# Patient Record
Sex: Female | Born: 1940 | Race: White | Hispanic: No | Marital: Married | Smoking: Never smoker
Health system: Southern US, Community
[De-identification: ages and names within clinical notes are randomized; demographics above are authoritative.]

## PROBLEM LIST (undated history)

## (undated) DIAGNOSIS — M353 Polymyalgia rheumatica: Secondary | ICD-10-CM

## (undated) DIAGNOSIS — Z8541 Personal history of malignant neoplasm of cervix uteri: Secondary | ICD-10-CM

## (undated) DIAGNOSIS — D539 Nutritional anemia, unspecified: Secondary | ICD-10-CM

## (undated) DIAGNOSIS — I70212 Atherosclerosis of native arteries of extremities with intermittent claudication, left leg: Secondary | ICD-10-CM

## (undated) DIAGNOSIS — N893 Dysplasia of vagina, unspecified: Secondary | ICD-10-CM

## (undated) DIAGNOSIS — I700 Atherosclerosis of aorta: Secondary | ICD-10-CM

## (undated) DIAGNOSIS — B977 Papillomavirus as the cause of diseases classified elsewhere: Secondary | ICD-10-CM

## (undated) DIAGNOSIS — M47812 Spondylosis without myelopathy or radiculopathy, cervical region: Secondary | ICD-10-CM

## (undated) DIAGNOSIS — I998 Other disorder of circulatory system: Secondary | ICD-10-CM

## (undated) DIAGNOSIS — H04129 Dry eye syndrome of unspecified lacrimal gland: Secondary | ICD-10-CM

## (undated) DIAGNOSIS — E079 Disorder of thyroid, unspecified: Secondary | ICD-10-CM

## (undated) DIAGNOSIS — K529 Noninfective gastroenteritis and colitis, unspecified: Secondary | ICD-10-CM

## (undated) DIAGNOSIS — Z7901 Long term (current) use of anticoagulants: Secondary | ICD-10-CM

## (undated) DIAGNOSIS — R7302 Impaired glucose tolerance (oral): Secondary | ICD-10-CM

## (undated) DIAGNOSIS — R7401 Elevation of levels of liver transaminase levels: Secondary | ICD-10-CM

## (undated) DIAGNOSIS — R911 Solitary pulmonary nodule: Secondary | ICD-10-CM

## (undated) DIAGNOSIS — C4490 Unspecified malignant neoplasm of skin, unspecified: Secondary | ICD-10-CM

## (undated) DIAGNOSIS — Z79899 Other long term (current) drug therapy: Secondary | ICD-10-CM

## (undated) DIAGNOSIS — E785 Hyperlipidemia, unspecified: Secondary | ICD-10-CM

## (undated) DIAGNOSIS — C44301 Unspecified malignant neoplasm of skin of nose: Secondary | ICD-10-CM

## (undated) DIAGNOSIS — M1990 Unspecified osteoarthritis, unspecified site: Secondary | ICD-10-CM

## (undated) DIAGNOSIS — E039 Hypothyroidism, unspecified: Secondary | ICD-10-CM

## (undated) DIAGNOSIS — M810 Age-related osteoporosis without current pathological fracture: Secondary | ICD-10-CM

## (undated) DIAGNOSIS — Z955 Presence of coronary angioplasty implant and graft: Secondary | ICD-10-CM

## (undated) DIAGNOSIS — Z7982 Long term (current) use of aspirin: Secondary | ICD-10-CM

## (undated) DIAGNOSIS — I70222 Atherosclerosis of native arteries of extremities with rest pain, left leg: Secondary | ICD-10-CM

## (undated) DIAGNOSIS — I70229 Atherosclerosis of native arteries of extremities with rest pain, unspecified extremity: Secondary | ICD-10-CM

## (undated) DIAGNOSIS — I739 Peripheral vascular disease, unspecified: Secondary | ICD-10-CM

## (undated) DIAGNOSIS — I2510 Atherosclerotic heart disease of native coronary artery without angina pectoris: Secondary | ICD-10-CM

## (undated) DIAGNOSIS — I872 Venous insufficiency (chronic) (peripheral): Secondary | ICD-10-CM

## (undated) HISTORY — PX: OTHER SURGICAL HISTORY: SHX169

## (undated) HISTORY — DX: Dry eye syndrome of unspecified lacrimal gland: H04.129

## (undated) HISTORY — DX: Personal history of malignant neoplasm of cervix uteri: Z85.41

## (undated) HISTORY — DX: Unspecified osteoarthritis, unspecified site: M19.90

## (undated) HISTORY — DX: Age-related osteoporosis without current pathological fracture: M81.0

## (undated) HISTORY — DX: Papillomavirus as the cause of diseases classified elsewhere: B97.7

## (undated) HISTORY — DX: Venous insufficiency (chronic) (peripheral): I87.2

## (undated) HISTORY — DX: Dysplasia of vagina, unspecified: N89.3

## (undated) HISTORY — PX: ABDOMINAL HYSTERECTOMY: SHX81

## (undated) HISTORY — DX: Presence of coronary angioplasty implant and graft: Z95.5

## (undated) HISTORY — DX: Unspecified malignant neoplasm of skin, unspecified: C44.90

## (undated) HISTORY — DX: Long term (current) use of anticoagulants: Z79.01

## (undated) HISTORY — DX: Other disorder of circulatory system: I99.8

## (undated) HISTORY — PX: LAPAROSCOPIC HYSTERECTOMY: SHX1926

## (undated) HISTORY — PX: TOTAL ABDOMINAL HYSTERECTOMY: SHX209

## (undated) HISTORY — DX: Polymyalgia rheumatica: M35.3

## (undated) HISTORY — PX: COLPOSCOPY: SHX161

## (undated) HISTORY — DX: Atherosclerosis of native arteries of extremities with rest pain, unspecified extremity: I70.229

## (undated) HISTORY — DX: Hyperlipidemia, unspecified: E78.5

## (undated) HISTORY — DX: Disorder of thyroid, unspecified: E07.9

## (undated) HISTORY — DX: Atherosclerotic heart disease of native coronary artery without angina pectoris: I25.10

## (undated) HISTORY — PX: KNEE ARTHROSCOPY: SUR90

## (undated) SURGERY — Surgical Case
Anesthesia: *Unknown

---

## 2015-04-17 DIAGNOSIS — K52832 Lymphocytic colitis: Secondary | ICD-10-CM | POA: Insufficient documentation

## 2015-04-17 DIAGNOSIS — K52831 Collagenous colitis: Secondary | ICD-10-CM | POA: Insufficient documentation

## 2015-04-17 DIAGNOSIS — M353 Polymyalgia rheumatica: Secondary | ICD-10-CM | POA: Insufficient documentation

## 2015-04-17 DIAGNOSIS — E785 Hyperlipidemia, unspecified: Secondary | ICD-10-CM | POA: Insufficient documentation

## 2015-04-17 DIAGNOSIS — M19011 Primary osteoarthritis, right shoulder: Secondary | ICD-10-CM | POA: Insufficient documentation

## 2015-04-26 DIAGNOSIS — Z8619 Personal history of other infectious and parasitic diseases: Secondary | ICD-10-CM | POA: Insufficient documentation

## 2015-04-26 DIAGNOSIS — Z820 Family history of epilepsy and other diseases of the nervous system: Secondary | ICD-10-CM | POA: Insufficient documentation

## 2015-06-26 DIAGNOSIS — R7302 Impaired glucose tolerance (oral): Secondary | ICD-10-CM | POA: Insufficient documentation

## 2015-06-26 DIAGNOSIS — R87610 Atypical squamous cells of undetermined significance on cytologic smear of cervix (ASC-US): Secondary | ICD-10-CM | POA: Insufficient documentation

## 2016-07-18 DIAGNOSIS — G479 Sleep disorder, unspecified: Secondary | ICD-10-CM | POA: Insufficient documentation

## 2016-09-20 DIAGNOSIS — M810 Age-related osteoporosis without current pathological fracture: Secondary | ICD-10-CM | POA: Insufficient documentation

## 2019-04-07 DIAGNOSIS — R55 Syncope and collapse: Secondary | ICD-10-CM | POA: Insufficient documentation

## 2019-04-07 DIAGNOSIS — E034 Atrophy of thyroid (acquired): Secondary | ICD-10-CM | POA: Insufficient documentation

## 2019-10-13 DIAGNOSIS — I70229 Atherosclerosis of native arteries of extremities with rest pain, unspecified extremity: Secondary | ICD-10-CM | POA: Insufficient documentation

## 2019-10-29 DIAGNOSIS — I771 Stricture of artery: Secondary | ICD-10-CM | POA: Insufficient documentation

## 2019-10-29 DIAGNOSIS — I70212 Atherosclerosis of native arteries of extremities with intermittent claudication, left leg: Secondary | ICD-10-CM | POA: Insufficient documentation

## 2019-11-09 DIAGNOSIS — T8131XA Disruption of external operation (surgical) wound, not elsewhere classified, initial encounter: Secondary | ICD-10-CM | POA: Insufficient documentation

## 2020-03-24 DIAGNOSIS — S85002S Unspecified injury of popliteal artery, left leg, sequela: Secondary | ICD-10-CM | POA: Insufficient documentation

## 2020-03-24 DIAGNOSIS — N893 Dysplasia of vagina, unspecified: Secondary | ICD-10-CM | POA: Insufficient documentation

## 2020-04-06 ENCOUNTER — Telehealth: Payer: Self-pay | Admitting: *Deleted

## 2020-04-06 ENCOUNTER — Encounter: Payer: Self-pay | Admitting: *Deleted

## 2020-04-06 NOTE — Telephone Encounter (Signed)
Left vm for NEW patient to request for a return phone call. Patient has been scheduled for 7/28 at 9:30 am with [facility].

## 2020-04-07 ENCOUNTER — Encounter: Payer: Self-pay | Admitting: *Deleted

## 2020-04-07 NOTE — Telephone Encounter (Signed)
Spoke with patient. She is a transfer of care from [place]. Pt resides at [facility] with her husband. Apt given to see Dr. Johnnette Litter on 7/28 at 9:30am.   She has a personal history of cervical cancer and VAIN. She stated that she will have her records transferred to Dr. Sampson Goon. I have asked the patient to have a consent signed as well for her release of records.

## 2020-04-19 DIAGNOSIS — E039 Hypothyroidism, unspecified: Secondary | ICD-10-CM | POA: Insufficient documentation

## 2020-04-19 DIAGNOSIS — I739 Peripheral vascular disease, unspecified: Secondary | ICD-10-CM | POA: Insufficient documentation

## 2020-04-19 DIAGNOSIS — E785 Hyperlipidemia, unspecified: Secondary | ICD-10-CM | POA: Insufficient documentation

## 2020-04-19 NOTE — Progress Notes (Signed)
MRN : [id number]  Melanie Browning is a 79 y.o. (14-Nov-1940) female who presents with chief complaint of No chief complaint on file. Marland Kitchen  History of Present Illness:   She and her husband moved to [facility] recently and she is establishing care here.  The patient is seen for evaluation of painful lower extremities and diminished pulses. Patient notes the pain is always associated with activity and is very consistent day today. Typically, the pain occurs at less than one block, progress is as activity continues to the point that the patient must stop walking. Resting including standing still for several minutes allowed resumption of the activity and the ability to walk a similar distance before stopping again. Uneven terrain and inclined shorten the distance. The pain has been progressive over the past several years. The patient states the inability to walk is now having a profound negative impact on quality of life and daily activities.  She is s/p repair of her left popliteal artery after a failed angioplasty while she was living in [facility].  The patient denies rest pain or dangling of an extremity off the side of the bed during the night for relief. No open wounds or sores at this time. No prior interventions or surgeries.  No history of back problems or DJD of the lumbar sacral spine.   The patient denies changes in claudication symptoms or new rest pain symptoms.  No new ulcers or wounds of the foot.  The patient's blood pressure has been stable and relatively well controlled. The patient denies amaurosis fugax or recent TIA symptoms. There are no recent neurological changes noted. The patient denies history of DVT, PE or superficial thrombophlebitis. The patient denies recent episodes of angina or shortness of breath.   No outpatient medications have been marked as taking for the 04/20/20 encounter (Appointment) with Gilda Crease, Latina Craver, MD.    Past Medical History:  Diagnosis  Date  . Cervical cancer (HCC)   . HPV (human papilloma virus) infection   . Hyperlipidemia   . Osteoporosis   . Polymyalgia rheumatica (HCC)   . Thyroid disease   . Vaginal dysplasia       Social History Social History   Tobacco Use  . Smoking status: Not on file  Substance Use Topics  . Alcohol use: Not on file  . Drug use: Not on file    Family History No family history of bleeding/clotting disorders, porphyria or autoimmune disease   Not on File   REVIEW OF SYSTEMS (Negative unless checked)  Constitutional: [] Weight loss  [] Fever  [] Chills Cardiac: [] Chest pain   [] Chest pressure   [] Palpitations   [] Shortness of breath when laying flat   [] Shortness of breath with exertion. Vascular:  [x] Pain in legs with walking   [] Pain in legs at rest  [] History of DVT   [] Phlebitis   [] Swelling in legs   [] Varicose veins   [] Non-healing ulcers Pulmonary:   [] Uses home oxygen   [] Productive cough   [] Hemoptysis   [] Wheeze  [] COPD   [] Asthma Neurologic:  [] Dizziness   [] Seizures   [] History of stroke   [] History of TIA  [] Aphasia   [] Vissual changes   [] Weakness or numbness in arm   [] Weakness or numbness in leg Musculoskeletal:   [] Joint swelling   [] Joint pain   [] Low back pain Hematologic:  [] Easy bruising  [] Easy bleeding   [] Hypercoagulable state   [] Anemic Gastrointestinal:  [] Diarrhea   [] Vomiting  [] Gastroesophageal reflux/heartburn   [] Difficulty  swallowing. Genitourinary:  [] Chronic kidney disease   [] Difficult urination  [] Frequent urination   [] Blood in urine Skin:  [] Rashes   [] Ulcers  Psychological:  [] History of anxiety   []  History of major depression.  Physical Examination  There were no vitals filed for this visit. There is no height or weight on file to calculate BMI. Gen: WD/WN, NAD Head: [place]/AT, No temporalis wasting.  Ear/Nose/Throat: Hearing grossly intact, nares w/o erythema or drainage, poor dentition Eyes: PER, EOMI, sclera nonicteric.  Neck: Supple,  no masses.  No bruit or JVD.  Pulmonary:  Good air movement, clear to auscultation bilaterally, no use of accessory muscles.  Cardiac: RRR, normal S1, S2, no Murmurs. Vascular: scattered moderate varicosities present bilaterally.  Moderate to severe Lt > Rt venous stasis changes to the legs bilaterally.  1+ soft pitting edema. Vessel Right Left  Radial Palpable Palpable  PT Palpable Not Palpable  DP Palpable Trace Palpable  Gastrointestinal: soft, non-distended. No guarding/no peritoneal signs.  Musculoskeletal: M/S 5/5 throughout.  No deformity or atrophy.  Neurologic: CN 2-12 intact. Pain and light touch intact in extremities.  Symmetrical.  Speech is fluent. Motor exam as listed above. Psychiatric: Judgment intact, Mood & affect appropriate for pt's clinical situation. Dermatologic: Moderate venous rashes no ulcers noted.  No changes consistent with cellulitis.   CBC No results found for: WBC, HGB, HCT, MCV, PLT  BMET No results found for: NA, K, CL, CO2, GLUCOSE, BUN, CREATININE, CALCIUM, GFRNONAA, GFRAA CrCl cannot be calculated (No successful lab value found.).  COAG No results found for: INR, PROTIME  Radiology No results found.    Assessment/Plan 1. 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  The risks and benefits as well as the alternatives were discussed in detail with the patient.  All questions were answered.  Patient agrees to proceed and understands this could be a prelude to angiography and intervention.  The patient will follow up with me in the office to review the studies.   - VAS [place] LOWER EXTREMITY ARTERIAL DUPLEX; Future - VAS [place] ABI WITH/WO TBI; Future  2. Chronic venous insufficiency No surgery or intervention at this point in time.    I have had a long discussion with the patient regarding  venous insufficiency and why it  causes symptoms. I have discussed with the patient the chronic skin changes that accompany venous insufficiency and the long term sequela such as infection and ulceration.  Patient will begin wearing graduated compression stockings class 1 (20-30 mmHg) or compression wraps on a daily basis a prescription was given. The patient will put the stockings on first thing in the morning and removing them in the evening. The patient is instructed specifically not to sleep in the stockings.    In addition, behavioral modification including several periods of elevation of the lower extremities during the day will be continued. I have demonstrated that proper elevation is a position with the ankles at heart level.  The patient is instructed to begin routine exercise, especially walking on a daily basis  3. Mixed hyperlipidemia Continue statin as ordered and reviewed, no changes at this time   4. Hypothyroidism, unspecified type Continue hormone replacement medications as already ordered, these medications have been reviewed and there are no changes at this time.     Levora Dredge, MD  04/19/2020 4:03 PM

## 2020-04-20 ENCOUNTER — Ambulatory Visit (INDEPENDENT_AMBULATORY_CARE_PROVIDER_SITE_OTHER): Payer: Medicare Other | Admitting: Vascular Surgery

## 2020-04-20 ENCOUNTER — Other Ambulatory Visit: Payer: Self-pay

## 2020-04-20 ENCOUNTER — Encounter (INDEPENDENT_AMBULATORY_CARE_PROVIDER_SITE_OTHER): Payer: Self-pay | Admitting: Vascular Surgery

## 2020-04-20 VITALS — BP 130/69 | HR 73 | Resp 17 | Ht 68.0 in | Wt 147.0 lb

## 2020-04-20 DIAGNOSIS — E039 Hypothyroidism, unspecified: Secondary | ICD-10-CM | POA: Diagnosis not present

## 2020-04-20 DIAGNOSIS — I70212 Atherosclerosis of native arteries of extremities with intermittent claudication, left leg: Secondary | ICD-10-CM

## 2020-04-20 DIAGNOSIS — E782 Mixed hyperlipidemia: Secondary | ICD-10-CM | POA: Diagnosis not present

## 2020-04-20 DIAGNOSIS — I872 Venous insufficiency (chronic) (peripheral): Secondary | ICD-10-CM

## 2020-04-23 ENCOUNTER — Encounter (INDEPENDENT_AMBULATORY_CARE_PROVIDER_SITE_OTHER): Payer: Self-pay | Admitting: Vascular Surgery

## 2020-04-23 DIAGNOSIS — I872 Venous insufficiency (chronic) (peripheral): Secondary | ICD-10-CM | POA: Insufficient documentation

## 2020-04-25 NOTE — Progress Notes (Signed)
GYN ONC Consult [facility]  Telephone:([phone] [phone]  Patient Care Team: Mick Sell, MD as PCP - General (Infectious Diseases)   Name of the patient: Melanie Browning  [id number]  06-21-1941   Date of visit: 04/26/2020   Gynecologic Oncology Consult Visit   Referring Provider: Dr. Sampson Goon   Chief Concern: Vaginal Dysplasia  Subjective:  Melanie Browning is a 79 y.o. G1P1 female who is seen in consultation from Dr. Sampson Goon for vaginal dysplasia. She is s/p TAH-BSO on 09/05/2015, but final surgical path was negative for dysplasia. After surgery, she was closely monitored every 6 months with colposcopies and PAPs.   Today she reports ongoing vaginal odor.   Gynecology Oncology History  She was treated in [place] for cervical and vaginal dysplasia from 2016 to 08/2019. She was referred to our clinic after her last visit at [facility] in [place].   TAH-BSO 09/05/2015  PAP 08/2016 LGSIL  PAP 02/09/2017 HSIL Colposcopy 02/2017 VAIN 1 PAP 05/2017 NILM 10/30/2017 ASCUS, HPV 16+ Colposcopy 11/26/2017 VAIN 1 Colposcopy 03/17/2018 No obvious disease noted (as per note from Dr. Pernell Dupre on 09/28/2019)  PAP 03/25/2019 LSIL/HPV+ Colposcopy with biopsy 04/20/2019 VAIN 1  PAP 09/21/2019, patient reports VAIN 1, HPV+   Problem List: Patient Active Problem List   Diagnosis Date Noted  . Chronic venous insufficiency 04/23/2020  . PAD (peripheral artery disease) (HCC) 04/19/2020  . Hyperlipidemia 04/19/2020  . Hypothyroidism 04/19/2020  . Popliteal artery injury, left, 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  . 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 Disruption of external operation (surgical) wound, not elsewhere classified, initial encounter 11/09/2019  . 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 Stricture, artery (HCC) 10/29/2019  . Critical lower limb ischemia 10/13/2019  . Hypothyroidism due  to acquired atrophy of thyroid 04/07/2019  . Syncope and collapse 04/07/2019  . Senile osteoporosis 09/20/2016  . Sleep disorder 07/18/2016  . IGT (impaired glucose tolerance) 06/26/2015  . Pap smear abnormality of cervix with ASCUS favoring dysplasia 06/26/2015  . Family history of Guillain-Barre syndrome 04/26/2015  . History of herpes genitalis 04/26/2015  . History of HPV infection 04/26/2015  . Dyslipidemia 04/17/2015  . 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 PMR (polymyalgia rheumatica) (HCC) 04/17/2015  . Arthritis of shoulder region, 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   Past Medical History: Past Medical History:  Diagnosis Date  . Cervical cancer (HCC)   . HPV (human papilloma virus) infection   . Hyperlipidemia   . Osteoporosis   . Polymyalgia rheumatica (HCC)   . Thyroid disease   . Vaginal dysplasia    Past Surgical History: Past Surgical History:  Procedure Laterality Date  . colposcobpy    . knee athroscopy Left   . LAPAROSCOPIC HYSTERECTOMY     Past Gynecologic History:  Menarche: 14 Menstrual details: Lasts 3 days Menses regular: yes Last Menstrual Period: Unknown History of OCP/HRT use: No History of Abnormal pap: yes, as per HPI Last pap: 08/2019 History of STDs: The patient reports a past history of: herpes. Sexually active: not asked  OB History:  OB History  No obstetric history on file.   Family History: Family History  Problem Relation Age of Onset  . Cancer Mother   . Heart disease Father    Social History: Social History   Socioeconomic History  . Marital status: Married    Spouse name: Not on file  . Number of children: Not on file  . Years of education: Not on file  .  Highest education level: Not on file  Occupational History  . Not on file  Tobacco Use  . Smoking status: Never Smoker  . Smokeless tobacco: Never Used  Substance and Sexual Activity  . Alcohol use: Not on file  . Drug use: Not on file  . Sexual activity: Not on file  Other  Topics Concern  . Not on file  Social History Narrative  . Not on file   Social Determinants of Health   Financial Resource Strain:   . Difficulty of Paying Living Expenses:   Food Insecurity:   . Worried About Programme researcher, broadcasting/film/video in the Last Year:   . Barista in the Last Year:   Transportation Needs:   . Freight forwarder (Medical):   Marland Kitchen Lack of Transportation (Non-Medical):   Physical Activity:   . Days of Exercise per Week:   . Minutes of Exercise per Session:   Stress:   . Feeling of Stress :   Social Connections:   . Frequency of Communication with Friends and Family:   . Frequency of Social Gatherings with Friends and Family:   . Attends Religious Services:   . Active Member of Clubs or Organizations:   . Attends Banker Meetings:   Marland Kitchen Marital Status:   Intimate Partner Violence:   . Fear of Current or Ex-Partner:   . Emotionally Abused:   Marland Kitchen Physically Abused:   . Sexually Abused:     Allergies: Allergies  Allergen Reactions  . Lactose     Other reaction(s): GI Upset    Current Medications: Current Outpatient Medications  Medication Sig Dispense Refill  . ascorbic acid (VITAMIN C) 1000 MG tablet Take by mouth.    Marland Kitchen atorvastatin (LIPITOR) 40 MG tablet Take by mouth.    . B Complex Vitamins (VITAMIN B COMPLEX) TABS Take by mouth.    Marland Kitchen BABY ASPIRIN PO Take by mouth.    . Budesonide ER 9 MG CP24 Take by mouth.    . Cholecalciferol 125 MCG (5000 UT) capsule Take by mouth.    . diphenoxylate-atropine (LOMOTIL) 2.5-0.025 MG/5ML liquid Take by mouth.    . gabapentin (NEURONTIN) 100 MG capsule Take by mouth.    . levothyroxine (SYNTHROID) 75 MCG tablet Take by mouth.    Marland Kitchen Lifitegrast (XIIDRA) 5 % SOLN Apply to eye.    . Naproxen Sodium 220 MG CAPS Take by mouth.    . Zinc Sulfate (ZINC 15 PO) Take 15 mg/day by mouth.     No current facility-administered medications for this visit.    Review of Systems General: positive for 7 pound  weight gain over 1 month, negative for, fevers, chills, fatigue, changes in sleep Skin: negative for changes in color, texture, moles or lesions Eyes: negative for, changes in vision, pain, diplopia HEENT: negative for, change in hearing, pain, discharge, tinnitus, vertigo, voice changes, sore throat, neck masses Breasts: negative for breast lumps Pulmonary: negative for, dyspnea, orthopnea, productive cough Cardiac: negative for, palpitations, syncope, pain, discomfort, pressure Gastrointestinal: negative for, dysphagia, nausea, vomiting, jaundice, pain, constipation, diarrhea, hematemesis, hematochezia Genitourinary/Sexual: negative for, dysuria, discharge, hesitancy, nocturia, retention, stones, infections, STD's, incontinence Ob/Gyn: negative for, irregular bleeding, pain Musculoskeletal: negative for, pain, stiffness, swelling, range of motion limitation Hematology: negative for, easy bruising, bleeding; positive for vaginal odor, reports using Replense  Neurologic/Psych: negative for, headaches, seizures, paralysis, weakness, tremor, change in gait, change in sensation, mood swings, depression, anxiety, change in memory  Objective:  Physical Examination:  BP (!) 133/82   Pulse 64   Temp 98.1 F (36.7 C) (Oral)   Resp 16   Wt 61.2 kg   BMI 20.53 kg/m    ECOG Performance Status: 1 - Symptomatic but completely ambulatory  General appearance: alert, cooperative and appears stated age HEENT:PERRLA and neck supple with midline trachea Lymph node survey: non-palpable, axillary, inguinal, supraclavicular Cardiovascular: regular rate and rhythm, no murmurs or gallops Respiratory: normal air entry, lungs clear to auscultation Breast exam: not examined Abdomen: soft, non-tender, without masses or organomegaly, no hernias and well healed incision Back: inspection of back is normal Extremities: extremities normal, atraumatic, no cyanosis or edema and healing popliteal vascular surgery  present over Left posterior knee Skin exam - normal coloration and turgor, no rashes, no suspicious skin lesions noted. Neurological exam reveals alert, oriented, normal speech, no focal findings or movement disorder noted.  Pelvic: exam chaperoned by nurse;  Vulva: atrophic normal appearing vulva with no masses, tenderness or lesions; Vagina: atrophic normal vagina; Bimanual: no masses. Rectal: deferred  Procedure" Colposcopy done with acetic acid after consent signed.  Atrophic vagina with no lesions seen.     Assessment:  Melanie Browning is a 79 y.o. 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 Asymptomatic and no lesions seen today on colposcopy.   TAH-BSO 09/05/2015  Medical co-morbidities complicating care: PAD, Dyslipidemia  Plan:   Problem List Items Addressed This Visit      Genitourinary   Pap smear abnormality of cervix with ASCUS favoring dysplasia - Primary     We discussed options for management including surveillance with HPV/PAP.  Suggested return to clinic in 6 months.    The patient's diagnosis, an outline of the further diagnostic and laboratory studies which will be required, the recommendation, and alternatives were discussed.  All questions were answered to the patient's satisfaction.  A total of 40 minutes were spent with the patient/family today; 50% was spent in education, counseling and coordination of care for VAIN.    Leida Lauth, MD

## 2020-04-26 ENCOUNTER — Encounter: Payer: Self-pay | Admitting: Obstetrics and Gynecology

## 2020-04-26 ENCOUNTER — Inpatient Hospital Stay: Payer: Medicare Other | Attending: Obstetrics and Gynecology | Admitting: Obstetrics and Gynecology

## 2020-04-26 ENCOUNTER — Other Ambulatory Visit: Payer: Self-pay

## 2020-04-26 VITALS — BP 133/82 | HR 64 | Temp 98.1°F | Resp 16 | Wt 135.0 lb

## 2020-04-26 DIAGNOSIS — Z90710 Acquired absence of both cervix and uterus: Secondary | ICD-10-CM | POA: Diagnosis not present

## 2020-04-26 DIAGNOSIS — N893 Dysplasia of vagina, unspecified: Secondary | ICD-10-CM | POA: Diagnosis not present

## 2020-04-26 DIAGNOSIS — Z90722 Acquired absence of ovaries, bilateral: Secondary | ICD-10-CM | POA: Diagnosis not present

## 2020-04-26 DIAGNOSIS — Z79899 Other long term (current) drug therapy: Secondary | ICD-10-CM | POA: Diagnosis not present

## 2020-04-26 DIAGNOSIS — I739 Peripheral vascular disease, unspecified: Secondary | ICD-10-CM | POA: Insufficient documentation

## 2020-04-26 DIAGNOSIS — E034 Atrophy of thyroid (acquired): Secondary | ICD-10-CM | POA: Diagnosis not present

## 2020-04-26 DIAGNOSIS — I872 Venous insufficiency (chronic) (peripheral): Secondary | ICD-10-CM | POA: Insufficient documentation

## 2020-04-26 DIAGNOSIS — E785 Hyperlipidemia, unspecified: Secondary | ICD-10-CM | POA: Insufficient documentation

## 2020-04-26 DIAGNOSIS — M353 Polymyalgia rheumatica: Secondary | ICD-10-CM | POA: Insufficient documentation

## 2020-04-26 NOTE — Progress Notes (Signed)
Vaginal odor

## 2020-05-08 ENCOUNTER — Encounter (INDEPENDENT_AMBULATORY_CARE_PROVIDER_SITE_OTHER): Payer: Self-pay

## 2020-05-11 ENCOUNTER — Other Ambulatory Visit: Payer: Self-pay

## 2020-05-11 ENCOUNTER — Ambulatory Visit (INDEPENDENT_AMBULATORY_CARE_PROVIDER_SITE_OTHER): Payer: Medicare Other

## 2020-05-11 ENCOUNTER — Ambulatory Visit (INDEPENDENT_AMBULATORY_CARE_PROVIDER_SITE_OTHER): Payer: Medicare Other | Admitting: Vascular Surgery

## 2020-05-11 ENCOUNTER — Encounter (INDEPENDENT_AMBULATORY_CARE_PROVIDER_SITE_OTHER): Payer: Self-pay | Admitting: Vascular Surgery

## 2020-05-11 VITALS — BP 150/77 | HR 64 | Resp 16 | Wt 150.4 lb

## 2020-05-11 DIAGNOSIS — I872 Venous insufficiency (chronic) (peripheral): Secondary | ICD-10-CM | POA: Diagnosis not present

## 2020-05-11 DIAGNOSIS — E782 Mixed hyperlipidemia: Secondary | ICD-10-CM | POA: Diagnosis not present

## 2020-05-11 DIAGNOSIS — I70212 Atherosclerosis of native arteries of extremities with intermittent claudication, left leg: Secondary | ICD-10-CM

## 2020-05-11 DIAGNOSIS — S85002S Unspecified injury of popliteal artery, left leg, sequela: Secondary | ICD-10-CM | POA: Diagnosis not present

## 2020-05-19 ENCOUNTER — Encounter (INDEPENDENT_AMBULATORY_CARE_PROVIDER_SITE_OTHER): Payer: Self-pay | Admitting: Vascular Surgery

## 2020-05-19 NOTE — Progress Notes (Signed)
MRN : [id number]  Melanie Browning is a 79 y.o. (12/03/1940) female who presents with chief complaint of  Chief Complaint  Patient presents with  . Follow-up    ultrasound follow up  .  History of Present Illness:   The patient returns to the office for followup and review of the noninvasive studies. There have been no interval changes in lower extremity symptoms. No interval shortening of the patient's claudication distance or development of rest pain symptoms. No new ulcers or wounds have occurred since the last visit.  There have been no significant changes to the patient's overall health care.  The patient denies amaurosis fugax or recent TIA symptoms. There are no recent neurological changes noted. The patient denies history of DVT, PE or superficial thrombophlebitis. The patient denies recent episodes of angina or shortness of breath.   ABI Rt=1.39 and Lt=1.26   Duplex ultrasound of the right leg shows widely patent arterial system no restenosis at the popliteal level  Current Meds  Medication Sig  . ascorbic acid (VITAMIN C) 1000 MG tablet Take 3,000 mg by mouth daily.   Marland Kitchen atorvastatin (LIPITOR) 40 MG tablet Take 40 mg by mouth daily.   . B Complex Vitamins (VITAMIN B COMPLEX) TABS Take 1 tablet by mouth daily.   Marland Kitchen BABY ASPIRIN PO Take 1 tablet by mouth daily.   . Budesonide ER 9 MG CP24 Take 1 tablet by mouth daily as needed.   . Cholecalciferol 125 MCG (5000 UT) capsule Take 5,000 Units by mouth daily.   Marland Kitchen denosumab (PROLIA) 60 MG/ML SOSY injection Inject 60 mg into the skin every 6 (six) months.  . diphenoxylate-atropine (LOMOTIL) 2.5-0.025 MG/5ML liquid Take 5 mLs by mouth 4 (four) times daily as needed.   . gabapentin (NEURONTIN) 100 MG capsule Take 100 mg by mouth 2 (two) times daily.   Marland Kitchen levothyroxine (SYNTHROID) 75 MCG tablet Take 75 mcg by mouth daily before breakfast.   . Lifitegrast (XIIDRA) 5 % SOLN Apply 1 drop to eye daily as needed.   . Naproxen Sodium 220 MG  CAPS Take 1 capsule by mouth daily as needed.   . Zinc Sulfate (ZINC 15 PO) Take 30 mg/day by mouth.     Past Medical History:  Diagnosis Date  . Arthritis   . Dry eye    bilateral when living in [facility]  . HPV (human papilloma virus) infection   . Hyperlipidemia   . Ischemia of left lower extremity   . Osteoporosis   . Polymyalgia rheumatica (HCC)   . Thyroid disease   . Vaginal dysplasia     Past Surgical History:  Procedure Laterality Date  . ABDOMINAL HYSTERECTOMY     complete  . colposcobpy    . knee athroscopy Left   . LAPAROSCOPIC HYSTERECTOMY      Social History Social History   Tobacco Use  . Smoking status: Never Smoker  . Smokeless tobacco: Never Used  Vaping Use  . Vaping Use: Never used  Substance Use Topics  . Alcohol use: Yes    Alcohol/week: 7.0 standard drinks    Types: 7 Glasses of wine per week    Comment: 1 glass of wine every day  . Drug use: Never    Family History Family History  Problem Relation Age of Onset  . Cancer Mother   . Heart disease Father     Allergies  Allergen Reactions  . Lactose     Other reaction(s): GI Upset Gi upset  and diarrhea Gi upset and diarrhea      REVIEW OF SYSTEMS (Negative unless checked)  Constitutional: [] Weight loss  [] Fever  [] Chills Cardiac: [] Chest pain   [] Chest pressure   [] Palpitations   [] Shortness of breath when laying flat   [] Shortness of breath with exertion. Vascular:  [] Pain in legs with walking   [] Pain in legs at rest  [] History of DVT   [] Phlebitis   [] Swelling in legs   [] Varicose veins   [] Non-healing ulcers Pulmonary:   [] Uses home oxygen   [] Productive cough   [] Hemoptysis   [] Wheeze  [] COPD   [] Asthma Neurologic:  [] Dizziness   [] Seizures   [] History of stroke   [] History of TIA  [] Aphasia   [] Vissual changes   [] Weakness or numbness in arm   [] Weakness or numbness in leg Musculoskeletal:   [] Joint swelling   [x] Joint pain   [] Low back pain Hematologic:  [] Easy bruising   [] Easy bleeding   [] Hypercoagulable state   [] Anemic Gastrointestinal:  [] Diarrhea   [] Vomiting  [] Gastroesophageal reflux/heartburn   [] Difficulty swallowing. Genitourinary:  [] Chronic kidney disease   [] Difficult urination  [] Frequent urination   [] Blood in urine Skin:  [] Rashes   [] Ulcers  Psychological:  [] History of anxiety   []  History of major depression.  Physical Examination  Vitals:   05/11/20 1436  BP: (!) 150/77  Pulse: 64  Resp: 16  Weight: 150 lb 6.4 oz (68.2 kg)   Body mass index is 22.87 kg/m. Gen: WD/WN, NAD Head: [place]/AT, No temporalis wasting.  Ear/Nose/Throat: Hearing grossly intact, nares w/o erythema or drainage Eyes: PER, EOMI, sclera nonicteric.  Neck: Supple, no large masses.   Pulmonary:  Good air movement, no audible wheezing bilaterally, no use of accessory muscles.  Cardiac: RRR, no JVD Vascular:  Vessel Right Left  Radial Palpable Palpable  PT Palpable Palpable  DP Palpable Palpable  Gastrointestinal: Non-distended. No guarding/no peritoneal signs.  Musculoskeletal: M/S 5/5 throughout.  No deformity or atrophy.  Neurologic: CN 2-12 intact. Symmetrical.  Speech is fluent. Motor exam as listed above. Psychiatric: Judgment intact, Mood & affect appropriate for pt's clinical situation. Dermatologic: No rashes or ulcers noted.  No changes consistent with cellulitis. Lymph : No lichenification or skin changes of chronic lymphedema.  CBC No results found for: WBC, HGB, HCT, MCV, PLT  BMET No results found for: NA, K, CL, CO2, GLUCOSE, BUN, CREATININE, CALCIUM, GFRNONAA, GFRAA CrCl cannot be calculated (No successful lab value found.).  COAG No results found for: INR, PROTIME  Radiology No results found.   Assessment/Plan 1. 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  The patient does not voice  lifestyle limiting changes at this point in time.  Noninvasive studies do not suggest clinically significant change.  No invasive studies, angiography or surgery at this time The patient should continue walking and begin a more formal exercise program.  The patient should continue antiplatelet therapy and aggressive treatment of the lipid abnormalities  No changes in the patient's medications at this time  - VAS [place] ABI WITH/WO TBI; Future  2. Popliteal artery injury, left, sequela  Recommend:  The patient has evidence of atherosclerosis of the lower extremities with claudication.  The patient does not voice lifestyle limiting changes at this point in time.  Noninvasive studies do not suggest clinically significant change.  No invasive studies, angiography or surgery at this time The  patient should continue walking and begin a more formal exercise program.  The patient should continue antiplatelet therapy and aggressive treatment of the lipid abnormalities  No changes in the patient's medications at this time  3. Mixed hyperlipidemia Continue statin as ordered and reviewed, no changes at this time   4. Chronic venous insufficiency No surgery or intervention at this point in time.  I have reviewed my discussion with the patient regarding venous insufficiency and why it causes symptoms. I have discussed with the patient the chronic skin changes that accompany venous insufficiency and the long term sequela such as ulceration. Patient will contnue wearing graduated compression stockings on a daily basis, as this has provided excellent control of his edema. The patient will put the stockings on first thing in the morning and removing them in the evening. The patient is reminded not to sleep in the stockings.  In addition, behavioral modification including elevation during the day will be initiated. Exercise is strongly encouraged.      Levora Dredge, MD  05/19/2020 5:16 PM

## 2020-07-27 ENCOUNTER — Other Ambulatory Visit (INDEPENDENT_AMBULATORY_CARE_PROVIDER_SITE_OTHER): Payer: Self-pay | Admitting: Vascular Surgery

## 2020-07-27 MED ORDER — LEVOFLOXACIN 500 MG PO TABS: 500.0000 mg | ORAL_TABLET | Freq: Every day | ORAL | 0 refills | Status: DC

## 2020-08-21 ENCOUNTER — Telehealth (INDEPENDENT_AMBULATORY_CARE_PROVIDER_SITE_OTHER): Payer: Self-pay

## 2020-08-21 NOTE — Telephone Encounter (Signed)
Janci nurse with [facility] left voicemail stating that the patient sent a email stating informing that she is having pain at left popliteal artery and having much pain while walking. Patient is requesting to be seen since she has had serious recurring medical for either 12 or 12/3. I spoke with Dr Gilda Crease and advise for the patient to come in for left arterial see himself or fallon. Patient has been schedule and the nurse will inform the patient with appointment.

## 2020-08-29 ENCOUNTER — Other Ambulatory Visit (INDEPENDENT_AMBULATORY_CARE_PROVIDER_SITE_OTHER): Payer: Self-pay | Admitting: Vascular Surgery

## 2020-08-29 DIAGNOSIS — M79605 Pain in left leg: Secondary | ICD-10-CM

## 2020-08-31 ENCOUNTER — Ambulatory Visit (INDEPENDENT_AMBULATORY_CARE_PROVIDER_SITE_OTHER): Payer: Medicare Other

## 2020-08-31 DIAGNOSIS — M79605 Pain in left leg: Secondary | ICD-10-CM | POA: Diagnosis not present

## 2020-09-01 ENCOUNTER — Other Ambulatory Visit
Admission: RE | Admit: 2020-09-01 | Discharge: 2020-09-01 | Disposition: A | Discharge status: Home / Self Care | Payer: Medicare Other | Source: Ambulatory Visit | Attending: Vascular Surgery | Admitting: Vascular Surgery

## 2020-09-01 ENCOUNTER — Other Ambulatory Visit: Payer: Self-pay

## 2020-09-01 ENCOUNTER — Telehealth (INDEPENDENT_AMBULATORY_CARE_PROVIDER_SITE_OTHER): Payer: Self-pay

## 2020-09-01 DIAGNOSIS — Z20822 Contact with and (suspected) exposure to covid-19: Secondary | ICD-10-CM | POA: Diagnosis not present

## 2020-09-01 DIAGNOSIS — Z01812 Encounter for preprocedural laboratory examination: Secondary | ICD-10-CM | POA: Insufficient documentation

## 2020-09-01 NOTE — Telephone Encounter (Signed)
Spoke with the patient and she is scheduled with Dr. Gilda Crease for a left leg angio with intervention on 09/05/20 with a 6:45 am arrival time to the [facility]. Covid testing is 09/01/20 between 8-1 pm at the [facility]. Pre-procedure instructions were discussed.

## 2020-09-02 LAB — SARS CORONAVIRUS 2 (TAT 6-24 HRS): SARS Coronavirus 2: NEGATIVE

## 2020-09-05 ENCOUNTER — Other Ambulatory Visit: Payer: Self-pay

## 2020-09-05 ENCOUNTER — Other Ambulatory Visit (INDEPENDENT_AMBULATORY_CARE_PROVIDER_SITE_OTHER): Payer: Self-pay | Admitting: Nurse Practitioner

## 2020-09-05 ENCOUNTER — Ambulatory Visit
Admission: RE | Admit: 2020-09-05 | Discharge: 2020-09-05 | Disposition: A | Discharge status: Home / Self Care | Payer: Medicare Other | Attending: Vascular Surgery | Admitting: Vascular Surgery

## 2020-09-05 ENCOUNTER — Encounter: Payer: Self-pay | Admitting: Vascular Surgery

## 2020-09-05 ENCOUNTER — Encounter
Admission: RE | Disposition: A | Discharge status: Home / Self Care | Payer: Self-pay | Source: Home / Self Care | Attending: Vascular Surgery

## 2020-09-05 DIAGNOSIS — I70219 Atherosclerosis of native arteries of extremities with intermittent claudication, unspecified extremity: Secondary | ICD-10-CM | POA: Diagnosis not present

## 2020-09-05 DIAGNOSIS — Z7982 Long term (current) use of aspirin: Secondary | ICD-10-CM | POA: Diagnosis not present

## 2020-09-05 DIAGNOSIS — Z79890 Hormone replacement therapy (postmenopausal): Secondary | ICD-10-CM | POA: Insufficient documentation

## 2020-09-05 DIAGNOSIS — I70213 Atherosclerosis of native arteries of extremities with intermittent claudication, bilateral legs: Secondary | ICD-10-CM | POA: Insufficient documentation

## 2020-09-05 DIAGNOSIS — Z79899 Other long term (current) drug therapy: Secondary | ICD-10-CM | POA: Insufficient documentation

## 2020-09-05 DIAGNOSIS — E079 Disorder of thyroid, unspecified: Secondary | ICD-10-CM | POA: Insufficient documentation

## 2020-09-05 DIAGNOSIS — I872 Venous insufficiency (chronic) (peripheral): Secondary | ICD-10-CM | POA: Insufficient documentation

## 2020-09-05 DIAGNOSIS — E782 Mixed hyperlipidemia: Secondary | ICD-10-CM | POA: Diagnosis not present

## 2020-09-05 DIAGNOSIS — I70212 Atherosclerosis of native arteries of extremities with intermittent claudication, left leg: Secondary | ICD-10-CM

## 2020-09-05 HISTORY — PX: LOWER EXTREMITY ANGIOGRAPHY: CATH118251

## 2020-09-05 LAB — BUN: BUN: 18 mg/dL (ref 8–23)

## 2020-09-05 LAB — CREATININE, SERUM
Creatinine, Ser: 0.61 mg/dL (ref 0.44–1.00)
GFR, Estimated: >60 mL/min (ref >60)

## 2020-09-05 SURGERY — LOWER EXTREMITY ANGIOGRAPHY
Anesthesia: Moderate Sedation | Laterality: Left

## 2020-09-05 MED ORDER — CLOPIDOGREL BISULFATE 75 MG PO TABS: 75.0000 mg | ORAL_TABLET | Freq: Every day | ORAL | 5 refills | Status: DC

## 2020-09-05 MED ORDER — FENTANYL CITRATE (PF) 100 MCG/2ML IJ SOLN
INTRAMUSCULAR | Status: AC
  Filled 2020-09-05: qty 2

## 2020-09-05 MED ORDER — OXYCODONE HCL 5 MG PO TABS: 5.0000 mg | ORAL_TABLET | ORAL | Status: DC | PRN

## 2020-09-05 MED ORDER — MORPHINE SULFATE (PF) 4 MG/ML IV SOLN: 2.0000 mg | INTRAVENOUS | Status: DC | PRN

## 2020-09-05 MED ORDER — HEPARIN SODIUM (PORCINE) 1000 UNIT/ML IJ SOLN
INTRAMUSCULAR | Status: AC
  Filled 2020-09-05: qty 1

## 2020-09-05 MED ORDER — MIDAZOLAM HCL 5 MG/5ML IJ SOLN
INTRAMUSCULAR | Status: AC
  Filled 2020-09-05: qty 5

## 2020-09-05 MED ORDER — ONDANSETRON HCL 4 MG/2ML IJ SOLN: 4.0000 mg | Freq: Four times a day (QID) | INTRAMUSCULAR | Status: DC | PRN

## 2020-09-05 MED ORDER — SODIUM CHLORIDE 0.9 % IV SOLN: INTRAVENOUS | Status: DC

## 2020-09-05 MED ORDER — MIDAZOLAM HCL 2 MG/2ML IJ SOLN
INTRAMUSCULAR | Status: DC | PRN
  Administered 2020-09-05 (×3): 1 mg via INTRAVENOUS
  Administered 2020-09-05: 2 mg via INTRAVENOUS

## 2020-09-05 MED ORDER — METHYLPREDNISOLONE SODIUM SUCC 125 MG IJ SOLR: 125.0000 mg | Freq: Once | INTRAMUSCULAR | Status: DC | PRN

## 2020-09-05 MED ORDER — HYDRALAZINE HCL 20 MG/ML IJ SOLN: 5.0000 mg | INTRAMUSCULAR | Status: DC | PRN

## 2020-09-05 MED ORDER — CEFAZOLIN SODIUM-DEXTROSE 2-4 GM/100ML-% IV SOLN
2.0000 g | Freq: Once | INTRAVENOUS | Status: AC
  Administered 2020-09-05: 2 g via INTRAVENOUS

## 2020-09-05 MED ORDER — FENTANYL CITRATE (PF) 100 MCG/2ML IJ SOLN
INTRAMUSCULAR | Status: DC | PRN
  Administered 2020-09-05: 50 ug via INTRAVENOUS
  Administered 2020-09-05: 25 ug via INTRAVENOUS
  Administered 2020-09-05: 50 ug via INTRAVENOUS
  Administered 2020-09-05: 25 ug via INTRAVENOUS
  Administered 2020-09-05: 50 ug via INTRAVENOUS

## 2020-09-05 MED ORDER — SODIUM CHLORIDE 0.9% FLUSH: 3.0000 mL | INTRAVENOUS | Status: DC | PRN

## 2020-09-05 MED ORDER — LABETALOL HCL 5 MG/ML IV SOLN: 10.0000 mg | INTRAVENOUS | Status: DC | PRN

## 2020-09-05 MED ORDER — SODIUM CHLORIDE 0.9% FLUSH: 3.0000 mL | Freq: Two times a day (BID) | INTRAVENOUS | Status: DC

## 2020-09-05 MED ORDER — IODIXANOL 320 MG/ML IV SOLN
INTRAVENOUS | Status: DC | PRN
  Administered 2020-09-05: 70 mL

## 2020-09-05 MED ORDER — ACETAMINOPHEN 325 MG PO TABS: 650.0000 mg | ORAL_TABLET | ORAL | Status: DC | PRN

## 2020-09-05 MED ORDER — DIPHENHYDRAMINE HCL 50 MG/ML IJ SOLN: 50.0000 mg | Freq: Once | INTRAMUSCULAR | Status: DC | PRN

## 2020-09-05 MED ORDER — HYDROMORPHONE HCL 1 MG/ML IJ SOLN: 1.0000 mg | Freq: Once | INTRAMUSCULAR | Status: DC | PRN

## 2020-09-05 MED ORDER — HEPARIN SODIUM (PORCINE) 1000 UNIT/ML IJ SOLN
INTRAMUSCULAR | Status: DC | PRN
  Administered 2020-09-05: 5000 [IU] via INTRAVENOUS

## 2020-09-05 MED ORDER — CLOPIDOGREL BISULFATE 300 MG PO TABS
300.0000 mg | ORAL_TABLET | ORAL | Status: AC
  Administered 2020-09-05: 300 mg via ORAL

## 2020-09-05 MED ORDER — MIDAZOLAM HCL 2 MG/ML PO SYRP: 8.0000 mg | ORAL_SOLUTION | Freq: Once | ORAL | Status: DC | PRN

## 2020-09-05 MED ORDER — FAMOTIDINE 20 MG PO TABS: 40.0000 mg | ORAL_TABLET | Freq: Once | ORAL | Status: DC | PRN

## 2020-09-05 MED ORDER — SODIUM CHLORIDE 0.9 % IV SOLN: 250.0000 mL | INTRAVENOUS | Status: DC | PRN

## 2020-09-05 SURGICAL SUPPLY — 23 items
BALLN LUTONIX 018 4X100X130 (BALLOONS) ×2
BALLN LUTONIX 018 5X40X130 (BALLOONS) ×2
BALLN LUTONIX 018 5X60X130 (BALLOONS) ×2
BALLOON LUTONIX 018 4X100X130 (BALLOONS) ×1 IMPLANT
BALLOON LUTONIX 018 5X40X130 (BALLOONS) ×1 IMPLANT
BALLOON LUTONIX 018 5X60X130 (BALLOONS) ×1 IMPLANT
CATH ANGIO 5F PIGTAIL 65CM (CATHETERS) ×2 IMPLANT
CATH ROTAREX 135 6FR (CATHETERS) ×2 IMPLANT
CATH VERT 5FR 125CM (CATHETERS) ×2 IMPLANT
DEVICE STARCLOSE SE CLOSURE (Vascular Products) ×2 IMPLANT
GLIDEWIRE ADV .035X260CM (WIRE) ×2 IMPLANT
KIT ENCORE 26 ADVANTAGE (KITS) ×2 IMPLANT
NEEDLE ENTRY 21GA 7CM ECHOTIP (NEEDLE) ×2 IMPLANT
PACK ANGIOGRAPHY (CUSTOM PROCEDURE TRAY) ×2 IMPLANT
SET INTRO CAPELLA COAXIAL (SET/KITS/TRAYS/PACK) ×2 IMPLANT
SHEATH BRITE TIP 5FRX11 (SHEATH) ×2 IMPLANT
SHEATH RAABE 6FR (SHEATH) ×2 IMPLANT
STENT LIFESTENT 5F 6X60X135 (Permanent Stent) ×2 IMPLANT
SYR MEDRAD MARK 7 150ML (SYRINGE) ×2 IMPLANT
TUBING CONTRAST HIGH PRESS 72 (TUBING) ×2 IMPLANT
WIRE G V18X300CM (WIRE) ×2 IMPLANT
WIRE GUIDERIGHT .035X150 (WIRE) ×2 IMPLANT
WIRE RUNTHROUGH .014X300CM (WIRE) ×2 IMPLANT

## 2020-09-05 NOTE — Progress Notes (Signed)
Dr. Gilda Crease spoke to patient regarding procedure and placement of x2 stents.  Patient verbalizes understanding.  No c/o's at this time. Right femoral star closure intact, no s/s hematoma .

## 2020-09-05 NOTE — H&P (Signed)
@LOGO @   MRN : [id number]  Melanie Browning is a 79 y.o. (1941/03/08) female who presents with chief complaint of leg hurts when I walk.  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  She has a history of a left popliteal artery angioplasty which failed and subsequently required reconstructive surgery.  This was done remotely while she was living in [facility].  Initial evaluation several months ago showed stable vascular reconstruction.  Unfortunately while on vacation she noted recurrence of her symptoms.  Once she returned to [facility] noninvasive studies were obtained which demonstrated greater than 70% stricture in the distal aspect of her previous reconstruction on the left.  Patient denies rest pain.  No new ulcers have occurred.  Current Meds  Medication Sig  . ascorbic acid (VITAMIN C) 1000 MG tablet Take 3,000 mg by mouth daily.   Marland Kitchen aspirin EC 81 MG tablet Take 81 mg by mouth every evening. Swallow whole.  Marland Kitchen atorvastatin (LIPITOR) 40 MG tablet Take 40 mg by mouth every evening.   . B Complex-C (B-COMPLEX WITH VITAMIN C) tablet Take 1 tablet by mouth daily.  . Budesonide ER 9 MG CP24 Take 9 mg by mouth every 3 (three) days.   . Cholecalciferol 125 MCG (5000 UT) capsule Take 5,000 Units by mouth daily.   . diphenoxylate-atropine (LOMOTIL) 2.5-0.025 MG tablet Take 1 tablet by mouth 4 (four) times daily as needed for diarrhea or loose stools.  Marland Kitchen levothyroxine (SYNTHROID) 75 MCG tablet Take 75 mcg by mouth daily before breakfast.   . melatonin 5 MG TABS Take 5 mg by mouth at bedtime as needed (sleep).  . Multiple Vitamins-Minerals (ZINC PO) Take 1 tablet by mouth daily.  . niacin (SLO-NIACIN) 500 MG tablet Take 500 mg by mouth at bedtime.    Past Medical History:  Diagnosis Date  . Arthritis   . Dry eye    bilateral when living in [facility]  . HPV (human papilloma virus)  infection   . Hyperlipidemia   . Ischemia of left lower extremity   . Osteoporosis   . Polymyalgia rheumatica (HCC)   . Thyroid disease   . Vaginal dysplasia     Past Surgical History:  Procedure Laterality Date  . ABDOMINAL HYSTERECTOMY     complete  . colposcobpy    . knee athroscopy Left   . LAPAROSCOPIC HYSTERECTOMY      Social History Social History   Tobacco Use  . Smoking status: Never Smoker  . Smokeless tobacco: Never Used  Vaping Use  . Vaping Use: Never used  Substance Use Topics  . Alcohol use: Yes    Alcohol/week: 7.0 standard drinks    Types: 7 Glasses of wine per week    Comment: 1 glass of wine every day  . Drug use: Never    Family History Family History  Problem Relation Age of Onset  . Cancer Mother   . Heart disease Father     Allergies  Allergen Reactions  . Lactose Other (See Comments)    Gi upset and diarrhea      REVIEW OF SYSTEMS (Negative unless checked)  Constitutional: [] Weight loss  [] Fever  [] Chills Cardiac: [] Chest pain   [] Chest pressure   [] Palpitations   [] Shortness of breath when laying flat   [] Shortness of breath with exertion. Vascular:  [x] Pain in legs with walking   [] Pain in legs at rest  []   History of DVT   [] Phlebitis   [] Swelling in legs   [x] Varicose veins   [] Non-healing ulcers Pulmonary:   [] Uses home oxygen   [] Productive cough   [] Hemoptysis   [] Wheeze  [] COPD   [] Asthma Neurologic:  [] Dizziness   [] Seizures   [] History of stroke   [] History of TIA  [] Aphasia   [] Vissual changes   [] Weakness or numbness in arm   [] Weakness or numbness in leg Musculoskeletal:   [] Joint swelling   [x] Joint pain   [] Low back pain Hematologic:  [] Easy bruising  [] Easy bleeding   [] Hypercoagulable state   [] Anemic Gastrointestinal:  [] Diarrhea   [] Vomiting  [] Gastroesophageal reflux/heartburn   [] Difficulty swallowing. Genitourinary:  [] Chronic kidney disease   [] Difficult urination  [] Frequent urination   [] Blood in urine Skin:   [x] Rashes   [] Ulcers  Psychological:  [] History of anxiety   []  History of major depression.  Physical Examination  Vitals:   09/05/20 0735  BP: (!) 158/82  Pulse: 63  Resp: 20  Temp: 97.8 F (36.6 C)  TempSrc: Oral  SpO2: 99%  Weight: 67.1 kg  Height: 5\' 8"  (1.727 m)   Body mass index is 22.5 kg/m. Gen: WD/WN, NAD Head: [place]/AT, No temporalis wasting.  Ear/Nose/Throat: Hearing grossly intact, nares w/o erythema or drainage Eyes: PER, EOMI, sclera nonicteric.  Neck: Supple, no large masses.   Pulmonary:  Good air movement, no audible wheezing bilaterally, no use of accessory muscles.  Cardiac: RRR, no JVD Vascular:scattered varicosities present bilaterally.  Mild venous stasis changes to the legs bilaterally.  trace soft pitting edema Vessel Right Left  Radial Palpable Palpable  PT Palpable Not Palpable  DP Palpable Not Palpable  Gastrointestinal: Non-distended. No guarding/no peritoneal signs.  Musculoskeletal: M/S 5/5 throughout.  No deformity or atrophy.  Neurologic: CN 2-12 intact. Symmetrical.  Speech is fluent. Motor exam as listed above. Psychiatric: Judgment intact, Mood & affect appropriate for pt's clinical situation. Dermatologic: No rashes or ulcers noted.  No changes consistent with cellulitis. Lymph : No lichenification or skin changes of chronic lymphedema.  CBC No results found for: WBC, HGB, HCT, MCV, PLT  BMET No results found for: NA, K, CL, CO2, GLUCOSE, BUN, CREATININE, CALCIUM, GFRNONAA, GFRAA CrCl cannot be calculated (No successful lab value found.).  COAG No results found for: INR, PROTIME  Radiology VAS [place] LOWER EXTREMITY ARTERIAL DUPLEX  Result Date: 08/31/2020 LOWER EXTREMITY ARTERIAL DUPLEX STUDY Indications: S/P Surgery.  Vascular Interventions: 10/13/2019: Rt Lower Extremity Arteriogram via Rt Common                         Femoral Artery Left Popliteal Balloon Angiplasty.                          10/14/2019: Lt Popliteal Artery  Reconstruction.                          02/03/2020: Hybrid Left Lower Extremity Arteriogram via                         Right Common Femoral Artery Left Popliteal Balloon                         Angioplasty. Current ABI:            not done Limitations: New claudication symptoms in left calf with  exertion Performing Technologist: Salvadore Farber RVT  Examination Guidelines: A complete evaluation includes B-mode imaging, spectral Doppler, color Doppler, and power Doppler as needed of all accessible portions of each vessel. Bilateral testing is considered an integral part of a complete examination. Limited examinations for reoccurring indications may be performed as noted.  +-----------+--------+-----+---------------+----------+--------------+ LEFT       PSV cm/sRatioStenosis       Waveform  Comments       +-----------+--------+-----+---------------+----------+--------------+ CFA Mid    107                         triphasic                +-----------+--------+-----+---------------+----------+--------------+ DFA        61                          biphasic                 +-----------+--------+-----+---------------+----------+--------------+ SFA Prox   93                          biphasic                 +-----------+--------+-----+---------------+----------+--------------+ SFA Mid    89                          monophasic               +-----------+--------+-----+---------------+----------+--------------+ SFA Distal 91                          biphasic                 +-----------+--------+-----+---------------+----------+--------------+ POP Prox   91                          biphasic                 +-----------+--------+-----+---------------+----------+--------------+ POP Distal 55                          biphasic                 +-----------+--------+-----+---------------+----------+--------------+ TP Trunk   273          75-99% stenosisbiphasic                  +-----------+--------+-----+---------------+----------+--------------+ ATA Distal 40                          monophasic               +-----------+--------+-----+---------------+----------+--------------+ PTA Prox   130                         biphasic  turbulent flow +-----------+--------+-----+---------------+----------+--------------+ PTA Distal 52                          monophasic               +-----------+--------+-----+---------------+----------+--------------+ PERO Distal35                          monophasic               +-----------+--------+-----+---------------+----------+--------------+  Summary: Left: New severe stenosis seen just distal to the bovine patch/popliteal reconstruction site. This area looks significantly narrowed. Calf vessels are widely patent but show 70% decrease in velocities compared to previous study of 04/2020.  See table(s) above for measurements and observations. Electronically signed by Levora Dredge MD on 08/31/2020 at 4:15:31 PM.    Final      Assessment/Plan 1. 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  Noninvasive studies demonstrate a high-grade recurrence hemodynamically significant in the distal aspect of her previous left popliteal reconstruction   Given the severity of the patient's lower extremity symptoms the patient should undergo angiography and intervention.  Risk and benefits were reviewed the patient.  Indications for the procedure were reviewed.  All questions were answered, the patient agrees to proceed.   The patient should continue walking and begin a more formal exercise program.  The patient should continue antiplatelet therapy and aggressive treatment of the lipid abnormalities  The patient will follow up with me after the left leg  angiogram.   2. Chronic venous insufficiency No surgery or intervention at this point in time.    I have had a long discussion with the patient regarding venous insufficiency and why it  causes symptoms. I have discussed with the patient the chronic skin changes that accompany venous insufficiency and the long term sequela such as infection and ulceration.  Patient will begin wearing graduated compression stockings class 1 (20-30 mmHg) or compression wraps on a daily basis a prescription was given. The patient will put the stockings on first thing in the morning and removing them in the evening. The patient is instructed specifically not to sleep in the stockings.    In addition, behavioral modification including several periods of elevation of the lower extremities during the day will be continued. I have demonstrated that proper elevation is a position with the ankles at heart level.  The patient is instructed to begin routine exercise, especially walking on a daily basis  3. Mixed hyperlipidemia Continue statin as ordered and reviewed, no changes at this time   4. Hypothyroidism, unspecified type Continue hormone replacement medications as already ordered, these medications have been reviewed and there are no changes at this time.  Levora Dredge, MD  09/05/2020 7:58 AM

## 2020-09-05 NOTE — Progress Notes (Signed)
Right femoral area with dime size drainage, dressing reinforced, no further drainage noted.  Patient to start plavix, "I'm not happy about that"  Reviewed post op procedure and discharge instructions.  Did not want anything to eat while in recovery , "just want to sleep" Tolerated bottle of water x2

## 2020-09-05 NOTE — Interval H&P Note (Signed)
History and Physical Interval Note:  09/05/2020 8:03 AM  Melanie Browning  has presented today for surgery, with the diagnosis of LT Leg Extremity Angiography with intervention   ASO with claudication    BARD Rep  cc: M Godley, S Willey   Pt to have Covid test on 12-3.  The various methods of treatment have been discussed with the patient and family. After consideration of risks, benefits and other options for treatment, the patient has consented to  Procedure(s): LOWER EXTREMITY ANGIOGRAPHY (Left) as a surgical intervention.  The patient's history has been reviewed, patient examined, no change in status, stable for surgery.  I have reviewed the patient's chart and labs.  Questions were answered to the patient's satisfaction.     Melanie Browning

## 2020-09-05 NOTE — Op Note (Signed)
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 Introduction catheter into left lower extremity 3rd order catheter placement  2. Contrast injection left lower extremity for distal runoff  3. Percutaneous transluminal angioplasty and stent placement to 5 mm left popliteal artery with a 6 mm life stent  4. Percutaneous transluminal angioplasty left tibioperoneal trunk to 4 mm with Lutonix drug-eluting balloon  5. Mechanical thrombectomy using the [place] Rex system left popliteal artery and left tibioperoneal trunk.              6.  Star close closure right common femoral arteriotomy  Anesthesia: Conscious sedation was administered under my direct supervision by the interventional radiology RN. IV Versed plus fentanyl were utilized. Continuous ECG, pulse oximetry and blood pressure was monitored throughout the entire procedure.  Conscious sedation was for a total of 1 hour 5 minutes.  Sheath: 6 [place] Raby right common femoral retrograde  Contrast: 70 cc  Fluoroscopy Time: 9.1 minutes  Indications: Melanie Browning presents with increasing pain of the left lower extremity.  Noninvasive studies obtained in my office clearly show a hemodynamically significant lesion associated with the previous popliteal artery repair.  This suggests the patient is having limb threatening ischemia. The risks and benefits for angiography with intervention for revascularization and limb salvage  are reviewed, all questions answered alternative therapies were also discussed.  The patient agrees to proceed.  Procedure:Melanie Browning is a 79 y.o. y.o. female who was identified and appropriate procedural time out was performed. The patient was then placed supine on the table and prepped and draped in the usual sterile fashion.   Ultrasound was placed in the sterile sleeve and the right groin was evaluated the right common femoral artery was echolucent and pulsatile indicating patency. Image was recorded for the permanent record and under real-time visualization a microneedle was inserted into the common femoral artery followed by the microwire and then the micro-sheath. A J-wire was then advanced through the micro-sheath and a 5 [place] sheath was then inserted over a J-wire. J-wire was then advanced and a 5 French pigtail catheter was positioned at the level of T12.  AP projection of the aorta was then obtained. Pigtail catheter was repositioned to above the bifurcation and a RAO view of the pelvis was obtained. Subsequently a pigtail catheter with the advantage Glidewire was used to cross the aortic bifurcation the catheter wire were advanced down into the left distal external iliac artery. Oblique view of the femoral bifurcation was then obtained and subsequently the wire was reintroduced and the pigtail catheter negotiated into the SFA representing third order catheter placement. Distal runoff was then performed.  Diagnostic interpretation: The abdominal aorta is opacified with a bolus injection contrast.  It is widely patent.  There are mild atherosclerotic changes noted but there are no hemodynamically significant lesions noted.  Bilateral single renal arteries are noted they are widely patent normal nephrograms are noted.  The distal aorta and the iliac bifurcation is widely patent.  The bilateral common internal and external iliac arteries are all widely patent and free of any  hemodynamically significant stenosis.  The left common femoral profunda femoris and superficial femoral arteries are widely patent.  There is mild atherosclerotic changes noted but this is quite minimal.  The popliteal artery is imaged and demonstrates a greater than 95% stenosis in its midportion at what appears to be the proximal margin of the previous repair.  Distally the trifurcation is notable for a greater than 95% stenosis in the tibioperoneal trunk.  The origin of the anterior tibial is noted to be widely patent.  The anterior tibial and posterior tibial arteries are widely patent throughout their course with less than 10% focal stenosis throughout.  The peroneal is patent but tapers quite rapidly to a very small artery and does not appear to contribute significantly to the foot or crossed the ankle in a meaningful fashion.  The posterior tibial and anterior tibial fill the pedal arch.  5000 units of heparin was then given and allowed to circulate and a 6 [place] Raby sheath was advanced up and over the bifurcation and positioned in the superficial femoral artery  Vertebral catheter and advantage Glidewire were then negotiated down into the distal popliteal. Hand injection contrast demonstrated the lesion as well as the proximal tibial anatomy in detail.  The [place] Rex thrombectomy device was then prepped on the field a V 18 wire introduced through the vertebral catheter and negotiated down into the mid peroneal.  The [place] Rex catheter was then used to make multiple passes through the popliteal lesion once this had been adequately treated the catheter system was advanced down into the tibioperoneal trunk and a total of 3 passes was made across this lesion.  I then reimaged the popliteal and trifurcation and noted approximately 60% residual stenosis within the popliteal and 50% residual stenosis in the tibioperoneal trunk.  A 4 mm x 100 mm Lutonix drug-eluting balloon was used to treat the tibioperoneal  trunk lesion in its entirety as well as pretreat the popliteal lesion.  Inflation was to 8 atm for 1 2 full minutes.  Next a 5 mm x 40 mm Lutonix drug-eluting balloon was advanced across the popliteal lesion inflation was to 8 atm for 2 minutes.  Follow-up imaging now demonstrated resolution of the tibioperoneal trunk lesion with less than 5% residual stenosis and preservation of the ostia of all 3 tibial vessels.  However there appeared to be greater than 50% residual stenosis noted in the popliteal lesion this was best seen on a steep LAO projection.  Because of this residual stenosis, I elected to place a life stent.  A 6 mm x 60 mm life stent was then deployed across the popliteal lesion and then postdilated using a 5 mm x 60 mm Lutonix drug-eluting balloon inflated to 6 atm for 1 minute.  Follow-up imaging now demonstrated less than 5% residual stenosis throughout the entirety of the popliteal as well as the trifurcation.  There is preservation of the distal runoff.    After review of these images the sheath is pulled into the right external iliac oblique of the common femoral is obtained and a Star close device deployed. There no immediate Complications.  Findings:  The abdominal aorta is opacified with a bolus injection contrast.  It is widely patent.  There are mild atherosclerotic changes noted but there are no hemodynamically significant lesions noted.  Bilateral single renal arteries are noted they are widely patent normal nephrograms are noted.  The distal aorta and the iliac bifurcation is widely patent.  The bilateral common internal and external iliac arteries are all widely patent and free of any hemodynamically significant stenosis.  The left  common femoral profunda femoris and superficial femoral arteries are widely patent.  There is mild atherosclerotic changes noted but this is quite minimal.  The popliteal artery is imaged and demonstrates a greater than 95% stenosis in its midportion at  what appears to be the proximal margin of the previous repair.  Distally the trifurcation is notable for a greater than 95% stenosis in the tibioperoneal trunk.  The origin of the anterior tibial is noted to be widely patent.  The anterior tibial and posterior tibial arteries are widely patent throughout their course with less than 10% focal stenosis throughout.  The peroneal is patent but tapers quite rapidly to a very small artery and does not appear to contribute significantly to the foot or crossed the ankle in a meaningful fashion.  The posterior tibial and anterior tibial fill the pedal arch.  Following [place] Rex thrombectomy there is a significant reduction in the organized thrombus noted on imaging however the result achieved with thrombectomy alone is not adequate.  Following angioplasty to 4 mm the lesion in the tibioperoneal trunk demonstrates less than 5% residual stenosis and is well treated.  Following angioplasty to 5 mm in the popliteal there is greater than 50% stenosis and therefore a life stent is deployed across this lesion postdilated to 5 mm resulting in less than 5% residual stenosis.  Summary: Successful recanalization left lower extremity for limb salvage   Disposition: Patient was taken to the recovery room in stable condition having tolerated the procedure well.  Melanie Browning 09/05/2020,12:02 PM

## 2020-09-07 ENCOUNTER — Ambulatory Visit (INDEPENDENT_AMBULATORY_CARE_PROVIDER_SITE_OTHER): Payer: Medicare Other | Admitting: Vascular Surgery

## 2020-09-20 ENCOUNTER — Other Ambulatory Visit (INDEPENDENT_AMBULATORY_CARE_PROVIDER_SITE_OTHER): Payer: Self-pay | Admitting: Vascular Surgery

## 2020-09-20 DIAGNOSIS — I70212 Atherosclerosis of native arteries of extremities with intermittent claudication, left leg: Secondary | ICD-10-CM

## 2020-09-20 DIAGNOSIS — Z95820 Peripheral vascular angioplasty status with implants and grafts: Secondary | ICD-10-CM

## 2020-09-25 ENCOUNTER — Other Ambulatory Visit: Payer: Self-pay

## 2020-09-25 ENCOUNTER — Ambulatory Visit (INDEPENDENT_AMBULATORY_CARE_PROVIDER_SITE_OTHER): Payer: Medicare Other

## 2020-09-25 ENCOUNTER — Encounter (INDEPENDENT_AMBULATORY_CARE_PROVIDER_SITE_OTHER): Payer: Self-pay | Admitting: Nurse Practitioner

## 2020-09-25 ENCOUNTER — Ambulatory Visit (INDEPENDENT_AMBULATORY_CARE_PROVIDER_SITE_OTHER): Payer: Medicare Other | Admitting: Nurse Practitioner

## 2020-09-25 VITALS — BP 148/78 | HR 61 | Ht 68.0 in | Wt 147.0 lb

## 2020-09-25 DIAGNOSIS — I70212 Atherosclerosis of native arteries of extremities with intermittent claudication, left leg: Secondary | ICD-10-CM

## 2020-09-25 DIAGNOSIS — Z95820 Peripheral vascular angioplasty status with implants and grafts: Secondary | ICD-10-CM | POA: Diagnosis not present

## 2020-09-28 ENCOUNTER — Encounter (INDEPENDENT_AMBULATORY_CARE_PROVIDER_SITE_OTHER): Payer: Self-pay

## 2020-09-28 DIAGNOSIS — K591 Functional diarrhea: Secondary | ICD-10-CM | POA: Insufficient documentation

## 2020-09-28 DIAGNOSIS — Z7901 Long term (current) use of anticoagulants: Secondary | ICD-10-CM | POA: Insufficient documentation

## 2020-09-28 DIAGNOSIS — R1032 Left lower quadrant pain: Secondary | ICD-10-CM | POA: Insufficient documentation

## 2020-10-03 ENCOUNTER — Other Ambulatory Visit
Admission: RE | Admit: 2020-10-03 | Discharge: 2020-10-03 | Disposition: A | Discharge status: Home / Self Care | Payer: Medicare Other | Source: Ambulatory Visit | Attending: Surgery | Admitting: Surgery

## 2020-10-03 ENCOUNTER — Other Ambulatory Visit: Payer: Self-pay

## 2020-10-03 ENCOUNTER — Encounter: Payer: Self-pay | Admitting: Internal Medicine

## 2020-10-03 DIAGNOSIS — Z01812 Encounter for preprocedural laboratory examination: Secondary | ICD-10-CM | POA: Insufficient documentation

## 2020-10-03 DIAGNOSIS — Z20822 Contact with and (suspected) exposure to covid-19: Secondary | ICD-10-CM | POA: Diagnosis not present

## 2020-10-03 LAB — SARS CORONAVIRUS 2 (TAT 6-24 HRS): SARS Coronavirus 2: NEGATIVE

## 2020-10-04 ENCOUNTER — Encounter: Payer: Self-pay | Admitting: Internal Medicine

## 2020-10-04 ENCOUNTER — Encounter
Admission: RE | Disposition: A | Discharge status: Home / Self Care | Payer: Self-pay | Source: Home / Self Care | Attending: Internal Medicine

## 2020-10-04 ENCOUNTER — Ambulatory Visit: Payer: Medicare Other | Admitting: Anesthesiology

## 2020-10-04 ENCOUNTER — Ambulatory Visit
Admission: RE | Admit: 2020-10-04 | Discharge: 2020-10-04 | Disposition: A | Discharge status: Home / Self Care | Payer: Medicare Other | Attending: Internal Medicine | Admitting: Internal Medicine

## 2020-10-04 ENCOUNTER — Other Ambulatory Visit: Payer: Self-pay

## 2020-10-04 DIAGNOSIS — K644 Residual hemorrhoidal skin tags: Secondary | ICD-10-CM | POA: Diagnosis not present

## 2020-10-04 DIAGNOSIS — Z7982 Long term (current) use of aspirin: Secondary | ICD-10-CM | POA: Insufficient documentation

## 2020-10-04 DIAGNOSIS — K52831 Collagenous colitis: Secondary | ICD-10-CM | POA: Insufficient documentation

## 2020-10-04 DIAGNOSIS — Z79899 Other long term (current) drug therapy: Secondary | ICD-10-CM | POA: Diagnosis not present

## 2020-10-04 DIAGNOSIS — K529 Noninfective gastroenteritis and colitis, unspecified: Secondary | ICD-10-CM | POA: Diagnosis present

## 2020-10-04 DIAGNOSIS — M353 Polymyalgia rheumatica: Secondary | ICD-10-CM | POA: Diagnosis not present

## 2020-10-04 DIAGNOSIS — K640 First degree hemorrhoids: Secondary | ICD-10-CM | POA: Diagnosis not present

## 2020-10-04 DIAGNOSIS — E739 Lactose intolerance, unspecified: Secondary | ICD-10-CM | POA: Diagnosis not present

## 2020-10-04 HISTORY — PX: COLONOSCOPY WITH PROPOFOL: SHX5780

## 2020-10-04 SURGERY — COLONOSCOPY WITH PROPOFOL
Anesthesia: General

## 2020-10-04 MED ORDER — PROPOFOL 500 MG/50ML IV EMUL
INTRAVENOUS | Status: DC | PRN
  Administered 2020-10-04: 150 ug/kg/min via INTRAVENOUS

## 2020-10-04 MED ORDER — SODIUM CHLORIDE 0.9 % IV SOLN: INTRAVENOUS | Status: DC

## 2020-10-04 MED ORDER — PROPOFOL 10 MG/ML IV BOLUS
INTRAVENOUS | Status: DC | PRN
  Administered 2020-10-04: 50 mg via INTRAVENOUS

## 2020-10-04 NOTE — Interval H&P Note (Signed)
History and Physical Interval Note:  10/04/2020 12:02 PM  Melanie Browning  has presented today for surgery, with the diagnosis of DIARRHEA LLQ PAIN.  The various methods of treatment have been discussed with the patient and family. After consideration of risks, benefits and other options for treatment, the patient has consented to  Procedure(s): COLONOSCOPY WITH PROPOFOL (N/A) as a surgical intervention.  The patient's history has been reviewed, patient examined, no change in status, stable for surgery.  I have reviewed the patient's chart and labs.  Questions were answered to the patient's satisfaction.     [place], [place]

## 2020-10-04 NOTE — Transfer of Care (Signed)
Immediate Anesthesia Transfer of Care Note  Patient: Melanie Browning  Procedure(s) Performed: COLONOSCOPY WITH PROPOFOL (N/A )  Patient Location: PACU  Anesthesia Type:General  Level of Consciousness: awake, alert  and oriented  Airway & Oxygen Therapy: Patient Spontanous Breathing and Patient connected to nasal cannula oxygen  Post-op Assessment: Report given to RN and Post -op Vital signs reviewed and stable  Post vital signs: Reviewed and stable  Last Vitals:  Vitals Value Taken Time  BP 104/82 10/04/20 1222  Temp    Pulse 79 10/04/20 1223  Resp 17 10/04/20 1223  SpO2 99 % 10/04/20 1223  Vitals shown include unvalidated device data.  Last Pain:  Vitals:   10/04/20 1222  TempSrc:   PainSc: 0-No pain         Complications: No complications documented.

## 2020-10-04 NOTE — Anesthesia Preprocedure Evaluation (Signed)
Anesthesia Evaluation  Patient identified by MRN, date of birth, ID band Patient awake    Reviewed: Allergy & Precautions, H&P , NPO status , Patient's Chart, lab work & pertinent test results  History of Anesthesia Complications Negative for: history of anesthetic complications  Airway Mallampati: III  TM Distance: >3 FB Neck ROM: limited    Dental  (+) Chipped   Pulmonary neg pulmonary ROS, neg shortness of breath,    Pulmonary exam normal        Cardiovascular Exercise Tolerance: Good (-) angina+ Peripheral Vascular Disease  (-) Past MI negative cardio ROS Normal cardiovascular exam     Neuro/Psych negative neurological ROS  negative psych ROS   GI/Hepatic negative GI ROS, Neg liver ROS, neg GERD  ,  Endo/Other  Hypothyroidism   Renal/GU negative Renal ROS  negative genitourinary   Musculoskeletal  (+) Arthritis ,   Abdominal   Peds  Hematology negative hematology ROS (+)   Anesthesia Other Findings Past Medical History: No date: Arthritis No date: Dry eye     Comment[place]  [place] 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 No date: Hyperlipidemia No date: Ischemia of left lower extremity No date: Osteoporosis No date: Polymyalgia rheumatica (HCC) No date: Thyroid disease No date: Vaginal dysplasia  Past Surgical History: No date: ABDOMINAL HYSTERECTOMY     Comment:  complete No date: colposcobpy No date: COLPOSCOPY No date: knee athroscopy; Left No date: LAPAROSCOPIC HYSTERECTOMY 09/05/2020: LOWER EXTREMITY ANGIOGRAPHY; Left     Comment:  Procedure: LOWER EXTREMITY ANGIOGRAPHY;  Surgeon:               Renford Dills, MD;  Location: [facility] INVASIVE CV LAB;               Service: Cardiovascular;  Laterality: Left;  BMI    Body Mass Index: 22.50 kg/m      Reproductive/Obstetrics negative OB ROS                             Anesthesia  Physical Anesthesia Plan  ASA: II  Anesthesia Plan: General   Post-op Pain Management:    Induction: Intravenous  PONV Risk Score and Plan: Propofol infusion and TIVA  Airway Management Planned: Natural Airway and Nasal Cannula  Additional Equipment:   Intra-op Plan:   Post-operative Plan:   Informed Consent: I have reviewed the patients History and Physical, chart, labs and discussed the procedure including the risks, benefits and alternatives for the proposed anesthesia with the patient or authorized representative who has indicated his/her understanding and acceptance.     Dental Advisory Given  Plan Discussed with: Anesthesiologist, CRNA and Surgeon  Anesthesia Plan Comments: (Patient consented for risks of anesthesia including but not limited to:  - adverse reactions to medications - risk of airway placement if required - damage to eyes, teeth, lips or other oral mucosa - nerve damage due to positioning  - sore throat or hoarseness - Damage to heart, brain, nerves, lungs, other parts of body or loss of life  Patient voiced understanding.)        Anesthesia Quick Evaluation

## 2020-10-04 NOTE — Anesthesia Procedure Notes (Signed)
Date/Time: 10/04/2020 12:07 PM Performed by: Junious Silk, CRNA Pre-anesthesia Checklist: Patient identified, Emergency Drugs available, Suction available, Patient being monitored and Timeout performed Oxygen Delivery Method: Nasal cannula

## 2020-10-04 NOTE — H&P (Signed)
Outpatient short stay form Pre-procedure 10/04/2020 12:01 PM Melanie Browning Melanie Browning, M.D.  Primary Physician: Melanie Browning, M.D.  Reason for visit: Chronic diarrhea, LLQ pain, Lymphocytic colitis.  History of present illness:  AS above. Otherwise, Patient denies change in bowel habits, rectal bleeding, weight loss.      Current Facility-Administered Medications:  .  0.9 %  sodium chloride infusion, , Intravenous, Continuous, Melanie Browning, Melanie Nearing, MD, Last Rate: 20 mL/hr at 10/04/20 1038, New Bag at 10/04/20 1038  Medications Prior to Admission  Medication Sig Dispense Refill Last Dose  . ascorbic acid (VITAMIN C) 1000 MG tablet Take 3,000 mg by mouth daily.    Past Week at Unknown time  . aspirin EC 81 MG tablet Take 81 mg by mouth every evening. Swallow whole.   Past Week at Unknown time  . atorvastatin (LIPITOR) 40 MG tablet Take 40 mg by mouth every evening.    Past Week at Unknown time  . B Complex-C (B-COMPLEX WITH VITAMIN C) tablet Take 1 tablet by mouth daily.   Past Week at Unknown time  . Budesonide ER 9 MG CP24 Take 9 mg by mouth every 3 (three) days.    Past Week at Unknown time  . diphenoxylate-atropine (LOMOTIL) 2.5-0.025 MG tablet Take 1 tablet by mouth 4 (four) times daily as needed for diarrhea or loose stools.   Past Month at Unknown time  . gabapentin (NEURONTIN) 100 MG capsule Take 100 mg by mouth daily as needed.   Past Month at Unknown time  . levothyroxine (SYNTHROID) 75 MCG tablet Take 75 mcg by mouth daily before breakfast.    10/04/2020 at Unknown time  . Lifitegrast (XIIDRA) 5 % SOLN Apply 1 drop to eye in the morning and at bedtime.     . melatonin 5 MG TABS Take 5 mg by mouth at bedtime as needed (sleep).   Past Week at Unknown time  . Multiple Vitamins-Minerals (ZINC PO) Take 1 tablet by mouth daily.   Past Week at Unknown time  . Naproxen Sodium 220 MG CAPS Take 220-440 mg by mouth daily as needed (pain.).   Past Week at Unknown time  . ZINC SULFATE PO Take by  mouth daily as needed.   Past Week at Unknown time  . Cholecalciferol 125 MCG (5000 UT) capsule Take 5,000 Units by mouth daily.      . clopidogrel (PLAVIX) 75 MG tablet Take 1 tablet (75 mg total) by mouth daily. 30 tablet 5 10/02/2020  . denosumab (PROLIA) 60 MG/ML SOSY injection Inject 60 mg into the skin every 6 (six) months. (Patient not taking: Reported on 10/04/2020)   Not Taking at Unknown time  . levofloxacin (LEVAQUIN) 500 MG tablet Take 1 tablet (500 mg total) by mouth daily. Treat for 3 days may repeat times one for three more days.  If diarrhea persists contact primary provider (Patient taking differently: Take 500 mg by mouth daily. Treat for 3 days may repeat times one for three more days.  If diarrhea persists contact primary provider) 14 tablet 0   . niacin (SLO-NIACIN) 500 MG tablet Take 500 mg by mouth at bedtime.     Melanie Browning Glyc-Propyl Glyc PF (SYSTANE ULTRA PF) 0.4-0.3 % SOLN Place 1-2 drops into both eyes daily as needed (dry/irritated eyes.).        Allergies  Allergen Reactions  . Lactose Other (See Comments)    Gi upset and diarrhea      Past Medical History:  Diagnosis Date  .  Arthritis   . Dry eye    bilateral when living in [facility]  . HPV (human papilloma virus) infection   . Hyperlipidemia   . Ischemia of left lower extremity   . Osteoporosis   . Polymyalgia rheumatica (HCC)   . Thyroid disease   . Vaginal dysplasia     Review of systems:  Otherwise negative.    Physical Exam  Gen: Alert, oriented. Appears stated age.  HEENT: [place]/AT. PERRLA. Lungs: CTA, no wheezes. CV: RR nl S1, S2. Abd: soft, benign, no masses. BS+ Ext: No edema. Pulses 2+    Planned procedures: Proceed with colonoscopy with biopsy. The patient understands the nature of the planned procedure, indications, risks, alternatives and potential complications including but not limited to bleeding, infection, perforation, damage to internal organs and possible oversedation/side  effects from anesthesia. The patient agrees and gives consent to proceed.  Please refer to procedure notes for findings, recommendations and patient disposition/instructions.     Melanie Browning Melanie Browning, M.D. Gastroenterology 10/04/2020  12:01 PM

## 2020-10-04 NOTE — Op Note (Signed)
[facility] Gastroenterology Patient Name: Melanie Browning Procedure Date: 10/04/2020 11:43 AM MRN: [id number] Account #: [id number] Date of Birth: 04-Jan-1941 Admit Type: Outpatient Age: 80 Room: [facility] ENDO ROOM 2 Gender: Female Note Status: Finalized Procedure:             Colonoscopy Indications:           Diarrhea (presumed secondary to inflammatory bowel                         disease), Lymphocytic colitis. Providers:             Boykin Nearing. Norma Fredrickson MD, MD Referring MD:          Clydie Braun (Referring MD) Medicines:             Propofol per Anesthesia Complications:         No immediate complications. Procedure:             Pre-Anesthesia Assessment:                        - The risks and benefits of the procedure and the                         sedation options and risks were discussed with the                         patient. All questions were answered and informed                         consent was obtained.                        - Patient identification and proposed procedure were                         verified prior to the procedure by the nurse. The                         procedure was verified in the procedure room.                        - ASA Grade Assessment: III - A patient with severe                         systemic disease.                        - After reviewing the risks and benefits, the patient                         was deemed in satisfactory condition to undergo the                         procedure.                        After obtaining informed consent, the colonoscope was                         passed under direct vision. Throughout the procedure,  the patient's blood pressure, pulse, and oxygen                         saturations were monitored continuously. The                         Colonoscope was introduced through the anus and                         advanced to the the terminal ileum, with                          identification of the appendiceal orifice and IC                         valve. The colonoscopy was performed without                         difficulty. The patient tolerated the procedure well.                         The quality of the bowel preparation was good. The                         terminal ileum, ileocecal valve, appendiceal orifice,                         and rectum were photographed. Findings:      The perianal exam findings include internal hemorrhoids (Grade I).      The digital rectal exam was normal. Pertinent negatives include normal       sphincter tone.      Non-bleeding internal hemorrhoids were found during retroflexion. The       hemorrhoids were Grade I (internal hemorrhoids that do not prolapse).      Normal mucosa was found in the entire colon. Biopsies for histology were       taken with a cold forceps from the random colon for evaluation of       microscopic colitis.      The terminal ileum appeared normal. Impression:            - Internal hemorrhoids (Grade I) found on perianal                         exam.                        - Non-bleeding internal hemorrhoids.                        - Normal mucosa in the entire examined colon. Biopsied. Recommendation:        - Patient has a contact number available for                         emergencies. The signs and symptoms of potential                         delayed complications were discussed with the patient.  Return to normal activities tomorrow. Written                         discharge instructions were provided to the patient.                        - Resume previous diet.                        - Continue present medications.                        - Await pathology results.                        - You do NOT require further colon cancer screening                         measures (Annual stool testing (i.e. hemoccult, FIT,                         cologuard),  sigmoidoscopy, colonoscopy or CT                         colonography). You should share this recommendation                         with your Primary Care provider.                        - Return to my office in 3 months.                        - The findings and recommendations were discussed with                         the patient. Procedure Code(s):     --- Professional ---                        [id number], Colonoscopy, flexible; with biopsy, single or                         multiple Diagnosis Code(s):     --- Professional ---                        K64.0, First degree hemorrhoids                        R19.7, Diarrhea, unspecified CPT copyright 2019 American Medical [employer]. All rights reserved. The codes documented in this report are preliminary and upon coder review may  be revised to meet current compliance requirements. Stanton Kidney MD, MD 10/04/2020 12:24:41 PM This report has been signed electronically. Number of Addenda: 0 Note Initiated On: 10/04/2020 11:43 AM Scope Withdrawal Time: 0 hours 6 minutes 11 seconds  Total Procedure Duration: 0 hours 10 minutes 55 seconds  Estimated Blood Loss:  Estimated blood loss: none. Estimated blood loss: none.      [facility]

## 2020-10-05 LAB — SURGICAL PATHOLOGY

## 2020-10-05 NOTE — Anesthesia Postprocedure Evaluation (Signed)
Anesthesia Post Note  Patient: Melanie Browning  Procedure(s) Performed: COLONOSCOPY WITH PROPOFOL (N/A )  Patient location during evaluation: Endoscopy Anesthesia Type: General Level of consciousness: awake and alert Pain management: pain level controlled Vital Signs Assessment: post-procedure vital signs reviewed and stable Respiratory status: spontaneous breathing, nonlabored ventilation, respiratory function stable and patient connected to nasal cannula oxygen Cardiovascular status: blood pressure returned to baseline and stable Postop Assessment: no apparent nausea or vomiting Anesthetic complications: no   No complications documented.   Last Vitals:  Vitals:   10/04/20 1232 10/04/20 1242  BP: 122/78 138/61  Pulse: 71 68  Resp: 18 16  Temp:    SpO2: 99% 100%    Last Pain:  Vitals:   10/04/20 1242  TempSrc:   PainSc: 0-No pain                 Cleda Mccreedy Meggin Ola

## 2020-10-08 NOTE — Progress Notes (Signed)
Subjective:    Patient ID: Melanie Browning, female    DOB: 21-Apr-1941, 80 y.o.   MRN: [id number] Chief Complaint  Patient presents with  . Follow-up    2wk [facility] post LE  angio LLE  Arterial U/S     The patient returns to the office for followup and review status post angiogram with intervention. The patient notes improvement in the lower extremity symptoms. No interval shortening of the patient's claudication distance or rest pain symptoms. Previous wounds have now healed.  No new ulcers or wounds have occurred since the last visit.  There have been no significant changes to the patient's overall health care.  The patient denies amaurosis fugax or recent TIA symptoms. There are no recent neurological changes noted. The patient denies history of DVT, PE or superficial thrombophlebitis. The patient denies recent episodes of angina or shortness of breath.   ABI's Rt=1.28 and Lt=1.24  Duplex US of the right lower extremity has triphasic tibial artery waveforms.  The left lower extremity underwent arterial duplex which shows triphasic/biphasic waveforms throughout.  The left popliteal artery stent is patent with no evidence of reocclusion.   Review of Systems  All other systems reviewed and are negative.      Objective:   Physical Exam Vitals reviewed.  HENT:     Head: Normocephalic.  Cardiovascular:     Rate and Rhythm: Normal rate.     Pulses: Normal pulses.  Pulmonary:     Effort: Pulmonary effort is normal.  Skin:    General: Skin is warm and dry.  Neurological:     Mental Status: She is alert and oriented to person, place, and time.  Psychiatric:        Mood and Affect: Mood normal.        Behavior: Behavior normal.        Thought Content: Thought content normal.        Judgment: Judgment normal.     BP (!) 148/78   Pulse 61   Ht 5\' 8"  (1.727 m)   Wt 147 lb (66.7 kg)   BMI 22.35 kg/m   Past Medical History:  Diagnosis Date  . Arthritis   . Dry eye     bilateral when living in [facility]  . HPV (human papilloma virus) infection   . Hyperlipidemia   . Ischemia of left lower extremity   . Osteoporosis   . Polymyalgia rheumatica (HCC)   . Thyroid disease   . Vaginal dysplasia     Social History   Socioeconomic History  . Marital status: Married    Spouse name: Norm   . Number of children: 1  . Years of education: Not on file  . Highest education level: Not on file  Occupational History  . Occupation: retired   Tobacco Use  . Smoking status: Never Smoker  . Smokeless tobacco: Never Used  Vaping Use  . Vaping Use: Never used  Substance and Sexual Activity  . Alcohol use: Yes    Alcohol/week: 3.0 standard drinks    Types: 3 Glasses of wine per week    Comment: 3 glasses of wine per week   . Drug use: Never  . Sexual activity: Yes  Other Topics Concern  . Not on file  Social History Narrative   Lives with [facility] at [facility]    Social Determinants of Health   Financial Resource Strain: Not on file  Food Insecurity: Not on file  Transportation Needs: Not on file  Physical Activity: Not on file  Stress: Not on file  Social Connections: Not on file  Intimate Partner Violence: Not on file    Past Surgical History:  Procedure Laterality Date  . ABDOMINAL HYSTERECTOMY     complete  . COLONOSCOPY WITH PROPOFOL N/A 10/04/2020   Procedure: COLONOSCOPY WITH PROPOFOL;  Surgeon: Toledo, Boykin Nearing, MD;  Location: [facility] ENDOSCOPY;  Service: Gastroenterology;  Laterality: N/A;  . colposcobpy    . COLPOSCOPY    . knee athroscopy Left   . LAPAROSCOPIC HYSTERECTOMY    . LOWER EXTREMITY ANGIOGRAPHY Left 09/05/2020   Procedure: LOWER EXTREMITY ANGIOGRAPHY;  Surgeon: Renford Dills, MD;  Location: [facility] INVASIVE CV LAB;  Service: Cardiovascular;  Laterality: Left;    Family History  Problem Relation Age of Onset  . Cancer Mother   . Pancreatic cancer Mother   . Heart disease Father     Allergies  Allergen Reactions  .  Lactose Other (See Comments)    Gi upset and diarrhea     No flowsheet data found.    CMP     Component Value Date/Time   BUN 18 09/05/2020 0739   CREATININE 0.61 09/05/2020 0739   GFRNONAA >60 09/05/2020 0739     VAS [place] ABI WITH/WO TBI  Result Date: 10/02/2020 LOWER EXTREMITY DOPPLER STUDY Indications: S/P Surgery.  Vascular Interventions: 10/13/2019: Rt Lower Extremity Arteriogram via Rt Common                         Femoral Artery Left Popliteal Balloon Angiplasty.                          10/14/2019: Lt Popliteal Artery Reconstruction.                          02/03/2020: Hybrid Left Lower Extremity Arteriogram via                         Right Common Femoral Artery Left Popliteal Balloon                         Angioplasty. Comparison Study: 05/11/2020 Performing Technologist: Reece Agar RT (R)(VS)  Examination Guidelines: A complete evaluation includes at minimum, Doppler waveform signals and systolic blood pressure reading at the level of bilateral brachial, anterior tibial, and posterior tibial arteries, when vessel segments are accessible. Bilateral testing is considered an integral part of a complete examination. Photoelectric Plethysmograph (PPG) waveforms and toe systolic pressure readings are included as required and additional duplex testing as needed. Limited examinations for reoccurring indications may be performed as noted.  ABI Findings: +---------+------------------+-----+---------+--------+ Right    Rt Pressure (mmHg)IndexWaveform Comment  +---------+------------------+-----+---------+--------+ Brachial 118                                      +---------+------------------+-----+---------+--------+ ATA      107               0.91 triphasic         +---------+------------------+-----+---------+--------+ PTA      151               1.28 triphasic         +---------+------------------+-----+---------+--------+ Skipper Cliche  0.69  Abnormal          +---------+------------------+-----+---------+--------+ +---------+------------------+-----+---------+-------+ Left     Lt Pressure (mmHg)IndexWaveform Comment +---------+------------------+-----+---------+-------+ Brachial 118                                     +---------+------------------+-----+---------+-------+ ATA      146               1.24 triphasic        +---------+------------------+-----+---------+-------+ PTA      142               1.20 triphasic        +---------+------------------+-----+---------+-------+ Great Toe107               0.91 Normal           +---------+------------------+-----+---------+-------+ +-------+-----------+-----------+------------+------------+ ABI/TBIToday's ABIToday's TBIPrevious ABIPrevious TBI +-------+-----------+-----------+------------+------------+ Right  1.28       .69        1.39        1.01         +-------+-----------+-----------+------------+------------+ Left   1.24       .91        1.26        1.09         +-------+-----------+-----------+------------+------------+ Bilateral ABIs appear essentially unchanged compared to prior study on 05/11/2020. Bilateral TBIs appear essentially unchanged compared to prior study on 05/11/2020.  Summary: Right: Resting right ankle-brachial index is within normal range. No evidence of significant right lower extremity arterial disease. The right toe-brachial index is normal. Left: Resting left ankle-brachial index is within normal range. No evidence of significant left lower extremity arterial disease. The left toe-brachial index is normal.  *See table(s) above for measurements and observations.  Electronically signed by Levora Dredge MD on 10/02/2020 at 5:19:11 PM.    Final        Assessment & Plan:   1. 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   The patient reports that the claudication symptoms and leg pain is essentially gone.   The patient denies lifestyle limiting changes at this point in time.  No further invasive studies, angiography or surgery at this time The patient should continue walking and begin a more formal exercise program.  The patient should continue antiplatelet therapy and aggressive treatment of the lipid abnormalities   The patient should continue wearing graduated compression socks 10-15 mmHg strength to control the mild edema.  Patient should undergo noninvasive studies as ordered. The patient will follow up with me after the studies.  Patient will follow-up in 3 months with noninvasive studies    Current Outpatient Medications on File Prior to Visit  Medication Sig Dispense Refill  . ascorbic acid (VITAMIN C) 1000 MG tablet Take 3,000 mg by mouth daily.     Marland Kitchen aspirin EC 81 MG tablet Take 81 mg by mouth every evening. Swallow whole.    Marland Kitchen atorvastatin (LIPITOR) 40 MG tablet Take 40 mg by mouth every evening.     . B Complex-C (B-COMPLEX WITH VITAMIN C) tablet Take 1 tablet by mouth daily.    . Budesonide ER 9 MG CP24 Take 9 mg by mouth every 3 (three) days.     . Cholecalciferol 125 MCG (5000 UT) capsule Take 5,000 Units by mouth daily.     . clopidogrel (PLAVIX)  75 MG tablet Take 1 tablet (75 mg total) by mouth daily. 30 tablet 5  . diphenoxylate-atropine (LOMOTIL) 2.5-0.025 MG tablet Take 1 tablet by mouth 4 (four) times daily as needed for diarrhea or loose stools.    Marland Kitchen levofloxacin (LEVAQUIN) 500 MG tablet Take 1 tablet (500 mg total) by mouth daily. Treat for 3 days may repeat times one for three more days.  If diarrhea persists contact primary provider (Patient taking differently: Take 500 mg by mouth daily. Treat for 3 days may repeat times one for three more days.  If diarrhea persists contact primary provider) 14 tablet 0  . levothyroxine (SYNTHROID) 75 MCG tablet Take 75 mcg by mouth daily before  breakfast.     . melatonin 5 MG TABS Take 5 mg by mouth at bedtime as needed (sleep).    . Multiple Vitamins-Minerals (ZINC PO) Take 1 tablet by mouth daily.    . Naproxen Sodium 220 MG CAPS Take 220-440 mg by mouth daily as needed (pain.).    Marland Kitchen niacin (SLO-NIACIN) 500 MG tablet Take 500 mg by mouth at bedtime.    Bertram Gala Glyc-Propyl Glyc PF (SYSTANE ULTRA PF) 0.4-0.3 % SOLN Place 1-2 drops into both eyes daily as needed (dry/irritated eyes.).     No current facility-administered medications on file prior to visit.    There are no Patient Instructions on file for this visit. No follow-ups on file.   Georgiana Spinner, NP

## 2020-10-09 ENCOUNTER — Encounter (INDEPENDENT_AMBULATORY_CARE_PROVIDER_SITE_OTHER): Payer: Self-pay | Admitting: Nurse Practitioner

## 2020-10-09 ENCOUNTER — Other Ambulatory Visit (HOSPITAL_COMMUNITY): Payer: Self-pay | Admitting: Internal Medicine

## 2020-10-09 DIAGNOSIS — Z7689 Persons encountering health services in other specified circumstances: Secondary | ICD-10-CM

## 2020-10-09 DIAGNOSIS — Z8679 Personal history of other diseases of the circulatory system: Secondary | ICD-10-CM

## 2020-10-23 ENCOUNTER — Other Ambulatory Visit (HOSPITAL_COMMUNITY): Payer: Self-pay | Admitting: Internal Medicine

## 2020-10-23 DIAGNOSIS — I25119 Atherosclerotic heart disease of native coronary artery with unspecified angina pectoris: Secondary | ICD-10-CM

## 2020-10-23 DIAGNOSIS — Z8679 Personal history of other diseases of the circulatory system: Secondary | ICD-10-CM

## 2020-10-25 ENCOUNTER — Inpatient Hospital Stay: Payer: Medicare Other | Attending: Obstetrics and Gynecology | Admitting: Obstetrics and Gynecology

## 2020-10-25 ENCOUNTER — Inpatient Hospital Stay: Payer: Medicare Other

## 2020-10-25 ENCOUNTER — Other Ambulatory Visit: Payer: Self-pay

## 2020-10-25 VITALS — BP 129/65 | HR 74 | Temp 98.7°F | Resp 20 | Wt 148.0 lb

## 2020-10-25 DIAGNOSIS — Z7902 Long term (current) use of antithrombotics/antiplatelets: Secondary | ICD-10-CM | POA: Insufficient documentation

## 2020-10-25 DIAGNOSIS — Z90722 Acquired absence of ovaries, bilateral: Secondary | ICD-10-CM | POA: Insufficient documentation

## 2020-10-25 DIAGNOSIS — E785 Hyperlipidemia, unspecified: Secondary | ICD-10-CM | POA: Insufficient documentation

## 2020-10-25 DIAGNOSIS — M810 Age-related osteoporosis without current pathological fracture: Secondary | ICD-10-CM | POA: Insufficient documentation

## 2020-10-25 DIAGNOSIS — N893 Dysplasia of vagina, unspecified: Secondary | ICD-10-CM

## 2020-10-25 DIAGNOSIS — Z90710 Acquired absence of both cervix and uterus: Secondary | ICD-10-CM | POA: Insufficient documentation

## 2020-10-25 DIAGNOSIS — Z79899 Other long term (current) drug therapy: Secondary | ICD-10-CM | POA: Insufficient documentation

## 2020-10-25 DIAGNOSIS — Z7982 Long term (current) use of aspirin: Secondary | ICD-10-CM | POA: Diagnosis not present

## 2020-10-25 DIAGNOSIS — M353 Polymyalgia rheumatica: Secondary | ICD-10-CM | POA: Diagnosis not present

## 2020-10-25 DIAGNOSIS — A630 Anogenital (venereal) warts: Secondary | ICD-10-CM | POA: Diagnosis not present

## 2020-10-25 DIAGNOSIS — E034 Atrophy of thyroid (acquired): Secondary | ICD-10-CM | POA: Insufficient documentation

## 2020-10-25 DIAGNOSIS — I872 Venous insufficiency (chronic) (peripheral): Secondary | ICD-10-CM | POA: Diagnosis not present

## 2020-10-25 DIAGNOSIS — I739 Peripheral vascular disease, unspecified: Secondary | ICD-10-CM | POA: Diagnosis not present

## 2020-10-25 NOTE — Progress Notes (Addendum)
GYN ONC Consult [facility]  [phone] [phone]  Patient Care Team: Mick Sell, MD as PCP - General (Infectious Diseases) Benita Gutter, RN as Oncology Nurse Navigator   Name of the patient: Melanie Browning  [id number]  [birth year]   Date of visit: 10/25/2020   Gynecologic Oncology Consult Visit   Referring Provider: Dr. 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 G1P1 female who is seen in consultation from Dr. Sampson Goon for vaginal dysplasia. She is s/p TAH-BSO on 09/05/2015, but final surgical path was negative for dysplasia. After surgery, she was closely monitored every 6 months with colposcopies and PAPs.   Today she reports no gyn symptoms.    She had colonoscopy for follow up of Lymphocytic colitis and that was stable.  Also having problems with intermittent claudication in left leg and underwent vascular percutaneous procedures including a popliteal stent in 12/21.  She is taking Plavix and ASA.    Gynecology Oncology History  She was treated in [place] for cervical and vaginal dysplasia from 2016 to 08/2019. She was referred to our clinic after her last visit at [facility] in [place].   TAH-BSO 09/05/2015  PAP 08/2016 LGSIL  PAP 02/09/2017 HSIL Colposcopy 02/2017 VAIN 1 PAP 05/2017 NILM 10/30/2017 ASCUS, HPV 16+ Colposcopy 11/26/2017 VAIN 1 Colposcopy 03/17/2018 No obvious disease noted (as per note from Dr. Pernell Dupre on 09/28/2019)  PAP 03/25/2019 LSIL/HPV+ Colposcopy with biopsy 04/20/2019 VAIN 1  PAP 09/21/2019, patient reports VAIN 1, HPV+  Problem List: Patient Active Problem List   Diagnosis Date Noted  . Chronic venous insufficiency 04/23/2020  . PAD (peripheral artery disease) (HCC) 04/19/2020  . Hyperlipidemia 04/19/2020  . Hypothyroidism 04/19/2020  . Popliteal artery injury, left, 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  . 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 Disruption  of external operation (surgical) wound, not elsewhere classified, initial encounter 11/09/2019  . 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 Stricture, artery (HCC) 10/29/2019  . Critical lower limb ischemia (HCC) 10/13/2019  . Hypothyroidism due to acquired atrophy of thyroid 04/07/2019  . Syncope and collapse 04/07/2019  . Senile osteoporosis 09/20/2016  . Sleep disorder 07/18/2016  . IGT (impaired glucose tolerance) 06/26/2015  . Pap smear abnormality of cervix with ASCUS favoring dysplasia 06/26/2015  . Family history of Guillain-Barre syndrome 04/26/2015  . History of herpes genitalis 04/26/2015  . History of HPV infection 04/26/2015  . Dyslipidemia 04/17/2015  . 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 PMR (polymyalgia rheumatica) (HCC) 04/17/2015  . Arthritis of shoulder region, 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   Past Medical History: Past Medical History:  Diagnosis Date  . Arthritis   . Dry eye    bilateral when living in [facility]  . HPV (human papilloma virus) infection   . Hyperlipidemia   . Ischemia of left lower extremity   . Osteoporosis   . Polymyalgia rheumatica (HCC)   . Thyroid disease   . Vaginal dysplasia    Past Surgical History: Past Surgical History:  Procedure Laterality Date  . ABDOMINAL HYSTERECTOMY     complete  . COLONOSCOPY WITH PROPOFOL N/A 10/04/2020   Procedure: COLONOSCOPY WITH PROPOFOL;  Surgeon: Toledo, Boykin Nearing, MD;  Location: [facility] ENDOSCOPY;  Service: Gastroenterology;  Laterality: N/A;  . colposcobpy    . COLPOSCOPY    . knee athroscopy Left   . LAPAROSCOPIC HYSTERECTOMY    . LOWER EXTREMITY ANGIOGRAPHY Left 09/05/2020   Procedure:  LOWER EXTREMITY ANGIOGRAPHY;  Surgeon: Renford Dills, MD;  Location: [facility] INVASIVE CV LAB;  Service: Cardiovascular;  Laterality: Left;   Past Gynecologic History:  Menarche: 14 Menstrual details: Lasts 3 days Menses regular: yes Last Menstrual Period:  Unknown History of OCP/HRT use: No History of Abnormal pap: yes, as per HPI Last pap: 08/2019 History of STDs: The patient reports a past history of: herpes. Sexually active: not asked  OB History:  OB History  No obstetric history on file.   Family History: Family History  Problem Relation Age of Onset  . Cancer Mother   . Pancreatic cancer Mother   . Heart disease Father    Social History: Social History   Socioeconomic History  . Marital status: Married    Spouse name: Norm   . Number of children: 1  . Years of education: Not on file  . Highest education level: Not on file  Occupational History  . Occupation: retired   Tobacco Use  . Smoking status: Never Smoker  . Smokeless tobacco: Never Used  Vaping Use  . Vaping Use: Never used  Substance and Sexual Activity  . Alcohol use: Yes    Alcohol/week: 3.0 standard drinks    Types: 3 Glasses of wine per week    Comment: 3 glasses of wine per week   . Drug use: Never  . Sexual activity: Yes  Other Topics Concern  . Not on file  Social History Narrative   Lives with spouse at [facility]    Social Determinants of Health   Financial Resource Strain: Not on file  Food Insecurity: Not on file  Transportation Needs: Not on file  Physical Activity: Not on file  Stress: Not on file  Social Connections: Not on file  Intimate Partner Violence: Not on file    Allergies: Allergies  Allergen Reactions  . Lactose Other (See Comments)    Gi upset and diarrhea     Current Medications: Current Outpatient Medications  Medication Sig Dispense Refill  . ascorbic acid (VITAMIN C) 1000 MG tablet Take 3,000 mg by mouth daily.     Marland Kitchen aspirin EC 81 MG tablet Take 81 mg by mouth every evening. Swallow whole.    Marland Kitchen atorvastatin (LIPITOR) 40 MG tablet Take 40 mg by mouth every evening.     . B Complex-C (B-COMPLEX WITH VITAMIN C) tablet Take 1 tablet by mouth daily.    . Budesonide ER 9 MG CP24 Take 9 mg by mouth every 3  (three) days.     . Cholecalciferol 125 MCG (5000 UT) capsule Take 5,000 Units by mouth daily.     . clopidogrel (PLAVIX) 75 MG tablet Take 1 tablet (75 mg total) by mouth daily. 30 tablet 5  . denosumab (PROLIA) 60 MG/ML SOSY injection Inject 60 mg into the skin every 6 (six) months. (Patient not taking: Reported on 10/04/2020)    . diphenoxylate-atropine (LOMOTIL) 2.5-0.025 MG tablet Take 1 tablet by mouth 4 (four) times daily as needed for diarrhea or loose stools.    . gabapentin (NEURONTIN) 100 MG capsule Take 100 mg by mouth daily as needed.    Marland Kitchen levofloxacin (LEVAQUIN) 500 MG tablet Take 1 tablet (500 mg total) by mouth daily. Treat for 3 days may repeat times one for three more days.  If diarrhea persists contact primary provider (Patient taking differently: Take 500 mg by mouth daily. Treat for 3 days may repeat times one for three more days.  If diarrhea persists contact  primary provider) 14 tablet 0  . levothyroxine (SYNTHROID) 75 MCG tablet Take 75 mcg by mouth daily before breakfast.     . Lifitegrast (XIIDRA) 5 % SOLN Apply 1 drop to eye in the morning and at bedtime.    . melatonin 5 MG TABS Take 5 mg by mouth at bedtime as needed (sleep).    . Multiple Vitamins-Minerals (ZINC PO) Take 1 tablet by mouth daily.    . Naproxen Sodium 220 MG CAPS Take 220-440 mg by mouth daily as needed (pain.).    Marland Kitchen niacin (SLO-NIACIN) 500 MG tablet Take 500 mg by mouth at bedtime.    Bertram Gala Glyc-Propyl Glyc PF (SYSTANE ULTRA PF) 0.4-0.3 % SOLN Place 1-2 drops into both eyes daily as needed (dry/irritated eyes.).    Marland Kitchen ZINC SULFATE PO Take by mouth daily as needed.     No current facility-administered medications for this visit.    Review of Systems General: negative for, fevers, chills, fatigue, changes in sleep Skin: some bruising Eyes: negative for, changes in vision, pain, diplopia HEENT: negative for, change in hearing, pain, discharge, tinnitus, vertigo, voice changes, sore throat, neck  masses Pulmonary: negative for, dyspnea, orthopnea, productive cough Cardiac: negative for, palpitations, syncope, pain, discomfort, pressure Gastrointestinal: negative for, dysphagia, nausea, vomiting, jaundice, pain, constipation, diarrhea, hematemesis, hematochezia Genitourinary/Sexual: negative for, dysuria, discharge, hesitancy, nocturia, retention, stones, infections, STD's, incontinence Ob/Gyn: negative for, irregular bleeding, pain Musculoskeletal: negative for, pain, stiffness, swelling, range of motion limitation Hematology: negative for, easy bruising, bleeding; positive for vaginal odor, reports using Replense  Neurologic/Psych: negative for, headaches, seizures, paralysis, weakness, tremor, change in gait, change in sensation, mood swings, depression, anxiety, change in memory  Objective:  Physical Examination:  BP 129/65   Pulse 74   Temp 98.7 F (37.1 C)   Resp 20   Wt 148 lb (67.1 kg)   SpO2 100%   BMI 22.50 kg/m    ECOG Performance Status: 1 - Symptomatic but completely ambulatory  General appearance: alert, cooperative and appears stated age HEENT:PERRLA and neck supple with midline trachea Lymph node survey: non-palpable, axillary, inguinal, supraclavicular Cardiovascular: regular rate and rhythm, no murmurs or gallops Respiratory: normal air entry, lungs clear to auscultation Breast exam: not examined Abdomen: soft, non-tender, without masses or organomegaly, no hernias and well healed incision Back: inspection of back is normal Extremities: extremities normal, atraumatic, no cyanosis or edema and healing popliteal vascular surgery present over Left posterior knee Skin exam - normal coloration and turgor, no rashes, no suspicious skin lesions noted. Neurological exam reveals alert, oriented, normal speech, no focal findings or movement disorder noted.  Pelvic: exam chaperoned by nurse;  Vulva: atrophic normal appearing vulva with no masses, tenderness or  lesions; Vagina: atrophic normal vagina; Bimanual: no masses. Rectal: deferred    Assessment:  Glendaly Rollie is a 80 y.o. 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 Asymptomatic    TAH-BSO 09/05/2015  Medical co-morbidities complicating care: PAD, Dyslipidemia  Plan:   Problem List Items Addressed This Visit      Genitourinary   Vaginal dysplasia - Primary     We discussed options for management including surveillance with HPV/PAP.  PAP/HPV done today.  Suggested return to clinic in 6 months.    The patient's diagnosis, an outline of the further diagnostic and laboratory studies which will be required, the recommendation, and alternatives were discussed.  All questions were answered to the patient's satisfaction.    Leida Lauth, MD

## 2020-10-30 LAB — IGP, APTIMA HPV: HPV Aptima: POSITIVE — AB

## 2020-10-31 ENCOUNTER — Telehealth: Payer: Self-pay | Admitting: Obstetrics and Gynecology

## 2020-10-31 NOTE — Telephone Encounter (Signed)
Call returned to Melanie Browning. She is aware that Dr. Johnnette Litter will review her results 11/01/20 and we will call her with the recommendations. She is anxious regarding her results.

## 2020-10-31 NOTE — Telephone Encounter (Signed)
Patient called requesting a call back from Dr. Annye Rusk NP about test results.  I informed the patient that I would send the request over to the NP and Navigator for follow up.

## 2020-10-31 NOTE — Telephone Encounter (Signed)
Recommend colposcopy. Called patient to discuss and left vm. Will schedule her for 1:00 tomorrow for colpo with Dr. Johnnette Litter.

## 2020-11-01 ENCOUNTER — Ambulatory Visit: Payer: Medicare Other

## 2020-11-01 ENCOUNTER — Inpatient Hospital Stay: Payer: Medicare Other | Attending: Obstetrics and Gynecology | Admitting: Obstetrics and Gynecology

## 2020-11-01 ENCOUNTER — Other Ambulatory Visit: Payer: Self-pay

## 2020-11-01 VITALS — BP 153/75 | HR 76 | Temp 97.8°F | Resp 20 | Wt 144.6 lb

## 2020-11-01 DIAGNOSIS — E785 Hyperlipidemia, unspecified: Secondary | ICD-10-CM | POA: Diagnosis not present

## 2020-11-01 DIAGNOSIS — Z7982 Long term (current) use of aspirin: Secondary | ICD-10-CM

## 2020-11-01 DIAGNOSIS — Z90722 Acquired absence of ovaries, bilateral: Secondary | ICD-10-CM | POA: Insufficient documentation

## 2020-11-01 DIAGNOSIS — Z90710 Acquired absence of both cervix and uterus: Secondary | ICD-10-CM | POA: Insufficient documentation

## 2020-11-01 DIAGNOSIS — M353 Polymyalgia rheumatica: Secondary | ICD-10-CM | POA: Insufficient documentation

## 2020-11-01 DIAGNOSIS — I70212 Atherosclerosis of native arteries of extremities with intermittent claudication, left leg: Secondary | ICD-10-CM | POA: Diagnosis not present

## 2020-11-01 DIAGNOSIS — N893 Dysplasia of vagina, unspecified: Secondary | ICD-10-CM | POA: Insufficient documentation

## 2020-11-01 DIAGNOSIS — Z7902 Long term (current) use of antithrombotics/antiplatelets: Secondary | ICD-10-CM | POA: Insufficient documentation

## 2020-11-01 DIAGNOSIS — E034 Atrophy of thyroid (acquired): Secondary | ICD-10-CM | POA: Insufficient documentation

## 2020-11-01 DIAGNOSIS — I872 Venous insufficiency (chronic) (peripheral): Secondary | ICD-10-CM | POA: Diagnosis not present

## 2020-11-01 DIAGNOSIS — Z79899 Other long term (current) drug therapy: Secondary | ICD-10-CM | POA: Insufficient documentation

## 2020-11-01 MED ORDER — FLUOROURACIL 5 % EX CREA: TOPICAL_CREAM | CUTANEOUS | 0 refills | Status: AC

## 2020-11-01 NOTE — Progress Notes (Signed)
GYN ONC Consult [facility]  Telephone:([phone] [phone]  Patient Care Team: Mick Sell, MD as PCP - General (Infectious Diseases) Benita Gutter, RN as Oncology Nurse Navigator   Name of the patient: Melanie Browning  [id number]  [birth year]   Date of visit: 11/01/2020   Gynecologic Oncology Consult Visit   Referring Provider: Dr. 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 She is s/p TAH-BSO on 09/05/2015, but final surgical path was negative for dysplasia.  After surgery, she was closely monitored every 6 months with colposcopies and PAPs.   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    She had colonoscopy for follow up of Lymphocytic colitis and that was stable.  Also having problems with intermittent claudication in left leg and underwent vascular percutaneous procedures including a popliteal stent in 12/21.  She is taking Plavix and ASA.    Gynecology Oncology History  She was treated in [place] for cervical and vaginal dysplasia from 2016 to 08/2019. She was referred to our clinic 7/21 after her last visit at [facility] in [place].   TAH-BSO 09/05/2015 PAP 08/2016 LGSIL  PAP 02/09/2017 HSIL Colposcopy 02/2017 VAIN 1 PAP 05/2017 NILM 10/30/2017 ASCUS, HPV 16+ Colposcopy 11/26/2017 VAIN 1 Colposcopy 03/17/2018 No obvious disease noted (as per note from Dr. Pernell Dupre on 09/28/2019)  PAP 03/25/2019 LSIL/HPV+ Colposcopy with biopsy 04/20/2019 VAIN 1  PAP 09/21/2019, patient reports VAIN 1, HPV+ Colposcopy 7/21 normal PAP 10/25/20 HSIL, +HR HPV  Problem List: Patient Active Problem List   Diagnosis Date Noted  . Chronic venous insufficiency 04/23/2020  . PAD (peripheral artery disease) (HCC) 04/19/2020  . Hyperlipidemia 04/19/2020  . Hypothyroidism 04/19/2020  . Popliteal artery  injury, left, 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  . 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 Disruption of external operation (surgical) wound, not elsewhere classified, initial encounter 11/09/2019  . 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 Stricture, artery (HCC) 10/29/2019  . Critical lower limb ischemia (HCC) 10/13/2019  . Hypothyroidism due to acquired atrophy of thyroid 04/07/2019  . Syncope and collapse 04/07/2019  . Senile osteoporosis 09/20/2016  . Sleep disorder 07/18/2016  . IGT (impaired glucose tolerance) 06/26/2015  . Pap smear abnormality of cervix with ASCUS favoring dysplasia 06/26/2015  . Family history of Guillain-Barre syndrome 04/26/2015  . History of herpes genitalis 04/26/2015  . History of HPV infection 04/26/2015  . Dyslipidemia 04/17/2015  . 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 PMR (polymyalgia rheumatica) (HCC) 04/17/2015  . Arthritis of shoulder region, 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   Past Medical History: Past Medical History:  Diagnosis Date  . Arthritis   . Dry eye    bilateral when living in [facility]  . HPV (human papilloma virus) infection   . Hyperlipidemia   . Ischemia of left lower extremity   . Osteoporosis   . Polymyalgia rheumatica (HCC)   . Thyroid disease   . Vaginal dysplasia    Past Surgical History: Past Surgical History:  Procedure Laterality Date  . ABDOMINAL HYSTERECTOMY     complete  . COLONOSCOPY WITH PROPOFOL N/A 10/04/2020   Procedure: COLONOSCOPY WITH PROPOFOL;  Surgeon: Toledo, Boykin Nearing, MD;  Location: [facility] ENDOSCOPY;  Service: Gastroenterology;  Laterality: N/A;  . colposcobpy    . COLPOSCOPY    . knee athroscopy Left   .  LAPAROSCOPIC HYSTERECTOMY    . LOWER EXTREMITY ANGIOGRAPHY Left 09/05/2020   Procedure: LOWER EXTREMITY ANGIOGRAPHY;  Surgeon: Renford Dills, MD;  Location: [facility] INVASIVE CV LAB;  Service: Cardiovascular;  Laterality: Left;   Past Gynecologic History:  Menarche:  14 Menstrual details: Lasts 3 days Menses regular: yes Last Menstrual Period: Unknown History of OCP/HRT use: No History of Abnormal pap: yes, as per HPI Last pap: 08/2019 History of STDs: The patient reports a past history of: herpes. Sexually active: not asked  OB History:  OB History  No obstetric history on file.   Family History: Family History  Problem Relation Age of Onset  . Cancer Mother   . Pancreatic cancer Mother   . Heart disease Father    Social History: Social History   Socioeconomic History  . Marital status: Married    Spouse name: Norm   . Number of children: 1  . Years of education: Not on file  . Highest education level: Not on file  Occupational History  . Occupation: retired   Tobacco Use  . Smoking status: Never Smoker  . Smokeless tobacco: Never Used  Vaping Use  . Vaping Use: Never used  Substance and Sexual Activity  . Alcohol use: Yes    Alcohol/week: 3.0 standard drinks    Types: 3 Glasses of wine per week    Comment: 3 glasses of wine per week   . Drug use: Never  . Sexual activity: Yes  Other Topics Concern  . Not on file  Social History Narrative   L[facility] with [facility] at [facility]    Social Determinants of Health   Financial Resource Strain: Not on file  Food Insecurity: Not on file  Transportation Needs: Not on file  Physical Activity: Not on file  Stress: Not on file  Social Connections: Not on file  Intimate Partner Violence: Not on file    Allergies: Allergies  Allergen Reactions  . Lactose Other (See Comments)    Gi upset and diarrhea     Current Medications: Current Outpatient Medications  Medication Sig Dispense Refill  . ascorbic acid (VITAMIN C) 1000 MG tablet Take 3,000 mg by mouth daily.     Marland Kitchen aspirin EC 81 MG tablet Take 81 mg by mouth every evening. Swallow whole.    Marland Kitchen atorvastatin (LIPITOR) 40 MG tablet Take 40 mg by mouth every evening.     . B Complex-C (B-COMPLEX WITH VITAMIN C) tablet Take 1  tablet by mouth daily.    . Budesonide ER 9 MG CP24 Take 9 mg by mouth every 3 (three) days.     . Cholecalciferol 125 MCG (5000 UT) capsule Take 5,000 Units by mouth daily.     . clopidogrel (PLAVIX) 75 MG tablet Take 1 tablet (75 mg total) by mouth daily. 30 tablet 5  . colesevelam (WELCHOL) 625 MG tablet Take by mouth.    . gabapentin (NEURONTIN) 100 MG capsule Take 100 mg by mouth daily as needed.    Marland Kitchen levofloxacin (LEVAQUIN) 500 MG tablet Take 1 tablet (500 mg total) by mouth daily. Treat for 3 days may repeat times one for three more days.  If diarrhea persists contact primary provider (Patient taking differently: Take 500 mg by mouth daily. Treat for 3 days may repeat times one for three more days.  If diarrhea persists contact primary provider) 14 tablet 0  . levothyroxine (SYNTHROID) 75 MCG tablet Take 75 mcg by mouth daily before breakfast.     .  melatonin 5 MG TABS Take 5 mg by mouth at bedtime as needed (sleep).    . niacin (SLO-NIACIN) 500 MG tablet Take 500 mg by mouth at bedtime.    Marland Kitchen ZINC SULFATE PO Take by mouth daily as needed.    Marland Kitchen denosumab (PROLIA) 60 MG/ML SOSY injection Inject 60 mg into the skin every 6 (six) months. (Patient not taking: Reported on 10/04/2020)    . diphenoxylate-atropine (LOMOTIL) 2.5-0.025 MG tablet Take 1 tablet by mouth 4 (four) times daily as needed for diarrhea or loose stools.    Marland Kitchen Lifitegrast (XIIDRA) 5 % SOLN Apply 1 drop to eye in the morning and at bedtime.    . Multiple Vitamins-Minerals (ZINC PO) Take 1 tablet by mouth daily.    . Naproxen Sodium 220 MG CAPS Take 220-440 mg by mouth daily as needed (pain.).    Marland Kitchen Polyethyl Glyc-Propyl Glyc PF (SYSTANE ULTRA PF) 0.4-0.3 % SOLN Place 1-2 drops into both eyes daily as needed (dry/irritated eyes.).     No current facility-administered medications for this visit.    Review of Systems General: negative for, fevers, chills, fatigue, changes in sleep Skin: some bruising Eyes: negative for, changes in  vision, pain, diplopia HEENT: negative for, change in hearing, pain, discharge, tinnitus, vertigo, voice changes, sore throat, neck masses Pulmonary: negative for, dyspnea, orthopnea, productive cough Cardiac: negative for, palpitations, syncope, pain, discomfort, pressure Gastrointestinal: negative for, dysphagia, nausea, vomiting, jaundice, pain, constipation, diarrhea, hematemesis, hematochezia Genitourinary/Sexual: negative for, dysuria, discharge, hesitancy, nocturia, retention, stones, infections, STD's, incontinence Ob/Gyn: negative for, irregular bleeding, pain Musculoskeletal: negative for, pain, stiffness, swelling, range of motion limitation Hematology: negative for, easy bruising, bleeding; positive for vaginal odor, reports using Replense  Neurologic/Psych: negative for, headaches, seizures, paralysis, weakness, tremor, change in gait, change in sensation, mood swings, depression, anxiety, change in memory  Objective:  Physical Examination:  BP (!) 153/75   Pulse 76   Temp 97.8 F (36.6 C)   Resp 20   Wt 144 lb 9.6 oz (65.6 kg)   SpO2 100%   BMI 21.99 kg/m    ECOG Performance Status: 1 - Symptomatic but completely ambulatory  General appearance: alert, cooperative and appears stated age HEENT:PERRLA and neck supple with midline trachea Lymph node survey: non-palpable, axillary, inguinal, supraclavicular Cardiovascular: regular rate and rhythm, no murmurs or gallops Respiratory: normal air entry, lungs clear to auscultation Breast exam: not examined Abdomen: soft, non-tender, without masses or organomegaly, no hernias and well healed incision Back: inspection of back is normal Extremities: extremities normal, atraumatic, no cyanosis or edema and healing popliteal vascular surgery present over Left posterior knee Skin exam - normal coloration and turgor, no rashes, no suspicious skin lesions noted. Neurological exam reveals alert, oriented, normal speech, no focal  findings or movement disorder noted.  Pelvic: exam chaperoned by nurse;  Vulva: atrophic normal appearing vulva with no masses, tenderness or lesions; Vagina: atrophic normal vagina; Bimanual: no masses. Rectal: deferred  Procedure: Colposcopy of vagina done with acetic acid after consent signed.  It was not difficulty to visualize the entire vaginal mucosa.  Atrophic vagina with no lesions seen.      Assessment:  Tanee Malinsky is a 80 y.o. female s/p TAH-BSO 09/05/2015 for dysplasia but no dysplasia in specimen.  Low grade PAP abnormalities since then with colposcopy negative.  PAP 12/20 VAIN1 HPV+.  Moved to this area and first seen 7/21 with normal colposcopy.  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  Returns for colposcopoy and no lesions seen  today.   No symptoms.   Medical co-morbidities complicating care: PAD, Dyslipidemia  Plan:   Problem List Items Addressed This Visit      Genitourinary   Vaginal dysplasia - Primary     We discussed options for management including surveillance versus Efudex treatment.  She is anxious.and would prefer treatment.  Will prescribe Efudex once a week for 10 weeks and then RTC in 1-2 months for repeat PAP.   The patient's diagnosis, an outline of the further diagnostic and laboratory studies which will be required, the recommendation, and alternatives were discussed.  All questions were answered to the patient's satisfaction.    Leida Lauth, MD

## 2020-11-01 NOTE — Patient Instructions (Signed)
Patient Information - Vaginal Efudex Cream ° °Efudex (5FU) cream is used in the vagina to treat condyloma (warts) or dysplasia (abnormal tissue) in the vagina. It works by causing a chemical burn, which in turn will cause the top layer of tissue to come off. Since the cream causes burns, it must be used with great care. Please follow these instructions closely. °1. Apply Desitin Ointment or Vaseline (bought at the drug store) around normal surfaces of the vaginal opening °2. At bedtime, insert applicator 1/3-1/2 full of 5-FU (1.5-2 grams) with cream deeply into the vagina. If you often get up in the middle of the night you can also insert a tampon to keep the cream from running out. We recommend using a tampon even if you don't get up in the middle of the night. °3. In the morning, remove the tampon, take a shower and gently wash the vulvar and vagina area well right away. You may also douche.  °4. With vaginal treatment anticipate a watery discharge during the time of treatment. °5. If you notice any soreness or rawness of the labia, keep the area coated with Desitin or Vaseline. If the sore or raw labia are allowed to come in direct contact, they may stick together. This may cause pain and/or difficulty urinating (peeing). °6. If you have soreness of the labia, you may soak in a warm tub of water for 20 minutes at a time or try putting ice on it. °7. Use Efudex at bedtime for once every week for 10 weeks. Do not use it during your period. Do not use it during the day. °8. Return to the clinic for an examination 2-4 weeks for an exam to make sure you are not developing an ulcer.  °9. Avoid sexual intercourse for at least 3 days after applying 5-fluorouracil.  °10. Do not hesitate to call the clinic if you have problems while using the medicine. ° °  °

## 2020-11-03 ENCOUNTER — Telehealth: Payer: Self-pay | Admitting: *Deleted

## 2020-11-03 NOTE — Telephone Encounter (Signed)
Patient calling to speak with you to give you update on her treatment, states she was told that you wanted her to call you weekly with [phone]

## 2020-11-06 NOTE — Telephone Encounter (Signed)
Spoke to patient. She is tolerating treatment well thus far. Follow up as planned.

## 2020-11-07 ENCOUNTER — Other Ambulatory Visit (HOSPITAL_COMMUNITY): Payer: Self-pay | Admitting: Emergency Medicine

## 2020-11-07 ENCOUNTER — Telehealth (HOSPITAL_COMMUNITY): Payer: Self-pay | Admitting: Emergency Medicine

## 2020-11-07 DIAGNOSIS — I70219 Atherosclerosis of native arteries of extremities with intermittent claudication, unspecified extremity: Secondary | ICD-10-CM

## 2020-11-07 MED ORDER — METOPROLOL TARTRATE 50 MG PO TABS: 50.0000 mg | ORAL_TABLET | Freq: Once | ORAL | 0 refills | Status: DC

## 2020-11-07 NOTE — Progress Notes (Signed)
One time dose 50mg  metoprolol tartrate ordered for HR control for upcoming cardiac CTA on 11/09/20  01/07/21 RN Navigator Cardiac Imaging [facility] [phone] Office  [phone] Cell

## 2020-11-07 NOTE — Telephone Encounter (Signed)
Reaching out to patient to offer assistance regarding upcoming cardiac imaging study; pt verbalizes understanding of appt date/time, parking situation and where to check in, pre-test NPO status and medications ordered, and verified current allergies; name and call back number provided for further questions should they arise Rockwell Alexandria RN Navigator Cardiac Imaging [facility] [phone] office [phone] cell  50mg  metoprolol tartrate PO 2hr PTA I called [facility] to arrange BMP to be drawn today or tomorrow.  Patient awaiting phone call from their office. Pt appreciated the phone call. 

## 2020-11-09 ENCOUNTER — Encounter (INDEPENDENT_AMBULATORY_CARE_PROVIDER_SITE_OTHER): Payer: Medicare Other

## 2020-11-09 ENCOUNTER — Ambulatory Visit
Admission: RE | Admit: 2020-11-09 | Discharge: 2020-11-09 | Disposition: A | Discharge status: Home / Self Care | Payer: Medicare Other | Source: Ambulatory Visit | Attending: Internal Medicine | Admitting: Internal Medicine

## 2020-11-09 ENCOUNTER — Ambulatory Visit (INDEPENDENT_AMBULATORY_CARE_PROVIDER_SITE_OTHER): Payer: Medicare Other | Admitting: Vascular Surgery

## 2020-11-09 ENCOUNTER — Other Ambulatory Visit: Payer: Self-pay

## 2020-11-09 DIAGNOSIS — Z8679 Personal history of other diseases of the circulatory system: Secondary | ICD-10-CM | POA: Diagnosis not present

## 2020-11-09 DIAGNOSIS — I25119 Atherosclerotic heart disease of native coronary artery with unspecified angina pectoris: Secondary | ICD-10-CM | POA: Diagnosis present

## 2020-11-09 DIAGNOSIS — I2510 Atherosclerotic heart disease of native coronary artery without angina pectoris: Secondary | ICD-10-CM

## 2020-11-09 HISTORY — DX: Atherosclerotic heart disease of native coronary artery without angina pectoris: I25.10

## 2020-11-09 MED ORDER — NITROGLYCERIN 0.4 MG SL SUBL
0.8000 mg | SUBLINGUAL_TABLET | Freq: Once | SUBLINGUAL | Status: AC
  Administered 2020-11-09: 0.8 mg via SUBLINGUAL

## 2020-11-09 MED ORDER — IOHEXOL 350 MG/ML SOLN
75.0000 mL | Freq: Once | INTRAVENOUS | Status: AC | PRN
  Administered 2020-11-09: 75 mL via INTRAVENOUS

## 2020-11-09 NOTE — Progress Notes (Signed)
Patient tolerated CT well. Drank water after. Vital signs stable encourage to drink water throughout day.Reasons explained and verbalized understanding. Ambulated steady gait.  

## 2020-11-10 ENCOUNTER — Other Ambulatory Visit
Admission: RE | Admit: 2020-11-10 | Discharge: 2020-11-10 | Disposition: A | Discharge status: Home / Self Care | Payer: Medicare Other | Source: Ambulatory Visit | Attending: Internal Medicine | Admitting: Internal Medicine

## 2020-11-10 DIAGNOSIS — Z79899 Other long term (current) drug therapy: Secondary | ICD-10-CM | POA: Diagnosis not present

## 2020-11-10 DIAGNOSIS — Z01818 Encounter for other preprocedural examination: Secondary | ICD-10-CM | POA: Diagnosis present

## 2020-11-10 DIAGNOSIS — I739 Peripheral vascular disease, unspecified: Secondary | ICD-10-CM | POA: Diagnosis not present

## 2020-11-10 LAB — BRAIN NATRIURETIC PEPTIDE: B Natriuretic Peptide: 195 pg/mL — ABNORMAL HIGH (ref 0.0–100.0)

## 2020-11-13 ENCOUNTER — Other Ambulatory Visit: Payer: Self-pay

## 2020-11-13 ENCOUNTER — Other Ambulatory Visit
Admission: RE | Admit: 2020-11-13 | Discharge: 2020-11-13 | Disposition: A | Discharge status: Home / Self Care | Payer: Medicare Other | Source: Ambulatory Visit | Attending: Internal Medicine | Admitting: Internal Medicine

## 2020-11-13 DIAGNOSIS — Z01812 Encounter for preprocedural laboratory examination: Secondary | ICD-10-CM | POA: Diagnosis present

## 2020-11-13 DIAGNOSIS — Z20822 Contact with and (suspected) exposure to covid-19: Secondary | ICD-10-CM | POA: Diagnosis not present

## 2020-11-14 LAB — SARS CORONAVIRUS 2 (TAT 6-24 HRS): SARS Coronavirus 2: NEGATIVE

## 2020-11-15 ENCOUNTER — Inpatient Hospital Stay (HOSPITAL_BASED_OUTPATIENT_CLINIC_OR_DEPARTMENT_OTHER): Payer: Medicare Other | Admitting: Nurse Practitioner

## 2020-11-15 ENCOUNTER — Encounter
Admission: RE | Disposition: A | Discharge status: Home / Self Care | Payer: Self-pay | Source: Home / Self Care | Attending: Internal Medicine

## 2020-11-15 ENCOUNTER — Observation Stay
Admission: RE | Admit: 2020-11-15 | Discharge: 2020-11-16 | Disposition: A | Discharge status: Home / Self Care | Payer: Medicare Other | Attending: Internal Medicine | Admitting: Internal Medicine

## 2020-11-15 ENCOUNTER — Encounter: Payer: Self-pay | Admitting: Internal Medicine

## 2020-11-15 ENCOUNTER — Other Ambulatory Visit: Payer: Self-pay

## 2020-11-15 VITALS — BP 163/81 | HR 64 | Temp 98.7°F | Resp 20 | Wt 143.9 lb

## 2020-11-15 DIAGNOSIS — E785 Hyperlipidemia, unspecified: Secondary | ICD-10-CM | POA: Diagnosis not present

## 2020-11-15 DIAGNOSIS — I10 Essential (primary) hypertension: Secondary | ICD-10-CM | POA: Insufficient documentation

## 2020-11-15 DIAGNOSIS — I739 Peripheral vascular disease, unspecified: Secondary | ICD-10-CM | POA: Diagnosis not present

## 2020-11-15 DIAGNOSIS — Z79899 Other long term (current) drug therapy: Secondary | ICD-10-CM | POA: Diagnosis not present

## 2020-11-15 DIAGNOSIS — N893 Dysplasia of vagina, unspecified: Secondary | ICD-10-CM | POA: Diagnosis not present

## 2020-11-15 DIAGNOSIS — I25119 Atherosclerotic heart disease of native coronary artery with unspecified angina pectoris: Secondary | ICD-10-CM | POA: Diagnosis not present

## 2020-11-15 DIAGNOSIS — I70219 Atherosclerosis of native arteries of extremities with intermittent claudication, unspecified extremity: Secondary | ICD-10-CM

## 2020-11-15 DIAGNOSIS — R931 Abnormal findings on diagnostic imaging of heart and coronary circulation: Secondary | ICD-10-CM | POA: Diagnosis not present

## 2020-11-15 DIAGNOSIS — Z955 Presence of coronary angioplasty implant and graft: Secondary | ICD-10-CM

## 2020-11-15 HISTORY — DX: Noninfective gastroenteritis and colitis, unspecified: K52.9

## 2020-11-15 HISTORY — PX: CORONARY STENT INTERVENTION: CATH118234

## 2020-11-15 HISTORY — PX: LEFT HEART CATH AND CORONARY ANGIOGRAPHY: CATH118249

## 2020-11-15 LAB — POCT ACTIVATED CLOTTING TIME: Activated Clotting Time: 422 seconds

## 2020-11-15 SURGERY — LEFT HEART CATH AND CORONARY ANGIOGRAPHY
Anesthesia: Moderate Sedation

## 2020-11-15 MED ORDER — MIDAZOLAM HCL 2 MG/2ML IJ SOLN
INTRAMUSCULAR | Status: AC
  Filled 2020-11-15: qty 2

## 2020-11-15 MED ORDER — BUDESONIDE 3 MG PO CPEP
9.0000 mg | ORAL_CAPSULE | Freq: Every day | ORAL | Status: DC
  Administered 2020-11-16: 9 mg via ORAL
  Filled 2020-11-15: qty 3

## 2020-11-15 MED ORDER — ASPIRIN 81 MG PO CHEW: 81.0000 mg | CHEWABLE_TABLET | ORAL | Status: AC

## 2020-11-15 MED ORDER — LIDOCAINE HCL (PF) 1 % IJ SOLN
INTRAMUSCULAR | Status: AC
  Filled 2020-11-15: qty 30

## 2020-11-15 MED ORDER — CLOPIDOGREL BISULFATE 75 MG PO TABS
ORAL_TABLET | ORAL | Status: AC
  Filled 2020-11-15: qty 4

## 2020-11-15 MED ORDER — CLOPIDOGREL BISULFATE 75 MG PO TABS
75.0000 mg | ORAL_TABLET | Freq: Every day | ORAL | Status: DC
  Administered 2020-11-16: 75 mg via ORAL
  Filled 2020-11-15: qty 1

## 2020-11-15 MED ORDER — ONDANSETRON HCL 4 MG/2ML IJ SOLN: 4.0000 mg | Freq: Four times a day (QID) | INTRAMUSCULAR | Status: DC | PRN

## 2020-11-15 MED ORDER — FENTANYL CITRATE (PF) 100 MCG/2ML IJ SOLN
INTRAMUSCULAR | Status: DC | PRN
  Administered 2020-11-15 (×2): 25 ug via INTRAVENOUS

## 2020-11-15 MED ORDER — SODIUM CHLORIDE 0.9% FLUSH: 3.0000 mL | INTRAVENOUS | Status: DC | PRN

## 2020-11-15 MED ORDER — ASPIRIN EC 81 MG PO TBEC: 81.0000 mg | DELAYED_RELEASE_TABLET | Freq: Every evening | ORAL | Status: DC

## 2020-11-15 MED ORDER — BIVALIRUDIN BOLUS VIA INFUSION - CUPID
INTRAVENOUS | Status: DC | PRN
  Administered 2020-11-15: 48.975 mg via INTRAVENOUS

## 2020-11-15 MED ORDER — LABETALOL HCL 5 MG/ML IV SOLN: 10.0000 mg | INTRAVENOUS | Status: AC | PRN

## 2020-11-15 MED ORDER — SODIUM CHLORIDE 0.9 % IV SOLN
250.0000 mL | INTRAVENOUS | Status: DC | PRN
Start: 2020-11-16 — End: 2020-11-16

## 2020-11-15 MED ORDER — LABETALOL HCL 5 MG/ML IV SOLN
INTRAVENOUS | Status: AC
  Filled 2020-11-15: qty 4

## 2020-11-15 MED ORDER — NIACIN ER 500 MG PO TBCR
500.0000 mg | EXTENDED_RELEASE_TABLET | Freq: Two times a day (BID) | ORAL | Status: DC
  Administered 2020-11-16: 500 mg via ORAL
  Filled 2020-11-15 (×4): qty 1

## 2020-11-15 MED ORDER — LIDOCAINE HCL (PF) 1 % IJ SOLN
INTRAMUSCULAR | Status: DC | PRN
  Administered 2020-11-15: 15 mL

## 2020-11-15 MED ORDER — BIVALIRUDIN TRIFLUOROACETATE 250 MG IV SOLR
INTRAVENOUS | Status: AC
  Filled 2020-11-15: qty 250

## 2020-11-15 MED ORDER — VITAMIN D 25 MCG (1000 UNIT) PO TABS
5000.0000 [IU] | ORAL_TABLET | Freq: Two times a day (BID) | ORAL | Status: DC
  Administered 2020-11-15 – 2020-11-16 (×2): 5000 [IU] via ORAL
  Filled 2020-11-15 (×2): qty 5

## 2020-11-15 MED ORDER — FLUOROURACIL 5 % EX CREA: TOPICAL_CREAM | CUTANEOUS | Status: DC

## 2020-11-15 MED ORDER — HEPARIN (PORCINE) IN NACL 1000-0.9 UT/500ML-% IV SOLN
INTRAVENOUS | Status: DC | PRN
  Administered 2020-11-15: 1000 mL

## 2020-11-15 MED ORDER — SODIUM CHLORIDE 0.9 % WEIGHT BASED INFUSION
1.0000 mL/kg/h | INTRAVENOUS | Status: DC
  Administered 2020-11-15: 1 mL/kg/h via INTRAVENOUS

## 2020-11-15 MED ORDER — POLYVINYL ALCOHOL 1.4 % OP SOLN
1.0000 [drp] | Freq: Every day | OPHTHALMIC | Status: DC | PRN
  Filled 2020-11-15: qty 15

## 2020-11-15 MED ORDER — LABETALOL HCL 5 MG/ML IV SOLN
INTRAVENOUS | Status: DC | PRN
  Administered 2020-11-15: 20 mg via INTRAVENOUS

## 2020-11-15 MED ORDER — TICAGRELOR 90 MG PO TABS
ORAL_TABLET | ORAL | Status: AC
  Filled 2020-11-15: qty 2

## 2020-11-15 MED ORDER — FENTANYL CITRATE (PF) 100 MCG/2ML IJ SOLN
INTRAMUSCULAR | Status: AC
  Filled 2020-11-15: qty 2

## 2020-11-15 MED ORDER — ACETAMINOPHEN 325 MG PO TABS: 650.0000 mg | ORAL_TABLET | ORAL | Status: DC | PRN

## 2020-11-15 MED ORDER — SODIUM CHLORIDE 0.9 % WEIGHT BASED INFUSION
3.0000 mL/kg/h | INTRAVENOUS | Status: AC
  Administered 2020-11-15: 3 mL/kg/h via INTRAVENOUS

## 2020-11-15 MED ORDER — ASPIRIN 81 MG PO CHEW
CHEWABLE_TABLET | ORAL | Status: AC
  Filled 2020-11-15: qty 3

## 2020-11-15 MED ORDER — SODIUM CHLORIDE 0.9% FLUSH
3.0000 mL | Freq: Two times a day (BID) | INTRAVENOUS | Status: DC
  Administered 2020-11-16: 3 mL via INTRAVENOUS

## 2020-11-15 MED ORDER — ATORVASTATIN CALCIUM 20 MG PO TABS
40.0000 mg | ORAL_TABLET | Freq: Every evening | ORAL | Status: DC
  Administered 2020-11-15: 40 mg via ORAL
  Filled 2020-11-15: qty 2

## 2020-11-15 MED ORDER — ASPIRIN 81 MG PO CHEW
81.0000 mg | CHEWABLE_TABLET | Freq: Every day | ORAL | Status: DC
  Administered 2020-11-16: 81 mg via ORAL
  Filled 2020-11-15: qty 1

## 2020-11-15 MED ORDER — VITAMIN B-12 1000 MCG PO TABS
1000.0000 ug | ORAL_TABLET | Freq: Every day | ORAL | Status: DC
  Administered 2020-11-16: 1000 ug via ORAL
  Filled 2020-11-15: qty 1

## 2020-11-15 MED ORDER — SODIUM CHLORIDE 0.9% FLUSH
3.0000 mL | Freq: Two times a day (BID) | INTRAVENOUS | Status: DC
  Administered 2020-11-15 – 2020-11-16 (×2): 3 mL via INTRAVENOUS

## 2020-11-15 MED ORDER — B COMPLEX-C PO TABS
1.0000 | ORAL_TABLET | Freq: Every day | ORAL | Status: DC
  Administered 2020-11-16: 1 via ORAL
  Filled 2020-11-15 (×2): qty 1

## 2020-11-15 MED ORDER — MIDAZOLAM HCL 2 MG/2ML IJ SOLN
INTRAMUSCULAR | Status: DC | PRN
  Administered 2020-11-15: 0.5 mg via INTRAVENOUS
  Administered 2020-11-15: 1 mg via INTRAVENOUS

## 2020-11-15 MED ORDER — CLOPIDOGREL BISULFATE 75 MG PO TABS
ORAL_TABLET | ORAL | Status: DC | PRN
  Administered 2020-11-15: 300 mg via ORAL

## 2020-11-15 MED ORDER — BRIMONIDINE TARTRATE 0.025 % OP SOLN: 1.0000 [drp] | Freq: Every day | OPHTHALMIC | Status: DC

## 2020-11-15 MED ORDER — ASPIRIN 81 MG PO CHEW
CHEWABLE_TABLET | ORAL | Status: DC | PRN
  Administered 2020-11-15: 243 mg via ORAL

## 2020-11-15 MED ORDER — HEPARIN (PORCINE) IN NACL 1000-0.9 UT/500ML-% IV SOLN
INTRAVENOUS | Status: AC
  Filled 2020-11-15: qty 1000

## 2020-11-15 MED ORDER — COLESEVELAM HCL 625 MG PO TABS
1875.0000 mg | ORAL_TABLET | Freq: Two times a day (BID) | ORAL | Status: DC
  Administered 2020-11-15 – 2020-11-16 (×2): 1875 mg via ORAL
  Filled 2020-11-15 (×3): qty 3

## 2020-11-15 MED ORDER — SODIUM CHLORIDE 0.9 % IV SOLN
INTRAVENOUS | Status: DC | PRN
  Administered 2020-11-15: 1.75 mg/kg/h via INTRAVENOUS

## 2020-11-15 MED ORDER — LEVOTHYROXINE SODIUM 75 MCG PO TABS
75.0000 ug | ORAL_TABLET | Freq: Every day | ORAL | Status: DC
  Administered 2020-11-16: 75 ug via ORAL
  Filled 2020-11-15: qty 1

## 2020-11-15 MED ORDER — ASCORBIC ACID 500 MG PO TABS
1000.0000 mg | ORAL_TABLET | Freq: Two times a day (BID) | ORAL | Status: DC
  Administered 2020-11-15 – 2020-11-16 (×2): 1000 mg via ORAL
  Filled 2020-11-15 (×2): qty 2

## 2020-11-15 MED ORDER — SODIUM CHLORIDE 0.9 % WEIGHT BASED INFUSION: 1.0000 mL/kg/h | INTRAVENOUS | Status: AC

## 2020-11-15 MED ORDER — MELATONIN 5 MG PO TABS: 5.0000 mg | ORAL_TABLET | Freq: Every evening | ORAL | Status: DC | PRN

## 2020-11-15 MED ORDER — HYDRALAZINE HCL 20 MG/ML IJ SOLN: 10.0000 mg | INTRAMUSCULAR | Status: AC | PRN

## 2020-11-15 MED ORDER — SODIUM CHLORIDE 0.9 % IV SOLN: 250.0000 mL | INTRAVENOUS | Status: DC | PRN

## 2020-11-15 MED ORDER — ASPIRIN 81 MG PO CHEW
CHEWABLE_TABLET | ORAL | Status: AC
  Administered 2020-11-15: 81 mg via ORAL
  Filled 2020-11-15: qty 1

## 2020-11-15 MED ORDER — IOHEXOL 300 MG/ML  SOLN
INTRAMUSCULAR | Status: DC | PRN
  Administered 2020-11-15: 190 mL

## 2020-11-15 SURGICAL SUPPLY — 17 items
BALLN TREK RX 2.5X15 (BALLOONS) ×2
BALLN ~~LOC~~ TREK RX 2.75X12 (BALLOONS) ×2
BALLOON TREK RX 2.5X15 (BALLOONS) ×1 IMPLANT
BALLOON ~~LOC~~ TREK RX 2.75X12 (BALLOONS) ×1 IMPLANT
CATH INFINITI 5FR JL4 (CATHETERS) ×2 IMPLANT
CATH INFINITI JR4 5F (CATHETERS) ×2 IMPLANT
CATH VISTA GUIDE 6FR XB3.5 SH (CATHETERS) ×2 IMPLANT
DEVICE CLOSURE MYNXGRIP 6/7F (Vascular Products) ×2 IMPLANT
KIT ENCORE 26 ADVANTAGE (KITS) ×2 IMPLANT
KIT MANI 3VAL PERCEP (MISCELLANEOUS) ×2 IMPLANT
NEEDLE PERC 18GX7CM (NEEDLE) ×2 IMPLANT
PACK CARDIAC CATH (CUSTOM PROCEDURE TRAY) ×2 IMPLANT
SHEATH AVANTI 5FR X 11CM (SHEATH) ×2 IMPLANT
SHEATH AVANTI 6FR X 11CM (SHEATH) ×2 IMPLANT
STENT RESOLUTE ONYX 2.75X18 (Permanent Stent) ×2 IMPLANT
WIRE G HI TQ BMW 190 (WIRE) ×2 IMPLANT
WIRE GUIDERIGHT .035X150 (WIRE) ×2 IMPLANT

## 2020-11-15 NOTE — H&P (Signed)
H&P update Pre-cath Angina/Abnormal cardiac CT 11/15/20  Patient pre -op for cardiac cath. She has a history of Hyperlipidemia PAD PMR who recently had a CTA cardiac with suggested probable LAD disease.  PE Exam is unchanged   Imp CAD PAD HTN Hyperlipidemia  Plan Outpt cardiac cath

## 2020-11-15 NOTE — Progress Notes (Signed)
GYN ONC Interval Visit [facility]  [phone] [phone]  Patient Care Team: Mick Sell, MD as PCP - General (Infectious Diseases) Benita Gutter, RN as Oncology Nurse Navigator   Name of the patient: Melanie Browning  [id number]  [birth year]   Date of visit: 11/15/2020  Referring Provider: Dr. 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 She is s/p TAH-BSO on 09/05/2015, but final surgical path was negative for dysplasia.  After surgery, she was closely monitored every 6 months with colposcopies and PAPs.   PAP 10/25/20 HSIL, +HR HPV  Started vaginal efudex on 11/02/20. Treatment day is Thursday. She has had 2 doses. Denies ulceration, pain, or burning. She is douching.   She is undergoing a left heart catheterization with stent placement later today. Her husband is currently hospitalized for skin infection.     Gynecology Oncology History  She was treated in [place] for cervical and vaginal dysplasia from 2016 to 08/2019. She was referred to our clinic 7/21 after her last visit at [facility] [place] in [place].   TAH-BSO 09/05/2015 PAP 08/2016 LGSIL  PAP 02/09/2017 HSIL Colposcopy 02/2017 VAIN 1 PAP 05/2017 NILM 10/30/2017 ASCUS, HPV 16+ Colposcopy 11/26/2017 VAIN 1 Colposcopy 03/17/2018 No obvious disease noted (as per note from Dr. Pernell Dupre on 09/28/2019)  PAP 03/25/2019 LSIL/HPV+ Colposcopy with biopsy 04/20/2019 VAIN 1  PAP 09/21/2019, patient reports VAIN 1, HPV+ Colposcopy 7/21 normal PAP 10/25/20 HSIL, +HR HPV  She had colonoscopy for follow up of Lymphocytic colitis and that was stable.  Also having problems with intermittent claudication in left leg and underwent vascular percutaneous procedures including a popliteal stent in 12/21.  She is taking Plavix and ASA.    Problem List: Patient Active  Problem List   Diagnosis Date Noted  . Chronic venous insufficiency 04/23/2020  . PAD (peripheral artery disease) (HCC) 04/19/2020  . Hyperlipidemia 04/19/2020  . Hypothyroidism 04/19/2020  . Popliteal artery injury, left, 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  . 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 Disruption of external operation (surgical) wound, not elsewhere classified, initial encounter 11/09/2019  . 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 Stricture, artery (HCC) 10/29/2019  . Critical lower limb ischemia (HCC) 10/13/2019  . Hypothyroidism due to acquired atrophy of thyroid 04/07/2019  . Syncope and collapse 04/07/2019  . Senile osteoporosis 09/20/2016  . Sleep disorder 07/18/2016  . IGT (impaired glucose tolerance) 06/26/2015  . Pap smear abnormality of cervix with ASCUS favoring dysplasia 06/26/2015  . Family history of Guillain-Barre syndrome 04/26/2015  . History of herpes genitalis 04/26/2015  . History of HPV infection 04/26/2015  . Dyslipidemia 04/17/2015  . 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 PMR (polymyalgia rheumatica) (HCC) 04/17/2015  . Arthritis of shoulder region, 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   Past Medical History: Past Medical History:  Diagnosis Date  . Arthritis   . Dry eye    bilateral when living in [facility]  . HPV (human papilloma virus) infection   . Hyperlipidemia   . Ischemia of left lower extremity   . Osteoporosis   . Polymyalgia rheumatica (HCC)   . Thyroid disease   . Vaginal dysplasia    Past Surgical History: Past Surgical History:  Procedure Laterality Date  . ABDOMINAL HYSTERECTOMY     complete  . COLONOSCOPY WITH PROPOFOL N/A 10/04/2020   Procedure: COLONOSCOPY WITH PROPOFOL;  Surgeon: Toledo, Boykin Nearing, MD;  Location: [facility] ENDOSCOPY;  Service: Gastroenterology;  Laterality: N/A;  . colposcobpy    . COLPOSCOPY    . knee athroscopy Left   . LAPAROSCOPIC HYSTERECTOMY    . LOWER EXTREMITY ANGIOGRAPHY  Left 09/05/2020   Procedure: LOWER EXTREMITY ANGIOGRAPHY;  Surgeon: Renford Dills, MD;  Location: [facility] INVASIVE CV LAB;  Service: Cardiovascular;  Laterality: Left;   Past Gynecologic History:  Menarche: 14 Menstrual details: Lasts 3 days Menses regular: yes Last Menstrual Period: Unknown History of OCP/HRT use: No History of Abnormal pap: yes, as per HPI Last pap: 08/2019 History of STDs: The patient reports a past history of: herpes. Sexually active: not asked  OB History:  OB History  No obstetric history on file.   Family History: Family History  Problem Relation Age of Onset  . Cancer Mother   . Pancreatic cancer Mother   . Heart disease Father    Social History: Social History   Socioeconomic History  . Marital status: Married    Spouse name: Norm   . Number of children: 1  . Years of education: Not on file  . Highest education level: Not on file  Occupational History  . Occupation: retired   Tobacco Use  . Smoking status: Never Smoker  . Smokeless tobacco: Never Used  Vaping Use  . Vaping Use: Never used  Substance and Sexual Activity  . Alcohol use: Yes    Alcohol/week: 3.0 standard drinks    Types: 3 Glasses of wine per week    Comment: 3 glasses of wine per week   . Drug use: Never  . Sexual activity: Yes  Other Topics Concern  . Not on file  Social History Narrative   Lives with [facility] at [facility]    Social Determinants of Health   Financial Resource Strain: Not on file  Food Insecurity: Not on file  Transportation Needs: Not on file  Physical Activity: Not on file  Stress: Not on file  Social Connections: Not on file  Intimate Partner Violence: Not on file    Allergies: Allergies  Allergen Reactions  . Lactose Other (See Comments)    Gi upset and diarrhea     Current Medications: Current Outpatient Medications  Medication Sig Dispense Refill  . ascorbic acid (VITAMIN C) 1000 MG tablet Take 1,000 mg by mouth 2 (two) times  daily.    Marland Kitchen aspirin EC 81 MG tablet Take 81 mg by mouth every evening. Swallow whole.    Marland Kitchen atorvastatin (LIPITOR) 40 MG tablet Take 40 mg by mouth every evening.     . B Complex-C (B-COMPLEX WITH VITAMIN C) tablet Take 1 tablet by mouth daily.    . Brimonidine Tartrate (LUMIFY) 0.025 % SOLN Place 1 drop into both eyes daily.    . budesonide (ENTOCORT EC) 3 MG 24 hr capsule Take 9 mg by mouth daily.    . Cholecalciferol 125 MCG (5000 UT) capsule Take 5,000 Units by mouth in the morning and at bedtime.    . clopidogrel (PLAVIX) 75 MG tablet Take 1 tablet (75 mg total) by mouth daily. (Patient taking differently: Take 75 mg by mouth every evening.) 30 tablet 5  . colesevelam (WELCHOL) 625 MG tablet Take 1,875 mg by mouth in the morning and at bedtime.    . Cyanocobalamin (VITAMIN B-12 PO) Place 1 spray under the tongue 2 (two) times a week.    . fluorouracil (EFUDEX) 5 % cream Apply topically once a  week for 10 doses. Insert applicator 1/3-1/2 full of cream (1.5-2 grams) into vagina at night once a week for 10 weeks 40 g 0  . levothyroxine (SYNTHROID) 75 MCG tablet Take 75 mcg by mouth daily before breakfast.     . melatonin 5 MG TABS Take 5 mg by mouth at bedtime as needed (sleep).    . metoprolol tartrate (LOPRESSOR) 50 MG tablet Take 1 tablet (50 mg total) by mouth once for 1 dose. Please take one time dose 50mg  metoprolol tartrate 2 hr prior to cardiac CT for HR control IF HR >55bpm (Patient not taking: Reported on 11/10/2020) 1 tablet 0  . niacin (SLO-NIACIN) 500 MG tablet Take 500 mg by mouth in the morning and at bedtime.    Bertram Gala Glyc-Propyl Glyc PF (SYSTANE ULTRA PF) 0.4-0.3 % SOLN Place 1-2 drops into both eyes daily as needed (dry/irritated eyes.).    Marland Kitchen ZINC SULFATE PO Take 25 mg by mouth every evening.     No current facility-administered medications for this visit.   Review of Systems General:  No com[plaints Skin: no complaints Eyes: no complaints HEENT: no  complaints Breasts: no complaints Pulmonary: no complaints Cardiac: no complaints Gastrointestinal: no complaints Genitourinary/Sexual: no complaints Ob/Gyn: no complaints Musculoskeletal: no complaints Hematology: no complaints Neurologic/Psych: stress & anxiety   Objective:  Physical Examination:  BP (!) 163/81   Pulse 64   Temp 98.7 F (37.1 C)   Resp 20   Wt 143 lb 14.4 oz (65.3 kg)   SpO2 100%   BMI 21.88 kg/m    ECOG Performance Status: 1 - Symptomatic but completely ambulatory  GENERAL: Patient is a well appearing female in no acute distress EXTREMITIES:  No peripheral edema.   SKIN:  Clear with no obvious rashes or skin changes.  NEURO:  Nonfocal. Well oriented.  Appropriate affect.  Pelvic: exam chaperoned by nurse;  Vulva: atrophic normal appearing vulva. Varicose vein visible at 11:30. No tenderness or lesions. Vagina: atrophic. No bleeding, ulceration. Minimal erythema at top of vaginal cuff. Bimanual: no masses. Rectal: deferred     Assessment:  Kevan Deniston is a 80 y.o. female s/p TAH-BSO 09/05/2015 for dysplasia but no dysplasia in specimen.  Low grade PAP abnormalities since then with colposcopy negative.  PAP 12/20 VAIN1 HPV+.  Moved to this area and first seen 7/21 with normal colposcopy.  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 Negative colposcopy. Currently on vaginal efudex weekly. Tolerating well without significant side effects.    Medical co-morbidities complicating care: PAD, Dyslipidemia  Plan:   Problem List Items Addressed This Visit      Genitourinary   Vaginal dysplasia - Primary     Tolerating vaginal efudex well. Continue weekly treatment for a total of 10 weeks then plan to see her back for repeat pap 1-2 months after completion of treatment to allow for healing. Discussed that if she needs to take treatment breaks due to side effects, may need to push follow up appointment out to allow for 10 total treatments.   The patient's diagnosis, an outline of  the further diagnostic and laboratory studies which will be required, the recommendation, and alternatives were discussed.  All questions were answered to the patient's satisfaction.  Consuello Masse, DNP, AGNP-[facility] [phone] (clinic)

## 2020-11-16 ENCOUNTER — Encounter: Payer: Self-pay | Admitting: Internal Medicine

## 2020-11-16 DIAGNOSIS — I739 Peripheral vascular disease, unspecified: Secondary | ICD-10-CM | POA: Diagnosis not present

## 2020-11-16 NOTE — Progress Notes (Signed)
Pt ambulated 2 laps around the nursing station with no complaints. Dr.Callwood in to see pt and agreed to discharge. Discharge instructions explained/pt verbalized understanding. IV and tele removed. Will transport off unit via wheelchair when ride arrives.

## 2020-11-30 DIAGNOSIS — I2510 Atherosclerotic heart disease of native coronary artery without angina pectoris: Secondary | ICD-10-CM | POA: Insufficient documentation

## 2020-12-08 ENCOUNTER — Other Ambulatory Visit (INDEPENDENT_AMBULATORY_CARE_PROVIDER_SITE_OTHER): Payer: Self-pay | Admitting: Nurse Practitioner

## 2020-12-08 DIAGNOSIS — I70212 Atherosclerosis of native arteries of extremities with intermittent claudication, left leg: Secondary | ICD-10-CM

## 2020-12-10 DIAGNOSIS — I2510 Atherosclerotic heart disease of native coronary artery without angina pectoris: Secondary | ICD-10-CM | POA: Insufficient documentation

## 2020-12-10 NOTE — Progress Notes (Signed)
MRN : [id number]  Melanie Browning is a 80 y.o. (1941-07-01) female who presents with chief complaint of No chief complaint on file. Marland Kitchen  History of Present Illness: The patient returns to the office for followup and review of the noninvasive studies. There have been no interval changes in lower extremity symptoms. No interval shortening of the patient's claudication distance or development of rest pain symptoms. No new ulcers or wounds have occurred since the last visit.  There have been no significant changes to the patient's overall health care.  The patient denies amaurosis fugax or recent TIA symptoms. There are no recent neurological changes noted. The patient denies history of DVT, PE or superficial thrombophlebitis. The patient denies recent episodes of angina or shortness of breath.   ABI Rt=1.21 and Lt=1.15  (previous ABI's Rt=1.28 and Lt=1.24)   No outpatient medications have been marked as taking for the 12/11/20 encounter (Appointment) with Gilda Crease, Latina Craver, MD.    Past Medical History:  Diagnosis Date  . Arthritis   . Colitis   . Dry eye    bilateral when living in [facility]  . HPV (human papilloma virus) infection   . Hyperlipidemia   . Ischemia of left lower extremity   . Osteoporosis   . Polymyalgia rheumatica (HCC)   . Thyroid disease   . Vaginal dysplasia     Past Surgical History:  Procedure Laterality Date  . ABDOMINAL HYSTERECTOMY     complete  . COLONOSCOPY WITH PROPOFOL N/A 10/04/2020   Procedure: COLONOSCOPY WITH PROPOFOL;  Surgeon: Toledo, Boykin Nearing, MD;  Location: [facility] ENDOSCOPY;  Service: Gastroenterology;  Laterality: N/A;  . colposcobpy    . COLPOSCOPY    . CORONARY STENT INTERVENTION N/A 11/15/2020   Procedure: CORONARY STENT INTERVENTION;  Surgeon: Alwyn Pea, MD;  Location: [facility] INVASIVE CV LAB;  Service: Cardiovascular;  Laterality: N/A;  . knee athroscopy Left   . LAPAROSCOPIC HYSTERECTOMY    . LEFT HEART CATH AND CORONARY  ANGIOGRAPHY N/A 11/15/2020   Procedure: LEFT HEART CATH AND CORONARY ANGIOGRAPHY;  Surgeon: Alwyn Pea, MD;  Location: [facility] INVASIVE CV LAB;  Service: Cardiovascular;  Laterality: N/A;  . LOWER EXTREMITY ANGIOGRAPHY Left 09/05/2020   Procedure: LOWER EXTREMITY ANGIOGRAPHY;  Surgeon: Renford Dills, MD;  Location: [facility] INVASIVE CV LAB;  Service: Cardiovascular;  Laterality: Left;    Social History Social History   Tobacco Use  . Smoking status: Never Smoker  . Smokeless tobacco: Never Used  Vaping Use  . Vaping Use: Never used  Substance Use Topics  . Alcohol use: Not Currently    Alcohol/week: 3.0 standard drinks    Types: 3 Glasses of wine per week    Comment: 3 glasses of wine per week   . Drug use: Never    Family History Family History  Problem Relation Age of Onset  . Cancer Mother   . Pancreatic cancer Mother   . Heart disease Father     Allergies  Allergen Reactions  . Lactose Other (See Comments)    Gi upset and diarrhea      REVIEW OF SYSTEMS (Negative unless checked)  Constitutional: [] Weight loss  [] Fever  [] Chills Cardiac: [] Chest pain   [] Chest pressure   [] Palpitations   [] Shortness of breath when laying flat   [] Shortness of breath with exertion. Vascular:  [x] Pain in legs with walking   [] Pain in legs at rest  [] History of DVT   [] Phlebitis   [] Swelling in legs   []   Varicose veins   [] Non-healing ulcers Pulmonary:   [] Uses home oxygen   [] Productive cough   [] Hemoptysis   [] Wheeze  [] COPD   [] Asthma Neurologic:  [] Dizziness   [] Seizures   [] History of stroke   [] History of TIA  [] Aphasia   [] Vissual changes   [] Weakness or numbness in arm   [] Weakness or numbness in leg Musculoskeletal:   [] Joint swelling   [] Joint pain   [] Low back pain Hematologic:  [] Easy bruising  [] Easy bleeding   [] Hypercoagulable state   [] Anemic Gastrointestinal:  [] Diarrhea   [] Vomiting  [] Gastroesophageal reflux/heartburn   [] Difficulty swallowing. Genitourinary:   [] Chronic kidney disease   [] Difficult urination  [] Frequent urination   [] Blood in urine Skin:  [] Rashes   [] Ulcers  Psychological:  [] History of anxiety   []  History of major depression.  Physical Examination  There were no vitals filed for this visit. There is no height or weight on file to calculate BMI. Gen: WD/WN, NAD Head: [place]/AT, No temporalis wasting.  Ear/Nose/Throat: Hearing grossly intact, nares w/o erythema or drainage Eyes: PER, EOMI, sclera nonicteric.  Neck: Supple, no large masses.   Pulmonary:  Good air movement, no audible wheezing bilaterally, no use of accessory muscles.  Cardiac: RRR, no JVD Vascular:  Vessel Right Left  Radial Palpable Palpable  PT Palpable Palpable  DP Palpable Palpable  Gastrointestinal: Non-distended. No guarding/no peritoneal signs.  Musculoskeletal: M/S 5/5 throughout.  No deformity or atrophy.  Neurologic: CN 2-12 intact. Symmetrical.  Speech is fluent. Motor exam as listed above. Psychiatric: Judgment intact, Mood & affect appropriate for pt's clinical situation. Dermatologic: No rashes or ulcers noted.  No changes consistent with cellulitis. Lymph : No lichenification or skin changes of chronic lymphedema.  CBC No results found for: WBC, HGB, HCT, MCV, PLT  BMET    Component Value Date/Time   BUN 18 09/05/2020 0739   CREATININE 0.61 09/05/2020 0739   GFRNONAA >60 09/05/2020 0739   CrCl cannot be calculated (Patient's most recent lab result is older than the maximum 21 days allowed.).  COAG No results found for: INR, PROTIME  Radiology CARDIAC CATHETERIZATION  Result Date: 11/16/2020  Prox LAD lesion is 90% stenosed. Moderate calcification  A drug-eluting stent was successfully placed using a STENT RESOLUTE ONYX [id number].  Post intervention, there is a 0% residual stenosis.  Right coronary artery free of disease  Left main minor disease with moderate calcification  Conclusion Successful coronary artery evaluation Left main  relatively free of disease with moderate calcification LAD was large moderate calcification proximally with a 90% lesion proximally TIMI-3 flow Circumflex moderate size minor irregularities RCA minor irregularities Intervention Successful PCI and stent of proximal LAD with DES 2.75 x 18 mm resolute Onyx Postdilated with 2.75 x 12 mm  trek to 16 atm Patient is to be maintained on aspirin and Plavix indefinitely Anticipate discharge 23 hours     Assessment/Plan 1. 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  The patient reports that the claudication symptoms and leg pain is essentially gone.   The patient denies lifestyle limiting changes at this point in time.  No further invasive studies, angiography or surgery at this time The patient should continue walking and begin a more formal exercise program.  The patient should continue antiplatelet therapy and aggressive treatment of the lipid abnormalities  Smoking cessation was again discussed  Patient should undergo noninvasive studies as ordered. The  patient will follow up with me after the studies.   - VAS [place] ABI WITH/WO TBI; Future - VAS [place] LOWER EXTREMITY ARTERIAL DUPLEX; Future  2. Popliteal artery injury, left, sequela Recommend:  The patient is status post successful angiogram with intervention.  The patient reports that the claudication symptoms and leg pain is essentially gone.   The patient denies lifestyle limiting changes at this point in time.  No further invasive studies, angiography or surgery at this time The patient should continue walking and begin a more formal exercise program.  The patient should continue antiplatelet therapy and aggressive treatment of the lipid abnormalities  Smoking cessation was again discussed  Patient should undergo noninvasive studies as ordered. The patient will  follow up with me after the studies.  - VAS [place] ABI WITH/WO TBI; Future - VAS [place] LOWER EXTREMITY ARTERIAL DUPLEX; Future  3. Mixed hyperlipidemia Continue statin as ordered and reviewed, no changes at this time   4. Coronary artery disease of native artery of native heart with stable angina pectoris (HCC) Continue cardiac and antihypertensive medications as already ordered and reviewed, no changes at this time.  Continue statin as ordered and reviewed, no changes at this time  Nitrates PRN for chest pain    Levora Dredge, MD  12/10/2020 12:51 PM

## 2020-12-11 ENCOUNTER — Ambulatory Visit (INDEPENDENT_AMBULATORY_CARE_PROVIDER_SITE_OTHER): Payer: Medicare Other | Admitting: Vascular Surgery

## 2020-12-11 ENCOUNTER — Encounter (INDEPENDENT_AMBULATORY_CARE_PROVIDER_SITE_OTHER): Payer: Self-pay | Admitting: Vascular Surgery

## 2020-12-11 ENCOUNTER — Ambulatory Visit (INDEPENDENT_AMBULATORY_CARE_PROVIDER_SITE_OTHER): Payer: Medicare Other

## 2020-12-11 ENCOUNTER — Other Ambulatory Visit: Payer: Self-pay

## 2020-12-11 VITALS — BP 164/75 | HR 67 | Resp 16 | Wt 144.0 lb

## 2020-12-11 DIAGNOSIS — E782 Mixed hyperlipidemia: Secondary | ICD-10-CM

## 2020-12-11 DIAGNOSIS — I70212 Atherosclerosis of native arteries of extremities with intermittent claudication, left leg: Secondary | ICD-10-CM | POA: Diagnosis not present

## 2020-12-11 DIAGNOSIS — I25118 Atherosclerotic heart disease of native coronary artery with other forms of angina pectoris: Secondary | ICD-10-CM

## 2020-12-11 DIAGNOSIS — S85002S Unspecified injury of popliteal artery, left leg, sequela: Secondary | ICD-10-CM | POA: Diagnosis not present

## 2020-12-12 ENCOUNTER — Telehealth: Payer: Self-pay | Admitting: *Deleted

## 2020-12-12 NOTE — Telephone Encounter (Signed)
Spoke to patient. Significant discomfort with topical efudex. Currently s/p week 6 of treatment. Advised patient to take a week off and can consider re-starting next week. She will call me back to keep me updated so that we can adjust appointments as needed or see her in person if needed.

## 2020-12-12 NOTE — Telephone Encounter (Signed)
Patient has called again this morning asking to speak with Lauren about her chemotherapy treatment

## 2020-12-15 ENCOUNTER — Encounter (INDEPENDENT_AMBULATORY_CARE_PROVIDER_SITE_OTHER): Payer: Self-pay | Admitting: Vascular Surgery

## 2021-01-01 ENCOUNTER — Other Ambulatory Visit (INDEPENDENT_AMBULATORY_CARE_PROVIDER_SITE_OTHER): Payer: Self-pay | Admitting: Vascular Surgery

## 2021-01-04 NOTE — Discharge Summary (Addendum)
Physician Discharge Summary      Patient ID: Melanie Browning MRN: [id number] DOB/AGE: [birth year] 78 y.o.  Admit date: 11/15/2020 Discharge date: 11/16/20 Primary Discharge Diagnosis unstable angina coronary artery disease Secondary Discharge Diagnosis PCI and stent to proximal LAD DES  Significant Diagnostic Studies: angiography: Cardiac cath subsequent PCI and stent proximal LAD  Consults: None  Hospital Course: Patient was admitted as an outpatient for diagnostic cardiac cath was found to have probable high-grade stenosis and calcification on outpatient CTA.  Diagnostic cardiac cath showed 75% proximal LAD lesion which was successfully PCI stented with a DES stent resulting in TIMI-3 flow and no residual stenosis left ventricular function was well preserved the patient did reasonably well and was ready for discharge home 11/16/2020 without any symptoms or complications.  Patient was discharged home on aspirin 81 mg and Brilinta 90 mg twice a day both to be continued for 12 months then just aspirin thereafter other home medications which remain the same   Discharge Exam: Blood pressure 138/73, pulse 68, temperature 98.4 F (36.9 C), temperature source Oral, resp. rate 17, height 5\' 8"  (1.727 m), weight 64.9 kg, SpO2 96 %.   General appearance: appears stated age Neck: no adenopathy, no carotid bruit, no JVD, supple, symmetrical, trachea midline and thyroid not enlarged, symmetric, no tenderness/mass/nodules Resp: clear to auscultation bilaterally Chest wall: no tenderness Cardio: regular rate and rhythm, S1, S2 normal, no murmur, click, rub or gallop GI: soft, non-tender; bowel sounds normal; no masses,  no organomegaly Extremities: extremities normal, atraumatic, no cyanosis or edema Pulses: 2+ and symmetric Skin: Skin color, texture, turgor normal. No rashes or lesions Neurologic: Alert and oriented X 3, normal strength and tone. Normal symmetric reflexes. Normal coordination and  gait Labs:  No results found for: WBC, HGB, HCT, MCV, PLT No results for input(s): NA, K, CL, CO2, BUN, CREATININE, CALCIUM, PROT, BILITOT, ALKPHOS, ALT, AST, GLUCOSE in the last 168 hours.  Invalid input(s): LABALBU    Radiology: Cardiac cath PCI and stent EKG normal sinus rhythm nonspecific ST-T wave changes rate of 75  FOLLOW UP PLANS AND APPOINTMENTS Discharge Instructions    AMB Referral to Cardiac Rehabilitation - Phase II   Complete by: As directed    Diagnosis: Coronary Stents   After initial evaluation and assessments completed: Virtual Based Care may be provided alone or in conjunction with Phase 2 Cardiac Rehab based on patient barriers.: Yes     Allergies as of 11/16/2020      Reactions   Lactose Other (See Comments)   Gi upset and diarrhea      Medication List    TAKE these medications   ascorbic acid 1000 MG tablet Commonly known as: VITAMIN C Take 1,000 mg by mouth 2 (two) times daily.   aspirin EC 81 MG tablet Take 81 mg by mouth every evening. Swallow whole.   atorvastatin 40 MG tablet Commonly known as: LIPITOR Take 40 mg by mouth every evening.   B-complex with vitamin C tablet Take 1 tablet by mouth daily.   budesonide 3 MG 24 hr capsule Commonly known as: ENTOCORT EC Take 9 mg by mouth daily.   Cholecalciferol 125 MCG (5000 UT) capsule Take 5,000 Units by mouth in the morning and at bedtime.   colesevelam 625 MG tablet Commonly known as: WELCHOL Take 1,875 mg by mouth in the morning and at bedtime.   fluorouracil 5 % cream Commonly known as: Efudex Apply topically once a week for 10 doses. Insert applicator  1/3-1/2 full of cream (1.5-2 grams) into vagina at night once a week for 10 weeks   levothyroxine 75 MCG tablet Commonly known as: SYNTHROID Take 75 mcg by mouth daily before breakfast.   Lumify 0.025 % Soln Generic drug: Brimonidine Tartrate Place 1 drop into both eyes daily.   melatonin 5 MG Tabs Take 5 mg by mouth at bedtime  as needed (sleep).   niacin 500 MG tablet Commonly known as: SLO-NIACIN Take 500 mg by mouth in the morning and at bedtime.   Systane Ultra PF 0.4-0.3 % Soln Generic drug: Polyethyl Glyc-Propyl Glyc PF Place 1-2 drops into both eyes daily as needed (dry/irritated eyes.).   VITAMIN B-12 PO Place 1 spray under the tongue 2 (two) times a week.   ZINC SULFATE PO Take 25 mg by mouth every evening.        BRING ALL MEDICATIONS WITH YOU TO FOLLOW UP APPOINTMENTS  Time spent with patient to include physician time: 30 minutes Signed:  Alwyn Pea MD 01/04/2021, 9:56 AM

## 2021-01-09 ENCOUNTER — Telehealth (INDEPENDENT_AMBULATORY_CARE_PROVIDER_SITE_OTHER): Payer: Self-pay

## 2021-01-09 NOTE — Telephone Encounter (Signed)
Pt called and left a VM on the nurses line wanting to know should she take an antibiotic for her upcoming dental cleaning due to her having a previous stent placement Per Dr. Gilda Crease this is no longer routine and not needed I called the pt and made her aware.

## 2021-01-22 ENCOUNTER — Telehealth: Payer: Self-pay

## 2021-01-22 ENCOUNTER — Telehealth: Payer: Self-pay | Admitting: *Deleted

## 2021-01-22 NOTE — Telephone Encounter (Signed)
Patient's next appt is on 02/07/21. Plan is for pelvic exam at that time. If she has too much irritation, discomfort prior to that appointment, let me know and we can push it out a few weeks.

## 2021-01-22 NOTE — Telephone Encounter (Signed)
Call returned to patient and advised of Laurens response. Patient states that the appointment needs to be pushed out because she still has too much irritation and that she is going to be out of town until the 18th. I transferred her to the scheduler to change her appointment date

## 2021-01-22 NOTE — Telephone Encounter (Signed)
Patient called requesting a return call from Lauren to discuss next steps she is to take and when her next appointment is to be. [phone]

## 2021-01-22 NOTE — Telephone Encounter (Signed)
Received call from Melanie Browning requesting appointment on 5/11 to be moved out further due to treatment completion date and being out of town. Appointment rescheduled to 6/1, as she prefers to see Dr. Johnnette Litter.

## 2021-01-31 ENCOUNTER — Encounter: Payer: Self-pay | Admitting: *Deleted

## 2021-02-07 ENCOUNTER — Ambulatory Visit: Payer: Medicare Other

## 2021-02-20 ENCOUNTER — Telehealth: Payer: Self-pay | Admitting: Gastroenterology

## 2021-02-20 NOTE — Telephone Encounter (Signed)
Patient wants to see if the Provider can see her sooner than July 7,2022, patient also expressed concerns of her Lymphocytic colitis. Patient also stated that she will follow up with her PCP to see if there are any other options. Per patient.

## 2021-02-20 NOTE — Telephone Encounter (Signed)
July 7th is the first appointment that Dr. Allegra Lai has. Called and left a detail message explaining that this appointment was the first appointment available

## 2021-02-28 ENCOUNTER — Encounter: Payer: Self-pay | Admitting: Obstetrics and Gynecology

## 2021-02-28 ENCOUNTER — Inpatient Hospital Stay: Payer: Medicare Other | Attending: Obstetrics and Gynecology | Admitting: Obstetrics and Gynecology

## 2021-02-28 VITALS — BP 132/74 | HR 82 | Temp 98.2°F

## 2021-02-28 DIAGNOSIS — A630 Anogenital (venereal) warts: Secondary | ICD-10-CM | POA: Insufficient documentation

## 2021-02-28 DIAGNOSIS — I739 Peripheral vascular disease, unspecified: Secondary | ICD-10-CM | POA: Diagnosis not present

## 2021-02-28 DIAGNOSIS — E034 Atrophy of thyroid (acquired): Secondary | ICD-10-CM | POA: Diagnosis not present

## 2021-02-28 DIAGNOSIS — Z90722 Acquired absence of ovaries, bilateral: Secondary | ICD-10-CM | POA: Insufficient documentation

## 2021-02-28 DIAGNOSIS — Z7982 Long term (current) use of aspirin: Secondary | ICD-10-CM | POA: Diagnosis not present

## 2021-02-28 DIAGNOSIS — N893 Dysplasia of vagina, unspecified: Secondary | ICD-10-CM

## 2021-02-28 DIAGNOSIS — Z90710 Acquired absence of both cervix and uterus: Secondary | ICD-10-CM | POA: Insufficient documentation

## 2021-02-28 DIAGNOSIS — Z7901 Long term (current) use of anticoagulants: Secondary | ICD-10-CM | POA: Insufficient documentation

## 2021-02-28 DIAGNOSIS — E785 Hyperlipidemia, unspecified: Secondary | ICD-10-CM | POA: Insufficient documentation

## 2021-02-28 DIAGNOSIS — B009 Herpesviral infection, unspecified: Secondary | ICD-10-CM | POA: Diagnosis not present

## 2021-02-28 DIAGNOSIS — Z7902 Long term (current) use of antithrombotics/antiplatelets: Secondary | ICD-10-CM | POA: Insufficient documentation

## 2021-02-28 DIAGNOSIS — I2510 Atherosclerotic heart disease of native coronary artery without angina pectoris: Secondary | ICD-10-CM | POA: Insufficient documentation

## 2021-02-28 DIAGNOSIS — Z79899 Other long term (current) drug therapy: Secondary | ICD-10-CM | POA: Diagnosis not present

## 2021-02-28 DIAGNOSIS — M353 Polymyalgia rheumatica: Secondary | ICD-10-CM | POA: Insufficient documentation

## 2021-02-28 NOTE — Progress Notes (Signed)
GYN ONC Consult [facility]  [phone] [phone]  Patient Care Team: Mick Sell, MD as PCP - General (Infectious Diseases) Benita Gutter, RN as Oncology Nurse Navigator   Name of the patient: Melanie Browning  [id number]  1941/09/26   Date of visit: 02/28/2021   Gynecologic Oncology Consult Visit   Referring Provider: Dr. 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 She is s/p TAH-BSO on 09/05/2015, but final surgical path was negative for dysplasia.  After surgery, she was closely monitored every 6 months with colposcopies and PAPs.   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   Colposcopy 2/22 negative We discussed options for management including surveillance versus Efudex treatment.  She is anxious and preferred treatment.  We prescribed Efudex once a week for 10 weeks and then RTC in 1-2 months for repeat PAP. She completed this course with some interruptions due to vulvar irritation despite skin protectants.   Returns today for follow up exam and PAP.  She had colonoscopy for follow up of Lymphocytic colitis and that was stable.  Also having problems with intermittent claudication in left leg and underwent vascular percutaneous procedures including a popliteal stent in 12/21.  She is taking Plavix and ASA.    Gynecology Oncology History  She was treated in [place] for cervical and vaginal dysplasia from 2016 to 08/2019. She was referred to our clinic 7/21 after her last visit at [facility] in [place].   TAH-BSO 09/05/2015 PAP 08/2016 LGSIL  PAP 02/09/2017 HSIL Colposcopy 02/2017 VAIN 1 PAP 05/2017 NILM 10/30/2017 ASCUS, HPV 16+ Colposcopy 11/26/2017 VAIN 1 Colposcopy 03/17/2018 No obvious disease noted (as per note from Dr. Pernell Dupre on 09/28/2019)  PAP 03/25/2019  LSIL/HPV+ Colposcopy with biopsy 04/20/2019 VAIN 1  PAP 09/21/2019, patient reports VAIN 1, HPV+ Colposcopy 7/21 normal PAP 10/25/20 HSIL, +HR HPV  Problem List: Patient Active Problem List   Diagnosis Date Noted  . CAD (coronary artery disease) 12/10/2020  . S/P drug eluting coronary stent placement 11/15/2020  . Abdominal pain, LLQ (left lower quadrant) 09/28/2020  . Long term current use of anticoagulant therapy 09/28/2020  . Chronic venous insufficiency 04/23/2020  . PAD (peripheral artery disease) (HCC) 04/19/2020  . Hyperlipidemia 04/19/2020  . Hypothyroidism 04/19/2020  . Popliteal artery injury, left, 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  . 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 Disruption of external operation (surgical) wound, not elsewhere classified, initial encounter 11/09/2019  . 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 Stricture, artery (HCC) 10/29/2019  . Critical lower limb ischemia (HCC) 10/13/2019  . Hypothyroidism due to acquired atrophy of thyroid 04/07/2019  . Syncope and collapse 04/07/2019  . Senile osteoporosis 09/20/2016  . Sleep disorder 07/18/2016  . IGT (impaired glucose tolerance) 06/26/2015  . Pap smear abnormality of cervix with ASCUS favoring dysplasia 06/26/2015  . Family history of Guillain-Barre syndrome 04/26/2015  . History of herpes genitalis 04/26/2015  . History of HPV infection 04/26/2015  . Dyslipidemia 04/17/2015  . 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 PMR (polymyalgia rheumatica) (HCC) 04/17/2015  . Arthritis of shoulder region, 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   Past Medical History: Past Medical History:  Diagnosis Date  . Arthritis   . Colitis   . Dry eye    bilateral when living in [facility]  . HPV (human papilloma virus) infection   .  Hyperlipidemia   . Ischemia of left lower extremity   . Osteoporosis   . Polymyalgia rheumatica (HCC)   . Thyroid disease   . Vaginal dysplasia    Past Surgical  History: Past Surgical History:  Procedure Laterality Date  . ABDOMINAL HYSTERECTOMY     complete  . COLONOSCOPY WITH PROPOFOL N/A 10/04/2020   Procedure: COLONOSCOPY WITH PROPOFOL;  Surgeon: Toledo, Boykin Nearing, MD;  Location: [facility] ENDOSCOPY;  Service: Gastroenterology;  Laterality: N/A;  . colposcobpy    . COLPOSCOPY    . CORONARY STENT INTERVENTION N/A 11/15/2020   Procedure: CORONARY STENT INTERVENTION;  Surgeon: Alwyn Pea, MD;  Location: [facility] INVASIVE CV LAB;  Service: Cardiovascular;  Laterality: N/A;  . knee athroscopy Left   . LAPAROSCOPIC HYSTERECTOMY    . LEFT HEART CATH AND CORONARY ANGIOGRAPHY N/A 11/15/2020   Procedure: LEFT HEART CATH AND CORONARY ANGIOGRAPHY;  Surgeon: Alwyn Pea, MD;  Location: [facility] INVASIVE CV LAB;  Service: Cardiovascular;  Laterality: N/A;  . LOWER EXTREMITY ANGIOGRAPHY Left 09/05/2020   Procedure: LOWER EXTREMITY ANGIOGRAPHY;  Surgeon: Renford Dills, MD;  Location: [facility] INVASIVE CV LAB;  Service: Cardiovascular;  Laterality: Left;   Past Gynecologic History:  Menarche: 14 Menstrual details: Lasts 3 days Menses regular: yes Last Menstrual Period: Unknown History of OCP/HRT use: No History of Abnormal pap: yes, as per HPI Last pap: 08/2019 History of STDs: The patient reports a past history of: herpes. Sexually active: not asked  OB History:  OB History  No obstetric history on file.   Family History: Family History  Problem Relation Age of Onset  . Cancer Mother   . Pancreatic cancer Mother   . Heart disease Father    Social History: Social History   Socioeconomic History  . Marital status: Married    Spouse name: Norm   . Number of children: 1  . Years of education: Not on file  . Highest education level: Not on file  Occupational History  . Occupation: retired   Tobacco Use  . Smoking status: Never Smoker  . Smokeless tobacco: Never Used  Vaping Use  . Vaping Use: Never used  Substance and Sexual Activity  .  Alcohol use: Not Currently    Alcohol/week: 3.0 standard drinks    Types: 3 Glasses of wine per week    Comment: 3 glasses of wine per week   . Drug use: Never  . Sexual activity: Yes  Other Topics Concern  . Not on file  Social History Narrative   L[facility]    Social Determinants of Health   Financial Resource Strain: Not on file  Food Insecurity: Not on file  Transportation Needs: Not on file  Physical Activity: Not on file  Stress: Not on file  Social Connections: Not on file  Intimate Partner Violence: Not on file    Allergies: Allergies  Allergen Reactions  . Lactose Other (See Comments)    Gi upset and diarrhea     Current Medications: Current Outpatient Medications  Medication Sig Dispense Refill  . ascorbic acid (VITAMIN C) 1000 MG tablet Take 1,000 mg by mouth 2 (two) times daily.    Marland Kitchen aspirin EC 81 MG tablet Take 81 mg by mouth every evening. Swallow whole.    Marland Kitchen atorvastatin (LIPITOR) 40 MG tablet Take 40 mg by mouth every evening.     . B Complex-C (B-COMPLEX WITH VITAMIN C) tablet Take 1 tablet by mouth daily.    Marland Kitchen  Brimonidine Tartrate (LUMIFY) 0.025 % SOLN Place 1 drop into both eyes daily.    . budesonide (ENTOCORT EC) 3 MG 24 hr capsule Take 9 mg by mouth daily.    . Cholecalciferol 125 MCG (5000 UT) capsule Take 5,000 Units by mouth in the morning and at bedtime.    . clopidogrel (PLAVIX) 75 MG tablet TAKE 1 TABLET BY MOUTH DAILY 30 tablet 5  . colesevelam (WELCHOL) 625 MG tablet Take 1,875 mg by mouth in the morning and at bedtime.    . diphenoxylate-atropine (LOMOTIL) 2.5-0.025 MG tablet TAKE 1 TABLET BY MOUTH THREE TIMES DAILYAS NEEDED FOR DIARRHEA    . levothyroxine (SYNTHROID) 75 MCG tablet Take 75 mcg by mouth daily before breakfast.     . melatonin 5 MG TABS Take 5 mg by mouth at bedtime as needed (sleep).    . niacin (SLO-NIACIN) 500 MG tablet Take 500 mg by mouth in the morning and at bedtime.    Bertram Gala Glyc-Propyl Glyc  PF (SYSTANE ULTRA PF) 0.4-0.3 % SOLN Place 1-2 drops into both eyes daily as needed (dry/irritated eyes.).    Marland Kitchen ZINC SULFATE PO Take 25 mg by mouth every evening.     No current facility-administered medications for this visit.    Review of Systems General: negative for, fevers, chills, fatigue, changes in sleep Skin: some bruising Eyes: negative for, changes in vision, pain, diplopia HEENT: negative for, change in hearing, pain, discharge, tinnitus, vertigo, voice changes, sore throat, neck masses Pulmonary: negative for, dyspnea, orthopnea, productive cough Cardiac: negative for, palpitations, syncope, pain, discomfort, pressure Gastrointestinal: negative for, dysphagia, nausea, vomiting, jaundice, pain, constipation, diarrhea, hematemesis, hematochezia Genitourinary/Sexual: negative for, dysuria, discharge, hesitancy, nocturia, retention, stones, infections, STD's, incontinence Musculoskeletal: negative for, pain, stiffness, swelling, range of motion limitation Hematology: negative for, easy bruising, bleeding; positive for vaginal odor, reports using Replense  Neurologic/Psych: negative for, headaches, seizures, paralysis, weakness, tremor, change in gait, change in sensation, mood swings, depression, anxiety, change in memory  Objective:  Physical Examination:  BP 132/74   Pulse 82   Temp 98.2 F (36.8 C) (Tympanic)   SpO2 97%    ECOG Performance Status: 1 - Symptomatic but completely ambulatory  General appearance: alert, cooperative and appears stated age HEENT:PERRLA and neck supple with midline trachea Lymph node survey: non-palpable, axillary, inguinal, supraclavicular Cardiovascular: regular rate and rhythm, no murmurs or gallops Respiratory: normal air entry, lungs clear to auscultation Breast exam: not examined Abdomen: soft, non-tender, without masses or organomegaly, no hernias and well healed incision Back: inspection of back is normal Extremities: extremities  normal, atraumatic, no cyanosis or edema and healing popliteal vascular surgery present over Left posterior knee Skin exam - normal coloration and turgor, no rashes, no suspicious skin lesions noted. Neurological exam reveals alert, oriented, normal speech, no focal findings or movement disorder noted.  Pelvic: exam chaperoned by nurse;  Vulva: atrophic normal appearing vulva with no lesions, ulceration, tenderness; Vagina: atrophic, white, no ulceration or lesions; Bimanual: no masses. Rectal: deferred  Assessment:  Trae Yurchak is a 80 y.o. female s/p TAH-BSO 09/05/2015 for dysplasia but no dysplasia in specimen.  Low grade PAP abnormalities since then with colposcopy negative.  PAP 12/20 VAIN1 HPV+.  Moved to this area and first seen 7/21 with normal colposcopy.  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  Colposcopy of vagina 2/22 with no lesions seen.   No symptoms. We discussed options for management including surveillance versus Efudex treatment.  She is anxious.and preferred treatment.  She used  Efudex once a week for 10 weeks with some interruption for vulvar irritation.   Normal exam today and PAP done.   Medical co-morbidities complicating care: PAD, Dyslipidemia  Plan:   Problem List Items Addressed This Visit      Genitourinary   Vaginal dysplasia - Primary      Will follow up PAP result.  RTC in 6 months for repeat exam and PAP unless PAP today markedly abnormal.   The patient's diagnosis, an outline of the further diagnostic and laboratory studies which will be required, the recommendation, and alternatives were discussed.  All questions were answered to the patient's satisfaction.    Leida Lauth, MD

## 2021-02-28 NOTE — Addendum Note (Signed)
Addended by: Alinda Dooms on: 02/28/2021 02:12 PM   Modules accepted: Orders

## 2021-03-05 LAB — IGP, APTIMA HPV: HPV Aptima: NEGATIVE

## 2021-03-06 ENCOUNTER — Ambulatory Visit (INDEPENDENT_AMBULATORY_CARE_PROVIDER_SITE_OTHER): Payer: Medicare Other | Admitting: Gastroenterology

## 2021-03-06 ENCOUNTER — Encounter: Payer: Self-pay | Admitting: Gastroenterology

## 2021-03-06 ENCOUNTER — Other Ambulatory Visit: Payer: Self-pay

## 2021-03-06 VITALS — BP 139/86 | HR 85 | Temp 98.1°F | Ht 68.0 in | Wt 146.5 lb

## 2021-03-06 DIAGNOSIS — K52831 Collagenous colitis: Secondary | ICD-10-CM | POA: Diagnosis not present

## 2021-03-06 DIAGNOSIS — K529 Noninfective gastroenteritis and colitis, unspecified: Secondary | ICD-10-CM | POA: Diagnosis not present

## 2021-03-06 MED ORDER — AMITRIPTYLINE HCL 10 MG PO TABS: 10.0000 mg | ORAL_TABLET | Freq: Every day | ORAL | 0 refills | Status: DC

## 2021-03-06 NOTE — Progress Notes (Signed)
Melanie Repress, MD [address]  [address]  [place], [place] [address]  Main: [phone]  Fax: [phone]    Gastroenterology Consultation  Referring Provider:     Alwyn Pea, MD Primary Care Physician:  Melanie Sell, MD Primary Gastroenterologist:  Dr. Norma Fredrickson Reason for Consultation:     Collagenous colitis        HPI:   Melanie Browning is a 80 y.o. female referred by Dr. Sampson Browning, Stann Mainland, MD  for consultation & management of collagenous colitis.  Patient is here for second opinion regarding management of chronic diarrhea.  Patient reports that she has been diagnosed with lymphocytic colitis about 15 years ago and was treated with extended release budesonide along with Lomotil which brought her diarrhea into remission for several years.  She has been taking Lomotil as needed which helps with her diarrhea to some extent.  She has noticed flareup since October 2021.  Was originally evaluated by Dr. Norma Fredrickson, underwent colonoscopy with random colon biopsies, pathology confirmed collagenous colitis.  Patient was restarted on budesonide 3 mg 3 pills daily which she was taking until May 15.  She noticed modest improvement in her diarrhea.  She is currently experiencing 2-3 soft bowel movements daily on Lomotil 1-2 times daily.  She stopped budesonide as she is concerned about diagnosis of senile osteoporosis and waiting to be seen by endocrinology.  Patient was previously on Prolia injections.  She also tried [employer] as well as Colestid which did not seem to help  Patient has history of peripheral artery disease as well as coronary artery disease, recent PCI with stent placement on DAPT within last 1 year.  She finds her personal life to be stressful, she is the caregiver for her husband.  She did notice resolution of diarrhea when she went on a cruise with her girlfriend.  She does not sleep well. Patient takes melatonin, She denies any weight loss.  She does not smoke or  drink alcohol.  She does report lactose intolerance and tries to avoid daily.  She does report abdominal bloating as well as cramps, postprandial urgency.  She denies any rectal bleeding.  She is noticed to have macrocytosis without anemia.  TSH is normal, vitamin D normal.   NSAIDs: None  Antiplts/Anticoagulants/Anti thrombotics: Aspirin and Plavix for peripheral artery disease and coronary disease  GI Procedures: Colonoscopy 10/04/2020 - Internal hemorrhoids (Grade I) found on perianal exam. - Non-bleeding internal hemorrhoids. - Normal mucosa in the entire examined colon. Biopsied. DIAGNOSIS:  A. COLON, RANDOM; COLD BIOPSY:  - COLLAGENOUS COLITIS.  - NEGATIVE FOR DYSPLASIA AND MALIGNANCY.   Past Medical History:  Diagnosis Date  . Arthritis   . Colitis   . Dry eye    bilateral when living in [facility]  . HPV (human papilloma virus) infection   . Hyperlipidemia   . Ischemia of left lower extremity   . Osteoporosis   . Polymyalgia rheumatica (HCC)   . Thyroid disease   . Vaginal dysplasia     Past Surgical History:  Procedure Laterality Date  . ABDOMINAL HYSTERECTOMY     complete  . COLONOSCOPY WITH PROPOFOL N/A 10/04/2020   Procedure: COLONOSCOPY WITH PROPOFOL;  Surgeon: Toledo, Boykin Nearing, MD;  Location: [facility] ENDOSCOPY;  Service: Gastroenterology;  Laterality: N/A;  . colposcobpy    . COLPOSCOPY    . CORONARY STENT INTERVENTION N/A 11/15/2020   Procedure: CORONARY STENT INTERVENTION;  Surgeon: Alwyn Pea, MD;  Location: [facility] INVASIVE CV  LAB;  Service: Cardiovascular;  Laterality: N/A;  . knee athroscopy Left   . LAPAROSCOPIC HYSTERECTOMY    . LEFT HEART CATH AND CORONARY ANGIOGRAPHY N/A 11/15/2020   Procedure: LEFT HEART CATH AND CORONARY ANGIOGRAPHY;  Surgeon: Alwyn Pea, MD;  Location: [facility] INVAS[facility] CV LAB;  Service: Cardiovascular;  Laterality: N/A;  . LOWER EXTREMITY ANGIOGRAPHY Left 09/05/2020   Procedure: LOWER EXTREMITY ANGIOGRAPHY;  Surgeon: Renford Dills, MD;  Location: [facility] INVASIVE CV LAB;  Service: Cardiovascular;  Laterality: Left;    Current Outpatient Medications:  .  amitriptyline (ELAVIL) 10 MG tablet, Take 1 tablet (10 mg total) by mouth at bedtime., Disp: 30 tablet, Rfl: 0 .  ascorbic acid (VITAMIN C) 1000 MG tablet, Take 1,000 mg by mouth 2 (two) times daily., Disp: , Rfl:  .  aspirin EC 81 MG tablet, Take 81 mg by mouth every evening. Swallow whole., Disp: , Rfl:  .  atorvastatin (LIPITOR) 40 MG tablet, Take 40 mg by mouth every evening. , Disp: , Rfl:  .  B Complex-C (B-COMPLEX WITH VITAMIN C) tablet, Take 1 tablet by mouth daily., Disp: , Rfl:  .  Brimonidine Tartrate (LUMIFY) 0.025 % SOLN, Place 1 drop into both eyes daily., Disp: , Rfl:  .  Cholecalciferol 125 MCG (5000 UT) capsule, Take 5,000 Units by mouth in the morning and at bedtime., Disp: , Rfl:  .  clopidogrel (PLAVIX) 75 MG tablet, TAKE 1 TABLET BY MOUTH DAILY, Disp: 30 tablet, Rfl: 5 .  diphenoxylate-atropine (LOMOTIL) 2.5-0.025 MG tablet, TAKE 1 TABLET BY MOUTH THREE TIMES DAILYAS NEEDED FOR DIARRHEA, Disp: , Rfl:  .  levothyroxine (SYNTHROID) 75 MCG tablet, Take 75 mcg by mouth daily before breakfast. , Disp: , Rfl:  .  melatonin 5 MG TABS, Take 5 mg by mouth at bedtime as needed (sleep)., Disp: , Rfl:  .  niacin (SLO-NIACIN) 500 MG tablet, Take 500 mg by mouth in the morning and at bedtime., Disp: , Rfl:  .  Polyethyl Glyc-Propyl Glyc PF (SYSTANE ULTRA PF) 0.4-0.3 % SOLN, Place 1-2 drops into both eyes daily as needed (dry/irritated eyes.)., Disp: , Rfl:  .  ZINC SULFATE PO, Take 25 mg by mouth every evening., Disp: , Rfl:  .  budesonide (ENTOCORT EC) 3 MG 24 hr capsule, Take 9 mg by mouth daily. (Patient not taking: Reported on 03/06/2021), Disp: , Rfl:    Family History  Problem Relation Age of Onset  . Cancer Mother   . Pancreatic cancer Mother   . Heart disease Father      Social History   Tobacco Use  . Smoking status: Never Smoker  .  Smokeless tobacco: Never Used  Vaping Use  . Vaping Use: Never used  Substance Use Topics  . Alcohol use: Not Currently    Alcohol/week: 3.0 standard drinks    Types: 3 Glasses of wine per week    Comment: 3 glasses of wine per week   . Drug use: Never    Allergies as of 03/06/2021 - Review Complete 03/06/2021  Allergen Reaction Noted  . Lactose Other (See Comments) 08/30/2015    Review of Systems:    All systems reviewed and negative except where noted in HPI.   Physical Exam:  BP 139/86 (BP Location: Left Arm, Patient Position: Sitting, Cuff Size: Normal)   Pulse 85   Temp 98.1 F (36.7 C) (Oral)   Ht 5\' 8"  (1.727 m)   Wt 146 lb 8 oz (66.5 kg)  BMI 22.28 kg/m  No LMP recorded. Patient is postmenopausal.  General:   Alert,  Well-developed, well-nourished, pleasant and cooperative in NAD Head:  Normocephalic and atraumatic. Eyes:  Sclera clear, no icterus.   Conjunctiva pink. Ears:  Normal auditory acuity. Nose:  No deformity, discharge, or lesions. Mouth:  No deformity or lesions,oropharynx pink & moist. Neck:  Supple; no masses or thyromegaly. Lungs:  Respirations even and unlabored.  Clear throughout to auscultation.   No wheezes, crackles, or rhonchi. No acute distress. Heart:  Regular rate and rhythm; no murmurs, clicks, rubs, or gallops. Abdomen:  Normal bowel sounds. Soft, non-tender and mildly distended, tympanic to percussion without masses, hepatosplenomegaly or hernias noted.  No guarding or rebound tenderness.   Rectal: Not performed Msk:  Symmetrical without gross deformities. Good, equal movement & strength bilaterally. Pulses:  Normal pulses noted. Extremities:  No clubbing or edema.  No cyanosis. Neurologic:  Alert and oriented x3;  grossly normal neurologically. Skin:  Intact without significant lesions or rashes. No jaundice. Psych:  Alert and cooperative. Normal mood and affect.  Imaging Studies: No abdominal imaging  Assessment and Plan:    Leahmarie Gasiorowski is a 80 y.o. pleasant Caucasian female with history of coronary disease, peripheral artery disease s/p PCI with stent placement on DAPT, otherwise healthy, functionally independent, previous diagnosis of lymphocytic colitis, recent colonoscopy confirmed collagenous colitis is seen in consultation for management of chronic diarrhea  Chronic diarrhea: Likely combination of collagenous colitis and ongoing stress Okay to hold off budesonide at this time Continue Lomotil 2 to 3 pills daily as needed Discussed about trial of low-dose amitriptyline 10 mg at bedtime to control stress and patient is agreeable Screen for celiac disease  Macrocytosis without anemia Check B12 and folate levels   Follow up in 3 months   Melanie Repress, MD

## 2021-03-07 ENCOUNTER — Telehealth: Payer: Self-pay | Admitting: Nurse Practitioner

## 2021-03-07 DIAGNOSIS — N893 Dysplasia of vagina, unspecified: Secondary | ICD-10-CM

## 2021-03-07 NOTE — Telephone Encounter (Signed)
Pap shows hsil. Recommend colpo. Called patient. No answer. Left voicemail to return call.

## 2021-03-08 ENCOUNTER — Telehealth: Payer: Self-pay | Admitting: Obstetrics and Gynecology

## 2021-03-08 ENCOUNTER — Encounter: Payer: Self-pay | Admitting: Nurse Practitioner

## 2021-03-08 NOTE — Telephone Encounter (Signed)
Called patient x 3 with no answer. Left pt 2 messages to contact office to confirm appt time for 6/15 @ 12:00.

## 2021-03-12 ENCOUNTER — Encounter: Payer: Self-pay | Admitting: Gastroenterology

## 2021-03-12 LAB — CELIAC DISEASE PANEL
Endomysial IgA: NEGATIVE
IgA/Immunoglobulin A, Serum: 244 mg/dL (ref 64–422)
Transglutaminase IgA: <2 U/mL (ref 0–3)

## 2021-03-12 LAB — B12 AND FOLATE PANEL
Folate: >20 ng/mL (ref >3.0)
Vitamin B-12: 987 pg/mL (ref 232–1245)

## 2021-03-13 ENCOUNTER — Encounter: Payer: Self-pay | Admitting: Gastroenterology

## 2021-03-14 ENCOUNTER — Other Ambulatory Visit: Payer: Self-pay | Admitting: Nurse Practitioner

## 2021-03-14 ENCOUNTER — Inpatient Hospital Stay (HOSPITAL_BASED_OUTPATIENT_CLINIC_OR_DEPARTMENT_OTHER): Payer: Medicare Other | Admitting: Obstetrics and Gynecology

## 2021-03-14 ENCOUNTER — Encounter: Payer: Self-pay | Admitting: Nurse Practitioner

## 2021-03-14 ENCOUNTER — Encounter: Payer: Self-pay | Admitting: Obstetrics and Gynecology

## 2021-03-14 ENCOUNTER — Telehealth: Payer: Self-pay | Admitting: *Deleted

## 2021-03-14 VITALS — BP 136/81 | HR 83 | Temp 97.8°F | Resp 20 | Wt 145.0 lb

## 2021-03-14 DIAGNOSIS — N893 Dysplasia of vagina, unspecified: Secondary | ICD-10-CM | POA: Diagnosis not present

## 2021-03-14 MED ORDER — ESTROGENS, CONJUGATED 0.625 MG/GM VA CREA: TOPICAL_CREAM | VAGINAL | 0 refills | Status: DC

## 2021-03-14 MED ORDER — ESTRADIOL 0.1 MG/GM VA CREA: TOPICAL_CREAM | VAGINAL | 0 refills | Status: DC

## 2021-03-14 NOTE — Progress Notes (Signed)
GYN ONC Consult [facility]  [phone] [phone]  Patient Care Team: Mick Sell, MD as PCP - General (Infectious Diseases) Benita Gutter, RN as Oncology Nurse Navigator   Name of the patient: Melanie Browning  [id number]  [birth year]   Date of visit: 03/14/2021   Gynecologic Oncology Consult Visit   Referring Provider: Dr. 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 She is s/p TAH-BSO on 09/05/2015, but final surgical path was negative for dysplasia.  After surgery, she was closely monitored every 6 months with colposcopies and PAPs.   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   Colposcopy 2/22 negative We discussed options for management including surveillance versus Efudex treatment.  She was anxious and preferred treatment.  We prescribed Efudex once a week for 10 weeks and then RTC in 1-2 months for repeat PAP. She completed this course with some interruptions due to vulvar irritation despite skin protectants.   PAP 02/28/21 HSIL, HR HPV- No bleeding or discharge, slight odor.  She had colonoscopy for follow up of Lymphocytic colitis and that was stable.  Also having problems with intermittent claudication in left leg and underwent vascular percutaneous procedures including a popliteal stent in 12/21.  She is taking Plavix and ASA.    Gynecology Oncology History  She was treated in [place] for cervical and vaginal dysplasia from 2016 to 08/2019. She was referred to our clinic 7/21 after her last visit at [facility] in [place].   9/16 LSIL PAP, HR HPV+, Colposcopic biopsies 10/16, CIN1 on ectocervix, ECC negative. 11/16 [facility] biopsy LSIL, clear margins. ECC negative.  09/05/2015 TLH/BSO Dr Rowan Blase in [facility] for abnormal PAP. No residual disease in specimen. PAP 08/2016 LGSIL  PAP  02/09/2017 HSIL Colposcopy 02/2017 VAIN 1 PAP 05/2017 NILM 10/30/2017 ASCUS, HPV 16+ Colposcopy 11/26/2017 VAIN 1 Colposcopy 03/17/2018 No obvious disease noted (as per note from Dr. Pernell Dupre on 09/28/2019)  PAP 03/25/2019 LSIL/HPV+ Colposcopy with biopsy 04/20/2019 VAIN 1  PAP 09/21/2019, patient reports VAIN 1, HPV+ Colposcopy 7/21 normal PAP 10/25/20 HSIL, +HR HPV PAP 10/25/20 HSIL, -HR HPV  Problem List: Patient Active Problem List   Diagnosis Date Noted   CAD (coronary artery disease) 12/10/2020   Coronary artery disease involving native coronary artery of native heart 11/30/2020   S/P drug eluting coronary stent placement 11/15/2020   Abdominal pain, LLQ (left lower quadrant) 09/28/2020   Long term current use of anticoagulant therapy 09/28/2020   Functional diarrhea 09/28/2020   Chronic venous insufficiency 04/23/2020   PAD (peripheral artery disease) (HCC) 04/19/2020   Hyperlipidemia 04/19/2020   Hypothyroidism 04/19/2020   Popliteal artery injury, left, 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   Disruption of external operation (surgical) wound, not elsewhere classified, initial encounter 11/09/2019   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) 10/29/2019   Stricture, artery (HCC) 10/29/2019   Critical lower limb ischemia (HCC) 10/13/2019   Hypothyroidism due to acquired atrophy of thyroid 04/07/2019   Syncope and collapse 04/07/2019   Senile osteoporosis 09/20/2016   Sleep disorder 07/18/2016   IGT (impaired glucose tolerance) 06/26/2015   Pap smear abnormality of cervix with ASCUS favoring dysplasia 06/26/2015   Family history of Guillain-Barre syndrome 04/26/2015   History of herpes genitalis 04/26/2015   History of HPV infection 04/26/2015  Dyslipidemia 04/17/2015   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   PMR (polymyalgia rheumatica) (HCC) 04/17/2015   Arthritis of shoulder region, 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   Collagenous  colitis 04/17/2015   Past Medical History: Past Medical History:  Diagnosis Date   Arthritis    Colitis    [place]    [place]   HPV (human papilloma virus) infection    Hyperlipidemia    Ischemia of left lower extremity    Osteoporosis    Polymyalgia rheumatica (HCC)    Thyroid disease    Vaginal dysplasia    Past Surgical History: Past Surgical History:  Procedure Laterality Date   ABDOMINAL HYSTERECTOMY     complete   COLONOSCOPY WITH PROPOFOL N/A 10/04/2020   Procedure: COLONOSCOPY WITH PROPOFOL;  Surgeon: Toledo, Boykin Nearing, MD;  Location: [facility] ENDOSCOPY;  Service: Gastroenterology;  Laterality: N/A;   colposcobpy     COLPOSCOPY     CORONARY STENT INTERVENTION N/A 11/15/2020   Procedure: CORONARY STENT INTERVENTION;  Surgeon: Alwyn Pea, MD;  Location: [facility] INVASIVE CV LAB;  Service: Cardiovascular;  Laterality: N/A;   knee athroscopy Left    LAPAROSCOPIC HYSTERECTOMY     LEFT HEART CATH AND CORONARY ANGIOGRAPHY N/A 11/15/2020   Procedure: LEFT HEART CATH AND CORONARY ANGIOGRAPHY;  Surgeon: Alwyn Pea, MD;  Location: [facility] INVASIVE CV LAB;  Service: Cardiovascular;  Laterality: N/A;   LOWER EXTREMITY ANGIOGRAPHY Left 09/05/2020   Procedure: LOWER EXTREMITY ANGIOGRAPHY;  Surgeon: Renford Dills, MD;  Location: [facility] INVASIVE CV LAB;  Service: Cardiovascular;  Laterality: Left;   Past Gynecologic History:  Menarche: 14 Menstrual details: Lasts 3 days Menses regular: yes Last Menstrual Period: Unknown History of OCP/HRT use: No History of Abnormal pap: yes,  as per HPI Last pap:  08/2019 History of STDs: The patient reports a past history of: herpes. Sexually active: not asked  OB History:  OB History  No obstetric history on file.   Family History: Family History  Problem Relation Age of Onset   Cancer Mother    Pancreatic cancer Mother    Heart disease Father    Social History: Social History   Socioeconomic  History   Marital status: Married    Spouse name: Norm    Number of children: 1   Years of education: Not on file   Highest education level: Not on file  Occupational History   Occupation: retired   Tobacco Use   Smoking status: Never   Smokeless tobacco: Never  Vaping Use   Vaping Use: Never used  Substance and Sexual Activity   Alcohol use: Not Currently    Alcohol/week: 3.0 standard drinks    Types: 3 Glasses of wine per week    Comment: 3 glasses of wine per week    Drug use: Never   Sexual activity: Yes  Other Topics Concern   Not on file  Social History Narrative   L[facility]    Social Determinants of Health   Financial Resource Strain: Not on file  Food Insecurity: Not on file  Transportation Needs: Not on file  Physical Activity: Not on file  Stress: Not on file  Social Connections: Not on file  Intimate Partner Violence: Not on file    Allergies: Allergies  Allergen Reactions   Lactose Other (See Comments)    Gi upset and diarrhea     Current Medications: Current Outpatient Medications  Medication Sig Dispense Refill   amitriptyline (ELAVIL)  10 MG tablet Take 1 tablet (10 mg total) by mouth at bedtime. 30 tablet 0   ascorbic acid (VITAMIN C) 1000 MG tablet Take 1,000 mg by mouth 2 (two) times daily.     aspirin EC 81 MG tablet Take 81 mg by mouth every evening. Swallow whole.     atorvastatin (LIPITOR) 40 MG tablet Take 40 mg by mouth every evening.      B Complex-C (B-COMPLEX WITH VITAMIN C) tablet Take 1 tablet by mouth daily.     Brimonidine Tartrate (LUMIFY) 0.025 % SOLN Place 1 drop into both eyes daily.     Cholecalciferol 125 MCG (5000 UT) capsule Take 5,000 Units by mouth in the morning and at bedtime.     clopidogrel (PLAVIX) 75 MG tablet TAKE 1 TABLET BY MOUTH DAILY 30 tablet 5   diphenoxylate-atropine (LOMOTIL) 2.5-0.025 MG tablet TAKE 1 TABLET BY MOUTH THREE TIMES DAILYAS NEEDED FOR DIARRHEA     levothyroxine  (SYNTHROID) 75 MCG tablet Take 75 mcg by mouth daily before breakfast.      niacin (SLO-NIACIN) 500 MG tablet Take 500 mg by mouth in the morning and at bedtime.     Polyethyl Glyc-Propyl Glyc PF (SYSTANE ULTRA PF) 0.4-0.3 % SOLN Place 1-2 drops into both eyes daily as needed (dry/irritated eyes.).     ZINC SULFATE PO Take 25 mg by mouth every evening.     budesonide (ENTOCORT EC) 3 MG 24 hr capsule Take 9 mg by mouth daily. (Patient not taking: Reported on 03/06/2021)     melatonin 5 MG TABS Take 5 mg by mouth at bedtime as needed (sleep).     No current facility-administered medications for this visit.    Review of Systems General: negative for, fevers, chills, fatigue, changes in sleep Skin: some bruising Eyes: negative for, changes in vision, pain, diplopia HEENT: negative for, change in hearing, pain, discharge, tinnitus, vertigo, voice changes, sore throat, neck masses Pulmonary: negative for, dyspnea, orthopnea, productive cough Cardiac: negative for, palpitations, syncope, pain, discomfort, pressure Gastrointestinal: negative for, dysphagia, nausea, vomiting, jaundice, pain, constipation, diarrhea, hematemesis, hematochezia Genitourinary/Sexual: negative for, dysuria, discharge, hesitancy, nocturia, retention, stones, infections, STD's, incontinence Musculoskeletal: negative for, pain, stiffness, swelling, range of motion limitation Hematology: negative for, easy bruising, bleeding; positive for vaginal odor, reports using Replense  Neurologic/Psych: negative for, headaches, seizures, paralysis, weakness, tremor, change in gait, change in sensation, mood swings, depression, anxiety, change in memory  Objective:  Physical Examination:  BP 136/81   Pulse 83   Temp 97.8 F (36.6 C)   Resp 20   Wt 142 lb (64.4 kg)   SpO2 100%   BMI 21.59 kg/m    ECOG Performance Status: 1 - Symptomatic but completely ambulatory  General appearance: alert, cooperative and appears stated  age HEENT:PERRLA and neck supple with midline trachea Lymph node survey: non-palpable, axillary, inguinal, supraclavicular Cardiovascular: regular rate and rhythm, no murmurs or gallops Respiratory: normal air entry, lungs clear to auscultation Breast exam: not examined Abdomen: soft, non-tender, without masses or organomegaly, no hernias and well healed incision Back: inspection of back is normal Extremities: extremities normal, atraumatic, no cyanosis or edema and healing popliteal vascular surgery present over Left posterior knee Skin exam - normal coloration and turgor, no rashes, no suspicious skin lesions noted. Neurological exam reveals alert, oriented, normal speech, no focal findings or movement disorder noted.  Pelvic: exam chaperoned by nurse;  Vulva: atrophic normal appearing vulva with no lesions, ulceration, tenderness; Vagina: atrophic, white, no ulceration  or lesions; Bimanual: no masses. Rectal: deferred  Colposcopy: time out performed.  Vagina soaked with acetic acid and visualized with white and green light.  Very atrophic mucosa. No lesion seen today, but difficult to examine thoroughly due to narrow vagina.  Assessment:  Melanie Browning is a 80 y.o. female s/p TAH-BSO 09/05/2015 for dysplasia but no dysplasia in specimen.  Low grade PAP abnormalities since then with colposcopy negative.  PAP 12/20 VAIN1 HPV+.  Moved to this area and first seen 7/21 with normal colposcopy.  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  Colposcopy of vagina 2/22 with no lesions seen.   No symptoms. We discussed options for management including surveillance versus Efudex treatment.  She is anxious.and preferred treatment.  She used  Efudex once a week for 10 weeks with some interruption for vulvar irritation. PAP 02/28/21 HSIL, HR HPV-.  No visible lesion on colposcopy today.  Medical co-morbidities complicating care: PAD, Dyslipidemia  Plan:   Problem List Items Addressed This Visit       Genitourinary   Vaginal  dysplasia - Primary   Will have recent HSIL PAP reviewed at [facility].  Will treat with Premarin cream to try to improve atrophy and make evaluation of the vagina more feasible.  RTC in 6 weeks.  May repeat colposcopy with Lugol;s solution at that time.  She is very anxious about this and not sexually active, so upper vaginectomy may be an option.  If we decide to go in this direction, will probably have her see Dr Lafe Garin [facility] for this procedure.    25 minutes spent in direct care of this patient today.   The patient's diagnosis, an outline of the further diagnostic and laboratory studies which will be required, the recommendation, and alternatives were discussed.  All questions were answered to the patient's satisfaction.    Leida Lauth, MD

## 2021-03-14 NOTE — Telephone Encounter (Signed)
The Premarin cream even with insurance will cost patient $300 for 1 tube which she cannot afford, Pharmacy asking if you would change prescription to Estrace cream which would only cost $53 per tube. Please send in new prescription if in agreement.

## 2021-03-15 ENCOUNTER — Telehealth: Payer: Self-pay

## 2021-03-15 NOTE — Telephone Encounter (Signed)
Request sent to [facility] to have 02/28/21 pap smear sent to [facility] for review.

## 2021-03-23 ENCOUNTER — Telehealth: Payer: Self-pay

## 2021-03-23 NOTE — Telephone Encounter (Signed)
No accession noted in [facility] pathology for pap smear consult request. Follow up call placed to [facility]. Pap was shipped 03/16/21.They provided tracking number [id number]. It is reported delivered 03/16/21.

## 2021-03-29 ENCOUNTER — Other Ambulatory Visit: Payer: Self-pay | Admitting: Gastroenterology

## 2021-03-29 DIAGNOSIS — K529 Noninfective gastroenteritis and colitis, unspecified: Secondary | ICD-10-CM

## 2021-04-03 ENCOUNTER — Encounter: Payer: Self-pay | Admitting: Gastroenterology

## 2021-04-03 DIAGNOSIS — K529 Noninfective gastroenteritis and colitis, unspecified: Secondary | ICD-10-CM

## 2021-04-04 MED ORDER — AMITRIPTYLINE HCL 10 MG PO TABS: 10.0000 mg | ORAL_TABLET | Freq: Every day | ORAL | 0 refills | Status: DC

## 2021-04-04 NOTE — Telephone Encounter (Signed)
Sent medication to the pharmacy  

## 2021-04-05 ENCOUNTER — Ambulatory Visit: Payer: Medicare Other | Admitting: Gastroenterology

## 2021-04-05 ENCOUNTER — Other Ambulatory Visit: Payer: Self-pay

## 2021-04-05 ENCOUNTER — Ambulatory Visit (INDEPENDENT_AMBULATORY_CARE_PROVIDER_SITE_OTHER): Payer: Medicare Other

## 2021-04-05 ENCOUNTER — Encounter (INDEPENDENT_AMBULATORY_CARE_PROVIDER_SITE_OTHER): Payer: Self-pay | Admitting: Vascular Surgery

## 2021-04-05 ENCOUNTER — Ambulatory Visit (INDEPENDENT_AMBULATORY_CARE_PROVIDER_SITE_OTHER): Payer: Medicare Other | Admitting: Vascular Surgery

## 2021-04-05 VITALS — BP 184/75 | HR 73 | Resp 16 | Wt 145.4 lb

## 2021-04-05 DIAGNOSIS — I25118 Atherosclerotic heart disease of native coronary artery with other forms of angina pectoris: Secondary | ICD-10-CM

## 2021-04-05 DIAGNOSIS — S85002S Unspecified injury of popliteal artery, left leg, sequela: Secondary | ICD-10-CM | POA: Diagnosis not present

## 2021-04-05 DIAGNOSIS — I70212 Atherosclerosis of native arteries of extremities with intermittent claudication, left leg: Secondary | ICD-10-CM

## 2021-04-05 DIAGNOSIS — I872 Venous insufficiency (chronic) (peripheral): Secondary | ICD-10-CM

## 2021-04-05 DIAGNOSIS — E782 Mixed hyperlipidemia: Secondary | ICD-10-CM

## 2021-04-05 NOTE — Progress Notes (Signed)
MRN : [id number]  Melanie Browning is a 80 y.o. (01-20-1941) female who presents with chief complaint of No chief complaint on file. Marland Kitchen  History of Present Illness:  The patient returns to the office for followup and review of the noninvasive studies. There has been a significant deterioration in the lower extremity symptoms.  The patient notes interval shortening of their claudication distance and development of mild rest pain symptoms. No new ulcers or wounds have occurred since the last visit.  There have been no significant changes to the patient's overall health care.  The patient denies amaurosis fugax or recent TIA symptoms. There are no recent neurological changes noted. The patient denies history of DVT, PE or superficial thrombophlebitis. The patient denies recent episodes of angina or shortness of breath.   Duplex US of the lower extremity arterial system shows a transition to monophasic in the SFA    ABI Rt=1.05 and Lt=0.86  (previous ABI's Rt=1.21 and Lt=1.15)  No outpatient medications have been marked as taking for the 04/05/21 encounter (Appointment) with Gilda Crease, Latina Craver, MD.    Past Medical History:  Diagnosis Date   Arthritis    Colitis    D[place]    [place]   HPV (human papilloma virus) infection    Hyperlipidemia    Ischemia of left lower extremity    Osteoporosis    Polymyalgia rheumatica (HCC)    Thyroid disease    Vaginal dysplasia     Past Surgical History:  Procedure Laterality Date   ABDOMINAL HYSTERECTOMY     complete   COLONOSCOPY WITH PROPOFOL N/A 10/04/2020   Procedure: COLONOSCOPY WITH PROPOFOL;  Surgeon: Toledo, Boykin Nearing, MD;  Location: [facility] ENDOSCOPY;  Service: Gastroenterology;  Laterality: N/A;   colposcobpy     COLPOSCOPY     CORONARY STENT INTERVENTION N/A 11/15/2020   Procedure: CORONARY STENT INTERVENTION;  Surgeon: Alwyn Pea, MD;  Location: [facility] INVASIVE CV LAB;  Service: Cardiovascular;   Laterality: N/A;   knee athroscopy Left    LAPAROSCOPIC HYSTERECTOMY     LEFT HEART CATH AND CORONARY ANGIOGRAPHY N/A 11/15/2020   Procedure: LEFT HEART CATH AND CORONARY ANGIOGRAPHY;  Surgeon: Alwyn Pea, MD;  Location: [facility] INVASIVE CV LAB;  Service: Cardiovascular;  Laterality: N/A;   LOWER EXTREMITY ANGIOGRAPHY Left 09/05/2020   Procedure: LOWER EXTREMITY ANGIOGRAPHY;  Surgeon: Renford Dills, MD;  Location: [facility] INVASIVE CV LAB;  Service: Cardiovascular;  Laterality: Left;    Social History Social History   Tobacco Use   Smoking status: Never   Smokeless tobacco: Never  Vaping Use   Vaping Use: Never used  Substance Use Topics   Alcohol use: Not Currently    Alcohol/week: 3.0 standard drinks    Types: 3 Glasses of wine per week    Comment: 3 glasses of wine per week    Drug use: Never    Family History Family History  Problem Relation Age of Onset   Cancer Mother    Pancreatic cancer Mother    Heart disease Father     Allergies  Allergen Reactions   Lactose Other (See Comments)    Gi upset and diarrhea      REVIEW OF SYSTEMS (Negative unless checked)  Constitutional: [] Weight loss  [] Fever  [] Chills Cardiac: [] Chest pain   [] Chest pressure   [] Palpitations   [] Shortness of breath when laying flat   [] Shortness of breath with exertion. Vascular:  [x] Pain in legs with walking   []   Pain in legs at rest  [] History of DVT   [] Phlebitis   [] Swelling in legs   [] Varicose veins   [] Non-healing ulcers Pulmonary:   [] Uses home oxygen   [] Productive cough   [] Hemoptysis   [] Wheeze  [] COPD   [] Asthma Neurologic:  [] Dizziness   [] Seizures   [] History of stroke   [] History of TIA  [] Aphasia   [] Vissual changes   [] Weakness or numbness in arm   [] Weakness or numbness in leg Musculoskeletal:   [] Joint swelling   [] Joint pain   [] Low back pain Hematologic:  [] Easy bruising  [] Easy bleeding   [] Hypercoagulable state   [] Anemic Gastrointestinal:  [] Diarrhea   [] Vomiting   [] Gastroesophageal reflux/heartburn   [] Difficulty swallowing. Genitourinary:  [] Chronic kidney disease   [] Difficult urination  [] Frequent urination   [] Blood in urine Skin:  [] Rashes   [] Ulcers  Psychological:  [] History of anxiety   []  History of major depression.  Physical Examination  There were no vitals filed for this visit. There is no height or weight on file to calculate BMI. Gen: WD/WN, NAD Head: [place]/AT, No temporalis wasting.  Ear/Nose/Throat: Hearing grossly intact, nares w/o erythema or drainage Eyes: PER, EOMI, sclera nonicteric.  Neck: Supple, no large masses.   Pulmonary:  Good air movement, no audible wheezing bilaterally, no use of accessory muscles.  Cardiac: RRR, no JVD Vascular:  Vessel Right Left  Radial Palpable Palpable  PT Palpable Not Palpable  DP Palpable Not Palpable  Gastrointestinal: Non-distended. No guarding/no peritoneal signs.  Musculoskeletal: M/S 5/5 throughout.  No deformity or atrophy.  Neurologic: CN 2-12 intact. Symmetrical.  Speech is fluent. Motor exam as listed above. Psychiatric: Judgment intact, Mood & affect appropriate for pt's clinical situation. Dermatologic: No rashes or ulcers noted.  No changes consistent with cellulitis.   CBC No results found for: WBC, HGB, HCT, MCV, PLT  BMET    Component Value Date/Time   BUN 18 09/05/2020 0739   CREATININE 0.61 09/05/2020 0739   GFRNONAA >60 09/05/2020 0739   CrCl cannot be calculated (Patient's most recent lab result is older than the maximum 21 days allowed.).  COAG No results found for: INR, PROTIME  Radiology No results found.   Assessment/Plan 1. 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  Given the severity of the patient's lower extremity symptoms the patient  should undergo angiography and intervention of the left lower extremity.  Risk and benefits were reviewed the patient.  Indications for the procedure were reviewed.  All questions were answered, the patient agrees to proceed.   The patient should continue walking and begin a more formal exercise program.  The patient should continue antiplatelet therapy and aggressive treatment of the lipid abnormalities  The patient will follow up with me after the angiogram.    2. Popliteal artery injury, left, sequela See #1  3. Chronic venous insufficiency No surgery or intervention at this point in time.    I have had a long discussion with the patient regarding venous insufficiency and why it  causes symptoms. I have discussed with the patient the chronic skin changes that accompany venous insufficiency and the long term sequela such as infection and ulceration.  Patient will begin wearing graduated compression stockings class 1 (20-30 mmHg) or compression wraps on a daily basis a prescription was given. The patient will  put the stockings on first thing in the morning and removing them in the evening. The patient is instructed specifically not to sleep in the stockings.    In addition, behavioral modification including several periods of elevation of the lower extremities during the day will be continued. I have demonstrated that proper elevation is a position with the ankles at heart level.  The patient is instructed to begin routine exercise, especially walking on a daily basis  4. Coronary artery disease of native artery of native heart with stable angina pectoris (HCC) Continue cardiac and antihypertensive medications as already ordered and reviewed, no changes at this time.  Continue statin as ordered and reviewed, no changes at this time  Nitrates PRN for chest pain   5. Mixed hyperlipidemia Continue statin as ordered and reviewed, no changes at this time     Levora Dredge,  MD  04/05/2021 1:10 PM

## 2021-04-05 NOTE — H&P (View-Only) (Signed)
MRN : [id number]  Shalinda Kuske is a 80 y.o. (01-20-1941) female who presents with chief complaint of No chief complaint on file. Marland Kitchen  History of Present Illness:  The patient returns to the office for followup and review of the noninvasive studies. There has been a significant deterioration in the lower extremity symptoms.  The patient notes interval shortening of their claudication distance and development of mild rest pain symptoms. No new ulcers or wounds have occurred since the last visit.  There have been no significant changes to the patient's overall health care.  The patient denies amaurosis fugax or recent TIA symptoms. There are no recent neurological changes noted. The patient denies history of DVT, PE or superficial thrombophlebitis. The patient denies recent episodes of angina or shortness of breath.   Duplex US of the lower extremity arterial system shows a transition to monophasic in the SFA    ABI Rt=1.05 and Lt=0.86  (previous ABI's Rt=1.21 and Lt=1.15)  No outpatient medications have been marked as taking for the 04/05/21 encounter (Appointment) with Gilda Crease, Latina Craver, MD.    Past Medical History:  Diagnosis Date   Arthritis    Colitis    [place]   HPV (human papilloma virus) infection    Hyperlipidemia    Ischemia of left lower extremity    Osteoporosis    Polymyalgia rheumatica (HCC)    Thyroid disease    Vaginal dysplasia     Past Surgical History:  Procedure Laterality Date   ABDOMINAL HYSTERECTOMY     complete   COLONOSCOPY WITH PROPOFOL N/A 10/04/2020   Procedure: COLONOSCOPY WITH PROPOFOL;  Surgeon: Toledo, Boykin Nearing, MD;  Location: [facility];  Service: Gastroenterology;  Laterality: N/A;   colposcobpy     COLPOSCOPY     CORONARY STENT INTERVENTION N/A 11/15/2020   Procedure: CORONARY STENT INTERVENTION;  Surgeon: Alwyn Pea, MD;  Location: [facility];  Service: Cardiovascular;   Laterality: N/A;   knee athroscopy Left    LAPAROSCOPIC HYSTERECTOMY     LEFT HEART CATH AND CORONARY ANGIOGRAPHY N/A 11/15/2020   Procedure: LEFT HEART CATH AND CORONARY ANGIOGRAPHY;  Surgeon: Alwyn Pea, MD;  Location: [facility];  Service: Cardiovascular;  Laterality: N/A;   LOWER EXTREMITY ANGIOGRAPHY Left 09/05/2020   Procedure: LOWER EXTREMITY ANGIOGRAPHY;  Surgeon: Renford Dills, MD;  Location: [facility];  Service: Cardiovascular;  Laterality: Left;    Social History Social History   Tobacco Use   Smoking status: Never   Smokeless tobacco: Never  Vaping Use   Vaping Use: Never used  Substance Use Topics   Alcohol use: Not Currently    Alcohol/week: 3.0 standard drinks    Types: 3 Glasses of wine per week    Comment: 3 glasses of wine per week    Drug use: Never    Family History Family History  Problem Relation Age of Onset   Cancer Mother    Pancreatic cancer Mother    Heart disease Father     Allergies  Allergen Reactions   Lactose Other (See Comments)    Gi upset and diarrhea      REVIEW OF SYSTEMS (Negative unless checked)  Constitutional: [] Weight loss  [] Fever  [] Chills Cardiac: [] Chest pain   [] Chest pressure   [] Palpitations   [] Shortness of breath when laying flat   [] Shortness of breath with exertion. Vascular:  [x] Pain in legs with walking   []   Pain in legs at rest  [] History of DVT   [] Phlebitis   [] Swelling in legs   [] Varicose veins   [] Non-healing ulcers Pulmonary:   [] Uses home oxygen   [] Productive cough   [] Hemoptysis   [] Wheeze  [] COPD   [] Asthma Neurologic:  [] Dizziness   [] Seizures   [] History of stroke   [] History of TIA  [] Aphasia   [] Vissual changes   [] Weakness or numbness in arm   [] Weakness or numbness in leg Musculoskeletal:   [] Joint swelling   [] Joint pain   [] Low back pain Hematologic:  [] Easy bruising  [] Easy bleeding   [] Hypercoagulable state   [] Anemic Gastrointestinal:  [] Diarrhea   [] Vomiting   [] Gastroesophageal reflux/heartburn   [] Difficulty swallowing. Genitourinary:  [] Chronic kidney disease   [] Difficult urination  [] Frequent urination   [] Blood in urine Skin:  [] Rashes   [] Ulcers  Psychological:  [] History of anxiety   []  History of major depression.  Physical Examination  There were no vitals filed for this visit. There is no height or weight on file to calculate BMI. Gen: WD/WN, NAD Head: [place]/AT, No temporalis wasting.  Ear/Nose/Throat: Hearing grossly intact, nares w/o erythema or drainage Eyes: PER, EOMI, sclera nonicteric.  Neck: Supple, no large masses.   Pulmonary:  Good air movement, no audible wheezing bilaterally, no use of accessory muscles.  Cardiac: RRR, no JVD Vascular:  Vessel Right Left  Radial Palpable Palpable  PT Palpable Not Palpable  DP Palpable Not Palpable  Gastrointestinal: Non-distended. No guarding/no peritoneal signs.  Musculoskeletal: M/S 5/5 throughout.  No deformity or atrophy.  Neurologic: CN 2-12 intact. Symmetrical.  Speech is fluent. Motor exam as listed above. Psychiatric: Judgment intact, Mood & affect appropriate for pt's clinical situation. Dermatologic: No rashes or ulcers noted.  No changes consistent with cellulitis.   CBC No results found for: WBC, HGB, HCT, MCV, PLT  BMET    Component Value Date/Time   BUN 18 09/05/2020 0739   CREATININE 0.61 09/05/2020 0739   GFRNONAA >60 09/05/2020 0739   CrCl cannot be calculated (Patient's most recent lab result is older than the maximum 21 days allowed.).  COAG No results found for: INR, PROTIME  Radiology No results found.   Assessment/Plan 1. 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  Given the severity of the patient's lower extremity symptoms the patient  should undergo angiography and intervention of the left lower extremity.  Risk and benefits were reviewed the patient.  Indications for the procedure were reviewed.  All questions were answered, the patient agrees to proceed.   The patient should continue walking and begin a more formal exercise program.  The patient should continue antiplatelet therapy and aggressive treatment of the lipid abnormalities  The patient will follow up with me after the angiogram.    2. Popliteal artery injury, left, sequela See #1  3. Chronic venous insufficiency No surgery or intervention at this point in time.    I have had a long discussion with the patient regarding venous insufficiency and why it  causes symptoms. I have discussed with the patient the chronic skin changes that accompany venous insufficiency and the long term sequela such as infection and ulceration.  Patient will begin wearing graduated compression stockings class 1 (20-30 mmHg) or compression wraps on a daily basis a prescription was given. The patient will  put the stockings on first thing in the morning and removing them in the evening. The patient is instructed specifically not to sleep in the stockings.    In addition, behavioral modification including several periods of elevation of the lower extremities during the day will be continued. I have demonstrated that proper elevation is a position with the ankles at heart level.  The patient is instructed to begin routine exercise, especially walking on a daily basis  4. Coronary artery disease of native artery of native heart with stable angina pectoris (HCC) Continue cardiac and antihypertensive medications as already ordered and reviewed, no changes at this time.  Continue statin as ordered and reviewed, no changes at this time  Nitrates PRN for chest pain   5. Mixed hyperlipidemia Continue statin as ordered and reviewed, no changes at this time     Levora Dredge,  MD  04/05/2021 1:10 PM

## 2021-04-07 ENCOUNTER — Encounter (INDEPENDENT_AMBULATORY_CARE_PROVIDER_SITE_OTHER): Payer: Self-pay | Admitting: Vascular Surgery

## 2021-04-09 ENCOUNTER — Telehealth (INDEPENDENT_AMBULATORY_CARE_PROVIDER_SITE_OTHER): Payer: Self-pay

## 2021-04-09 ENCOUNTER — Other Ambulatory Visit (INDEPENDENT_AMBULATORY_CARE_PROVIDER_SITE_OTHER): Payer: Self-pay | Admitting: Nurse Practitioner

## 2021-04-09 NOTE — Telephone Encounter (Signed)
Spoke with the patient and she is scheduled with Dr. Gilda Crease for a left let angio with intervention on 04/10/21 with a 1:00 pm arrival time to the MM. Pre-procedure instructions were discussed.

## 2021-04-10 ENCOUNTER — Observation Stay
Admission: RE | Admit: 2021-04-10 | Discharge: 2021-04-11 | Disposition: A | Discharge status: Home / Self Care | Payer: Medicare Other | Attending: Vascular Surgery | Admitting: Vascular Surgery

## 2021-04-10 ENCOUNTER — Encounter (INDEPENDENT_AMBULATORY_CARE_PROVIDER_SITE_OTHER): Payer: Self-pay

## 2021-04-10 ENCOUNTER — Other Ambulatory Visit: Payer: Self-pay

## 2021-04-10 ENCOUNTER — Encounter
Admission: RE | Disposition: A | Discharge status: Home / Self Care | Payer: Self-pay | Source: Home / Self Care | Attending: Vascular Surgery

## 2021-04-10 ENCOUNTER — Encounter: Payer: Self-pay | Admitting: Vascular Surgery

## 2021-04-10 DIAGNOSIS — I25118 Atherosclerotic heart disease of native coronary artery with other forms of angina pectoris: Secondary | ICD-10-CM | POA: Insufficient documentation

## 2021-04-10 DIAGNOSIS — I70219 Atherosclerosis of native arteries of extremities with intermittent claudication, unspecified extremity: Secondary | ICD-10-CM

## 2021-04-10 DIAGNOSIS — I70212 Atherosclerosis of native arteries of extremities with intermittent claudication, left leg: Principal | ICD-10-CM | POA: Insufficient documentation

## 2021-04-10 DIAGNOSIS — I872 Venous insufficiency (chronic) (peripheral): Secondary | ICD-10-CM | POA: Insufficient documentation

## 2021-04-10 DIAGNOSIS — Y828 Other medical devices associated with adverse incidents: Secondary | ICD-10-CM | POA: Insufficient documentation

## 2021-04-10 DIAGNOSIS — Z79890 Hormone replacement therapy (postmenopausal): Secondary | ICD-10-CM | POA: Diagnosis not present

## 2021-04-10 DIAGNOSIS — I739 Peripheral vascular disease, unspecified: Secondary | ICD-10-CM | POA: Diagnosis present

## 2021-04-10 DIAGNOSIS — T82856A Stenosis of peripheral vascular stent, initial encounter: Secondary | ICD-10-CM | POA: Insufficient documentation

## 2021-04-10 DIAGNOSIS — Z7982 Long term (current) use of aspirin: Secondary | ICD-10-CM | POA: Diagnosis not present

## 2021-04-10 DIAGNOSIS — Y712 Prosthetic and other implants, materials and accessory cardiovascular devices associated with adverse incidents: Secondary | ICD-10-CM

## 2021-04-10 DIAGNOSIS — Z79899 Other long term (current) drug therapy: Secondary | ICD-10-CM | POA: Diagnosis not present

## 2021-04-10 DIAGNOSIS — S85002S Unspecified injury of popliteal artery, left leg, sequela: Secondary | ICD-10-CM | POA: Insufficient documentation

## 2021-04-10 DIAGNOSIS — T82858A Stenosis of vascular prosthetic devices, implants and grafts, initial encounter: Secondary | ICD-10-CM

## 2021-04-10 HISTORY — PX: LOWER EXTREMITY ANGIOGRAPHY: CATH118251

## 2021-04-10 LAB — CREATININE, SERUM
Creatinine, Ser: 0.62 mg/dL (ref 0.44–1.00)
GFR, Estimated: >60 mL/min (ref >60)

## 2021-04-10 LAB — BUN: BUN: 14 mg/dL (ref 8–23)

## 2021-04-10 SURGERY — LOWER EXTREMITY ANGIOGRAPHY
Anesthesia: Moderate Sedation | Site: Leg Lower | Laterality: Left

## 2021-04-10 MED ORDER — LEVOTHYROXINE SODIUM 50 MCG PO TABS
75.0000 ug | ORAL_TABLET | Freq: Every day | ORAL | Status: DC
  Administered 2021-04-11: 75 ug via ORAL
  Filled 2021-04-10 (×2): qty 1

## 2021-04-10 MED ORDER — FAMOTIDINE 20 MG PO TABS: 40.0000 mg | ORAL_TABLET | Freq: Once | ORAL | Status: DC | PRN

## 2021-04-10 MED ORDER — MIDAZOLAM HCL 2 MG/ML PO SYRP: 8.0000 mg | ORAL_SOLUTION | Freq: Once | ORAL | Status: DC | PRN

## 2021-04-10 MED ORDER — DIPHENHYDRAMINE HCL 50 MG/ML IJ SOLN: 50.0000 mg | Freq: Once | INTRAMUSCULAR | Status: DC | PRN

## 2021-04-10 MED ORDER — ATORVASTATIN CALCIUM 20 MG PO TABS
40.0000 mg | ORAL_TABLET | Freq: Every day | ORAL | Status: DC
  Administered 2021-04-10: 40 mg via ORAL
  Filled 2021-04-10 (×2): qty 2

## 2021-04-10 MED ORDER — MIDAZOLAM HCL 5 MG/5ML IJ SOLN
INTRAMUSCULAR | Status: AC
  Filled 2021-04-10: qty 5

## 2021-04-10 MED ORDER — AMITRIPTYLINE HCL 10 MG PO TABS
10.0000 mg | ORAL_TABLET | Freq: Every day | ORAL | Status: DC
  Administered 2021-04-10: 10 mg via ORAL
  Filled 2021-04-10 (×2): qty 1

## 2021-04-10 MED ORDER — MIDAZOLAM HCL 2 MG/2ML IJ SOLN
INTRAMUSCULAR | Status: DC | PRN
  Administered 2021-04-10: 1 mg via INTRAVENOUS
  Administered 2021-04-10: 2 mg via INTRAVENOUS

## 2021-04-10 MED ORDER — ONDANSETRON HCL 4 MG/2ML IJ SOLN: 4.0000 mg | Freq: Four times a day (QID) | INTRAMUSCULAR | Status: DC | PRN

## 2021-04-10 MED ORDER — HYDRALAZINE HCL 20 MG/ML IJ SOLN: 5.0000 mg | INTRAMUSCULAR | Status: DC | PRN

## 2021-04-10 MED ORDER — LABETALOL HCL 5 MG/ML IV SOLN
10.0000 mg | INTRAVENOUS | Status: DC | PRN
  Administered 2021-04-10: 10 mg via INTRAVENOUS

## 2021-04-10 MED ORDER — FENTANYL CITRATE (PF) 100 MCG/2ML IJ SOLN
INTRAMUSCULAR | Status: DC | PRN
  Administered 2021-04-10: 50 ug via INTRAVENOUS
  Administered 2021-04-10: 25 ug via INTRAVENOUS

## 2021-04-10 MED ORDER — ACETAMINOPHEN 325 MG PO TABS: 650.0000 mg | ORAL_TABLET | ORAL | Status: DC | PRN

## 2021-04-10 MED ORDER — SODIUM CHLORIDE 0.9 % IV SOLN: INTRAVENOUS | Status: DC

## 2021-04-10 MED ORDER — LABETALOL HCL 5 MG/ML IV SOLN
INTRAVENOUS | Status: AC
  Filled 2021-04-10: qty 4

## 2021-04-10 MED ORDER — HEPARIN SODIUM (PORCINE) 1000 UNIT/ML IJ SOLN
INTRAMUSCULAR | Status: AC
  Filled 2021-04-10: qty 1

## 2021-04-10 MED ORDER — FENTANYL CITRATE (PF) 100 MCG/2ML IJ SOLN
INTRAMUSCULAR | Status: AC
  Filled 2021-04-10: qty 2

## 2021-04-10 MED ORDER — SODIUM CHLORIDE 0.9 % IV SOLN: 250.0000 mL | INTRAVENOUS | Status: DC | PRN

## 2021-04-10 MED ORDER — CEFAZOLIN SODIUM-DEXTROSE 2-4 GM/100ML-% IV SOLN
2.0000 g | Freq: Once | INTRAVENOUS | Status: AC
  Administered 2021-04-10: 2 g via INTRAVENOUS

## 2021-04-10 MED ORDER — SODIUM CHLORIDE 0.9% FLUSH: 3.0000 mL | Freq: Two times a day (BID) | INTRAVENOUS | Status: DC

## 2021-04-10 MED ORDER — HYDROCODONE-ACETAMINOPHEN 5-325 MG PO TABS
1.0000 | ORAL_TABLET | Freq: Four times a day (QID) | ORAL | Status: DC | PRN
Start: 2021-04-10 — End: 2021-04-11

## 2021-04-10 MED ORDER — MORPHINE SULFATE (PF) 4 MG/ML IV SOLN: 2.0000 mg | INTRAVENOUS | Status: DC | PRN

## 2021-04-10 MED ORDER — SODIUM CHLORIDE 0.9% FLUSH: 3.0000 mL | INTRAVENOUS | Status: DC | PRN

## 2021-04-10 MED ORDER — HEPARIN SODIUM (PORCINE) 1000 UNIT/ML IJ SOLN
INTRAMUSCULAR | Status: DC | PRN
  Administered 2021-04-10: 5000 [IU] via INTRAVENOUS

## 2021-04-10 MED ORDER — HYDROMORPHONE HCL 1 MG/ML IJ SOLN: 1.0000 mg | Freq: Once | INTRAMUSCULAR | Status: DC | PRN

## 2021-04-10 MED ORDER — METHYLPREDNISOLONE SODIUM SUCC 125 MG IJ SOLR: 125.0000 mg | Freq: Once | INTRAMUSCULAR | Status: DC | PRN

## 2021-04-10 MED ORDER — SODIUM CHLORIDE 0.9 % IV SOLN: INTRAVENOUS | Status: AC

## 2021-04-10 MED ORDER — OXYCODONE HCL 5 MG PO TABS: 5.0000 mg | ORAL_TABLET | ORAL | Status: DC | PRN

## 2021-04-10 SURGICAL SUPPLY — 21 items
BALLN LUTONIX 018 4X100X130 (BALLOONS) ×2
BALLN LUTONIX 018 5X40X130 (BALLOONS) ×2
BALLN LUTONIX 018 6X60X130 (BALLOONS) ×2
BALLOON LUTONIX 018 4X100X130 (BALLOONS) ×1 IMPLANT
BALLOON LUTONIX 018 5X40X130 (BALLOONS) ×1 IMPLANT
BALLOON LUTONIX 018 6X60X130 (BALLOONS) ×1 IMPLANT
CANNULA 5F STIFF (CANNULA) ×2 IMPLANT
CATH ROTAREX 135 6FR (CATHETERS) ×2 IMPLANT
CATH TEMPO 5F RIM 65CM (CATHETERS) ×2 IMPLANT
CATH VERT 5FR 125CM (CATHETERS) ×2 IMPLANT
COVER PROBE U/S 5X48 (MISCELLANEOUS) ×2 IMPLANT
DEVICE STARCLOSE SE CLOSURE (Vascular Products) ×2 IMPLANT
DEVICE TORQUE (MISCELLANEOUS) ×2 IMPLANT
GLIDEWIRE ADV .035X260CM (WIRE) ×4 IMPLANT
KIT ENCORE 26 ADVANTAGE (KITS) ×2 IMPLANT
PACK ANGIOGRAPHY (CUSTOM PROCEDURE TRAY) ×2 IMPLANT
SHEATH BRITE TIP 5FRX11 (SHEATH) ×2 IMPLANT
SHEATH RAABE 6FR (SHEATH) ×2 IMPLANT
STENT LIFESTENT 5F 6X60X135 (Permanent Stent) ×2 IMPLANT
WIRE G V18X300CM (WIRE) ×2 IMPLANT
WIRE GUIDERIGHT .035X150 (WIRE) ×2 IMPLANT

## 2021-04-10 NOTE — Interval H&P Note (Signed)
History and Physical Interval Note:  04/10/2021 3:23 PM  Melanie Browning  has presented today for surgery, with the diagnosis of LT leg angio w intervention  ASO w claudication.  The various methods of treatment have been discussed with the patient and family. After consideration of risks, benefits and other options for treatment, the patient has consented to  Procedure(s): LOWER EXTREMITY ANGIOGRAPHY (Left) as a surgical intervention.  The patient's history has been reviewed, patient examined, no change in status, stable for surgery.  I have reviewed the patient's chart and labs.  Questions were answered to the patient's satisfaction.     Levora Dredge

## 2021-04-10 NOTE — Op Note (Signed)
[facility]  Percutaneous Study/Intervention Procedural Note   Date of Surgery: 04/10/2021  Surgeon:  Katha Cabal, MD.  Pre-operative Diagnosis: Atherosclerotic occlusive disease left lower extremity with lifestyle limiting claudication; complication vascular device with recurrence of her stenosis in-stent.  Post-operative diagnosis:  Same  Procedure(s) Performed:             1.  Introduction catheter into left lower extremity 3rd order catheter placement              2.    Contrast injection left lower extremity for distal runoff             3.  Percutaneous transluminal angioplasty and stent placement left distal superficial femoral artery and popliteal             4.  Rota Rex thrombectomy distal SFA and popliteal artery.             5.  Star close closure right common femoral arteriotomy  Anesthesia: Conscious sedation was administered under my direct supervision by the interventional radiology RN. IV Versed plus fentanyl were utilized. Continuous ECG, pulse oximetry and blood pressure was monitored throughout the entire procedure.  Conscious sedation was for a total of 54 minutes.  Sheath: 6 [place] Rabie right common femoral retrograde  Contrast: 45 cc  Fluoroscopy Time: 7.3 minutes  Indications:  Bonnetta Allbee presents with increasing pain with ambulation.  She is status post multiple interventions within the right popliteal artery.  Noninvasive studies as well as physical examination is supported restenosis within the distal SFA popliteal distribution.  The risks and benefits of angiography with 3 intervention are reviewed all questions answered patient agrees to proceed.  Procedure:  Ranell Finelli is a 80 y.o. y.o. female who was identified and appropriate procedural time out was performed.  The patient was then placed supine on the table and prepped and draped in the usual sterile fashion.    Ultrasound was placed in the sterile sleeve and the  right groin was evaluated the right common femoral artery was echolucent and pulsatile indicating patency.  Image was recorded for the permanent record and under real-time visualization a microneedle was inserted into the common femoral artery microwire followed by a micro-sheath.  A J-wire was then advanced through the micro-sheath and a  5 [place] sheath was then inserted over a J-wire. J-wire was then advanced and a 5 French rim catheter was positioned at the level of the aortic bifurcation and a RAO view of the pelvis was obtained by hand-injection.  Subsequently a rim catheter with the advantage Glidewire was used to cross the aortic bifurcation the catheter wire were advanced down into the left distal external iliac artery. Oblique view of the femoral bifurcation was then obtained and subsequently the wire was reintroduced and the pigtail catheter negotiated into the SFA representing third order catheter placement. Distal runoff was then performed.  5000 units of heparin was then given and allowed to circulate and a 6 [place] Rabie sheath was advanced up and over the bifurcation and positioned in the femoral artery  KMP  catheter and advantage Glidewire were then negotiated down into the distal popliteal.  Intraluminal positioning was confirmed by hand injection through the catheter.  A V 18 wire was then introduced and the [place] Rex device prepped on the field was then advanced over the 018 wire and [place] Rex thrombectomy was performed through the lesion in the stent.  Follow-up image now demonstrated a significant  improvement in the lumen but there is still greater than 40% residual stenosis.  A 4 mm x 100 mm Lutonix drug-eluting balloon was used to angioplasty the distal superficial femoral and proximal to mid popliteal arteries mostly across the area that had previously been stented. Inflation was to 10 atmospheres for 1 minute.  Again greater than 40% residual stenosis was still identified and I elected  to place a stent for more adequate treatment.  A 6 mm by 60 mm life stent was then deployed across the area of recurrent stenosis extending the stent more proximally.  Subsequently, a 6 mm x 60 mm Lutonix drug-eluting balloon was utilized inflating to 10 atm for 2 full minutes.  To complete treatment a 5 mm x 40 mm Lutonix drug-eluting balloon was used to angioplasty the leading edge of the new stent.  Inflation was to 10 atm for 1 minute.  Follow-up imaging demonstrated less than 10% residual stenosis and distal runoff was then preserved.  After review of these images the sheath is pulled into the right external iliac oblique of the common femoral is obtained and a Star close device deployed. There no immediate complications.   Findings:  The abdominal aorta was not specifically imaged given her recent angiogram and the minimal disease noted at that time  The left common iliac as well as the external iliac arteries are widely patent.  The left common femoral is widely patent as is the profunda femoris.  The distal SFA does indeed have a significant in-stent stenosis that extends also into the popliteal. The distal popliteal demonstrates remains widely patent and the trifurcation is widely patent previously treated tibioperoneal trunk has less than 5% residual stenosis and remains intact.  Anterior tibial posterior tibial and peroneal are all present and are patent to the foot.  Following treatment with the [place] Rex catheter there is significant luminal gain through the in-stent restenosis  Following angioplasty and stent placement the distal SFA and popliteal are now widely patent with less than 10% residual stenosis.  Distal runoff is preserved.    Summary: Successful recanalization left lower extremity for limb salvage                        Disposition: Patient was taken to the recovery room in stable condition having tolerated the procedure well.  Rocklyn Mayberry, Dolores Lory 04/10/2021,5:14 PM

## 2021-04-10 NOTE — OR Nursing (Signed)
Paged Dr. Wyn Quaker d/t pt still having ooze despite stable BP, bladder being emptied and holing pressure for 1+ hr.  Orders for PAD and admission to hospital  PT is stable

## 2021-04-11 ENCOUNTER — Telehealth: Payer: Self-pay

## 2021-04-11 ENCOUNTER — Encounter: Payer: Self-pay | Admitting: Vascular Surgery

## 2021-04-11 DIAGNOSIS — I70219 Atherosclerosis of native arteries of extremities with intermittent claudication, unspecified extremity: Secondary | ICD-10-CM | POA: Diagnosis not present

## 2021-04-11 DIAGNOSIS — I70212 Atherosclerosis of native arteries of extremities with intermittent claudication, left leg: Secondary | ICD-10-CM | POA: Diagnosis not present

## 2021-04-11 LAB — CBC
HCT: 36.6 % (ref 36.0–46.0)
Hemoglobin: 12.2 g/dL (ref 12.0–15.0)
MCH: 32.6 pg (ref 26.0–34.0)
MCHC: 33.3 g/dL (ref 30.0–36.0)
MCV: 97.9 fL (ref 80.0–100.0)
Platelets: 167 10*3/uL (ref 150–400)
RBC: 3.74 MIL/uL — ABNORMAL LOW (ref 3.87–5.11)
RDW: 12.9 % (ref 11.5–15.5)
WBC: 6.5 10*3/uL (ref 4.0–10.5)
nRBC: 0 % (ref 0.0–0.2)

## 2021-04-11 NOTE — Progress Notes (Signed)
Patient vitals WDL at time of discharge. All discharge education reviewed and all questions answered at this time. Follow up appts in place for patient. Husband to pick patient up at [facility]. Patient has very pleasant disposition and has been enjoyable to provide care for.

## 2021-04-11 NOTE — Progress Notes (Signed)
Started deflating PAD at 1050 per order. 30cc remain at this time. Site remains at level 0 without any new signs of bleeding at this time. Will continue to closely monitor.

## 2021-04-11 NOTE — Discharge Instructions (Signed)
You may shower as of tomorrow. Please keep your groin clean and dry. Gently clean with soap and water. Gently pat dry.  Please don't engage in strenuous activity or lifting greater then ten pounds until your are cleared at your first post-procedure follow up.

## 2021-04-11 NOTE — Telephone Encounter (Signed)
Second inquiry sent regarding requested pathology consult on pap smear. Pap smear was received and signed for by [facility] on 03/16/21. No accession noted.

## 2021-04-11 NOTE — Discharge Summary (Signed)
[facility]    Discharge Summary  Patient ID:  Melanie Browning MRN: [id number] DOB/AGE: 12-06-1940 80 y.o.  Admit date: 04/10/2021 Discharge date: 04/11/2021 Date of Surgery: 04/10/2021 Surgeon: Surgeon(s): Schnier, Latina Craver, MD  Admission Diagnosis: PAD (peripheral artery disease) (HCC) [I73.9]  Discharge Diagnoses:  PAD (peripheral artery disease) (HCC) [I73.9]  Secondary Diagnoses: Past Medical History:  Diagnosis Date   Arthritis    Colitis    [place]   HPV (human papilloma virus) infection    Hyperlipidemia    Ischemia of left lower extremity    Osteoporosis    Polymyalgia rheumatica (HCC)    Thyroid disease    Vaginal dysplasia    Procedure(s): 04/10/21:             1.  Introduction catheter into left lower extremity 3rd order catheter placement             2.    Contrast injection left lower extremity for distal runoff             3.  Percutaneous transluminal angioplasty and stent placement left distal superficial femoral artery and popliteal             4.  Rota Rex thrombectomy distal SFA and popliteal artery.             5.  Star close closure right common femoral arteriotomy  Discharged Condition: Good  HPI / Hospital Course:  Melanie Browning presents with increasing pain with ambulation.  She is status post multiple interventions within the right popliteal artery.  Noninvasive studies as well as physical examination is supported restenosis within the distal SFA popliteal distribution.  The risks and benefits of angiography with 3 intervention are reviewed all questions answered patient agrees to proceed. On 04/10/21, the patient underwent a left lower extremity angiogram with intervention.  Due to to some post procedure bleeding to the right groin access site the patient was placed in observation overnight.  Postprocedure day #1, the patient's PAD was removed.  The area was clean dry and intact.  No  swelling or active bleeding noted.  The patient's diet was also advanced and her pain was controlled with the use of p.o. pain medication.  Patient was ambulating at baseline.  At time of discharge, the patient was afebrile with stable vital signs and no active bleeding.  Physical Exam:  Alert and oriented x3, no acute distress Cardiovascular: Regular rate and rhythm Pulmonary: Clear to auscultation bilaterally Abdomen: Soft, nontender, nondistended, positive bowel sounds Right groin access site: PAD removed.  Old clotted blood wiped away.  Access site is clean dry and intact with no active bleeding noted.  No swelling or ecchymosis noted. Left lower extremity: Thigh soft.  Calf soft.  Extremities warm distally toes.  Hard to palpate pedal pulses however there is good capillary refill and the foot is warm.  Motor/sensory is intact.  Labs: As below  Complications: None  Consults: None  Significant Diagnostic Studies: CBC Lab Results  Component Value Date   WBC 6.5 04/11/2021   HGB 12.2 04/11/2021   HCT 36.6 04/11/2021   MCV 97.9 04/11/2021   PLT 167 04/11/2021   BMET    Component Value Date/Time   BUN 14 04/10/2021 1331   CREATININE 0.62 04/10/2021 1331   GFRNONAA >60 04/10/2021 1331   COAG No results found for: INR, PROTIME  Disposition:  Discharge to :Home Discharge Instructions  Call MD for:  redness, tenderness, or signs of infection (pain, swelling, bleeding, redness, odor or green/yellow discharge around incision site)   Complete by: As directed    Call MD for:  severe or increased pain, loss or decreased feeling  in affected limb(s)   Complete by: As directed    Call MD for:  temperature >100.5   Complete by: As directed    Discharge instructions   Complete by: As directed    Okay to shower after 36 hours.    Please remove the right groin bandage before showering and replace with a Band-Aid daily as needed   Driving Restrictions   Complete by: As directed     No driving for 36 hours   Lifting restrictions   Complete by: As directed    No lifting for 1 week   No dressing needed   Complete by: As directed    Replace only if drainage present   Resume previous diet   Complete by: As directed       Allergies as of 04/11/2021       Reactions   Lactose Other (See Comments)   Gi upset and diarrhea        Medication List     TAKE these medications    amitriptyline 10 MG tablet Commonly known as: ELAVIL Take 1 tablet (10 mg total) by mouth at bedtime.   aspirin EC 81 MG tablet Take 81 mg by mouth every evening. Swallow whole.   atorvastatin 40 MG tablet Commonly known as: LIPITOR Take 40 mg by mouth every evening.   diphenoxylate-atropine 2.5-0.025 MG tablet Commonly known as: LOMOTIL Take 1 tablet by mouth daily as needed for diarrhea or loose stools.   levothyroxine 75 MCG tablet Commonly known as: SYNTHROID Take 75 mcg by mouth daily before breakfast.       ASK your doctor about these medications    clopidogrel 75 MG tablet Commonly known as: PLAVIX TAKE 1 TABLET BY MOUTH DAILY   estradiol 0.1 MG/GM vaginal cream Commonly known as: ESTRACE Insert 1/4 applicator full into vagina at night twice a week       Verbal and written Discharge instructions given to the patient. Wound care per Discharge AVS  Follow-up Information     Schnier, Latina Craver, MD Follow up in 2 week(s).   Specialties: Vascular Surgery, Cardiology, Radiology, Vascular Surgery Why: With an ABI  Double book if need schedule before her trip Contact information: [address] [place] [address] [phone]                Signed: Tonette Lederer, PA-C  04/11/2021, 2:41 PM

## 2021-04-12 ENCOUNTER — Encounter (INDEPENDENT_AMBULATORY_CARE_PROVIDER_SITE_OTHER): Payer: Self-pay

## 2021-04-13 ENCOUNTER — Telehealth (INDEPENDENT_AMBULATORY_CARE_PROVIDER_SITE_OTHER): Payer: Self-pay | Admitting: Vascular Surgery

## 2021-04-13 ENCOUNTER — Other Ambulatory Visit (INDEPENDENT_AMBULATORY_CARE_PROVIDER_SITE_OTHER): Payer: Self-pay | Admitting: Vascular Surgery

## 2021-04-13 ENCOUNTER — Other Ambulatory Visit (INDEPENDENT_AMBULATORY_CARE_PROVIDER_SITE_OTHER): Payer: Self-pay | Admitting: Nurse Practitioner

## 2021-04-13 ENCOUNTER — Encounter (INDEPENDENT_AMBULATORY_CARE_PROVIDER_SITE_OTHER): Payer: Self-pay

## 2021-04-13 MED ORDER — AMOXICILLIN-POT CLAVULANATE 875-125 MG PO TABS: 1.0000 | ORAL_TABLET | Freq: Two times a day (BID) | ORAL | 0 refills | Status: DC

## 2021-04-13 NOTE — Telephone Encounter (Signed)
[facility] Aug[facility]in to [facility] and she can keep the Monday appt

## 2021-04-13 NOTE — Telephone Encounter (Signed)
Called to make patient aware that [facility] will be sending antibiotics to her pharmacy and that G[facility] will see her on Monday 04/16/21.

## 2021-04-16 ENCOUNTER — Encounter (INDEPENDENT_AMBULATORY_CARE_PROVIDER_SITE_OTHER): Payer: Self-pay | Admitting: Vascular Surgery

## 2021-04-16 ENCOUNTER — Other Ambulatory Visit: Payer: Self-pay

## 2021-04-16 ENCOUNTER — Ambulatory Visit (INDEPENDENT_AMBULATORY_CARE_PROVIDER_SITE_OTHER): Payer: Medicare Other | Admitting: Vascular Surgery

## 2021-04-16 VITALS — BP 167/76 | HR 85 | Resp 16 | Ht 68.0 in | Wt 144.0 lb

## 2021-04-16 DIAGNOSIS — I70212 Atherosclerosis of native arteries of extremities with intermittent claudication, left leg: Secondary | ICD-10-CM

## 2021-04-16 DIAGNOSIS — E782 Mixed hyperlipidemia: Secondary | ICD-10-CM | POA: Diagnosis not present

## 2021-04-16 DIAGNOSIS — I25118 Atherosclerotic heart disease of native coronary artery with other forms of angina pectoris: Secondary | ICD-10-CM

## 2021-04-16 NOTE — Progress Notes (Signed)
REVIEW OF SYSTEMS (Negative unless checked)  Constitutional: [] Weight loss  [] Fever  [] Chills Cardiac: [] Chest pain   [] Chest pressure   [] Palpitations   [] Shortness of breath when laying flat   [] Shortness of breath with exertion. Vascular:  [x] Pain in legs with walking   [] Pain in legs at rest  [] History of DVT   [] Phlebitis   [] Swelling in legs   [] Varicose veins   [] Non-healing ulcers Pulmonary:   [] Uses home oxygen   [] Productive cough   [] Hemoptysis   [] Wheeze  [] COPD   [] Asthma Neurologic:  [] Dizziness   [] Seizures   [] History of stroke   [] History of TIA   [] Aphasia   [] Vissual changes   [] Weakness or numbness in arm   [] Weakness or numbness in leg Musculoskeletal:   [] Joint swelling   [] Joint pain   [] Low back pain Hematologic:  [] Easy bruising  [] Easy bleeding   [] Hypercoagulable state   [] Anemic Gastrointestinal:  [] Diarrhea   [] Vomiting  [] Gastroesophageal reflux/heartburn   [] Difficulty swallowing. Genitourinary:  [] Chronic kidney disease   [] Difficult urination  [] Frequent urination   [] Blood in urine Skin:  [] Rashes   [] Ulcers  Psychological:  [] History of anxiety   []  History of major depression.  Physical Examination  There were no vitals filed for this visit. There is no height or weight on file to calculate BMI. Gen: WD/WN, NAD Head: [place]/AT, No temporalis wasting.  Ear/Nose/Throat: Hearing grossly intact, nares w/o erythema or drainage Eyes: PER, EOMI, sclera nonicteric.  Neck: Supple, no large masses.   Pulmonary:  Good air movement, no audible wheezing bilaterally, no use of accessory muscles.  Cardiac: RRR, no JVD Vascular:  small blood blister from the tape is intact and uninfected   Vessel Right Left  Radial Palpable Palpable  PT Palpable Palpable  DP Palpable Palpable  Gastrointestinal: Non-distended. No guarding/no peritoneal signs.  Musculoskeletal: M/S 5/5 throughout.  No deformity or atrophy.  Neurologic: CN 2-12 intact. Symmetrical.  Speech is fluent. Motor exam as listed above. Psychiatric: Judgment intact, Mood & affect appropriate for pt's clinical situation. Dermatologic: No rashes or ulcers noted.  No changes consistent with cellulitis. Lymph : No lichenification or skin changes of chronic lymphedema.  CBC Lab Results  Component Value Date   WBC 6.5 04/11/2021   HGB 12.2 04/11/2021   HCT 36.6 04/11/2021   MCV 97.9 04/11/2021   PLT 167 04/11/2021    BMET    Component Value Date/Time   BUN 14 04/10/2021 1331   CREATININE 0.62 04/10/2021 1331   GFRNONAA >60 04/10/2021 1331   Estimated Creatinine  Clearance: 58.5 mL/min (by C-G formula based on SCr of 0.62 mg/dL).  COAG No results found for: INR, PROTIME  Radiology PERIPHERAL VASCULAR CATHETERIZATION  Result Date: 04/10/2021 See surgical note for result.  VAS US ABI WITH/WO TBI  Result Date: 04/09/2021  LOWER EXTREMITY DOPPLER STUDY Patient Name:  Melanie Browning  Date of Exam:   04/05/2021 Medical Rec #: [id number]      Accession #:    [id number] Date of Birth: 04/23/1941       Patient Gender: F Patient Age:   [age]Y Exam Location:  [facility] Procedure:      VAS US ABI WITH/WO TBI Referring Phys: [id number] Latina CraverGREGORY G Kaida Games --------------------------------------------------------------------------------  Indications: Claudication, and peripheral artery disease.  Vascular Interventions: 10/13/2019: Rt Lower Extremity Arteriogram via Rt Common  REVIEW OF SYSTEMS (Negative unless checked)  Constitutional: [] Weight loss  [] Fever  [] Chills Cardiac: [] Chest pain   [] Chest pressure   [] Palpitations   [] Shortness of breath when laying flat   [] Shortness of breath with exertion. Vascular:  [x] Pain in legs with walking   [] Pain in legs at rest  [] History of DVT   [] Phlebitis   [] Swelling in legs   [] Varicose veins   [] Non-healing ulcers Pulmonary:   [] Uses home oxygen   [] Productive cough   [] Hemoptysis   [] Wheeze  [] COPD   [] Asthma Neurologic:  [] Dizziness   [] Seizures   [] History of stroke   [] History of TIA   [] Aphasia   [] Vissual changes   [] Weakness or numbness in arm   [] Weakness or numbness in leg Musculoskeletal:   [] Joint swelling   [] Joint pain   [] Low back pain Hematologic:  [] Easy bruising  [] Easy bleeding   [] Hypercoagulable state   [] Anemic Gastrointestinal:  [] Diarrhea   [] Vomiting  [] Gastroesophageal reflux/heartburn   [] Difficulty swallowing. Genitourinary:  [] Chronic kidney disease   [] Difficult urination  [] Frequent urination   [] Blood in urine Skin:  [] Rashes   [] Ulcers  Psychological:  [] History of anxiety   []  History of major depression.  Physical Examination  There were no vitals filed for this visit. There is no height or weight on file to calculate BMI. Gen: WD/WN, NAD Head: [place]/AT, No temporalis wasting.  Ear/Nose/Throat: Hearing grossly intact, nares w/o erythema or drainage Eyes: PER, EOMI, sclera nonicteric.  Neck: Supple, no large masses.   Pulmonary:  Good air movement, no audible wheezing bilaterally, no use of accessory muscles.  Cardiac: RRR, no JVD Vascular:  small blood blister from the tape is intact and uninfected   Vessel Right Left  Radial Palpable Palpable  PT Palpable Palpable  DP Palpable Palpable  Gastrointestinal: Non-distended. No guarding/no peritoneal signs.  Musculoskeletal: M/S 5/5 throughout.  No deformity or atrophy.  Neurologic: CN 2-12 intact. Symmetrical.  Speech is fluent. Motor exam as listed above. Psychiatric: Judgment intact, Mood & affect appropriate for pt's clinical situation. Dermatologic: No rashes or ulcers noted.  No changes consistent with cellulitis. Lymph : No lichenification or skin changes of chronic lymphedema.  CBC Lab Results  Component Value Date   WBC 6.5 04/11/2021   HGB 12.2 04/11/2021   HCT 36.6 04/11/2021   MCV 97.9 04/11/2021   PLT 167 04/11/2021    BMET    Component Value Date/Time   BUN 14 04/10/2021 1331   CREATININE 0.62 04/10/2021 1331   GFRNONAA >60 04/10/2021 1331   Estimated Creatinine  Clearance: 58.5 mL/min (by C-G formula based on SCr of 0.62 mg/dL).  COAG No results found for: INR, PROTIME  Radiology PERIPHERAL VASCULAR CATHETERIZATION  Result Date: 04/10/2021 See surgical note for result.  VAS US ABI WITH/WO TBI  Result Date: 04/09/2021  LOWER EXTREMITY DOPPLER STUDY Patient Name:  Melanie Browning  Date of Exam:   04/05/2021 Medical Rec #: [id number]      Accession #:    [id number] Date of Birth: 04/23/1941       Patient Gender: F Patient Age:   [age]Y Exam Location:  [facility] Procedure:      VAS US ABI WITH/WO TBI Referring Phys: [id number] Latina CraverGREGORY G Kaida Games --------------------------------------------------------------------------------  Indications: Claudication, and peripheral artery disease.  Vascular Interventions: 10/13/2019: Rt Lower Extremity Arteriogram via Rt Common  decreased compared to prior study on 12/11/2020.  Summary: Right: Resting right ankle-brachial index is within normal range. No evidence of significant right lower extremity arterial disease. The right toe-brachial index is normal. Left: Resting left ankle-brachial index  indicates mild left lower extremity arterial disease. The left toe-brachial index is normal.  *See table(s) above for measurements and observations.  Electronically signed by Levora Dredge MD on 04/09/2021 at 1:10:06 PM.    Final    VAS [place] LOWER EXTREMITY ARTERIAL DUPLEX  Result Date: 04/09/2021 LOWER EXTREMITY ARTERIAL DUPLEX STUDY Patient Name:  Melanie Browning  Date of Exam:   04/05/2021 Medical Rec #: [id number]      Accession #:    [id number] Date of Birth: Jun 02, 1941       Patient Gender: F Patient Age:   66Y Exam Location:  [facility] Procedure:      VAS [place] LOWER EXTREMITY ARTERIAL DUPLEX Referring Phys: [id number] Latina Craver Arvetta Araque --------------------------------------------------------------------------------  Indications: S/P Surgery.  Vascular Interventions: 10/13/2019: Rt Lower Extremity Arteriogram via Rt Common                         Femoral Artery Left Popliteal Balloon Angiplasty.                         09/05/2020 PTA and stent left popliteal artery. PTA left                         tibioperoneal trunk. Mechanical thrombectomy.                         10/14/2019: Lt Popliteal Artery Reconstruction.                          02/03/2020: Hybrid Left Lower Extremity Arteriogram via                         Right Common Femoral Artery Left Popliteal Balloon                         Angioplasty. Current ABI:            Rt 1.22, Lt .86 Comparison Study: 09/25/2020 Performing Technologist: Debbe Bales RVS  Examination Guidelines: A complete evaluation includes B-mode imaging, spectral Doppler, color Doppler, and power Doppler as needed of all accessible portions of each vessel. Bilateral testing is considered an integral part of a complete examination. Limited examinations for reoccurring indications may be performed as noted.   +-----------+--------+-----+--------+----------+--------+ LEFT       PSV cm/sRatioStenosisWaveform  Comments  +-----------+--------+-----+--------+----------+--------+ CFA Distal 111                  triphasic          +-----------+--------+-----+--------+----------+--------+ DFA        82                   biphasic           +-----------+--------+-----+--------+----------+--------+ SFA Prox   96                   monophasic         +-----------+--------+-----+--------+----------+--------+ SFA Mid    99  MRN : [id number]  Melanie Browning is a 80 y.o. (1941/02/18) female who presents with chief complaint of No chief complaint on file. Marland Kitchen  History of Present Illness:   The patient returns to the office for followup and review status post angiogram with intervention on 04/10/2021.   Procedure: Percutaneous transluminal angioplasty and stent placement left distal superficial femoral artery and popliteal with Rota Rex thrombectomy distal SFA and popliteal artery.  She did develop a small "blood blister" from her Safe guard which is intact.  She also notes some pain in the lower calf area but is isn't too bad and her claudication symptoms is gone.   No new ulcers or wounds have occurred since the last visit.  There have been no significant changes to the patient's overall health care.  The patient denies amaurosis fugax or recent TIA symptoms. There are no recent neurological changes noted. The patient denies history of DVT, PE or superficial thrombophlebitis. The patient denies recent episodes of angina or shortness of breath.    (previous ABI's Rt=1.05 and Lt=0.86)  No outpatient medications have been marked as taking for the 04/16/21 encounter (Appointment) with Gilda Crease, Latina Craver, MD.    Past Medical History:  Diagnosis Date   Arthritis    Colitis    [place]   HPV (human papilloma virus) infection    Hyperlipidemia    Ischemia of left lower extremity    Osteoporosis    Polymyalgia rheumatica (HCC)    Thyroid disease    Vaginal dysplasia     Past Surgical History:  Procedure Laterality Date   ABDOMINAL HYSTERECTOMY     complete   COLONOSCOPY WITH PROPOFOL N/A 10/04/2020   Procedure: COLONOSCOPY WITH PROPOFOL;  Surgeon: Toledo, Boykin Nearing, MD;  Location: [facility];  Service: Gastroenterology;  Laterality: N/A;   colposcobpy     COLPOSCOPY     CORONARY STENT INTERVENTION N/A 11/15/2020   Procedure: CORONARY STENT INTERVENTION;   Surgeon: Alwyn Pea, MD;  Location: [facility];  Service: Cardiovascular;  Laterality: N/A;   knee athroscopy Left    LAPAROSCOPIC HYSTERECTOMY     LEFT HEART CATH AND CORONARY ANGIOGRAPHY N/A 11/15/2020   Procedure: LEFT HEART CATH AND CORONARY ANGIOGRAPHY;  Surgeon: Alwyn Pea, MD;  Location: [facility];  Service: Cardiovascular;  Laterality: N/A;   LOWER EXTREMITY ANGIOGRAPHY Left 09/05/2020   Procedure: LOWER EXTREMITY ANGIOGRAPHY;  Surgeon: Renford Dills, MD;  Location: [facility];  Service: Cardiovascular;  Laterality: Left;   LOWER EXTREMITY ANGIOGRAPHY Left 04/10/2021   Procedure: LOWER EXTREMITY ANGIOGRAPHY;  Surgeon: Renford Dills, MD;  Location: [facility];  Service: Cardiovascular;  Laterality: Left;    Social History Social History   Tobacco Use   Smoking status: Never   Smokeless tobacco: Never  Vaping Use   Vaping Use: Never used  Substance Use Topics   Alcohol use: Not Currently    Alcohol/week: 3.0 standard drinks    Types: 3 Glasses of wine per week    Comment: 3 glasses of wine per week    Drug use: Never    Family History Family History  Problem Relation Age of Onset   Cancer Mother    Pancreatic cancer Mother    Heart disease Father     Allergies  Allergen Reactions   Lactose Other (See Comments)    Gi upset and diarrhea  decreased compared to prior study on 12/11/2020.  Summary: Right: Resting right ankle-brachial index is within normal range. No evidence of significant right lower extremity arterial disease. The right toe-brachial index is normal. Left: Resting left ankle-brachial index  indicates mild left lower extremity arterial disease. The left toe-brachial index is normal.  *See table(s) above for measurements and observations.  Electronically signed by Levora Dredge MD on 04/09/2021 at 1:10:06 PM.    Final    VAS [place] LOWER EXTREMITY ARTERIAL DUPLEX  Result Date: 04/09/2021 LOWER EXTREMITY ARTERIAL DUPLEX STUDY Patient Name:  Melanie Browning  Date of Exam:   04/05/2021 Medical Rec #: [id number]      Accession #:    [id number] Date of Birth: Jun 02, 1941       Patient Gender: F Patient Age:   66Y Exam Location:  [facility] Procedure:      VAS [place] LOWER EXTREMITY ARTERIAL DUPLEX Referring Phys: [id number] Latina Craver Arvetta Araque --------------------------------------------------------------------------------  Indications: S/P Surgery.  Vascular Interventions: 10/13/2019: Rt Lower Extremity Arteriogram via Rt Common                         Femoral Artery Left Popliteal Balloon Angiplasty.                         09/05/2020 PTA and stent left popliteal artery. PTA left                         tibioperoneal trunk. Mechanical thrombectomy.                         10/14/2019: Lt Popliteal Artery Reconstruction.                          02/03/2020: Hybrid Left Lower Extremity Arteriogram via                         Right Common Femoral Artery Left Popliteal Balloon                         Angioplasty. Current ABI:            Rt 1.22, Lt .86 Comparison Study: 09/25/2020 Performing Technologist: Debbe Bales RVS  Examination Guidelines: A complete evaluation includes B-mode imaging, spectral Doppler, color Doppler, and power Doppler as needed of all accessible portions of each vessel. Bilateral testing is considered an integral part of a complete examination. Limited examinations for reoccurring indications may be performed as noted.   +-----------+--------+-----+--------+----------+--------+ LEFT       PSV cm/sRatioStenosisWaveform  Comments  +-----------+--------+-----+--------+----------+--------+ CFA Distal 111                  triphasic          +-----------+--------+-----+--------+----------+--------+ DFA        82                   biphasic           +-----------+--------+-----+--------+----------+--------+ SFA Prox   96                   monophasic         +-----------+--------+-----+--------+----------+--------+ SFA Mid    99

## 2021-04-17 NOTE — Progress Notes (Signed)
GYN ONC Consult [facility]  Telephone:([phone] [phone]  Patient Care Team: Mick Sell, MD as PCP - General (Infectious Diseases) Benita Gutter, RN as Oncology Nurse Navigator   Name of the patient: Melanie Browning  [id number]  Feb 10, 1941   Date of visit: 04/18/2021   Gynecologic Oncology Consult Visit   Referring Provider: Dr. 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 She is s/p TAH-BSO on 09/05/2015, but final surgical path was negative for dysplasia.  After surgery, she was closely monitored every 6 months with colposcopies and PAPs. She was last seen in clinic on 03/14/2021.   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   Colposcopy 2/22 negative We discussed options for management including surveillance versus Efudex treatment.  She was anxious and preferred treatment.  We prescribed Efudex once a week for 10 weeks and then RTC in 1-2 months for repeat PAP. She completed this course with some interruptions due to vulvar irritation despite skin protectants.   PAP 02/28/21 HSIL, HR HPV- No bleeding or discharge, slight odor.  Recommended she use Estradiol cream to improve health of vaginal mucosa prior to repeat colposcopy.    She recently had stent placed a few days ago in her leg due to artery narrowing.  On Plavix and ASA.  Small blood blister below right groin where vascular access was obtained. Bruises easily, but no bleeding.   She had colonoscopy for follow up of Lymphocytic colitis and that was stable.  Also having problems with intermittent claudication in left leg and underwent vascular percutaneous procedures including a popliteal stent in 12/21.  She is taking Plavix and ASA.    Gynecology Oncology History  She was treated in [place] for cervical and vaginal dysplasia from 2016 to  08/2019. She was referred to our clinic 7/21 after her last visit at [facility] in [place].   9/16 LSIL PAP, HR HPV+, Colposcopic biopsies 10/16, CIN1 on ectocervix, ECC negative. 11/16 [facility] biopsy LSIL, clear margins. ECC negative.  09/05/2015 TLH/BSO Dr Rowan Blase in [facility] for abnormal PAP. No residual disease in specimen. PAP 08/2016 LGSIL  PAP 02/09/2017 HSIL Colposcopy 02/2017 VAIN 1 PAP 05/2017 NILM 10/30/2017 ASCUS, HPV 16+ Colposcopy 11/26/2017 VAIN 1 Colposcopy 03/17/2018 No obvious disease noted (as per note from Dr. Pernell Dupre on 09/28/2019)  PAP 03/25/2019 LSIL/HPV+ Colposcopy with biopsy 04/20/2019 VAIN 1  PAP 09/21/2019, patient reports VAIN 1, HPV+ Colposcopy 7/21 normal PAP 10/25/20 HSIL, +HR HPV PAP 10/25/20 HSIL, -HR HPV  Problem List: Patient Active Problem List   Diagnosis Date Noted   CAD (coronary artery disease) 12/10/2020   Coronary artery disease involving native coronary artery of native heart 11/30/2020   S/P drug eluting coronary stent placement 11/15/2020   Abdominal pain, LLQ (left lower quadrant) 09/28/2020   Long term current use of anticoagulant therapy 09/28/2020   Functional diarrhea 09/28/2020   Chronic venous insufficiency 04/23/2020   PAD (peripheral artery disease) (HCC) 04/19/2020   Hyperlipidemia 04/19/2020   Hypothyroidism 04/19/2020   Popliteal artery injury, left, 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   Disruption of external operation (surgical) wound, not elsewhere classified, initial encounter 11/09/2019   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) 10/29/2019   Stricture, artery (HCC) 10/29/2019   Critical lower limb ischemia (HCC) 10/13/2019   Hypothyroidism  due to acquired atrophy of thyroid 04/07/2019   Syncope and collapse 04/07/2019   Senile osteoporosis 09/20/2016   Sleep disorder 07/18/2016   IGT (impaired glucose tolerance) 06/26/2015   Pap smear  abnormality of cervix with ASCUS favoring dysplasia 06/26/2015   Family history of Guillain-Barre syndrome 04/26/2015   History of herpes genitalis 04/26/2015   History of HPV infection 04/26/2015   Dyslipidemia 04/17/2015   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   PMR (polymyalgia rheumatica) (HCC) 04/17/2015   Arthritis of shoulder region, 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   Past Medical History: Past Medical History:  Diagnosis Date   Arthritis    Colitis    D[place]   HPV (human papilloma virus) infection    Hyperlipidemia    Ischemia of left lower extremity    Osteoporosis    Polymyalgia rheumatica (HCC)    Thyroid disease    Vaginal dysplasia    Past Surgical History: Past Surgical History:  Procedure Laterality Date   ABDOMINAL HYSTERECTOMY     complete   COLONOSCOPY WITH PROPOFOL N/A 10/04/2020   Procedure: COLONOSCOPY WITH PROPOFOL;  Surgeon: Toledo, Boykin Nearing, MD;  Location: [facility] ENDOSCOPY;  Service: Gastroenterology;  Laterality: N/A;   colposcobpy     COLPOSCOPY     CORONARY STENT INTERVENTION N/A 11/15/2020   Procedure: CORONARY STENT INTERVENTION;  Surgeon: Alwyn Pea, MD;  Location: [facility] INVASIVE CV LAB;  Service: Cardiovascular;  Laterality: N/A;   knee athroscopy Left    LAPAROSCOPIC HYSTERECTOMY     LEFT HEART CATH AND CORONARY ANGIOGRAPHY N/A 11/15/2020   Procedure: LEFT HEART CATH AND CORONARY ANGIOGRAPHY;  Surgeon: Alwyn Pea, MD;  Location: [facility] INVASIVE CV LAB;  Service: Cardiovascular;  Laterality: N/A;   LOWER EXTREMITY ANGIOGRAPHY Left 09/05/2020   Procedure: LOWER EXTREMITY ANGIOGRAPHY;  Surgeon: Renford Dills, MD;  Location: [facility] INVASIVE CV LAB;  Service: Cardiovascular;  Laterality: Left;   LOWER EXTREMITY ANGIOGRAPHY Left 04/10/2021   Procedure: LOWER EXTREMITY ANGIOGRAPHY;  Surgeon: Renford Dills, MD;  Location: [facility] INVASIVE CV LAB;  Service: Cardiovascular;  Laterality: Left;    Past Gynecologic History:  Menarche: 14 Menstrual details: Lasts 3 days Menses regular: yes Last Menstrual Period: Unknown History of OCP/HRT use: No History of Abnormal pap: yes,  as per HPI Last pap:  08/2019 History of STDs: The patient reports a past history of: herpes. Sexually active: not asked  OB History:  OB History  No obstetric history on file.   Family History: Family History  Problem Relation Age of Onset   Cancer Mother    Pancreatic cancer Mother    Heart disease Father    Social History: Social History   Socioeconomic History   Marital status: Married    Spouse name: Norm    Number of children: 1   Years of education: Not on file   Highest education level: Not on file  Occupational History   Occupation: retired   Tobacco Use   Smoking status: Never   Smokeless tobacco: Never  Vaping Use   Vaping Use: Never used  Substance and Sexual Activity   Alcohol use: Not Currently    Alcohol/week: 3.0 standard drinks    Types: 3 Glasses of wine per week    Comment: 3 glasses of wine per week    Drug use: Never   Sexual activity: Yes  Other Topics Concern   Not on file  Social History Narrative  Lives with [facility] at [facility]    Social Determinants of Health   Financial Resource Strain: Not on file  Food Insecurity: Not on file  Transportation Needs: Not on file  Physical Activity: Not on file  Stress: Not on file  Social Connections: Not on file  Intimate Partner Violence: Not on file    Allergies: Allergies  Allergen Reactions   Lactose Other (See Comments)    Gi upset and diarrhea     Current Medications: Current Outpatient Medications  Medication Sig Dispense Refill   amitriptyline (ELAVIL) 10 MG tablet Take 1 tablet (10 mg total) by mouth at bedtime. 90 tablet 0   amoxicillin-clavulanate (AUGMENTIN) 875-125 MG tablet Take 1 tablet by mouth 2 (two) times daily. 20 tablet 0   aspirin EC 81 MG tablet Take 81 mg by mouth every  evening. Swallow whole.     atorvastatin (LIPITOR) 40 MG tablet Take 40 mg by mouth every evening.      clopidogrel (PLAVIX) 75 MG tablet TAKE 1 TABLET BY MOUTH DAILY (Patient taking differently: Take 75 mg by mouth every evening.) 30 tablet 5   diphenoxylate-atropine (LOMOTIL) 2.5-0.025 MG tablet Take 1 tablet by mouth daily as needed for diarrhea or loose stools.     estradiol (ESTRACE) 0.1 MG/GM vaginal cream Insert 1/4 applicator full into vagina at night twice a week (Patient taking differently: Place 1 Applicatorful vaginally 2 (two) times a week. Insert 1/4 applicator full into vagina) 42.5 g 0   levothyroxine (SYNTHROID) 75 MCG tablet Take 75 mcg by mouth daily before breakfast.      No current facility-administered medications for this visit.    Review of Systems General: negative for, fevers, chills, fatigue, changes in sleep Skin: some bruising Eyes: negative for, changes in vision, pain, diplopia HEENT: negative for, change in hearing, pain, discharge, tinnitus, vertigo, voice changes, sore throat, neck masses Pulmonary: negative for, dyspnea, orthopnea, productive cough Cardiac: negative for, palpitations, syncope, pain, discomfort, pressure Gastrointestinal: negative for, dysphagia, nausea, vomiting, jaundice, pain, constipation, diarrhea, hematemesis, hematochezia Genitourinary/Sexual: negative for, dysuria, discharge, hesitancy, nocturia, retention, stones, infections, STD's, incontinence Musculoskeletal: negative for, pain, stiffness, swelling, range of motion limitation Hematology: negative for, easy bruising, bleeding; positive for vaginal odor, reports using Replense  Neurologic/Psych: negative for, headaches, seizures, paralysis, weakness, tremor, change in gait, change in sensation, mood swings, depression, anxiety, change in memory  Objective:  Physical Examination:  BP (!) 152/79   Pulse 85   Temp 97.8 F (36.6 C)   Resp 20   Wt 146 lb (66.2 kg)   SpO2 100%    BMI 22.20 kg/m    ECOG Performance Status: 1 - Symptomatic but completely ambulatory  General appearance: alert, cooperative and appears stated age HEENT:PERRLA and neck supple with midline trachea Lymph node survey: non-palpable, axillary, inguinal, supraclavicular Cardiovascular: regular rate and rhythm, no murmurs or gallops Respiratory: normal air entry, lungs clear to auscultation Breast exam: not examined Abdomen: soft, non-tender, without masses or organomegaly, no hernias and well healed incision Back: inspection of back is normal Extremities: extremities normal, atraumatic, no cyanosis or edema and healing popliteal vascular surgery present over Left posterior knee Skin exam - normal coloration and turgor, no rashes, no suspicious skin lesions noted. Neurological exam reveals alert, oriented, normal speech, no focal findings or movement disorder noted.  Pelvic: exam chaperoned by nurse;  Vulva: atrophic normal appearing vulva with no lesions, ulceration, tenderness; Vagina: atrophic, white, no ulceration or lesions; Bimanual: no masses. Rectal: deferred  Colposcopy: time out performed.  Vagina soaked with acetic acid and visualized with white and green light.  Atrophic mucosa. No lesion seen today.  Small area of mucosal trauma from the speculum anteriorly touched up with silver nitrate.   Assessment:  Melanie Browning is a 80 y.o. female s/p TAH-BSO 09/05/2015 for dysplasia but no dysplasia in specimen.  Low grade PAP abnormalities since then with colposcopy negative.  PAP 12/20 VAIN1 HPV+.  Moved to this area and first seen 7/21 with normal colposcopy.  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  Colposcopy of vagina 2/22 with no lesions seen.   No symptoms. We discussed options for management including surveillance versus Efudex treatment.  She is anxious.and preferred treatment.  She used  Efudex once a week for 10 weeks with some interruption for vulvar irritation. PAP 02/28/21 HSIL, HR HPV-.  No visible  lesion on colposcopy today.  Recent arterial stent with vascular for arterial narrowing in right popliteal area.   Medical co-morbidities complicating care: PAD, Dyslipidemia  Plan:   Problem List Items Addressed This Visit       Genitourinary   Pap smear abnormality of cervix with ASCUS favoring dysplasia - Primary    Tried to have recent HSIL PAP reviewed at [facility], but slide missing at [facility].  We treated with Premarin cream to try to improve atrophy and make evaluation of the vagina more feasible.  No lesions seen today with colposcopy.  Will have the PAP from today reviewed if still HSIL.  May repeat colposcopy with Lugol's solution in the future.  She is very anxious about this and not sexually active, so upper vaginectomy may be an option.  If we decide to go in this direction, will probably have her see Dr Lafe Garin [facility] for this procedure.  If PAP today is low grade will have her RTC in 6 months.  She is basically asymptomatic, but for some odor and hopes to install a bidet.  Plans to go on a vacation to [place] in the near future.   The patient's diagnosis, an outline of the further diagnostic and laboratory studies which will be required, the recommendation, and alternatives were discussed.  All questions were answered to the patient's satisfaction.  Melanie Badder, RN  I personally interviewed and examined the patient. Agreed with the above/below plan of care. I have directly contributed to assessment and plan of care of this patient and educated and discussed with patient and family.  Leida Lauth, MD

## 2021-04-18 ENCOUNTER — Inpatient Hospital Stay: Payer: Medicare Other | Attending: Obstetrics and Gynecology | Admitting: Obstetrics and Gynecology

## 2021-04-18 VITALS — BP 152/79 | HR 85 | Temp 97.8°F | Resp 20 | Wt 146.0 lb

## 2021-04-18 DIAGNOSIS — Z87410 Personal history of cervical dysplasia: Secondary | ICD-10-CM | POA: Diagnosis not present

## 2021-04-18 DIAGNOSIS — Z7982 Long term (current) use of aspirin: Secondary | ICD-10-CM | POA: Diagnosis not present

## 2021-04-18 DIAGNOSIS — Z7902 Long term (current) use of antithrombotics/antiplatelets: Secondary | ICD-10-CM | POA: Insufficient documentation

## 2021-04-18 DIAGNOSIS — Z7901 Long term (current) use of anticoagulants: Secondary | ICD-10-CM | POA: Diagnosis not present

## 2021-04-18 DIAGNOSIS — Z90710 Acquired absence of both cervix and uterus: Secondary | ICD-10-CM | POA: Diagnosis not present

## 2021-04-18 DIAGNOSIS — B009 Herpesviral infection, unspecified: Secondary | ICD-10-CM | POA: Diagnosis not present

## 2021-04-18 DIAGNOSIS — Z90722 Acquired absence of ovaries, bilateral: Secondary | ICD-10-CM | POA: Insufficient documentation

## 2021-04-18 DIAGNOSIS — M353 Polymyalgia rheumatica: Secondary | ICD-10-CM | POA: Insufficient documentation

## 2021-04-18 DIAGNOSIS — E034 Atrophy of thyroid (acquired): Secondary | ICD-10-CM | POA: Insufficient documentation

## 2021-04-18 DIAGNOSIS — R87610 Atypical squamous cells of undetermined significance on cytologic smear of cervix (ASC-US): Secondary | ICD-10-CM | POA: Insufficient documentation

## 2021-04-18 DIAGNOSIS — Z87411 Personal history of vaginal dysplasia: Secondary | ICD-10-CM | POA: Diagnosis not present

## 2021-04-18 DIAGNOSIS — I872 Venous insufficiency (chronic) (peripheral): Secondary | ICD-10-CM | POA: Diagnosis not present

## 2021-04-18 DIAGNOSIS — I739 Peripheral vascular disease, unspecified: Secondary | ICD-10-CM | POA: Diagnosis not present

## 2021-04-18 DIAGNOSIS — I2510 Atherosclerotic heart disease of native coronary artery without angina pectoris: Secondary | ICD-10-CM | POA: Diagnosis not present

## 2021-04-18 DIAGNOSIS — Z79899 Other long term (current) drug therapy: Secondary | ICD-10-CM | POA: Insufficient documentation

## 2021-04-20 LAB — IGP, APTIMA HPV: HPV Aptima: POSITIVE — AB

## 2021-04-25 ENCOUNTER — Ambulatory Visit: Payer: Medicare Other

## 2021-04-26 ENCOUNTER — Telehealth: Payer: Self-pay

## 2021-04-26 NOTE — Telephone Encounter (Signed)
Request sent to [facility] to have 04/20/2021 sent to [facility] for review.

## 2021-05-02 ENCOUNTER — Ambulatory Visit: Payer: Medicare Other

## 2021-05-10 ENCOUNTER — Telehealth: Payer: Self-pay

## 2021-05-10 NOTE — Telephone Encounter (Signed)
Received message that Melanie Browning called with questions regarding her recent pap smear. Call returned and received voicemail. Left voicemail to call me directly.

## 2021-05-11 ENCOUNTER — Telehealth: Payer: Self-pay

## 2021-05-11 NOTE — Telephone Encounter (Signed)
Called and spoke with [facility] for update on pap smear that was requested be sent to [facility] for consult. They state they have not sent specimen as they are awaiting authorization form to release be faxed back. This was faxed on 05/02/21 with confirmation and faxed again today, 05/11/21 with confirmation.

## 2021-05-16 ENCOUNTER — Ambulatory Visit: Payer: Medicare Other

## 2021-05-28 ENCOUNTER — Ambulatory Visit (INDEPENDENT_AMBULATORY_CARE_PROVIDER_SITE_OTHER): Payer: Medicare Other

## 2021-05-28 ENCOUNTER — Other Ambulatory Visit: Payer: Self-pay

## 2021-05-28 ENCOUNTER — Encounter (INDEPENDENT_AMBULATORY_CARE_PROVIDER_SITE_OTHER): Payer: Self-pay

## 2021-05-28 ENCOUNTER — Ambulatory Visit (INDEPENDENT_AMBULATORY_CARE_PROVIDER_SITE_OTHER): Payer: Medicare Other | Admitting: Vascular Surgery

## 2021-05-28 DIAGNOSIS — I70212 Atherosclerosis of native arteries of extremities with intermittent claudication, left leg: Secondary | ICD-10-CM

## 2021-06-06 ENCOUNTER — Other Ambulatory Visit: Payer: Self-pay

## 2021-06-06 ENCOUNTER — Other Ambulatory Visit: Payer: Self-pay | Admitting: Nurse Practitioner

## 2021-06-06 ENCOUNTER — Inpatient Hospital Stay: Payer: Medicare Other | Attending: Obstetrics and Gynecology | Admitting: Obstetrics and Gynecology

## 2021-06-06 VITALS — BP 185/92 | HR 68 | Temp 98.8°F | Resp 16 | Wt 148.5 lb

## 2021-06-06 DIAGNOSIS — Z90710 Acquired absence of both cervix and uterus: Secondary | ICD-10-CM | POA: Insufficient documentation

## 2021-06-06 DIAGNOSIS — N893 Dysplasia of vagina, unspecified: Secondary | ICD-10-CM | POA: Diagnosis not present

## 2021-06-06 DIAGNOSIS — R87610 Atypical squamous cells of undetermined significance on cytologic smear of cervix (ASC-US): Secondary | ICD-10-CM

## 2021-06-06 DIAGNOSIS — Z90722 Acquired absence of ovaries, bilateral: Secondary | ICD-10-CM | POA: Diagnosis not present

## 2021-06-06 NOTE — Addendum Note (Signed)
Addended by: Leida Lauth on: 06/06/2021 03:08 PM   Modules accepted: Level of Service

## 2021-06-06 NOTE — Progress Notes (Signed)
sur

## 2021-06-06 NOTE — Progress Notes (Signed)
GYN ONC Consult [facility]  [phone] [phone]  Patient Care Team: Mick Sell, MD as PCP - General (Infectious Diseases) Benita Gutter, RN as Oncology Nurse Navigator   Name of the patient: Melanie Browning  [id number]  09/20/1941   Date of visit: 06/06/2021   Gynecologic Oncology Consult Visit   Referring Provider: Dr. 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 She is s/p TAH-BSO on 09/05/2015, but final surgical path was negative for dysplasia.  After surgery, she was closely monitored every 6 months with colposcopies and PAPs. She was last seen in clinic on 04/18/21.   Recently tried to have HSIL Pap reviewed at [facility] but slide missing at [facility].  We treated with Premarin cream to try to improve atrophy and make evaluation of the vagina more feasible.  No lesions seen with colposcopy.    Pap 04/18/21- ASC-H; cannot exclude high grade squamous intraepithelial.   Will have the PAP from today reviewed if still HSIL.  May repeat colposcopy with Lugol's solution in the future.  She is very anxious about this and not sexually active, so upper vaginectomy may be an option.  If we decide to go in this direction, will probably have her see Dr Lafe Garin [facility] for this procedure.  If PAP today is low grade will have her RTC in 6 months.  She is basically asymptomatic, but for some odor and hopes to install a bidet.  Plans to go on a vacation to [place] in the near future.  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   Colposcopy 2/22 negative We discussed options for management including surveillance versus Efudex treatment.  She was anxious and preferred treatment.  We prescribed Efudex once a week for 10 weeks and then RTC in 1-2 months for repeat PAP. She completed this course with some  interruptions due to vulvar irritation despite skin protectants.   PAP 02/28/21 HSIL, HR HPV- No bleeding or discharge, slight odor.  Recommended she use Estradiol cream to improve health of vaginal mucosa prior to repeat colposcopy.    She recently had stent placed a few days ago in her leg due to artery narrowing.  On Plavix and ASA.  Small blood blister below right groin where vascular access was obtained. Bruises easily, but no bleeding.   She had colonoscopy for follow up of Lymphocytic colitis and that was stable.  Also having problems with intermittent claudication in left leg and underwent vascular percutaneous procedures including a popliteal stent in 12/21.  She is taking Plavix and ASA.    Gynecology Oncology History  She was treated in [place] for cervical and vaginal dysplasia from 2016 to 08/2019. She was referred to our clinic 7/21 after her last visit at [facility] [place] in [place].   9/16 LSIL PAP, HR HPV+, Colposcopic biopsies 10/16, CIN1 on ectocervix, ECC negative. 11/16 [facility] biopsy LSIL, clear margins. ECC negative.  09/05/2015 TLH/BSO Dr Rowan Blase in [facility] for abnormal PAP. No residual disease in specimen. PAP 08/2016 LGSIL  PAP 02/09/2017 HSIL Colposcopy 02/2017 VAIN 1 PAP 05/2017 NILM 10/30/2017 ASCUS, HPV 16+ Colposcopy 11/26/2017 VAIN 1 Colposcopy 03/17/2018 No obvious disease noted (as per note from Dr. Pernell Dupre on 09/28/2019)  PAP 03/25/2019 LSIL/HPV+ Colposcopy with biopsy 04/20/2019 VAIN 1  PAP 09/21/2019, patient reports VAIN 1, HPV+ Colposcopy 7/21  normal PAP 10/25/20 HSIL, +HR HPV PAP 10/25/20 HSIL, -HR HPV  Problem List: Patient Active Problem List   Diagnosis Date Noted   CAD (coronary artery disease) 12/10/2020   Coronary artery disease involving native coronary artery of native heart 11/30/2020   S/P drug eluting coronary stent placement 11/15/2020   Abdominal pain, LLQ (left lower quadrant) 09/28/2020   Long term current use of anticoagulant  therapy 09/28/2020   Functional diarrhea 09/28/2020   Chronic venous insufficiency 04/23/2020   PAD (peripheral artery disease) (HCC) 04/19/2020   Hyperlipidemia 04/19/2020   Hypothyroidism 04/19/2020   Popliteal artery injury, left, 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   Disruption of external operation (surgical) wound, not elsewhere classified, initial encounter 11/09/2019   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) 10/29/2019   Stricture, artery (HCC) 10/29/2019   Critical lower limb ischemia (HCC) 10/13/2019   Hypothyroidism due to acquired atrophy of thyroid 04/07/2019   Syncope and collapse 04/07/2019   Senile osteoporosis 09/20/2016   Sleep disorder 07/18/2016   IGT (impaired glucose tolerance) 06/26/2015   Pap smear abnormality of cervix with ASCUS favoring dysplasia 06/26/2015   Family history of Guillain-Barre syndrome 04/26/2015   History of herpes genitalis 04/26/2015   History of HPV infection 04/26/2015   Dyslipidemia 04/17/2015   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   PMR (polymyalgia rheumatica) (HCC) 04/17/2015   Arthritis of shoulder region, 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   Past Medical History: Past Medical History:  Diagnosis Date   Arthritis    Colitis    Dry [place]   HPV (human papilloma virus) infection    Hyperlipidemia    Ischemia of left lower extremity    Osteoporosis    Polymyalgia rheumatica (HCC)    Thyroid disease    Vaginal dysplasia    Past Surgical History: Past Surgical History:  Procedure Laterality Date   ABDOMINAL HYSTERECTOMY     complete   COLONOSCOPY WITH PROPOFOL N/A 10/04/2020   Procedure: COLONOSCOPY WITH PROPOFOL;  Surgeon: Toledo, Boykin Nearing, MD;  Location: [facility] ENDOSCOPY;  Service: Gastroenterology;  Laterality: N/A;   colposcobpy     COLPOSCOPY     CORONARY STENT INTERVENTION N/A 11/15/2020   Procedure:  CORONARY STENT INTERVENTION;  Surgeon: Alwyn Pea, MD;  Location: [facility] INVASIVE CV LAB;  Service: Cardiovascular;  Laterality: N/A;   knee athroscopy Left    LAPAROSCOPIC HYSTERECTOMY     LEFT HEART CATH AND CORONARY ANGIOGRAPHY N/A 11/15/2020   Procedure: LEFT HEART CATH AND CORONARY ANGIOGRAPHY;  Surgeon: Alwyn Pea, MD;  Location: [facility] INVASIVE CV LAB;  Service: Cardiovascular;  Laterality: N/A;   LOWER EXTREMITY ANGIOGRAPHY Left 09/05/2020   Procedure: LOWER EXTREMITY ANGIOGRAPHY;  Surgeon: Renford Dills, MD;  Location: [facility] INVASIVE CV LAB;  Service: Cardiovascular;  Laterality: Left;   LOWER EXTREMITY ANGIOGRAPHY Left 04/10/2021   Procedure: LOWER EXTREMITY ANGIOGRAPHY;  Surgeon: Renford Dills, MD;  Location: [facility] INVASIVE CV LAB;  Service: Cardiovascular;  Laterality: Left;   Past Gynecologic History:  Menarche: 14 Menstrual details: Lasts 3 days Menses regular: yes Last Menstrual Period: Unknown History of OCP/HRT use: No History of Abnormal pap: yes,  as per HPI Last pap:  08/2019 History of STDs: The patient reports a past history of: herpes. Sexually active: not asked  OB History:  OB History  No obstetric history on file.   Family History: Family History  Problem Relation  Age of Onset   Cancer Mother    Pancreatic cancer Mother    Heart disease Father    Social History: Social History   Socioeconomic History   Marital status: Married    Spouse name: Norm    Number of children: 1   Years of education: Not on file   Highest education level: Not on file  Occupational History   Occupation: retired   Tobacco Use   Smoking status: Never   Smokeless tobacco: Never  Vaping Use   Vaping Use: Never used  Substance and Sexual Activity   Alcohol use: Not Currently    Alcohol/week: 3.0 standard drinks    Types: 3 Glasses of wine per week    Comment: 3 glasses of wine per week    Drug use: Never   Sexual activity: Yes  Other Topics Concern    Not on file  Social History Narrative   Lives with spouse at [facility]    Social Determinants of Health   Financial Resource Strain: Not on file  Food Insecurity: Not on file  Transportation Needs: Not on file  Physical Activity: Not on file  Stress: Not on file  Social Connections: Not on file  Intimate Partner Violence: Not on file    Allergies: Allergies  Allergen Reactions   Lactose Other (See Comments)    Gi upset and diarrhea     Current Medications: Current Outpatient Medications  Medication Sig Dispense Refill   amitriptyline (ELAVIL) 10 MG tablet Take 1 tablet (10 mg total) by mouth at bedtime. 90 tablet 0   amoxicillin-clavulanate (AUGMENTIN) 875-125 MG tablet Take 1 tablet by mouth 2 (two) times daily. 20 tablet 0   aspirin EC 81 MG tablet Take 81 mg by mouth every evening. Swallow whole.     atorvastatin (LIPITOR) 40 MG tablet Take 40 mg by mouth every evening.      clopidogrel (PLAVIX) 75 MG tablet TAKE 1 TABLET BY MOUTH DAILY 30 tablet 5   diphenoxylate-atropine (LOMOTIL) 2.5-0.025 MG tablet Take 1 tablet by mouth daily as needed for diarrhea or loose stools.     estradiol (ESTRACE) 0.1 MG/GM vaginal cream Insert 1/4 applicator full into vagina at night twice a week (Patient taking differently: Place 1 Applicatorful vaginally 2 (two) times a week. Insert 1/4 applicator full into vagina) 42.5 g 0   levothyroxine (SYNTHROID) 75 MCG tablet Take 75 mcg by mouth daily before breakfast.      No current facility-administered medications for this visit.    Review of Systems General: negative for, fevers, chills, fatigue, changes in sleep Skin: some bruising Eyes: negative for, changes in vision, pain, diplopia HEENT: negative for, change in hearing, pain, discharge, tinnitus, vertigo, voice changes, sore throat, neck masses Pulmonary: negative for, dyspnea, orthopnea, productive cough Cardiac: negative for, palpitations, syncope, pain, discomfort,  pressure Gastrointestinal: negative for, dysphagia, nausea, vomiting, jaundice, pain, constipation, diarrhea, hematemesis, hematochezia Genitourinary/Sexual: negative for, dysuria, discharge, hesitancy, nocturia, retention, stones, infections, STD's, incontinence Musculoskeletal: negative for, pain, stiffness, swelling, range of motion limitation Hematology: negative for, easy bruising, bleeding; positive for vaginal odor, reports using Replense  Neurologic/Psych: negative for, headaches, seizures, paralysis, weakness, tremor, change in gait, change in sensation, mood swings, depression, anxiety, change in memory  Objective:  Physical Examination:  BP (!) 177/91   Pulse 78   Temp 98.8 F (37.1 C)   Resp 16   Wt 148 lb 8 oz (67.4 kg)   SpO2 100%   BMI 22.58 kg/m  ECOG Performance Status: 1 - Symptomatic but completely ambulatory  General appearance: alert, cooperative and appears stated age HEENT:PERRLA and neck supple with midline trachea Lymph node survey: non-palpable, axillary, inguinal, supraclavicular Cardiovascular: regular rate and rhythm, no murmurs or gallops Respiratory: normal air entry, lungs clear to auscultation Breast exam: not examined Abdomen: soft, non-tender, without masses or organomegaly, no hernias and well healed incision Back: inspection of back is normal Extremities: extremities normal, atraumatic, no cyanosis or edema and healing popliteal vascular surgery present over Left posterior knee Skin exam - normal coloration and turgor, no rashes, no suspicious skin lesions noted. Neurological exam reveals alert, oriented, normal speech, no focal findings or movement disorder noted.  Pelvic: exam chaperoned by nurse;  Vulva: atrophic normal appearing vulva with no lesions, ulceration, tenderness; Vagina: atrophic, white, no ulceration or lesions; Bimanual: no masses. Rectal: deferred  Colposcopy: time out performed.  Patient noted to be on Plavix and ASA.   Vagina soaked with Lugols.  Atrophic mucosa. Small area of nonstaining seen today posterior to the left fornix.  Biopsy done and cauterized with silver nitrate.   Assessment:  Monique Jesperson is a 80 y.o. female s/p TAH-BSO 09/05/2015 for dysplasia but no dysplasia in specimen.  Low grade PAP abnormalities since then with colposcopy negative.  PAP 12/20 VAIN1 HPV+.  Moved to this area and first seen 7/21 with normal colposcopy.  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  Colposcopy of vagina 2/22 with no lesions seen.   No symptoms. We discussed options for management including surveillance versus Efudex treatment.  She is anxious.and preferred treatment.  She used  Efudex once a week for 10 weeks with some interruption for vulvar irritation. PAP 02/28/21 HSIL, HR HPV-. Confirmed on [facility] review.  On colposcopy today with Lugol's small area of non staining biopsied.  Recent arterial stent with vascular for arterial narrowing in right popliteal area. Patient on Plavix and ASA.  Medical co-morbidities complicating care: PAD, Dyslipidemia  Plan:   Problem List Items Addressed This Visit       Genitourinary   Pap smear abnormality of cervix with ASCUS favoring dysplasia - Primary   If biopsy shows HSIL will consider laser ablation.  If mildly abnormal or normal then will RTC in 6 months for follow up exam and PAP.  The patient's diagnosis, an outline of the further diagnostic and laboratory studies which will be required, the recommendation, and alternatives were discussed.  All questions were answered to the patient's satisfaction.  Alinda Dooms, NP  I personally interviewed and examined the patient. Agreed with the above/below plan of care. I have directly contributed to assessment and plan of care of this patient and educated and discussed with patient and family.  Leida Lauth, MD

## 2021-06-11 ENCOUNTER — Encounter: Payer: Self-pay | Admitting: Nurse Practitioner

## 2021-06-11 LAB — SURGICAL PATHOLOGY

## 2021-06-13 ENCOUNTER — Encounter (INDEPENDENT_AMBULATORY_CARE_PROVIDER_SITE_OTHER): Payer: Self-pay | Admitting: *Deleted

## 2021-06-15 ENCOUNTER — Telehealth: Payer: Self-pay

## 2021-06-15 NOTE — Telephone Encounter (Signed)
Call received from Ms. Mcevoy to review biopsy results. Results reviewed. Per Dr. Annye Rusk note, if biopsy shows HSIL will consider laser ablation.  If mildly abnormal or normal then will RTC in 6 months for follow up exam and PAP. She currently has a 6 month follow up arranged. Dr. Johnnette Litter will also review her results Wednesday. She reports since having Lugol's applied 06/06/21 she is having a brown discharge that is now more yellow. It is a lot per her. Denies bleeding. She is wearing a pad for the drainage. Discussed with NP, Consuello Masse, and she would like to see her Monday if she is still having discharge. Voicemail left, per Ms. Pettijohn's request, with this information.

## 2021-06-28 ENCOUNTER — Telehealth: Payer: Self-pay | Admitting: Nurse Practitioner

## 2021-06-28 NOTE — Telephone Encounter (Signed)
Called patient to review biopsy results. No answer. Left vm.

## 2021-06-28 NOTE — Telephone Encounter (Signed)
Vm

## 2021-07-04 ENCOUNTER — Telehealth: Payer: Self-pay | Admitting: Nurse Practitioner

## 2021-07-04 DIAGNOSIS — R87610 Atypical squamous cells of undetermined significance on cytologic smear of cervix (ASC-US): Secondary | ICD-10-CM

## 2021-07-04 DIAGNOSIS — B9689 Other specified bacterial agents as the cause of diseases classified elsewhere: Secondary | ICD-10-CM

## 2021-07-04 DIAGNOSIS — N760 Acute vaginitis: Secondary | ICD-10-CM

## 2021-07-04 MED ORDER — METRONIDAZOLE 500 MG PO TABS: 500.0000 mg | ORAL_TABLET | Freq: Two times a day (BID) | ORAL | 0 refills | Status: DC

## 2021-07-04 NOTE — Telephone Encounter (Signed)
Spoke to patient regarding pathology results. She is also complaining of vaginal odor and discharge since her biopsy. Suspect bacterial vaginosis. Will send flagyl 500 mg twice daily for seven days.

## 2021-07-07 ENCOUNTER — Other Ambulatory Visit: Payer: Self-pay | Admitting: Gastroenterology

## 2021-07-07 DIAGNOSIS — K529 Noninfective gastroenteritis and colitis, unspecified: Secondary | ICD-10-CM

## 2021-07-11 ENCOUNTER — Other Ambulatory Visit: Payer: Self-pay | Admitting: Nurse Practitioner

## 2021-07-11 DIAGNOSIS — B9689 Other specified bacterial agents as the cause of diseases classified elsewhere: Secondary | ICD-10-CM

## 2021-07-11 MED ORDER — METRONIDAZOLE 0.75 % VA GEL: VAGINAL | 0 refills | Status: DC

## 2021-07-11 NOTE — Progress Notes (Signed)
Ladora Daniel, RN spoke to patient by phone. She did not take flagyl d/t concern of side effects. Prescription for metrogel sent to pharmacy.

## 2021-07-14 ENCOUNTER — Encounter: Payer: Self-pay | Admitting: Gastroenterology

## 2021-07-24 ENCOUNTER — Other Ambulatory Visit: Payer: Self-pay

## 2021-07-24 ENCOUNTER — Other Ambulatory Visit: Payer: Self-pay | Admitting: Gastroenterology

## 2021-07-24 DIAGNOSIS — K529 Noninfective gastroenteritis and colitis, unspecified: Secondary | ICD-10-CM

## 2021-07-24 MED ORDER — AMITRIPTYLINE HCL 10 MG PO TABS: 10.0000 mg | ORAL_TABLET | Freq: Every day | ORAL | 0 refills | Status: DC

## 2021-07-24 NOTE — Progress Notes (Signed)
Sent in refill on amitriptaline  as requested

## 2021-07-25 ENCOUNTER — Telehealth: Payer: Self-pay | Admitting: *Deleted

## 2021-07-25 NOTE — Telephone Encounter (Signed)
Patient called requesting to speak with Lauren regarding issues she is having with the Metronidazole Gel. She states that she had pre physical labs done and it showed that she has a bacterial UTI asking if this is in conjunction with bacterial Vaginosis. Does she need to take something for UTI? She states that she completed the metronidazole and the odor and symptoms have improved. She reports that she is not having any discharge at this time. Please return her call or advise.  Urinalysis w/Microscopic Specimen:  Urine  Ref Range & Units 1 d ago  Color Yellow, Violet, Light Violet, Dark Violet Yellow   Clarity Clear, Other Clear   Specific Gravity 1.000 - 1.030 1.015   pH, Urine 5.0 - 8.0 6.0   Protein, Urinalysis Negative, Trace mg/dL Negative   Glucose, Urinalysis Negative mg/dL Negative   Ketones, Urinalysis Negative mg/dL Negative   Blood, Urinalysis Negative Negative   Nitrite, Urinalysis Negative Negative   Leukocyte Esterase, Urinalysis Negative Small Abnormal    White Blood Cells, Urinalysis None Seen, 0-3 /hpf 0-3   Red Blood Cells, Urinalysis None Seen, 0-3 /hpf 0-3   Bacteria, Urinalysis None Seen /hpf Moderate Abnormal    Squamous Epithelial Cells, Urinalysis Rare, Few, None Seen /hpf Few   Resulting Agency  [facility]  Specimen Collected: 07/24/21 08:54 Last Resulted: 07/24/21 12:49  Received From: [facility]  Result Received: 07/24/21 15:20  CBC w/auto Differential (5 Part) Specimen:  Blood  Ref Range & Units 1 d ago  WBC (White Blood Cell Count) 4.1 - 10.2 10^3/uL 4.9   RBC (Red Blood Cell Count) 4.04 - 5.48 10^6/uL 4.04   Hemoglobin 12.0 - 15.0 gm/dL 11.5   Hematocrit 72.6 - 47.0 % 39.6   MCV (Mean Corpuscular Volume) 80.0 - 100.0 fl 98.0   MCH (Mean Corpuscular Hemoglobin) 27.0 - 31.2 pg 32.4 High    MCHC (Mean Corpuscular Hemoglobin Concentration) 32.0 - 36.0 gm/dL 20.3   Platelet Count 559 - 450 10^3/uL 195   RDW-CV (Red Cell  Distribution Width) 11.6 - 14.8 % 13.0   MPV (Mean Platelet Volume) 9.4 - 12.4 fl 10.2   Neutrophils 1.50 - 7.80 10^3/uL 2.61   Lymphocytes 1.00 - 3.60 10^3/uL 1.39   Monocytes 0.00 - 1.50 10^3/uL 0.67   Eosinophils 0.00 - 0.55 10^3/uL 0.15   Basophils 0.00 - 0.09 10^3/uL 0.04   Neutrophil % 32.0 - 70.0 % 53.6   Lymphocyte % 10.0 - 50.0 % 28.5   Monocyte % 4.0 - 13.0 % 13.8 High    Eosinophil % 1.0 - 5.0 % 3.1   Basophil% 0.0 - 2.0 % 0.8   Immature Granulocyte % <=0.7 % 0.2   Immature Granulocyte Count <=0.06 10^3/L 0.01   Resulting Agency  [facility]  Specimen Collected: 07/24/21 08:48 Last Resulted: 07/24/21 09:43  Received From: [facility]  Result Received: 07/24/21 15:20

## 2021-07-27 ENCOUNTER — Telehealth: Payer: Self-pay | Admitting: Nurse Practitioner

## 2021-07-27 DIAGNOSIS — N760 Acute vaginitis: Secondary | ICD-10-CM

## 2021-07-27 DIAGNOSIS — B9689 Other specified bacterial agents as the cause of diseases classified elsewhere: Secondary | ICD-10-CM

## 2021-07-27 NOTE — Telephone Encounter (Signed)
I called patient and we discussed Melanie Mortimer, NP response and she thanked me for letting her know, She states she will touch base with Dr Sampson Goon next week when he is back in the office

## 2021-07-27 NOTE — Telephone Encounter (Signed)
Left vm for patient

## 2021-08-22 ENCOUNTER — Encounter: Payer: Self-pay | Admitting: Gastroenterology

## 2021-09-05 ENCOUNTER — Ambulatory Visit: Payer: Medicare Other

## 2021-09-12 ENCOUNTER — Other Ambulatory Visit: Payer: Self-pay | Admitting: Gastroenterology

## 2021-09-12 ENCOUNTER — Other Ambulatory Visit: Payer: Self-pay

## 2021-09-12 MED ORDER — DIPHENOXYLATE-ATROPINE 2.5-0.025 MG PO TABS: 1.0000 | ORAL_TABLET | Freq: Every day | ORAL | 0 refills | Status: DC | PRN

## 2021-09-12 NOTE — Progress Notes (Signed)
SENT IN REFILL AS REQUESTED 

## 2021-09-17 ENCOUNTER — Other Ambulatory Visit: Payer: Self-pay | Admitting: Gastroenterology

## 2021-09-17 DIAGNOSIS — K52831 Collagenous colitis: Secondary | ICD-10-CM

## 2021-09-17 MED ORDER — BUDESONIDE 3 MG PO CPEP: 9.0000 mg | ORAL_CAPSULE | Freq: Every day | ORAL | 3 refills | Status: AC

## 2021-09-20 ENCOUNTER — Other Ambulatory Visit: Payer: Self-pay | Admitting: Gastroenterology

## 2021-09-27 ENCOUNTER — Ambulatory Visit (INDEPENDENT_AMBULATORY_CARE_PROVIDER_SITE_OTHER): Payer: Medicare Other

## 2021-09-27 ENCOUNTER — Ambulatory Visit (INDEPENDENT_AMBULATORY_CARE_PROVIDER_SITE_OTHER): Payer: Medicare Other | Admitting: Vascular Surgery

## 2021-09-27 ENCOUNTER — Other Ambulatory Visit (INDEPENDENT_AMBULATORY_CARE_PROVIDER_SITE_OTHER): Payer: Self-pay | Admitting: Vascular Surgery

## 2021-09-27 ENCOUNTER — Other Ambulatory Visit: Payer: Self-pay

## 2021-09-27 DIAGNOSIS — Z9889 Other specified postprocedural states: Secondary | ICD-10-CM

## 2021-09-27 DIAGNOSIS — I739 Peripheral vascular disease, unspecified: Secondary | ICD-10-CM | POA: Diagnosis not present

## 2021-10-04 ENCOUNTER — Encounter (INDEPENDENT_AMBULATORY_CARE_PROVIDER_SITE_OTHER): Payer: Self-pay | Admitting: Vascular Surgery

## 2021-10-04 ENCOUNTER — Other Ambulatory Visit: Payer: Self-pay

## 2021-10-04 ENCOUNTER — Ambulatory Visit (INDEPENDENT_AMBULATORY_CARE_PROVIDER_SITE_OTHER): Payer: Medicare Other | Admitting: Vascular Surgery

## 2021-10-04 VITALS — BP 129/75 | HR 68 | Resp 16 | Wt 157.4 lb

## 2021-10-04 DIAGNOSIS — I872 Venous insufficiency (chronic) (peripheral): Secondary | ICD-10-CM | POA: Diagnosis not present

## 2021-10-04 DIAGNOSIS — S85002S Unspecified injury of popliteal artery, left leg, sequela: Secondary | ICD-10-CM

## 2021-10-04 DIAGNOSIS — I70212 Atherosclerosis of native arteries of extremities with intermittent claudication, left leg: Secondary | ICD-10-CM | POA: Diagnosis not present

## 2021-10-04 DIAGNOSIS — E782 Mixed hyperlipidemia: Secondary | ICD-10-CM

## 2021-10-04 DIAGNOSIS — I25118 Atherosclerotic heart disease of native coronary artery with other forms of angina pectoris: Secondary | ICD-10-CM

## 2021-10-05 ENCOUNTER — Encounter (INDEPENDENT_AMBULATORY_CARE_PROVIDER_SITE_OTHER): Payer: Self-pay | Admitting: Vascular Surgery

## 2021-10-05 NOTE — Progress Notes (Signed)
0.76                      +---------+------------------+-----+---------+--------+ +---------+------------------+-----+---------+-------+  Left      Lt  Pressure (mmHg) Index Waveform  Comment  +---------+------------------+-----+---------+-------+  Brachial  143                                         +---------+------------------+-----+---------+-------+  ATA       166                1.15  triphasic          +---------+------------------+-----+---------+-------+  PTA       156                1.08  triphasic          +---------+------------------+-----+---------+-------+  Great Toe 115                0.80                     +---------+------------------+-----+---------+-------+ +-------+-----------+-----------+------------+------------+  ABI/TBI Today's ABI Today's TBI Previous ABI Previous TBI  +-------+-----------+-----------+------------+------------+  Right   1.17        0.76        1.16         1.01          +-------+-----------+-----------+------------+------------+  Left    1.15        0.80        1.35         1.03          +-------+-----------+-----------+------------+------------+ Bilateral ABIs appear essentially unchanged compared to prior study on 05/28/21.  Summary: Bilateral: Bilateral ankle-brachial indexes are within normal range. No evidence of significant lower extremity arterial disease. Bilateral toe-brachial indexes are within normal range.  *See table(s) above for measurements and observations.  Electronically signed by [name] MD on 10/04/2021 at 5:12:49 PM.    Final    VAS [place] LOWER EXTREMITY ARTERIAL DUPLEX  Result Date: 10/04/2021 LOWER EXTREMITY ARTERIAL DUPLEX STUDY Patient Name:  Melanie Browning  Date of Exam:   09/27/2021 Medical Rec #: [id number]      Accession #:    [id number] Date of Birth: September 22, 1941       Patient Gender: F Patient Age:   81 years Exam Location:  [facility] Procedure:      VAS [place] LOWER EXTREMITY ARTERIAL DUPLEX Referring Phys: [name] --------------------------------------------------------------------------------  Indications: Peripheral artery disease.  Vascular Interventions: 10/13/2019:  Left popliteal artery PTA;                         10/14/2019: Left popliteal artery reconstruction;                         02/03/2020: Left popliteal artery angioplasty;                         04/10/2021: Left distal SFA/Popliteal Artery                         thrombectomy/PTA/stent;. Current ABI:            Right=1.17 & Left=1.15 Performing Technologist: Blondell Reveal RT, RDMS, RVT  Examination Guidelines: A complete evaluation includes B-mode  0.76                      +---------+------------------+-----+---------+--------+ +---------+------------------+-----+---------+-------+  Left      Lt  Pressure (mmHg) Index Waveform  Comment  +---------+------------------+-----+---------+-------+  Brachial  143                                         +---------+------------------+-----+---------+-------+  ATA       166                1.15  triphasic          +---------+------------------+-----+---------+-------+  PTA       156                1.08  triphasic          +---------+------------------+-----+---------+-------+  Great Toe 115                0.80                     +---------+------------------+-----+---------+-------+ +-------+-----------+-----------+------------+------------+  ABI/TBI Today's ABI Today's TBI Previous ABI Previous TBI  +-------+-----------+-----------+------------+------------+  Right   1.17        0.76        1.16         1.01          +-------+-----------+-----------+------------+------------+  Left    1.15        0.80        1.35         1.03          +-------+-----------+-----------+------------+------------+ Bilateral ABIs appear essentially unchanged compared to prior study on 05/28/21.  Summary: Bilateral: Bilateral ankle-brachial indexes are within normal range. No evidence of significant lower extremity arterial disease. Bilateral toe-brachial indexes are within normal range.  *See table(s) above for measurements and observations.  Electronically signed by [name] MD on 10/04/2021 at 5:12:49 PM.    Final    VAS [place] LOWER EXTREMITY ARTERIAL DUPLEX  Result Date: 10/04/2021 LOWER EXTREMITY ARTERIAL DUPLEX STUDY Patient Name:  Melanie Browning  Date of Exam:   09/27/2021 Medical Rec #: [id number]      Accession #:    [id number] Date of Birth: September 22, 1941       Patient Gender: F Patient Age:   81 years Exam Location:  [facility] Procedure:      VAS [place] LOWER EXTREMITY ARTERIAL DUPLEX Referring Phys: [name] --------------------------------------------------------------------------------  Indications: Peripheral artery disease.  Vascular Interventions: 10/13/2019:  Left popliteal artery PTA;                         10/14/2019: Left popliteal artery reconstruction;                         02/03/2020: Left popliteal artery angioplasty;                         04/10/2021: Left distal SFA/Popliteal Artery                         thrombectomy/PTA/stent;. Current ABI:            Right=1.17 & Left=1.15 Performing Technologist: Blondell Reveal RT, RDMS, RVT  Examination Guidelines: A complete evaluation includes B-mode  guarding/no peritoneal signs.  Musculoskeletal: M/S 5/5 throughout.  No visible deformity.  Neurologic: CN 2-12 intact. Pain and light touch intact in extremities.  Symmetrical.  Speech is fluent. Motor exam as listed above. Psychiatric: Judgment intact, Mood & affect appropriate for pt's clinical situation. Dermatologic: No rashes or ulcers noted.  No changes consistent with cellulitis.   CBC Lab Results  Component Value Date   WBC 6.5 04/11/2021   HGB 12.2 04/11/2021   HCT 36.6 04/11/2021   MCV 97.9 04/11/2021   PLT 167 04/11/2021    BMET    Component Value Date/Time   BUN 14 04/10/2021 1331   CREATININE 0.62 04/10/2021 1331   GFRNONAA >60 04/10/2021 1331   CrCl cannot be calculated (Patient's most recent lab result is older than the maximum 21 days allowed.).  COAG No results found for: INR, PROTIME  Radiology VAS [place] ABI WITH/WO TBI  Result Date: 10/04/2021  LOWER EXTREMITY DOPPLER STUDY Patient Name:  Melanie Browning  Date of Exam:   09/27/2021 Medical Rec #: [id number]      Accession #:     [id number] Date of Birth: 12-17-1940       Patient Gender: F Patient Age:   54 years Exam Location:  [facility] Procedure:      VAS [place] ABI WITH/WO TBI Referring Phys: [facility] --------------------------------------------------------------------------------  Indications: Peripheral artery disease.  Vascular Interventions: 10/13/2019: Left popliteal artery PTA;                         10/14/2019: Left popliteal artery reconstruction;                         02/03/2020: Left popliteal artery angioplasty;                         04/10/2021: Left distal SFA/Popliteal Artery                         thrombectomy/PTA/stent;. Performing Technologist: Blondell Reveal RT, RDMS, RVT  Examination Guidelines: A complete evaluation includes at minimum, Doppler waveform signals and systolic blood pressure reading at the level of bilateral brachial, anterior tibial, and posterior tibial arteries, when vessel segments are accessible. Bilateral testing is considered an integral part of a complete examination. Photoelectric Plethysmograph (PPG) waveforms and toe systolic pressure readings are included as required and additional duplex testing as needed. Limited examinations for reoccurring indications may be performed as noted.  ABI Findings: +---------+------------------+-----+---------+--------+  Right     Rt Pressure (mmHg) Index Waveform  Comment   +---------+------------------+-----+---------+--------+  Brachial  144                                          +---------+------------------+-----+---------+--------+  ATA       149                1.03  triphasic           +---------+------------------+-----+---------+--------+  PTA       169                1.17  triphasic           +---------+------------------+-----+---------+--------+  Great Toe 109  MRN : [id number]  Melanie Browning is a 81 y.o. (May 30, 1941) female who presents with chief complaint of check my leg.  History of Present Illness:  The patient returns to the office for followup and review status post angiogram with intervention on 04/10/2021.    Procedure: Percutaneous transluminal angioplasty and stent placement left distal superficial femoral artery and popliteal with [place] thrombectomy distal SFA and popliteal artery.   Her claudication symptoms are essentially gone.   No new ulcers or wounds have occurred since the last visit.  She is planning a 27-day trip around the world and in preparation has been using the stairmaster for 20 minutes several times a week she is also walking approximately 2 miles every day.  No significant changes in her varicose veins or venous insufficiency.   There have been no significant changes to the patient's overall health care.   The patient denies amaurosis fugax or recent TIA symptoms. There are no recent neurological changes noted. The patient denies history of DVT, PE or superficial thrombophlebitis. The patient denies recent episodes of angina or shortness of breath.    ABI's Rt=1.17 and Lt=1.15 (previous study Rt=1.16 and Lt=1.35) Duplex ultrasound of the left lower extremity done today demonstrates widely patent arterial system in the popliteal stent is identified with laminar flow no evidence of a hemodynamically significant restenosis  Current Meds  Medication Sig   amitriptyline (ELAVIL) 10 MG tablet Take 1 tablet (10 mg total) by mouth at bedtime.   aspirin EC 81 MG tablet Take 81 mg by mouth every evening. Swallow whole.   atorvastatin (LIPITOR) 40 MG tablet Take 40 mg by mouth every evening.    budesonide (ENTOCORT EC) 3 MG 24 hr capsule Take 3 capsules (9 mg total) by mouth daily. (Patient taking differently: Take 6 mg by mouth daily.)   clopidogrel (PLAVIX) 75 MG tablet TAKE 1 TABLET BY MOUTH DAILY    diphenoxylate-atropine (LOMOTIL) 2.5-0.025 MG tablet TAKE 1 TABLET BY MOUTH 3 TIMES DAILY AS NEEDED FOR DIARRHEA.   diphenoxylate-atropine (LOMOTIL) 2.5-0.025 MG tablet TAKE 1 TABLET BY MOUTH 3 TIMES DAILY AS NEEDED FOR DIARRHEA.   estradiol (ESTRACE) 0.1 MG/GM vaginal cream Insert 1/4 applicator full into vagina at night twice a week (Patient taking differently: Place 1 Applicatorful vaginally 2 (two) times a week. Insert 1/4 applicator full into vagina)   levothyroxine (SYNTHROID) 75 MCG tablet Take 75 mcg by mouth daily before breakfast.    metroNIDAZOLE (METROGEL VAGINAL) 0.75 % vaginal gel Insert 5 grams (37.5 mg of metronidazole) of gel into vagina once daily at bedtime for five days    Past Medical History:  Diagnosis Date   Arthritis    Colitis    D[place]   HPV (human papilloma virus) infection    Hyperlipidemia    Ischemia of left lower extremity    Osteoporosis    Polymyalgia rheumatica (HCC)    Thyroid disease    Vaginal dysplasia     Past Surgical History:  Procedure Laterality Date   ABDOMINAL HYSTERECTOMY     complete   COLONOSCOPY WITH PROPOFOL N/A 10/04/2020   Procedure: COLONOSCOPY WITH PROPOFOL;  Surgeon: Toledo, Boykin Nearing, MD;  Location: [facility];  Service: Gastroenterology;  Laterality: N/A;   colposcobpy     COLPOSCOPY     CORONARY STENT INTERVENTION N/A 11/15/2020   Procedure: CORONARY STENT INTERVENTION;  Surgeon: Alwyn Pea, MD;  Location: [facility];  0.76                      +---------+------------------+-----+---------+--------+ +---------+------------------+-----+---------+-------+  Left      Lt  Pressure (mmHg) Index Waveform  Comment  +---------+------------------+-----+---------+-------+  Brachial  143                                         +---------+------------------+-----+---------+-------+  ATA       166                1.15  triphasic          +---------+------------------+-----+---------+-------+  PTA       156                1.08  triphasic          +---------+------------------+-----+---------+-------+  Great Toe 115                0.80                     +---------+------------------+-----+---------+-------+ +-------+-----------+-----------+------------+------------+  ABI/TBI Today's ABI Today's TBI Previous ABI Previous TBI  +-------+-----------+-----------+------------+------------+  Right   1.17        0.76        1.16         1.01          +-------+-----------+-----------+------------+------------+  Left    1.15        0.80        1.35         1.03          +-------+-----------+-----------+------------+------------+ Bilateral ABIs appear essentially unchanged compared to prior study on 05/28/21.  Summary: Bilateral: Bilateral ankle-brachial indexes are within normal range. No evidence of significant lower extremity arterial disease. Bilateral toe-brachial indexes are within normal range.  *See table(s) above for measurements and observations.  Electronically signed by [name] MD on 10/04/2021 at 5:12:49 PM.    Final    VAS [place] LOWER EXTREMITY ARTERIAL DUPLEX  Result Date: 10/04/2021 LOWER EXTREMITY ARTERIAL DUPLEX STUDY Patient Name:  Melanie Browning  Date of Exam:   09/27/2021 Medical Rec #: [id number]      Accession #:    [id number] Date of Birth: September 22, 1941       Patient Gender: F Patient Age:   81 years Exam Location:  [facility] Procedure:      VAS [place] LOWER EXTREMITY ARTERIAL DUPLEX Referring Phys: [name] --------------------------------------------------------------------------------  Indications: Peripheral artery disease.  Vascular Interventions: 10/13/2019:  Left popliteal artery PTA;                         10/14/2019: Left popliteal artery reconstruction;                         02/03/2020: Left popliteal artery angioplasty;                         04/10/2021: Left distal SFA/Popliteal Artery                         thrombectomy/PTA/stent;. Current ABI:            Right=1.17 & Left=1.15 Performing Technologist: Blondell Reveal RT, RDMS, RVT  Examination Guidelines: A complete evaluation includes B-mode  guarding/no peritoneal signs.  Musculoskeletal: M/S 5/5 throughout.  No visible deformity.  Neurologic: CN 2-12 intact. Pain and light touch intact in extremities.  Symmetrical.  Speech is fluent. Motor exam as listed above. Psychiatric: Judgment intact, Mood & affect appropriate for pt's clinical situation. Dermatologic: No rashes or ulcers noted.  No changes consistent with cellulitis.   CBC Lab Results  Component Value Date   WBC 6.5 04/11/2021   HGB 12.2 04/11/2021   HCT 36.6 04/11/2021   MCV 97.9 04/11/2021   PLT 167 04/11/2021    BMET    Component Value Date/Time   BUN 14 04/10/2021 1331   CREATININE 0.62 04/10/2021 1331   GFRNONAA >60 04/10/2021 1331   CrCl cannot be calculated (Patient's most recent lab result is older than the maximum 21 days allowed.).  COAG No results found for: INR, PROTIME  Radiology VAS [place] ABI WITH/WO TBI  Result Date: 10/04/2021  LOWER EXTREMITY DOPPLER STUDY Patient Name:  Melanie Browning  Date of Exam:   09/27/2021 Medical Rec #: [id number]      Accession #:     [id number] Date of Birth: 12-17-1940       Patient Gender: F Patient Age:   54 years Exam Location:  [facility] Procedure:      VAS [place] ABI WITH/WO TBI Referring Phys: [facility] --------------------------------------------------------------------------------  Indications: Peripheral artery disease.  Vascular Interventions: 10/13/2019: Left popliteal artery PTA;                         10/14/2019: Left popliteal artery reconstruction;                         02/03/2020: Left popliteal artery angioplasty;                         04/10/2021: Left distal SFA/Popliteal Artery                         thrombectomy/PTA/stent;. Performing Technologist: Blondell Reveal RT, RDMS, RVT  Examination Guidelines: A complete evaluation includes at minimum, Doppler waveform signals and systolic blood pressure reading at the level of bilateral brachial, anterior tibial, and posterior tibial arteries, when vessel segments are accessible. Bilateral testing is considered an integral part of a complete examination. Photoelectric Plethysmograph (PPG) waveforms and toe systolic pressure readings are included as required and additional duplex testing as needed. Limited examinations for reoccurring indications may be performed as noted.  ABI Findings: +---------+------------------+-----+---------+--------+  Right     Rt Pressure (mmHg) Index Waveform  Comment   +---------+------------------+-----+---------+--------+  Brachial  144                                          +---------+------------------+-----+---------+--------+  ATA       149                1.03  triphasic           +---------+------------------+-----+---------+--------+  PTA       169                1.17  triphasic           +---------+------------------+-----+---------+--------+  Great Toe 109

## 2021-10-09 ENCOUNTER — Other Ambulatory Visit (INDEPENDENT_AMBULATORY_CARE_PROVIDER_SITE_OTHER): Payer: Self-pay | Admitting: Vascular Surgery

## 2021-10-17 ENCOUNTER — Ambulatory Visit: Payer: Medicare Other

## 2021-10-19 ENCOUNTER — Other Ambulatory Visit: Payer: Self-pay | Admitting: Gastroenterology

## 2021-11-19 ENCOUNTER — Ambulatory Visit (INDEPENDENT_AMBULATORY_CARE_PROVIDER_SITE_OTHER): Payer: Medicare Other | Admitting: Vascular Surgery

## 2021-11-19 ENCOUNTER — Encounter (INDEPENDENT_AMBULATORY_CARE_PROVIDER_SITE_OTHER): Payer: Self-pay | Admitting: Vascular Surgery

## 2021-11-19 ENCOUNTER — Ambulatory Visit (INDEPENDENT_AMBULATORY_CARE_PROVIDER_SITE_OTHER): Payer: Medicare Other

## 2021-11-19 ENCOUNTER — Other Ambulatory Visit: Payer: Self-pay

## 2021-11-19 ENCOUNTER — Other Ambulatory Visit (INDEPENDENT_AMBULATORY_CARE_PROVIDER_SITE_OTHER): Payer: Self-pay | Admitting: Vascular Surgery

## 2021-11-19 VITALS — BP 156/76 | HR 79 | Resp 17 | Ht 68.0 in | Wt 155.0 lb

## 2021-11-19 DIAGNOSIS — I739 Peripheral vascular disease, unspecified: Secondary | ICD-10-CM | POA: Diagnosis not present

## 2021-11-19 DIAGNOSIS — S85002S Unspecified injury of popliteal artery, left leg, sequela: Secondary | ICD-10-CM

## 2021-11-19 DIAGNOSIS — I70223 Atherosclerosis of native arteries of extremities with rest pain, bilateral legs: Secondary | ICD-10-CM

## 2021-11-19 DIAGNOSIS — M79605 Pain in left leg: Secondary | ICD-10-CM

## 2021-11-19 DIAGNOSIS — Z9889 Other specified postprocedural states: Secondary | ICD-10-CM

## 2021-11-19 DIAGNOSIS — E782 Mixed hyperlipidemia: Secondary | ICD-10-CM

## 2021-11-19 DIAGNOSIS — E039 Hypothyroidism, unspecified: Secondary | ICD-10-CM

## 2021-11-19 DIAGNOSIS — I25118 Atherosclerotic heart disease of native coronary artery with other forms of angina pectoris: Secondary | ICD-10-CM

## 2021-11-20 ENCOUNTER — Encounter
Admission: RE | Disposition: A | Discharge status: Home / Self Care | Payer: Self-pay | Source: Ambulatory Visit | Attending: Vascular Surgery

## 2021-11-20 ENCOUNTER — Encounter (INDEPENDENT_AMBULATORY_CARE_PROVIDER_SITE_OTHER): Payer: Self-pay | Admitting: Vascular Surgery

## 2021-11-20 ENCOUNTER — Other Ambulatory Visit (INDEPENDENT_AMBULATORY_CARE_PROVIDER_SITE_OTHER): Payer: Self-pay | Admitting: Nurse Practitioner

## 2021-11-20 ENCOUNTER — Telehealth (INDEPENDENT_AMBULATORY_CARE_PROVIDER_SITE_OTHER): Payer: Self-pay

## 2021-11-20 ENCOUNTER — Other Ambulatory Visit: Payer: Self-pay

## 2021-11-20 ENCOUNTER — Encounter: Payer: Self-pay | Admitting: Vascular Surgery

## 2021-11-20 ENCOUNTER — Ambulatory Visit
Admission: RE | Admit: 2021-11-20 | Discharge: 2021-11-20 | Disposition: A | Discharge status: Home / Self Care | Payer: Medicare Other | Source: Ambulatory Visit | Attending: Vascular Surgery | Admitting: Vascular Surgery

## 2021-11-20 DIAGNOSIS — E039 Hypothyroidism, unspecified: Secondary | ICD-10-CM | POA: Diagnosis not present

## 2021-11-20 DIAGNOSIS — Z79890 Hormone replacement therapy (postmenopausal): Secondary | ICD-10-CM | POA: Insufficient documentation

## 2021-11-20 DIAGNOSIS — I70219 Atherosclerosis of native arteries of extremities with intermittent claudication, unspecified extremity: Secondary | ICD-10-CM

## 2021-11-20 DIAGNOSIS — I70222 Atherosclerosis of native arteries of extremities with rest pain, left leg: Secondary | ICD-10-CM | POA: Diagnosis not present

## 2021-11-20 DIAGNOSIS — I25118 Atherosclerotic heart disease of native coronary artery with other forms of angina pectoris: Secondary | ICD-10-CM | POA: Insufficient documentation

## 2021-11-20 DIAGNOSIS — I70223 Atherosclerosis of native arteries of extremities with rest pain, bilateral legs: Secondary | ICD-10-CM

## 2021-11-20 DIAGNOSIS — I70229 Atherosclerosis of native arteries of extremities with rest pain, unspecified extremity: Secondary | ICD-10-CM | POA: Insufficient documentation

## 2021-11-20 DIAGNOSIS — E782 Mixed hyperlipidemia: Secondary | ICD-10-CM | POA: Diagnosis not present

## 2021-11-20 HISTORY — PX: LOWER EXTREMITY ANGIOGRAPHY: CATH118251

## 2021-11-20 LAB — CREATININE, SERUM
Creatinine, Ser: 0.63 mg/dL (ref 0.44–1.00)
GFR, Estimated: >60 mL/min (ref >60)

## 2021-11-20 LAB — BUN: BUN: 18 mg/dL (ref 8–23)

## 2021-11-20 SURGERY — LOWER EXTREMITY ANGIOGRAPHY
Anesthesia: Moderate Sedation | Site: Leg Lower | Laterality: Left

## 2021-11-20 MED ORDER — HEPARIN SODIUM (PORCINE) 1000 UNIT/ML IJ SOLN
INTRAMUSCULAR | Status: DC | PRN
  Administered 2021-11-20: 5000 [IU] via INTRAVENOUS

## 2021-11-20 MED ORDER — CEFAZOLIN SODIUM-DEXTROSE 2-4 GM/100ML-% IV SOLN
2.0000 g | Freq: Once | INTRAVENOUS | Status: AC
  Administered 2021-11-20: 2 g via INTRAVENOUS

## 2021-11-20 MED ORDER — MORPHINE SULFATE (PF) 4 MG/ML IV SOLN: 2.0000 mg | INTRAVENOUS | Status: DC | PRN

## 2021-11-20 MED ORDER — IODIXANOL 320 MG/ML IV SOLN
INTRAVENOUS | Status: DC | PRN
Start: 2021-11-20 — End: 2021-11-20
  Administered 2021-11-20: 65 mL

## 2021-11-20 MED ORDER — ONDANSETRON HCL 4 MG/2ML IJ SOLN: 4.0000 mg | Freq: Four times a day (QID) | INTRAMUSCULAR | Status: DC | PRN

## 2021-11-20 MED ORDER — FENTANYL CITRATE (PF) 100 MCG/2ML IJ SOLN
INTRAMUSCULAR | Status: DC | PRN
  Administered 2021-11-20 (×2): 50 ug via INTRAVENOUS

## 2021-11-20 MED ORDER — FENTANYL CITRATE PF 50 MCG/ML IJ SOSY
PREFILLED_SYRINGE | INTRAMUSCULAR | Status: AC
  Filled 2021-11-20: qty 2

## 2021-11-20 MED ORDER — MIDAZOLAM HCL 2 MG/2ML IJ SOLN
INTRAMUSCULAR | Status: DC | PRN
  Administered 2021-11-20: 2 mg via INTRAVENOUS
  Administered 2021-11-20: 1 mg via INTRAVENOUS

## 2021-11-20 MED ORDER — METHYLPREDNISOLONE SODIUM SUCC 125 MG IJ SOLR: 125.0000 mg | Freq: Once | INTRAMUSCULAR | Status: DC | PRN

## 2021-11-20 MED ORDER — HEPARIN SODIUM (PORCINE) 1000 UNIT/ML IJ SOLN
INTRAMUSCULAR | Status: AC
  Filled 2021-11-20: qty 10

## 2021-11-20 MED ORDER — SODIUM CHLORIDE 0.9 % IV SOLN: INTRAVENOUS | Status: DC

## 2021-11-20 MED ORDER — DIPHENHYDRAMINE HCL 50 MG/ML IJ SOLN: 50.0000 mg | Freq: Once | INTRAMUSCULAR | Status: DC | PRN

## 2021-11-20 MED ORDER — MIDAZOLAM HCL 5 MG/5ML IJ SOLN
INTRAMUSCULAR | Status: AC
  Filled 2021-11-20: qty 5

## 2021-11-20 MED ORDER — HYDROMORPHONE HCL 1 MG/ML IJ SOLN: 1.0000 mg | Freq: Once | INTRAMUSCULAR | Status: DC | PRN

## 2021-11-20 MED ORDER — MIDAZOLAM HCL 2 MG/ML PO SYRP: 8.0000 mg | ORAL_SOLUTION | Freq: Once | ORAL | Status: DC | PRN

## 2021-11-20 MED ORDER — NITROGLYCERIN 1 MG/10 ML FOR IR/CATH LAB
INTRA_ARTERIAL | Status: DC | PRN
  Administered 2021-11-20: 200 ug via INTRA_ARTERIAL
  Administered 2021-11-20: 300 ug via INTRA_ARTERIAL

## 2021-11-20 MED ORDER — FAMOTIDINE 20 MG PO TABS: 40.0000 mg | ORAL_TABLET | Freq: Once | ORAL | Status: DC | PRN

## 2021-11-20 SURGICAL SUPPLY — 21 items
BALLN LUTONIX 5X150X130 (BALLOONS) ×2
BALLN LUTONIX DCB 5X100X130 (BALLOONS) ×2
BALLN ULTRASCOR 014 2.5X40X150 (BALLOONS) ×2
BALLOON LUTONIX 5X150X130 (BALLOONS) IMPLANT
BALLOON LUTONIX DCB 5X100X130 (BALLOONS) IMPLANT
BALLOON ULTRSCR 014 2.5X40X150 (BALLOONS) IMPLANT
CATH ANGIO 5F PIGTAIL 65CM (CATHETERS) ×1 IMPLANT
CATH VERT 5FR 125CM (CATHETERS) ×1 IMPLANT
DEVICE STARCLOSE SE CLOSURE (Vascular Products) ×1 IMPLANT
DEVICE TORQUE (MISCELLANEOUS) ×1 IMPLANT
GLIDEWIRE ADV .035X260CM (WIRE) ×1 IMPLANT
KIT ENCORE 26 ADVANTAGE (KITS) ×1 IMPLANT
PACK ANGIOGRAPHY (CUSTOM PROCEDURE TRAY) ×2 IMPLANT
SHEATH BRITE TIP 5FRX11 (SHEATH) ×1 IMPLANT
SHEATH RAABE 6FR (SHEATH) ×1 IMPLANT
STENT LIFESTENT 5F 6X150X135 (Permanent Stent) ×1 IMPLANT
SYR MEDRAD MARK 7 150ML (SYRINGE) ×1 IMPLANT
TUBING CONTRAST HIGH PRESS 72 (TUBING) ×1 IMPLANT
WIRE GUIDERIGHT .035X150 (WIRE) ×1 IMPLANT
WIRE NITINOL .018 (WIRE) ×1 IMPLANT
WIRE RUNTHROUGH .014X300CM (WIRE) ×1 IMPLANT

## 2021-11-20 NOTE — Interval H&P Note (Signed)
History and Physical Interval Note:  11/20/2021 2:49 PM  Melanie Browning  has presented today for surgery, with the diagnosis of LLE Angio   BARD    ASO w claudication.  The various methods of treatment have been discussed with the patient and family. After consideration of risks, benefits and other options for treatment, the patient has consented to  Procedure(s): Lower Extremity Angiography (Left) as a surgical intervention.  The patient's history has been reviewed, patient examined, no change in status, stable for surgery.  I have reviewed the patient's chart and labs.  Questions were answered to the patient's satisfaction.     [name]

## 2021-11-20 NOTE — Op Note (Signed)
[facility]  Percutaneous Study/Intervention Procedural Note   Date of Surgery: 11/20/2021  Surgeon:  [name]  Pre-operative Diagnosis: Atherosclerotic occlusive disease bilateral lower extremities with rest pain of the left lower extremity  Post-operative diagnosis:  Same  Procedure(s) Performed:             1.  Introduction catheter into left lower extremity 3rd order catheter placement               2.    Contrast injection left lower extremity for distal runoff             3.  Percutaneous transluminal angioplasty and stent placement left superficial femoral and popliteal arteries             4.  Percutaneous transluminal angioplasty left tibioperoneal trunk and posterior tibial             5.  Star close closure right common femoral arteriotomy  Anesthesia: Conscious sedation was administered under my direct supervision by the interventional radiology RN. IV Versed plus fentanyl were utilized. Continuous ECG, pulse oximetry and blood pressure was monitored throughout the entire procedure.  Conscious sedation was for a total of 1 hour 17 minutes.  Sheath: 6 [place] Rabie right common femoral retrograde  Contrast: 65 cc  Fluoroscopy Time: 7.3 minutes  Indications:  Melanie Browning presents with increasing pain of the left lower extremity.  Noninvasive studies demonstrated recurrence of a lesion just proximal to her previously placed stent.  This suggests the patient is having limb threatening ischemia and is at risk for thrombosis of her stent. The risks and benefits are reviewed all questions answered patient agrees to proceed.  Procedure: Melanie Browning is a 81 y.o. y.o. female who was identified and appropriate procedural time out was performed.  The patient was then placed supine on the table and prepped and draped in the usual sterile fashion.    Ultrasound was placed in the sterile sleeve and the right groin was evaluated the right common femoral  artery was echolucent and pulsatile indicating patency.  Image was recorded for the permanent record and under real-time visualization a microneedle was inserted into the common femoral artery followed by the microwire and then the micro-sheath.  A J-wire was then advanced through the micro-sheath and a  5 [place] sheath was then inserted over a J-wire. J-wire was then advanced and a 5 French pigtail catheter was positioned at the level of T12.  AP projection of the aorta was then obtained. Pigtail catheter was repositioned to above the bifurcation and a RAO view of the pelvis was obtained.  Subsequently a pigtail catheter with the stiff angle Glidewire was used to cross the aortic bifurcation the catheter wire were advanced down into the left distal external iliac artery. Oblique view of the femoral bifurcation was then obtained and subsequently the wire was reintroduced and the pigtail catheter negotiated into the SFA representing third order catheter placement. Distal runoff was then performed.  Diagnostic interpretation: The abdominal aorta is opacified with a bolus injection contrast.  Single renal arteries are noted they are widely patent.  The aorta aortic bifurcation bilateral common internal and external iliac arteries are all widely patent.  The left common femoral and profunda femoris are widely patent.  The left superficial femoral artery is widely patent with minimal atherosclerotic changes down to its midportion.  Just below the midportion proximal to Hunter's canal there is a string sign noted greater than 95% stenosis  over a distance of approximately 15 mm.  Several centimeters below this there is a greater than 70% stenosis at the leading edge of the previously placed stent.  In association with this lesion there are 2 more recurrent lesions in the stent itself of approximately 60 to 70%.  The distal half of the stent is widely patent as is the distal margin.  The lower half of the popliteal  artery is widely patent.  Anterior tibial is widely patent throughout its course and fills the dorsalis pedis.  Tibioperoneal trunk demonstrates a 70 to 80% stenosis that extends into the posterior tibial over a distance of approximately 20 to 25 mm.  Distal to this lesion the posterior tibial is widely patent and fills the plantar vessels.  The peroneal is patent from its origin down to the ankle. ° °5000 units of heparin was then given and allowed to circulate and a 6 French Rabie sheath was advanced up and over the bifurcation and positioned in the femoral artery ° °Straight catheter and stiff angle Glidewire were then negotiated down into the distal popliteal.  5 mm x 100 mm Lutonix balloon was used to angioplasty the SFA and proximal popliteal.  The inflation was for 1 minute at 8 atm. Follow-up imaging demonstrated 50% residual stenosis.  I therefore elected to stent this lesion.  In order to cover all of the above-noted stenoses I selected a 6 mm x 150 mm life stent deployed this overlapping the previous stent proximally and then postdilated it with a 5 mm x 150 mm Lutonix drug-eluting balloon inflated to 8 atm for 1 minute.  Follow-up imaging demonstrated less than 10% residual stenosis from the mid SFA down to the distal popliteal.  I therefore addressed the tibial lesions. ° °Using the Kumpe catheter and the advantage wire and negotiated the wire and catheter into the posterior tibial and then exchanged the wire for a 0.014 run-through wire.  A 2.5 mm x 40 mm ultra score balloon was then advanced across the lesion from the tibioperoneal trunk into the posterior tibial.  Inflation was to 10 atm for 1 minute.  Follow-up imaging demonstrated less than 10% residual stenosis with preservation of distal runoff.  Patient now has three-vessel runoff.  And I elected to terminate the case. ° °It should be noted that following the stent placement I did give 300 mcg of nitroglycerin intra-arterially and then later in  the case an additional 200 mcg of nitroglycerin. ° °After review of these images the sheath is pulled into the right external iliac oblique of the common femoral is obtained and a Star close device deployed. There no immediate Complications. ° °Findings:   °The abdominal aorta is opacified with a bolus injection contrast.  Single renal arteries are noted they are widely patent.  The aorta aortic bifurcation bilateral common internal and external iliac arteries are all widely patent. ° °The left common femoral and profunda femoris are widely patent.  The left superficial femoral artery is widely patent with minimal atherosclerotic changes down to its midportion.  Just below the midportion proximal to Hunter's canal there is a string sign noted greater than 95% stenosis over a distance of approximately 15 mm.  Several centimeters below this there is a greater than 70% stenosis at the leading edge of the previously placed stent.  In association with this lesion there are 2 more recurrent lesions in the stent itself of approximately 60 to 70%.  The distal half of the stent is widely   patent as is the distal margin.  The lower half of the popliteal artery is widely patent.  Anterior tibial is widely patent throughout its course and fills the dorsalis pedis.  Tibioperoneal trunk demonstrates a 70 to 80% stenosis that extends into the posterior tibial over a distance of approximately 20 to 25 mm.  Distal to this lesion the posterior tibial is widely patent and fills the plantar vessels.  The peroneal is patent from its origin down to the ankle. ° °Following angioplasty the SFA demonstrates greater than 50% residual stenosis at several locations within the SFA necessitating stent placement.  Following stent placement and post balloon angioplasty with a 5 mm Lutonix balloon there is now less than 10% residual stenosis throughout the SFA and popliteal.  Following inflation with a 2.5 mm ultra score balloon there is less than  10% residual stenosis within the TP trunk and posterior tibial.  There is preservation of three-vessel runoff to the foot. ° °Summary: Successful recanalization left lower extremity for limb salvage °                         ° °Disposition: Patient was taken to the recovery room in stable condition having tolerated the procedure well. ° °Gregory Schnier °11/20/2021,5:34 PM  °

## 2021-11-20 NOTE — Telephone Encounter (Signed)
Spoke with the patient to let her know to be at the [facility] today at 2:30 pm for her left leg angio with Dr. Delana Meyer. Patient was given pre-procedure instructions and stated she understood and wrote it down. Patient is eating a small protein bar, this was before 8:00 am.

## 2021-11-20 NOTE — Progress Notes (Signed)
MRN : [id number]  Melanie Browning is a 81 y.o. (1941/01/17) female who presents with chief complaint of pain in left leg.  History of Present Illness:   Patient is seen sooner than expected.  She has had the abrupt onset of left lower extremity pain.  She now is experiencing mild rest pain symptoms and short distance claudication.  She has a history of multiple interventions secondary to a popliteal artery injury.  She has a remote history of open arterial reconstruction.  Duplex ultrasound is obtained emergently which demonstrates a critical stenosis at the proximal edge of the previously placed stent.  Current Meds  Medication Sig   aspirin EC 81 MG tablet Take 81 mg by mouth every evening. Swallow whole.   atorvastatin (LIPITOR) 40 MG tablet Take 40 mg by mouth every evening.    budesonide (ENTOCORT EC) 3 MG 24 hr capsule Take 9 mg by mouth daily.   clopidogrel (PLAVIX) 75 MG tablet TAKE 1 TABLET BY MOUTH DAILY   diphenoxylate-atropine (LOMOTIL) 2.5-0.025 MG tablet TAKE 1 TABLET BY MOUTH 3 TIMES DAILY AS NEEDED FOR DIARRHEA.   levothyroxine (SYNTHROID) 75 MCG tablet Take 75 mcg by mouth daily before breakfast.    losartan (COZAAR) 25 MG tablet Take 25 mg by mouth daily.   nitroGLYCERIN (NITROSTAT) 0.4 MG SL tablet Place under the tongue.    Past Medical History:  Diagnosis Date   Arthritis    Colitis    [place]   HPV (human papilloma virus) infection    Hyperlipidemia    Ischemia of left lower extremity    Osteoporosis    Polymyalgia rheumatica (HCC)    Thyroid disease    Vaginal dysplasia     Past Surgical History:  Procedure Laterality Date   ABDOMINAL HYSTERECTOMY     complete   COLONOSCOPY WITH PROPOFOL N/A 10/04/2020   Procedure: COLONOSCOPY WITH PROPOFOL;  Surgeon: Toledo, Boykin Nearing, MD;  Location: [facility];  Service: Gastroenterology;  Laterality: N/A;   colposcobpy     COLPOSCOPY     CORONARY STENT INTERVENTION N/A  11/15/2020   Procedure: CORONARY STENT INTERVENTION;  Surgeon: Alwyn Pea, MD;  Location: [facility];  Service: Cardiovascular;  Laterality: N/A;   knee athroscopy Left    LAPAROSCOPIC HYSTERECTOMY     LEFT HEART CATH AND CORONARY ANGIOGRAPHY N/A 11/15/2020   Procedure: LEFT HEART CATH AND CORONARY ANGIOGRAPHY;  Surgeon: Alwyn Pea, MD;  Location: [facility];  Service: Cardiovascular;  Laterality: N/A;   LOWER EXTREMITY ANGIOGRAPHY Left 09/05/2020   Procedure: LOWER EXTREMITY ANGIOGRAPHY;  Surgeon: Renford Dills, MD;  Location: [facility];  Service: Cardiovascular;  Laterality: Left;   LOWER EXTREMITY ANGIOGRAPHY Left 04/10/2021   Procedure: LOWER EXTREMITY ANGIOGRAPHY;  Surgeon: Renford Dills, MD;  Location: [facility];  Service: Cardiovascular;  Laterality: Left;    Social History Social History   Tobacco Use   Smoking status: Never   Smokeless tobacco: Never  Vaping Use   Vaping Use: Never used  Substance Use Topics   Alcohol use: Not Currently    Alcohol/week: 3.0 standard drinks    Types: 3 Glasses of wine per week    Comment: 3 glasses of wine per week    Drug use: Never    Family History Family History  Problem Relation Age of Onset   Cancer Mother    Pancreatic cancer Mother  Heart disease Father     Allergies  Allergen Reactions   Lactose Other (See Comments)    Gi upset and diarrhea  Other reaction(s): Other (See Comments) Gi upset and diarrhea Gi upset and diarrhea Gi upset and diarrhea     REVIEW OF SYSTEMS (Negative unless checked)  Constitutional: [] Weight loss  [] Fever  [] Chills Cardiac: [] Chest pain   [] Chest pressure   [] Palpitations   [] Shortness of breath when laying flat   [] Shortness of breath with exertion. Vascular:  [x] Pain in legs with walking   [x] Pain in legs at rest  [] History of DVT   [] Phlebitis   [] Swelling in legs   [] Varicose veins   [] Non-healing ulcers Pulmonary:    [] Uses home oxygen   [] Productive cough   [] Hemoptysis   [] Wheeze  [] COPD   [] Asthma Neurologic:  [] Dizziness   [] Seizures   [] History of stroke   [] History of TIA  [] Aphasia   [] Vissual changes   [] Weakness or numbness in arm   [] Weakness or numbness in leg Musculoskeletal:   [] Joint swelling   [] Joint pain   [] Low back pain Hematologic:  [] Easy bruising  [] Easy bleeding   [] Hypercoagulable state   [] Anemic Gastrointestinal:  [] Diarrhea   [] Vomiting  [] Gastroesophageal reflux/heartburn   [] Difficulty swallowing. Genitourinary:  [] Chronic kidney disease   [] Difficult urination  [] Frequent urination   [] Blood in urine Skin:  [] Rashes   [] Ulcers  Psychological:  [] History of anxiety   []  History of major depression.  Physical Examination  Vitals:   11/19/21 1604  BP: (!) 156/76  Pulse: 79  Resp: 17  Weight: 155 lb (70.3 kg)  Height: 5\' 8"  (1.727 m)   Body mass index is 23.57 kg/m. Gen: WD/WN, NAD Head: [place]/AT, No temporalis wasting.  Ear/Nose/Throat: Hearing grossly intact, nares w/o erythema or drainage Eyes: PER, EOMI, sclera nonicteric.  Neck: Supple, no masses.  No bruit or JVD.  Pulmonary:  Good air movement, no audible wheezing, no use of accessory muscles.  Cardiac: RRR, normal S1, S2, no Murmurs. Vascular:   Vessel Right Left  Radial Palpable Palpable  PT Palpable Not palpable  DP Palpable Not palpable  Gastrointestinal: soft, non-distended. No guarding/no peritoneal signs.  Musculoskeletal: M/S 5/5 throughout.  No visible deformity.  Neurologic: CN 2-12 intact. Pain and light touch intact in extremities.  Symmetrical.  Speech is fluent. Motor exam as listed above. Psychiatric: Judgment intact, Mood & affect appropriate for pt's clinical situation. Dermatologic: No rashes or ulcers noted.  No changes consistent with cellulitis.   CBC Lab Results  Component Value Date   WBC 6.5 04/11/2021   HGB 12.2 04/11/2021   HCT 36.6 04/11/2021   MCV 97.9 04/11/2021   PLT 167  04/11/2021    BMET    Component Value Date/Time   BUN 14 04/10/2021 1331   CREATININE 0.62 04/10/2021 1331   GFRNONAA >60 04/10/2021 1331   CrCl cannot be calculated (Patient's most recent lab result is older than the maximum 21 days allowed.).  COAG No results found for: INR, PROTIME  Radiology No results found.   Assessment/Plan 1. 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  This represents a limb threatening ischemia and places the patient at the risk for left limb loss.  Patient should undergo angiography of the lower extremities  with the hope for intervention for limb salvage.  The risks and benefits as well as the alternative therapies was discussed in detail with the patient.  All questions were answered.  Patient agrees to proceed with left lower extremity angiography.  Given the patient's rather abrupt change in the severity of her symptoms I believe this is an emergency and will add her on tomorrow for angio intervention.  The patient will follow up with me in the office after the procedure.       2. Coronary artery disease of native artery of native heart with stable angina pectoris (HCC) Continue cardiac and antihypertensive medications as already ordered and reviewed, no changes at this time.  Continue statin as ordered and reviewed, no changes at this time  Nitrates PRN for chest pain   3. Popliteal artery injury, left, sequela Recommend:  The patient has evidence of severe atherosclerotic changes of both lower extremities with rest pain that is associated with preulcerative changes and impending tissue loss of the left foot.  This represents a limb threatening ischemia and places the patient at the risk for left limb  loss.  Patient should undergo angiography of the lower extremities with the hope for intervention for limb salvage.  The risks and benefits as well as the alternative therapies was discussed in detail with the patient.  All questions were answered.  Patient agrees to proceed with left lower extremity angiography.  Given the patient's rather abrupt change in the severity of her symptoms I believe this is an emergency and will add her on tomorrow for angio intervention.  The patient will follow up with me in the office after the procedure.   4. Mixed hyperlipidemia Continue statin as ordered and reviewed, no changes at this time   5. Hypothyroidism, unspecified type Continue hormone replacement as ordered and reviewed, no changes at this time     Levora Dredge, MD  11/20/2021 8:08 AM

## 2021-11-20 NOTE — H&P (View-Only) (Signed)
MRN : [id number]  Melanie Browning is a 81 y.o. (1941/01/17) female who presents with chief complaint of pain in left leg.  History of Present Illness:   Patient is seen sooner than expected.  She has had the abrupt onset of left lower extremity pain.  She now is experiencing mild rest pain symptoms and short distance claudication.  She has a history of multiple interventions secondary to a popliteal artery injury.  She has a remote history of open arterial reconstruction.  Duplex ultrasound is obtained emergently which demonstrates a critical stenosis at the proximal edge of the previously placed stent.  Current Meds  Medication Sig   aspirin EC 81 MG tablet Take 81 mg by mouth every evening. Swallow whole.   atorvastatin (LIPITOR) 40 MG tablet Take 40 mg by mouth every evening.    budesonide (ENTOCORT EC) 3 MG 24 hr capsule Take 9 mg by mouth daily.   clopidogrel (PLAVIX) 75 MG tablet TAKE 1 TABLET BY MOUTH DAILY   diphenoxylate-atropine (LOMOTIL) 2.5-0.025 MG tablet TAKE 1 TABLET BY MOUTH 3 TIMES DAILY AS NEEDED FOR DIARRHEA.   levothyroxine (SYNTHROID) 75 MCG tablet Take 75 mcg by mouth daily before breakfast.    losartan (COZAAR) 25 MG tablet Take 25 mg by mouth daily.   nitroGLYCERIN (NITROSTAT) 0.4 MG SL tablet Place under the tongue.    Past Medical History:  Diagnosis Date   Arthritis    Colitis    [place]   HPV (human papilloma virus) infection    Hyperlipidemia    Ischemia of left lower extremity    Osteoporosis    Polymyalgia rheumatica (HCC)    Thyroid disease    Vaginal dysplasia     Past Surgical History:  Procedure Laterality Date   ABDOMINAL HYSTERECTOMY     complete   COLONOSCOPY WITH PROPOFOL N/A 10/04/2020   Procedure: COLONOSCOPY WITH PROPOFOL;  Surgeon: Toledo, Boykin Nearing, MD;  Location: [facility];  Service: Gastroenterology;  Laterality: N/A;   colposcobpy     COLPOSCOPY     CORONARY STENT INTERVENTION N/A  11/15/2020   Procedure: CORONARY STENT INTERVENTION;  Surgeon: Alwyn Pea, MD;  Location: [facility];  Service: Cardiovascular;  Laterality: N/A;   knee athroscopy Left    LAPAROSCOPIC HYSTERECTOMY     LEFT HEART CATH AND CORONARY ANGIOGRAPHY N/A 11/15/2020   Procedure: LEFT HEART CATH AND CORONARY ANGIOGRAPHY;  Surgeon: Alwyn Pea, MD;  Location: [facility];  Service: Cardiovascular;  Laterality: N/A;   LOWER EXTREMITY ANGIOGRAPHY Left 09/05/2020   Procedure: LOWER EXTREMITY ANGIOGRAPHY;  Surgeon: Renford Dills, MD;  Location: [facility];  Service: Cardiovascular;  Laterality: Left;   LOWER EXTREMITY ANGIOGRAPHY Left 04/10/2021   Procedure: LOWER EXTREMITY ANGIOGRAPHY;  Surgeon: Renford Dills, MD;  Location: [facility];  Service: Cardiovascular;  Laterality: Left;    Social History Social History   Tobacco Use   Smoking status: Never   Smokeless tobacco: Never  Vaping Use   Vaping Use: Never used  Substance Use Topics   Alcohol use: Not Currently    Alcohol/week: 3.0 standard drinks    Types: 3 Glasses of wine per week    Comment: 3 glasses of wine per week    Drug use: Never    Family History Family History  Problem Relation Age of Onset   Cancer Mother    Pancreatic cancer Mother  Heart disease Father     Allergies  Allergen Reactions   Lactose Other (See Comments)    Gi upset and diarrhea  Other reaction(s): Other (See Comments) Gi upset and diarrhea Gi upset and diarrhea Gi upset and diarrhea     REVIEW OF SYSTEMS (Negative unless checked)  Constitutional: [] Weight loss  [] Fever  [] Chills Cardiac: [] Chest pain   [] Chest pressure   [] Palpitations   [] Shortness of breath when laying flat   [] Shortness of breath with exertion. Vascular:  [x] Pain in legs with walking   [x] Pain in legs at rest  [] History of DVT   [] Phlebitis   [] Swelling in legs   [] Varicose veins   [] Non-healing ulcers Pulmonary:    [] Uses home oxygen   [] Productive cough   [] Hemoptysis   [] Wheeze  [] COPD   [] Asthma Neurologic:  [] Dizziness   [] Seizures   [] History of stroke   [] History of TIA  [] Aphasia   [] Vissual changes   [] Weakness or numbness in arm   [] Weakness or numbness in leg Musculoskeletal:   [] Joint swelling   [] Joint pain   [] Low back pain Hematologic:  [] Easy bruising  [] Easy bleeding   [] Hypercoagulable state   [] Anemic Gastrointestinal:  [] Diarrhea   [] Vomiting  [] Gastroesophageal reflux/heartburn   [] Difficulty swallowing. Genitourinary:  [] Chronic kidney disease   [] Difficult urination  [] Frequent urination   [] Blood in urine Skin:  [] Rashes   [] Ulcers  Psychological:  [] History of anxiety   []  History of major depression.  Physical Examination  Vitals:   11/19/21 1604  BP: (!) 156/76  Pulse: 79  Resp: 17  Weight: 155 lb (70.3 kg)  Height: 5\' 8"  (1.727 m)   Body mass index is 23.57 kg/m. Gen: WD/WN, NAD Head: [place]/AT, No temporalis wasting.  Ear/Nose/Throat: Hearing grossly intact, nares w/o erythema or drainage Eyes: PER, EOMI, sclera nonicteric.  Neck: Supple, no masses.  No bruit or JVD.  Pulmonary:  Good air movement, no audible wheezing, no use of accessory muscles.  Cardiac: RRR, normal S1, S2, no Murmurs. Vascular:   Vessel Right Left  Radial Palpable Palpable  PT Palpable Not palpable  DP Palpable Not palpable  Gastrointestinal: soft, non-distended. No guarding/no peritoneal signs.  Musculoskeletal: M/S 5/5 throughout.  No visible deformity.  Neurologic: CN 2-12 intact. Pain and light touch intact in extremities.  Symmetrical.  Speech is fluent. Motor exam as listed above. Psychiatric: Judgment intact, Mood & affect appropriate for pt's clinical situation. Dermatologic: No rashes or ulcers noted.  No changes consistent with cellulitis.   CBC Lab Results  Component Value Date   WBC 6.5 04/11/2021   HGB 12.2 04/11/2021   HCT 36.6 04/11/2021   MCV 97.9 04/11/2021   PLT 167  04/11/2021    BMET    Component Value Date/Time   BUN 14 04/10/2021 1331   CREATININE 0.62 04/10/2021 1331   GFRNONAA >60 04/10/2021 1331   CrCl cannot be calculated (Patient's most recent lab result is older than the maximum 21 days allowed.).  COAG No results found for: INR, PROTIME  Radiology No results found.   Assessment/Plan 1. 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  This represents a limb threatening ischemia and places the patient at the risk for left limb loss.  Patient should undergo angiography of the lower extremities  with the hope for intervention for limb salvage.  The risks and benefits as well as the alternative therapies was discussed in detail with the patient.  All questions were answered.  Patient agrees to proceed with left lower extremity angiography.  Given the patient's rather abrupt change in the severity of her symptoms I believe this is an emergency and will add her on tomorrow for angio intervention.  The patient will follow up with me in the office after the procedure.       2. Coronary artery disease of native artery of native heart with stable angina pectoris (HCC) Continue cardiac and antihypertensive medications as already ordered and reviewed, no changes at this time.  Continue statin as ordered and reviewed, no changes at this time  Nitrates PRN for chest pain   3. Popliteal artery injury, left, sequela Recommend:  The patient has evidence of severe atherosclerotic changes of both lower extremities with rest pain that is associated with preulcerative changes and impending tissue loss of the left foot.  This represents a limb threatening ischemia and places the patient at the risk for left limb  loss.  Patient should undergo angiography of the lower extremities with the hope for intervention for limb salvage.  The risks and benefits as well as the alternative therapies was discussed in detail with the patient.  All questions were answered.  Patient agrees to proceed with left lower extremity angiography.  Given the patient's rather abrupt change in the severity of her symptoms I believe this is an emergency and will add her on tomorrow for angio intervention.  The patient will follow up with me in the office after the procedure.   4. Mixed hyperlipidemia Continue statin as ordered and reviewed, no changes at this time   5. Hypothyroidism, unspecified type Continue hormone replacement as ordered and reviewed, no changes at this time     Levora Dredge, MD  11/20/2021 8:08 AM

## 2021-11-21 ENCOUNTER — Encounter: Payer: Self-pay | Admitting: Vascular Surgery

## 2021-12-05 ENCOUNTER — Other Ambulatory Visit: Payer: Self-pay

## 2021-12-05 ENCOUNTER — Inpatient Hospital Stay: Payer: Medicare Other | Attending: Obstetrics and Gynecology | Admitting: Obstetrics and Gynecology

## 2021-12-05 VITALS — BP 143/74 | HR 66 | Temp 98.7°F | Resp 20 | Wt 154.5 lb

## 2021-12-05 DIAGNOSIS — Z79899 Other long term (current) drug therapy: Secondary | ICD-10-CM | POA: Diagnosis not present

## 2021-12-05 DIAGNOSIS — Z800 Family history of malignant neoplasm of digestive organs: Secondary | ICD-10-CM | POA: Diagnosis not present

## 2021-12-05 DIAGNOSIS — Z90722 Acquired absence of ovaries, bilateral: Secondary | ICD-10-CM | POA: Diagnosis not present

## 2021-12-05 DIAGNOSIS — I2510 Atherosclerotic heart disease of native coronary artery without angina pectoris: Secondary | ICD-10-CM | POA: Insufficient documentation

## 2021-12-05 DIAGNOSIS — Z8249 Family history of ischemic heart disease and other diseases of the circulatory system: Secondary | ICD-10-CM | POA: Diagnosis not present

## 2021-12-05 DIAGNOSIS — Z7901 Long term (current) use of anticoagulants: Secondary | ICD-10-CM | POA: Insufficient documentation

## 2021-12-05 DIAGNOSIS — Z7902 Long term (current) use of antithrombotics/antiplatelets: Secondary | ICD-10-CM | POA: Diagnosis not present

## 2021-12-05 DIAGNOSIS — E785 Hyperlipidemia, unspecified: Secondary | ICD-10-CM | POA: Diagnosis not present

## 2021-12-05 DIAGNOSIS — N893 Dysplasia of vagina, unspecified: Secondary | ICD-10-CM | POA: Diagnosis not present

## 2021-12-05 DIAGNOSIS — Z7952 Long term (current) use of systemic steroids: Secondary | ICD-10-CM | POA: Insufficient documentation

## 2021-12-05 DIAGNOSIS — Z90710 Acquired absence of both cervix and uterus: Secondary | ICD-10-CM | POA: Diagnosis not present

## 2021-12-05 DIAGNOSIS — R1032 Left lower quadrant pain: Secondary | ICD-10-CM | POA: Diagnosis not present

## 2021-12-05 DIAGNOSIS — N890 Mild vaginal dysplasia: Secondary | ICD-10-CM | POA: Insufficient documentation

## 2021-12-05 DIAGNOSIS — Z809 Family history of malignant neoplasm, unspecified: Secondary | ICD-10-CM | POA: Diagnosis not present

## 2021-12-05 NOTE — Progress Notes (Signed)
GYN ONC Consult [facility]  Telephone:([phone] [phone]  Patient Care Team: Mick Sell, MD as PCP - General (Infectious Diseases) Benita Gutter, RN as Oncology Nurse Navigator   Name of the patient: Melanie Browning  [id number]  May 28, 1941   Date of visit: 12/05/2021  Gynecologic Oncology Interval Visit   Referring Provider: Dr. 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 She is s/p TAH-BSO on 09/05/2015, but final surgical path was negative for dysplasia.  After surgery, she was closely monitored every 6 months with colposcopies and PAPs. She was last seen in clinic on 06/06/21.   Biopsy 06/06/2021 to evaluate abnormal Pap  DIAGNOSIS:  A. VAGINA; BIOPSY:  - MINUTE FRAGMENT OF VAGINAL MUCOSA WITH PARTIALLY DETACHED EPITHELIUM  AND AT LEAST LOW-GRADE SQUAMOUS INTRAEPITHELIAL LESION (VAIN 1, MILD  DYSPLASIA).  - NEGATIVE FOR DEFINITE HIGH-GRADE SQUAMOUS INTRAEPITHELIAL LESION AND  MALIGNANCY.   She presents today for follow up exam and PAP.   Gynecologic Oncology History Melanie Browning is a pleasant G1P1 female who is seen in consultation from Dr. Sampson Goon for dysplasia. She is s/p TAH-BSO on 09/05/2015, but final surgical path was negative for dysplasia.  After surgery, she was closely monitored every 6 months with colposcopies and PAPs. Prior treatment in [place] for cervical and vaginal dysplasia from 2016 to 08/2019.   9/16 LSIL PAP, HR HPV+, Colposcopic biopsies 10/16, CIN1 on ectocervix, ECC negative. 11/16 [facility] biopsy LSIL, clear margins. ECC negative.  09/05/2015 TLH/BSO Dr Rowan Blase in [facility] for abnormal PAP. No residual disease in specimen. PAP 08/2016 LGSIL  PAP 02/09/2017 HSIL Colposcopy 02/2017 VAIN 1 PAP 05/2017 NILM 10/30/2017 ASCUS, HPV 16+ Colposcopy 11/26/2017 VAIN 1 Colposcopy  03/17/2018 No obvious disease noted (as per note from Dr. Pernell Dupre on 09/28/2019)  PAP 03/25/2019 LSIL/HPV+ Colposcopy with biopsy 04/20/2019 VAIN 1  PAP 09/21/2019, patient reports VAIN 1, HPV+ Colposcopy 7/21 normal PAP 10/25/20 HSIL, +HR HPV PAP 10/25/20 HSIL, -HR HPV  PAP 10/25/20 HSIL, +HR HPV Reported no gyn symptoms.   Colposcopy 2/22 negative We discussed options for management including surveillance versus Efudex treatment.  She was anxious and preferred treatment.  We prescribed Efudex once a week for 10 weeks and then RTC in 1-2 months for repeat PAP. She completed this course with some interruptions due to vulvar irritation despite skin protectants.   PAP 02/28/21 HSIL, HR HPV- No bleeding or discharge, slight odor.  Repeat Pap 04/18/21- ASC-H; cannot exclude high grade squamous intraepithelial.   Recommended she use Estradiol cream to improve health of vaginal mucosa prior to repeat colposcopy.          Other pertinent medical issues She had stent placed a few days ago in her leg due to artery narrowing.  On Plavix and ASA.  Small blood blister below right groin where vascular access was obtained. Bruises easily, but no bleeding.   She had colonoscopy for follow up of Lymphocytic colitis and that was stable.   Also having problems with intermittent claudication in left leg and underwent vascular percutaneous procedures including a popliteal stent in 12/21.  She is taking Plavix and ASA.   Problem List: Patient Active Problem List   Diagnosis Date Noted   Atherosclerosis of native arteries of extremity with rest pain (HCC) 11/20/2021   CAD (coronary artery disease) 12/10/2020   Coronary artery disease involving native  coronary artery of native heart 11/30/2020   S/P drug eluting coronary stent placement 11/15/2020   Abdominal pain, LLQ (left lower quadrant) 09/28/2020   Long term current use of anticoagulant therapy 09/28/2020   Functional diarrhea 09/28/2020   Chronic  venous insufficiency 04/23/2020   PAD (peripheral artery disease) (HCC) 04/19/2020   Hyperlipidemia 04/19/2020   Hypothyroidism 04/19/2020   Popliteal artery injury, left, 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   Disruption of external operation (surgical) wound, not elsewhere classified, initial encounter 11/09/2019   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) 10/29/2019   Stricture, artery (HCC) 10/29/2019   Critical lower limb ischemia (HCC) 10/13/2019   Hypothyroidism due to acquired atrophy of thyroid 04/07/2019   Syncope and collapse 04/07/2019   Senile osteoporosis 09/20/2016   Sleep disorder 07/18/2016   IGT (impaired glucose tolerance) 06/26/2015   Pap smear abnormality of cervix with ASCUS favoring dysplasia 06/26/2015   Family history of Guillain-Barre syndrome 04/26/2015   History of herpes genitalis 04/26/2015   History of HPV infection 04/26/2015   Dyslipidemia 04/17/2015   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   PMR (polymyalgia rheumatica) (HCC) 04/17/2015   Arthritis of shoulder region, 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   Past Medical History: Past Medical History:  Diagnosis Date   Arthritis    Colitis    [place]    [place]   HPV (human papilloma virus) infection    Hyperlipidemia    Ischemia of left lower extremity    Osteoporosis    Polymyalgia rheumatica (HCC)    Thyroid disease    Vaginal dysplasia    Past Surgical History: Past Surgical History:  Procedure Laterality Date   ABDOMINAL HYSTERECTOMY     complete   COLONOSCOPY WITH PROPOFOL N/A 10/04/2020   Procedure: COLONOSCOPY WITH PROPOFOL;  Surgeon: Toledo, Boykin Nearing, MD;  Location: [facility];  Service: Gastroenterology;  Laterality: N/A;   colposcobpy     COLPOSCOPY     CORONARY STENT INTERVENTION N/A 11/15/2020   Procedure: CORONARY STENT INTERVENTION;  Surgeon: Alwyn Pea, MD;   Location: [facility];  Service: Cardiovascular;  Laterality: N/A;   knee athroscopy Left    LAPAROSCOPIC HYSTERECTOMY     LEFT HEART CATH AND CORONARY ANGIOGRAPHY N/A 11/15/2020   Procedure: LEFT HEART CATH AND CORONARY ANGIOGRAPHY;  Surgeon: Alwyn Pea, MD;  Location: [facility];  Service: Cardiovascular;  Laterality: N/A;   LOWER EXTREMITY ANGIOGRAPHY Left 09/05/2020   Procedure: LOWER EXTREMITY ANGIOGRAPHY;  Surgeon: Renford Dills, MD;  Location: [facility];  Service: Cardiovascular;  Laterality: Left;   LOWER EXTREMITY ANGIOGRAPHY Left 04/10/2021   Procedure: LOWER EXTREMITY ANGIOGRAPHY;  Surgeon: Renford Dills, MD;  Location: [facility];  Service: Cardiovascular;  Laterality: Left;   LOWER EXTREMITY ANGIOGRAPHY Left 11/20/2021   Procedure: Lower Extremity Angiography;  Surgeon: Renford Dills, MD;  Location: [facility];  Service: Cardiovascular;  Laterality: Left;   Past Gynecologic History:  Menarche: 14 Menstrual details: Lasts 3 days Menses regular: yes Last Menstrual Period: Unknown History of OCP/HRT use: No History of Abnormal pap: yes,  as per HPI Last pap:  08/2019 History of STDs: The patient reports a past history of: herpes. Sexually active: not asked  OB History:  OB History  No obstetric history on file.   Family History: Family History  Problem Relation Age of Onset   Cancer  Mother    Pancreatic cancer Mother    Heart disease Father    Social History: Social History   Socioeconomic History   Marital status: Married    Spouse name: Norm    Number of children: 1   Years of education: Not on file   Highest education level: Not on file  Occupational History   Occupation: retired   Tobacco Use   Smoking status: Never   Smokeless tobacco: Never  Vaping Use   Vaping Use: Never used  Substance and Sexual Activity   Alcohol use: Not Currently    Alcohol/week: 3.0 standard drinks     Types: 3 Glasses of wine per week    Comment: 3 glasses of wine per week    Drug use: Never   Sexual activity: Yes  Other Topics Concern   Not on file  Social History Narrative   Lives with [facility] at [facility]    Social Determinants of Health   Financial Resource Strain: Not on file  Food Insecurity: Not on file  Transportation Needs: Not on file  Physical Activity: Not on file  Stress: Not on file  Social Connections: Not on file  Intimate Partner Violence: Not on file    Allergies: Allergies  Allergen Reactions   Lactose Other (See Comments)    Gi upset and diarrhea  Other reaction(s): Other (See Comments) Gi upset and diarrhea Gi upset and diarrhea Gi upset and diarrhea    Current Medications: Current Outpatient Medications  Medication Sig Dispense Refill   aspirin EC 81 MG tablet Take 81 mg by mouth every evening. Swallow whole.     atorvastatin (LIPITOR) 40 MG tablet Take 40 mg by mouth every evening.      budesonide (ENTOCORT EC) 3 MG 24 hr capsule Take 9 mg by mouth daily.     clopidogrel (PLAVIX) 75 MG tablet TAKE 1 TABLET BY MOUTH DAILY 30 tablet 5   diphenoxylate-atropine (LOMOTIL) 2.5-0.025 MG tablet TAKE 1 TABLET BY MOUTH 3 TIMES DAILY AS NEEDED FOR DIARRHEA. 90 tablet 0   diphenoxylate-atropine (LOMOTIL) 2.5-0.025 MG tablet TAKE 1 TABLET BY MOUTH 3 TIMES DAILY AS NEEDED FOR DIARRHEA. 90 tablet 0   levothyroxine (SYNTHROID) 75 MCG tablet Take 75 mcg by mouth daily before breakfast.      losartan (COZAAR) 25 MG tablet Take 25 mg by mouth daily.     nitroGLYCERIN (NITROSTAT) 0.4 MG SL tablet Place under the tongue.     amitriptyline (ELAVIL) 10 MG tablet TAKE 1 TABLET BY MOUTH NIGHTLY (Patient not taking: Reported on 10/04/2021) 90 tablet 0   amitriptyline (ELAVIL) 10 MG tablet Take 1 tablet (10 mg total) by mouth at bedtime. (Patient not taking: Reported on 12/05/2021) 90 tablet 0   diphenoxylate-atropine (LOMOTIL) 2.5-0.025 MG tablet TAKE 1 TABLET BY MOUTH 3  TIMES DAILY AS NEEDED FOR DIARRHEA. (Patient not taking: Reported on 11/19/2021) 90 tablet 0   estradiol (ESTRACE) 0.1 MG/GM vaginal cream Insert 1/4 applicator full into vagina at night twice a week (Patient not taking: Reported on 11/19/2021) 42.5 g 0   metroNIDAZOLE (METROGEL VAGINAL) 0.75 % vaginal gel Insert 5 grams (37.5 mg of metronidazole) of gel into vagina once daily at bedtime for five days (Patient not taking: Reported on 11/19/2021) 70 g 0   No current facility-administered medications for this visit.    Review of Systems General: no complaints  HEENT: no complaints  Lungs: no complaints  Cardiac: no complaints  GI: no complaints  GU: no  complaints  Musculoskeletal: no complaints  Extremities: no complaints  Skin: no complaints  Neuro: no complaints  Endocrine: no complaints  Psych: no complaints      Objective:  Physical Examination:  BP (!) 143/74    Pulse 66    Temp 98.7 F (37.1 C)    Resp 20    Wt 154 lb 8 oz (70.1 kg)    SpO2 100%    BMI 23.49 kg/m    ECOG Performance Status: 1 - Symptomatic but completely ambulatory  GENERAL: Patient is a well appearing female in no acute distress ABDOMEN:  Soft, nontender, nondistended. No ascites, masses or hernias.   EXTREMITIES:  No peripheral edema.   NEURO:  Nonfocal. Well oriented.  Appropriate affect.  Pelvic: chaperoned by CMA EGBUS: no lesions Cervix: surgically absent Vagina: no lesions, atrophic, no discharge or bleeding. Pap obtained Uterus: surgically absent BME: no palpable masses. There is a band involving the left vaginal wall that extends to the midline.     Assessment:  Derrionna Fanfan is a 81 y.o. female s/p TAH-BSO 09/05/2015 for dysplasia but no dysplasia in specimen.  Low grade PAP abnormalities since then with colposcopy negative.  PAP 12/20 VAIN1 HPV+.  Moved to this area and first seen 7/21 with normal colposcopy.  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  Colposcopy of vagina 2/22 with no lesions seen.   No  symptoms. We discussed options for management including surveillance versus Efudex treatment.  She is anxious.and preferred treatment.  She used  Efudex once a week for 10 weeks with some interruption for vulvar irritation. PAP 02/28/21 HSIL, HR HPV-. Confirmed on [facility] review.  04/18/21- ASC-H; cannot exclude high grade squamous intraepithelial. On colposcopy 06/06/2021 with Lugol's small area of non staining biopsied, VAIN1.   Vaginal atrophy.  Recent arterial stent with vascular for arterial narrowing in right popliteal area. Patient on Plavix and ASA.  Medical co-morbidities complicating care: PAD, Dyslipidemia  Plan:   Problem List Items Addressed This Visit   None Visit Diagnoses     VAIN (vaginal intraepithelial neoplasia)    -  Primary   Relevant Orders   IGP, Aptima HPV       Follow up Pap/HPV obtained today.   The patient's diagnosis, an outline of the further diagnostic and laboratory studies which will be required, the recommendation, and alternatives were discussed.  All questions were answered to the patient's satisfaction.  I personally had a face to face interaction and evaluated the patient jointly with the NP, Ms. Consuello Masse.  I have reviewed her history and available records and have performed the key portions of the physical exam including  abdominal exam, pelvic exam with my findings confirming those documented above by the APP.  I have discussed the case with the APP and the patient.  I agree with the above documentation, assessment and plan which was fully formulated by me.  Counseling was completed by me.   I personally saw the patient and performed a substantive portion of this encounter in conjunction with the listed APP as documented above.  Clevester Helzer Leta Jungling, MD

## 2021-12-06 ENCOUNTER — Ambulatory Visit (INDEPENDENT_AMBULATORY_CARE_PROVIDER_SITE_OTHER): Payer: Medicare Other | Admitting: Vascular Surgery

## 2021-12-06 ENCOUNTER — Encounter (INDEPENDENT_AMBULATORY_CARE_PROVIDER_SITE_OTHER): Payer: Self-pay | Admitting: Vascular Surgery

## 2021-12-06 VITALS — BP 144/76 | HR 78 | Resp 17 | Ht 68.0 in | Wt 154.0 lb

## 2021-12-06 DIAGNOSIS — I70212 Atherosclerosis of native arteries of extremities with intermittent claudication, left leg: Secondary | ICD-10-CM | POA: Diagnosis not present

## 2021-12-06 DIAGNOSIS — I25118 Atherosclerotic heart disease of native coronary artery with other forms of angina pectoris: Secondary | ICD-10-CM | POA: Diagnosis not present

## 2021-12-06 DIAGNOSIS — S85002S Unspecified injury of popliteal artery, left leg, sequela: Secondary | ICD-10-CM

## 2021-12-06 DIAGNOSIS — I70223 Atherosclerosis of native arteries of extremities with rest pain, bilateral legs: Secondary | ICD-10-CM

## 2021-12-06 DIAGNOSIS — E782 Mixed hyperlipidemia: Secondary | ICD-10-CM

## 2021-12-06 DIAGNOSIS — I872 Venous insufficiency (chronic) (peripheral): Secondary | ICD-10-CM

## 2021-12-07 ENCOUNTER — Encounter (INDEPENDENT_AMBULATORY_CARE_PROVIDER_SITE_OTHER): Payer: Self-pay | Admitting: Vascular Surgery

## 2021-12-07 NOTE — Progress Notes (Signed)
MRN : [id number]  Melanie Browning is a 81 y.o. (06-14-1941) female who presents with chief complaint of check leg.  History of Present Illness:  The patient returns to the office for followup and review status post angiogram with intervention. The patient notes improvement in the lower extremity symptoms. No interval shortening of the patient's claudication distance or rest pain symptoms.  No new ulcers or wounds have occurred since the last visit.  There have been no significant changes to the patient's overall health care.  The patient denies amaurosis fugax or recent TIA symptoms. There are no recent neurological changes noted. The patient denies history of DVT, PE or superficial thrombophlebitis. The patient denies recent episodes of angina or shortness of breath.    Current Meds  Medication Sig   aspirin EC 81 MG tablet Take 81 mg by mouth every evening. Swallow whole.   atorvastatin (LIPITOR) 40 MG tablet Take 40 mg by mouth every evening.    budesonide (ENTOCORT EC) 3 MG 24 hr capsule Take 9 mg by mouth daily.   clopidogrel (PLAVIX) 75 MG tablet TAKE 1 TABLET BY MOUTH DAILY   diphenoxylate-atropine (LOMOTIL) 2.5-0.025 MG tablet TAKE 1 TABLET BY MOUTH 3 TIMES DAILY AS NEEDED FOR DIARRHEA.   levothyroxine (SYNTHROID) 75 MCG tablet Take 75 mcg by mouth daily before breakfast.    losartan (COZAAR) 25 MG tablet Take 25 mg by mouth daily.   nitroGLYCERIN (NITROSTAT) 0.4 MG SL tablet Place under the tongue.    Past Medical History:  Diagnosis Date   Arthritis    Colitis    [place]   HPV (human papilloma virus) infection    Hyperlipidemia    Ischemia of left lower extremity    Osteoporosis    Polymyalgia rheumatica (HCC)    Thyroid disease    Vaginal dysplasia     Past Surgical History:  Procedure Laterality Date   ABDOMINAL HYSTERECTOMY     complete   COLONOSCOPY WITH PROPOFOL N/A 10/04/2020   Procedure: COLONOSCOPY WITH PROPOFOL;   Surgeon: Toledo, Boykin Nearing, MD;  Location: [facility];  Service: Gastroenterology;  Laterality: N/A;   colposcobpy     COLPOSCOPY     CORONARY STENT INTERVENTION N/A 11/15/2020   Procedure: CORONARY STENT INTERVENTION;  Surgeon: Alwyn Pea, MD;  Location: [facility];  Service: Cardiovascular;  Laterality: N/A;   knee athroscopy Left    LAPAROSCOPIC HYSTERECTOMY     LEFT HEART CATH AND CORONARY ANGIOGRAPHY N/A 11/15/2020   Procedure: LEFT HEART CATH AND CORONARY ANGIOGRAPHY;  Surgeon: Alwyn Pea, MD;  Location: [facility];  Service: Cardiovascular;  Laterality: N/A;   LOWER EXTREMITY ANGIOGRAPHY Left 09/05/2020   Procedure: LOWER EXTREMITY ANGIOGRAPHY;  Surgeon: Renford Dills, MD;  Location: [facility];  Service: Cardiovascular;  Laterality: Left;   LOWER EXTREMITY ANGIOGRAPHY Left 04/10/2021   Procedure: LOWER EXTREMITY ANGIOGRAPHY;  Surgeon: Renford Dills, MD;  Location: [facility];  Service: Cardiovascular;  Laterality: Left;   LOWER EXTREMITY ANGIOGRAPHY Left 11/20/2021   Procedure: Lower Extremity Angiography;  Surgeon: Renford Dills, MD;  Location: [facility];  Service: Cardiovascular;  Laterality: Left;    Social History Social History   Tobacco Use   Smoking status: Never   Smokeless tobacco: Never  Vaping Use   Vaping Use: Never used  Substance Use Topics   Alcohol use: Not Currently  Alcohol/week: 3.0 standard drinks    Types: 3 Glasses of wine per week    Comment: 3 glasses of wine per week    Drug use: Never    Family History Family History  Problem Relation Age of Onset   Cancer Mother    Pancreatic cancer Mother    Heart disease Father     Allergies  Allergen Reactions   Lactose Other (See Comments)    Gi upset and diarrhea  Other reaction(s): Other (See Comments) Gi upset and diarrhea Gi upset and diarrhea Gi upset and diarrhea     REVIEW OF SYSTEMS (Negative unless  checked)  Constitutional: [] Weight loss  [] Fever  [] Chills Cardiac: [] Chest pain   [] Chest pressure   [] Palpitations   [] Shortness of breath when laying flat   [] Shortness of breath with exertion. Vascular:  [] Pain in legs with walking   [] Pain in legs at rest  [] History of DVT   [] Phlebitis   [] Swelling in legs   [] Varicose veins   [] Non-healing ulcers Pulmonary:   [] Uses home oxygen   [] Productive cough   [] Hemoptysis   [] Wheeze  [] COPD   [] Asthma Neurologic:  [] Dizziness   [] Seizures   [] History of stroke   [] History of TIA  [] Aphasia   [] Vissual changes   [] Weakness or numbness in arm   [] Weakness or numbness in leg Musculoskeletal:   [] Joint swelling   [] Joint pain   [] Low back pain Hematologic:  [] Easy bruising  [] Easy bleeding   [] Hypercoagulable state   [] Anemic Gastrointestinal:  [] Diarrhea   [] Vomiting  [] Gastroesophageal reflux/heartburn   [] Difficulty swallowing. Genitourinary:  [] Chronic kidney disease   [] Difficult urination  [] Frequent urination   [] Blood in urine Skin:  [] Rashes   [] Ulcers  Psychological:  [] History of anxiety   []  History of major depression.  Physical Examination  Vitals:   12/06/21 1527  BP: (!) 144/76  Pulse: 78  Resp: 17  Weight: 154 lb (69.9 kg)  Height: 5\' 8"  (1.727 m)   Body mass index is 23.42 kg/m. Gen: WD/WN, NAD Head: [place]/AT, No temporalis wasting.  Ear/Nose/Throat: Hearing grossly intact, nares w/o erythema or drainage Eyes: PER, EOMI, sclera nonicteric.  Neck: Supple, no masses.  No bruit or JVD.  Pulmonary:  Good air movement, no audible wheezing, no use of accessory muscles.  Cardiac: RRR, normal S1, S2, no Murmurs. Vascular:  scattered varicosities present bilaterally.  Moderate venous stasis changes to the legs bilaterally.  1+ soft pitting edema  Vessel Right Left  Radial Palpable Palpable  Carotid Palpable Palpable  PT Palpable Palpable  DP Palpable Palpable  Gastrointestinal: soft, non-distended. No guarding/no peritoneal  signs.  Musculoskeletal: M/S 5/5 throughout.  No visible deformity.  Neurologic: CN 2-12 intact. Pain and light touch intact in extremities.  Symmetrical.  Speech is fluent. Motor exam as listed above. Psychiatric: Judgment intact, Mood & affect appropriate for pt's clinical situation. Dermatologic: Moderate venous rashes no ulcers noted.  No changes consistent with cellulitis.   CBC Lab Results  Component Value Date   WBC 6.5 04/11/2021   HGB 12.2 04/11/2021   HCT 36.6 04/11/2021   MCV 97.9 04/11/2021   PLT 167 04/11/2021    BMET    Component Value Date/Time   BUN 18 11/20/2021 1505   CREATININE 0.63 11/20/2021 1505   GFRNONAA >60 11/20/2021 1505   Estimated Creatinine Clearance: 57.5 mL/min (by C-G formula based on SCr of 0.63 mg/dL).  COAG No results found for: INR, PROTIME  Radiology PERIPHERAL VASCULAR CATHETERIZATION  Result Date: 11/20/2021 See surgical note for result.  VAS [place] LOWER EXTREMITY ARTERIAL DUPLEX  Result Date: 11/26/2021 LOWER EXTREMITY ARTERIAL DUPLEX STUDY Patient Name:  TAYLIN BELLIZZI  Date of Exam:   11/19/2021 Medical Rec #: [id number]      Accession #:    [id number] Date of Birth: 1941-01-26       Patient Gender: F Patient Age:   62 years Exam Location:  [facility] Procedure:      VAS [place] LOWER EXTREMITY ARTERIAL DUPLEX Referring Phys: Levora Dredge --------------------------------------------------------------------------------  Indications: Peripheral artery disease, and LLE pain.  Vascular Interventions: 10/13/2019: Left popliteal artery PTA;                         10/14/2019: Left popliteal artery reconstruction;                         02/03/2020: Left popliteal artery angioplasty;                         04/10/2021: Left distal SFA/Popliteal Artery                         thrombectomy/PTA/stent;. Current ABI:            not done Performing Technologist: Jamse Mead RT, RDMS, RVT  Examination Guidelines: A complete evaluation includes  B-mode imaging, spectral Doppler, color Doppler, and power Doppler as needed of all accessible portions of each vessel. Bilateral testing is considered an integral part of a complete examination. Limited examinations for reoccurring indications may be performed as noted.  +-----------+--------+-----+---------------+----------+--------+  LEFT        PSV cm/s Ratio Stenosis        Waveform   Comments  +-----------+--------+-----+---------------+----------+--------+  CFA Mid     141                            biphasic             +-----------+--------+-----+---------------+----------+--------+  DFA         90                             biphasic             +-----------+--------+-----+---------------+----------+--------+  SFA Prox    82                             monophasic           +-----------+--------+-----+---------------+----------+--------+  SFA Mid     74                             monophasic           +-----------+--------+-----+---------------+----------+--------+  SFA Distal  70                             monophasic           +-----------+--------+-----+---------------+----------+--------+  POP Prox    405            75-99% stenosis biphasic             +-----------+--------+-----+---------------+----------+--------+  POP Mid  40                             monophasic           +-----------+--------+-----+---------------+----------+--------+  POP Distal  49                             monophasic           +-----------+--------+-----+---------------+----------+--------+  ATA Distal  48                             biphasic             +-----------+--------+-----+---------------+----------+--------+  PTA Distal  50                             biphasic             +-----------+--------+-----+---------------+----------+--------+  PERO Distal 22                             monophasic           +-----------+--------+-----+---------------+----------+--------+  Summary: Left: Patent left femoral-popliteal  arterial system and stents with Doppler velocities suggestive of >75% proximal popliteal artery stenosis. Significant progression of disease noted when compared to the previous exam on 09/27/21.  See table(s) above for measurements and observations. Electronically signed by Levora Dredge MD on 11/26/2021 at 1:00:51 PM.    Final      Assessment/Plan 1. 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  The patient reports that the claudication symptoms and leg pain is essentially gone.   The patient denies lifestyle limiting changes at this point in time.  No further invasive studies, angiography or surgery at this time The patient should continue walking and begin a more formal exercise program.  The patient should continue antiplatelet therapy and aggressive treatment of the lipid abnormalities.  The patient should continue wearing graduated compression socks 10-15 mmHg strength to control the mild edema.  Patient should undergo noninvasive studies as ordered. The patient will follow up with me after the studies.   - VAS [place] ABI WITH/WO TBI; Future - VAS [place] LOWER EXTREMITY ARTERIAL DUPLEX; Future  2. Popliteal artery injury, left, sequela Recommend:  The patient is status post successful angiogram with intervention.  The patient reports that the claudication symptoms and leg pain is essentially gone.   The patient denies lifestyle limiting changes at this point in time.  No further invasive studies, angiography or surgery at this time The patient should continue walking and begin a more formal exercise program.  The patient should continue antiplatelet therapy and aggressive treatment of the lipid abnormalities  The patient should continue wearing graduated compression socks 10-15 mmHg strength to control the mild edema.  Patient should undergo noninvasive studies  as ordered. The patient will follow up with me after the studies.   - VAS [place] ABI WITH/WO TBI; Future - VAS [place] LOWER EXTREMITY ARTERIAL DUPLEX; Future  3. Chronic venous insufficiency No surgery or intervention at this point in time.    I have had a long discussion with the patient regarding venous insufficiency and why it  causes symptoms. I have discussed  with the patient the chronic skin changes that accompany venous insufficiency and the long term sequela such as infection and ulceration.  Patient will begin wearing graduated compression stockings class 1 (20-30 mmHg) or compression wraps on a daily basis a prescription was given. The patient will put the stockings on first thing in the morning and removing them in the evening. The patient is instructed specifically not to sleep in the stockings.    In addition, behavioral modification including several periods of elevation of the lower extremities during the day will be continued. I have demonstrated that proper elevation is a position with the ankles at heart level.  The patient is instructed to begin routine exercise, especially walking on a daily basis  4. Coronary artery disease of native artery of native heart with stable angina pectoris (HCC) Continue cardiac and antihypertensive medications as already ordered and reviewed, no changes at this time.  Continue statin as ordered and reviewed, no changes at this time  Nitrates PRN for chest pain   5. Mixed hyperlipidemia Continue statin as ordered and reviewed, no changes at this time     Levora Dredge, MD  12/07/2021 10:03 AM

## 2021-12-11 ENCOUNTER — Encounter: Payer: Self-pay | Admitting: Nurse Practitioner

## 2021-12-12 ENCOUNTER — Telehealth: Payer: Self-pay | Admitting: Nurse Practitioner

## 2021-12-12 DIAGNOSIS — N893 Dysplasia of vagina, unspecified: Secondary | ICD-10-CM

## 2021-12-12 NOTE — Telephone Encounter (Signed)
Spoke to patient regarding pap results. Plan for colpo. Appt scheduled.  ?

## 2021-12-17 ENCOUNTER — Telehealth: Payer: Self-pay | Admitting: *Deleted

## 2021-12-17 NOTE — Telephone Encounter (Signed)
Patient called stating that she needs to speak with Lauren ASAP regarding getting an earlier appointment than April in [place]  ?

## 2021-12-17 NOTE — Telephone Encounter (Signed)
Called and spoke with Ms. Melanie Browning. Dr. Johnnette Litter will be in clinic on 3/29. Her appointment has been rescheduled to 3/29 at 0930.

## 2021-12-19 ENCOUNTER — Other Ambulatory Visit
Admission: RE | Admit: 2021-12-19 | Discharge: 2021-12-19 | Disposition: A | Discharge status: Home / Self Care | Payer: Medicare Other | Source: Ambulatory Visit | Attending: Sports Medicine | Admitting: Sports Medicine

## 2021-12-19 DIAGNOSIS — M25541 Pain in joints of right hand: Secondary | ICD-10-CM | POA: Insufficient documentation

## 2021-12-19 LAB — SYNOVIAL CELL COUNT + DIFF, W/ CRYSTALS
Crystals, Fluid: NONE SEEN
Eosinophils-Synovial: 0 %
Lymphocytes-Synovial Fld: 6 %
Monocyte-Macrophage-Synovial Fluid: 2 %
Neutrophil, Synovial: 92 %
WBC, Synovial: 41603 /mm3 — ABNORMAL HIGH (ref 0–200)

## 2021-12-26 ENCOUNTER — Other Ambulatory Visit: Payer: Self-pay

## 2021-12-26 ENCOUNTER — Inpatient Hospital Stay (HOSPITAL_BASED_OUTPATIENT_CLINIC_OR_DEPARTMENT_OTHER): Payer: Medicare Other | Admitting: Obstetrics and Gynecology

## 2021-12-26 VITALS — BP 136/73 | HR 70 | Temp 98.7°F | Resp 20 | Wt 152.6 lb

## 2021-12-26 DIAGNOSIS — N893 Dysplasia of vagina, unspecified: Secondary | ICD-10-CM | POA: Diagnosis present

## 2021-12-26 DIAGNOSIS — N890 Mild vaginal dysplasia: Secondary | ICD-10-CM | POA: Diagnosis not present

## 2021-12-26 NOTE — Progress Notes (Signed)
? ?GYN ONC Consult ?[facility]  ?[phone] [phone] ? ?Patient Care Team: ?Mick Sell, MD as PCP - General (Infectious Diseases) ?Benita Gutter, RN as Oncology Nurse Navigator  ? ?Name of the patient: Melanie Browning  ?[id number]  ?10/09/1940  ? ?Date of visit: 12/26/2021 ? ?Gynecologic Oncology Interval Visit  ? ?Referring Provider: Dr. Sampson Goon  ? ?Chief Concern: Vaginal Dysplasia ? ?Subjective:  ?Melanie Browning is a 81 y.o. 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 She is s/p TAH-BSO on 09/05/2015, but final surgical path was negative for dysplasia.  After surgery, she was closely monitored every 6 months with colposcopies and PAPs.  ? ?She was last seen in clinic on 12/05/21.  PAP HSIL/HPV+ Returns for colposcopy.  No new complaints.  ? ?Biopsy 06/06/2021 to evaluate abnormal Pap  ?DIAGNOSIS:  ?A. VAGINA; BIOPSY:  ?- MINUTE FRAGMENT OF VAGINAL MUCOSA WITH PARTIALLY DETACHED EPITHELIUM  ?AND AT LEAST LOW-GRADE SQUAMOUS INTRAEPITHELIAL LESION (VAIN 1, MILD  ?DYSPLASIA).  ?- NEGATIVE FOR DEFINITE HIGH-GRADE SQUAMOUS INTRAEPITHELIAL LESION AND  ?MALIGNANCY.  ? ?She presents today for follow up exam and PAP.  ? ?Gynecologic Oncology History ?Melanie Browning is a pleasant G1P1 female who is seen in consultation from Dr. Sampson Goon for dysplasia. She is s/p TAH-BSO on 09/05/2015, but final surgical path was negative for dysplasia.  After surgery, she was closely monitored every 6 months with colposcopies and PAPs. Prior treatment in [place] for cervical and vaginal dysplasia from 2016 to 08/2019.  ? ?9/16 LSIL PAP, HR HPV+, Colposcopic biopsies 10/16, CIN1 on ectocervix, ECC negative. ?11/16 [facility] biopsy LSIL, clear margins. ECC negative.  ?09/05/2015 TLH/BSO Dr Rowan Blase in [facility] for abnormal PAP. No residual disease in specimen. ?PAP 08/2016 LGSIL  ?PAP 02/09/2017 HSIL ?Colposcopy 02/2017 VAIN 1 ?PAP 05/2017  NILM ?10/30/2017 ASCUS, HPV 16+ ?Colposcopy 11/26/2017 VAIN 1 ?Colposcopy 03/17/2018 No obvious disease noted (as per note from Dr. Pernell Dupre on 09/28/2019)  ?PAP 03/25/2019 LSIL/HPV+ ?Colposcopy with biopsy 04/20/2019 VAIN 1  ?PAP 09/21/2019, patient reports VAIN 1, HPV+ ?Colposcopy 7/21 normal ?PAP 10/25/20 HSIL, +HR HPV ?PAP 10/25/20 HSIL, -HR HPV ? ?PAP 10/25/20 HSIL, +HR HPV ?Reported no gyn symptoms.   ?Colposcopy 2/22 negative ?We discussed options for management including surveillance versus Efudex treatment.  She was anxious and preferred treatment.  We prescribed Efudex once a week for 10 weeks and then RTC in 1-2 months for repeat PAP. She completed this course with some interruptions due to vulvar irritation despite skin protectants.  ? ?PAP 02/28/21 HSIL, HR HPV- ?No bleeding or discharge, slight odor. ? ?Repeat Pap 04/18/21- ASC-H; cannot exclude high grade squamous intraepithelial.  ? ?Recommended she use Estradiol cream to improve health of vaginal mucosa prior to repeat colposcopy.   ? ?Other pertinent medical issues ?She had stent placed a few days ago in her leg due to artery narrowing.  On Plavix and ASA.  Small blood blister below right groin where vascular access was obtained. Bruises easily, but no bleeding.  ? ?She had colonoscopy for follow up of Lymphocytic colitis and that was stable.  ? ?Also having problems with intermittent claudication in left leg and underwent vascular percutaneous procedures including a popliteal stent in 12/21.  She is taking Plavix and ASA.  ? ?Problem List: ?Patient Active Problem List  ? Diagnosis Date Noted  ? Atherosclerosis of native arteries of extremity with rest pain (HCC) 11/20/2021  ? CAD (coronary artery disease) 12/10/2020  ?  Coronary artery disease involving native coronary artery of native heart 11/30/2020  ? S/P drug eluting coronary stent placement 11/15/2020  ? Abdominal pain, LLQ (left lower quadrant) 09/28/2020  ? Long term current use of anticoagulant  therapy 09/28/2020  ? Functional diarrhea 09/28/2020  ? Chronic venous insufficiency 04/23/2020  ? PAD (peripheral artery disease) (HCC) 04/19/2020  ? Hyperlipidemia 04/19/2020  ? Hypothyroidism 04/19/2020  ? Popliteal artery injury, left, 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  ? 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  ? Disruption of external operation (surgical) wound, not elsewhere classified, initial encounter 11/09/2019  ? 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 Stricture, artery (HCC) 10/29/2019  ? Critical lower limb ischemia (HCC) 10/13/2019  ? Hypothyroidism due to acquired atrophy of thyroid 04/07/2019  ? Syncope and collapse 04/07/2019  ? Senile osteoporosis 09/20/2016  ? Sleep disorder 07/18/2016  ? IGT (impaired glucose tolerance) 06/26/2015  ? Pap smear abnormality of cervix with ASCUS favoring dysplasia 06/26/2015  ? Family history of Guillain-Barre syndrome 04/26/2015  ? History of herpes genitalis 04/26/2015  ? History of HPV infection 04/26/2015  ? Dyslipidemia 04/17/2015  ? 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  ? PMR (polymyalgia rheumatica) (HCC) 04/17/2015  ? Arthritis of shoulder region, 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  ? 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  ? ?Past Medical History: ?Past Medical History:  ?Diagnosis Date  ? Arthritis   ? Colitis   ? Dry eye   ? bilateral when living in [facility]  ? HPV (human papilloma virus) infection   ? Hyperlipidemia   ? Ischemia of left lower extremity   ? Osteoporosis   ? Polymyalgia rheumatica (HCC)   ? Thyroid disease   ? Vaginal dysplasia   ? ?Past Surgical History: ?Past Surgical History:  ?Procedure Laterality Date  ? ABDOMINAL HYSTERECTOMY    ? complete  ? COLONOSCOPY WITH PROPOFOL N/A 10/04/2020  ? Procedure: COLONOSCOPY WITH PROPOFOL;  Surgeon: Toledo, Boykin Nearing, MD;  Location: [facility];  Service: Gastroenterology;  Laterality: N/A;  ? colposcobpy    ? COLPOSCOPY    ? CORONARY STENT INTERVENTION N/A 11/15/2020  ? Procedure:  CORONARY STENT INTERVENTION;  Surgeon: Alwyn Pea, MD;  Location: [facility];  Service: Cardiovascular;  Laterality: N/A;  ? knee athroscopy Left   ? LAPAROSCOPIC HYSTERECTOMY    ? LEFT HEART CATH AND CORONARY ANGIOGRAPHY N/A 11/15/2020  ? Procedure: LEFT HEART CATH AND CORONARY ANGIOGRAPHY;  Surgeon: Alwyn Pea, MD;  Location: [facility];  Service: Cardiovascular;  Laterality: N/A;  ? LOWER EXTREMITY ANGIOGRAPHY Left 09/05/2020  ? Procedure: LOWER EXTREMITY ANGIOGRAPHY;  Surgeon: Renford Dills, MD;  Location: [facility];  Service: Cardiovascular;  Laterality: Left;  ? LOWER EXTREMITY ANGIOGRAPHY Left 04/10/2021  ? Procedure: LOWER EXTREMITY ANGIOGRAPHY;  Surgeon: Renford Dills, MD;  Location: [facility];  Service: Cardiovascular;  Laterality: Left;  ? LOWER EXTREMITY ANGIOGRAPHY Left 11/20/2021  ? Procedure: Lower Extremity Angiography;  Surgeon: Renford Dills, MD;  Location: [facility];  Service: Cardiovascular;  Laterality: Left;  ? ?Past Gynecologic History:  ?Menarche: 14 ?Menstrual details: Lasts 3 days ?Menses regular: yes ?Last Menstrual Period: Unknown ?History of OCP/HRT use: No ?History of Abnormal pap: yes,  as per HPI ?Last pap:  08/2019 ?History of STDs: The patient reports a past history of: herpes. ?Sexually active: not asked ? ?OB History:  ?OB History  ?No obstetric history on file.  ? ?Family History: ?Family History  ?Problem Relation Age  of Onset  ? Cancer Mother   ? Pancreatic cancer Mother   ? Heart disease Father   ? ?Social History: ?Social History  ? ?Socioeconomic History  ? Marital status: Married  ?  Spouse name: Norm   ? Number of children: 1  ? Years of education: Not on file  ? Highest education level: Not on file  ?Occupational History  ? Occupation: retired   ?Tobacco Use  ? Smoking status: Never  ? Smokeless tobacco: Never  ?Vaping Use  ? Vaping Use: Never used  ?Substance and Sexual Activity  ? Alcohol  use: Not Currently  ?  Alcohol/week: 3.0 standard drinks  ?  Types: 3 Glasses of wine per week  ?  Comment: 3 glasses of wine per week   ? Drug use: Never  ? Sexual activity: Yes  ?Other Topics Concern  ? Not on file  ?Soci

## 2021-12-28 ENCOUNTER — Telehealth: Payer: Self-pay | Admitting: *Deleted

## 2021-12-28 LAB — SURGICAL PATHOLOGY

## 2021-12-28 NOTE — Telephone Encounter (Signed)
Patient called reporting that results form biopsy are back and she would appreciate a call to go over results ? ?Component 2 d ago  ?SURGICAL PATHOLOGY SURGICAL PATHOLOGY  ?CASE: [phone]  ?PATIENT: Melanie Browning  ?Surgical Pathology Report  ? ? ? ? ?Specimen Submitted:  ?A. Vaginal fornix, left  ? 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 ? ? ? ? ?DIAGNOSIS:  ?A. VAGINAL FORNIX, LEFT; BIOPSY:  ?- 1 FRAGMENT OF ATYPICAL SQUAMOUS MUCOSA ASSOCIATED WITH POSSIBLE  ?PREVIOUS BIOPSY SITE REACTION.  ?- SEPARATE FRAGMENT OF ATROPHIC VAGINAL MUCOSA.  ?- SEE COMMENT  ? ?Comment  ?There is a fragment of fibroconnective tissue stroma with focal  ?inflammatory cell reaction possibly representing a previous biopsy site.  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  ?P16 stain does not show characteristic blocklike positivity although the  ?basal cell nuclei stain positive.  Ki-67 stain does not show increased  ?proliferation as may be expected with a high-grade dysplastic lesion.  ?Given this patient's repeated positive high risk HPV testing results on  ?vaginal cytology specimens, close follow-up is suggested.  ? ?IHC slides were prepared by [facility], [place]. All controls stained  ?appropriately.  ? ?This test was developed and its performance characteristics determined  ?by [facility]. It has not been cleared or approved by the [place] Food and Drug  ?Administration. The FDA does not require this test to go through  ?premarket FDA review. This test is used for clinical purposes. It should  ?not be regarded as investigational or for research. This laboratory is  ?certified under the Clinical Laboratory Improvement Amendments (CLIA) as  ?qualified to perform high complexity clinical laboratory testing.  ? ? ? ?GROSS DESCRIPTION:  ?A. Labeled: Left vaginal fornix  ?Received: Formalin   ?Collection time: 10 AM on 12/26/2021  ?Placed into formalin time: 10 AM on 12/26/2021  ?Tissue fragment(s): 2  ?Size: Range from 0.4-0.5 cm  ?Description: Received is a fragment of tan finely lobulated mucosa and a  ?fragment of white soft tissue.  The resection margin of the mucosal  ?fragment is inked blue.  ?Entirely submitted in 1 cassette.  ? ?RB 12/26/2021  ? ?Final Diagnosis performed by Raynelle Bring, MD.   Electronically signed  ?12/28/2021 12:32:19PM  ?The electronic signature indicates that the named Attending Pathologist  ?has evaluated the specimen  ?Technical component performed at [facility], [address] [place],  ?[place] [address] Lab: [phone] Dir: Rush Farmer, MD, MMM  ? Professional component performed at [facility], [facility], [place], [place], [place] [address] Lab: [phone]  ?Dir: Kathi Simpers, MD   ?Resulting Agency [facility]  ?  ? ?  ?  ?Specimen Collected: 12/26/21 10:00 Last Resulted: 12/28/21 12:49  ?  ?  ? ?

## 2021-12-28 NOTE — Telephone Encounter (Signed)
Results have been shared with Ms. Melanie Browning. She is aware that Dr. Fransisca Connors will make final review and contact us regarding follow up.

## 2022-01-03 ENCOUNTER — Telehealth: Payer: Self-pay

## 2022-01-03 ENCOUNTER — Other Ambulatory Visit (INDEPENDENT_AMBULATORY_CARE_PROVIDER_SITE_OTHER): Payer: Self-pay | Admitting: Vascular Surgery

## 2022-01-03 NOTE — Telephone Encounter (Signed)
Dr. Johnnette Litter has reviewed recent biopsy and recommended 4 month follow up with repeat pap smear. These recommendations have been shared with Melanie Browning and appointment made. ?

## 2022-01-21 ENCOUNTER — Encounter (INDEPENDENT_AMBULATORY_CARE_PROVIDER_SITE_OTHER): Payer: Self-pay | Admitting: Vascular Surgery

## 2022-01-21 ENCOUNTER — Ambulatory Visit (INDEPENDENT_AMBULATORY_CARE_PROVIDER_SITE_OTHER): Payer: Medicare Other

## 2022-01-21 ENCOUNTER — Ambulatory Visit (INDEPENDENT_AMBULATORY_CARE_PROVIDER_SITE_OTHER): Payer: Medicare Other | Admitting: Vascular Surgery

## 2022-01-21 VITALS — BP 133/74 | HR 64 | Resp 17 | Ht 68.0 in | Wt 152.6 lb

## 2022-01-21 DIAGNOSIS — I70212 Atherosclerosis of native arteries of extremities with intermittent claudication, left leg: Secondary | ICD-10-CM | POA: Diagnosis not present

## 2022-01-21 DIAGNOSIS — I872 Venous insufficiency (chronic) (peripheral): Secondary | ICD-10-CM

## 2022-01-21 DIAGNOSIS — S85002S Unspecified injury of popliteal artery, left leg, sequela: Secondary | ICD-10-CM | POA: Diagnosis not present

## 2022-01-21 DIAGNOSIS — I70223 Atherosclerosis of native arteries of extremities with rest pain, bilateral legs: Secondary | ICD-10-CM | POA: Diagnosis not present

## 2022-01-21 DIAGNOSIS — I25118 Atherosclerotic heart disease of native coronary artery with other forms of angina pectoris: Secondary | ICD-10-CM

## 2022-01-21 DIAGNOSIS — E782 Mixed hyperlipidemia: Secondary | ICD-10-CM

## 2022-01-21 NOTE — Progress Notes (Signed)
? ? ? ? ?MRN : [id number] ? ?Melanie Browning is a 81 y.o. (1941-04-19) female who presents with chief complaint of check circulation. ? ?History of Present Illness:  ?The patient returns to the office for followup and review status post angiogram with intervention. The patient notes improvement in the lower extremity symptoms. No interval shortening of the patient's claudication distance or rest pain symptoms.  No new ulcers or wounds have occurred since the last visit. ?  ?There have been no significant changes to the patient's overall health care. ?  ?The patient denies amaurosis fugax or recent TIA symptoms. There are no recent neurological changes noted. ?The patient denies history of DVT, PE or superficial thrombophlebitis. ?The patient denies recent episodes of angina or shortness of breath.  ? ?ABI's Rt=1.20 and Lt=1.15 (previous study Rt=1.17 and Lt=1.15) ?Duplex ultrasound of the left lower extremity done today demonstrates widely patent arterial system, the popliteal stent is identified with laminar flow no evidence of a hemodynamically significant restenosis.  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 ? ?Current Meds  ?Medication Sig  ? aspirin EC 81 MG tablet Take 81 mg by mouth every evening. Swallow whole.  ? atorvastatin (LIPITOR) 40 MG tablet Take 40 mg by mouth every evening.   ? budesonide (ENTOCORT EC) 3 MG 24 hr capsule Take 9 mg by mouth daily.  ? clopidogrel (PLAVIX) 75 MG tablet TAKE 1 TABLET BY MOUTH DAILY  ? diphenoxylate-atropine (LOMOTIL) 2.5-0.025 MG tablet TAKE 1 TABLET BY MOUTH 3 TIMES DAILY AS NEEDED FOR DIARRHEA.  ? levothyroxine (SYNTHROID) 75 MCG tablet Take 75 mcg by mouth daily before breakfast.   ? losartan (COZAAR) 25 MG tablet Take 25 mg by mouth daily.  ? nitroGLYCERIN (NITROSTAT) 0.4 MG SL tablet Place under the tongue.  ? ? ?Past Medical History:  ?Diagnosis Date  ? Arthritis   ? Colitis   ? Dry eye   ? bilateral when living in [facility]  ? HPV (human papilloma virus)  infection   ? Hyperlipidemia   ? Ischemia of left lower extremity   ? Osteoporosis   ? Polymyalgia rheumatica (HCC)   ? Thyroid disease   ? Vaginal dysplasia   ? ? ?Past Surgical History:  ?Procedure Laterality Date  ? ABDOMINAL HYSTERECTOMY    ? complete  ? COLONOSCOPY WITH PROPOFOL N/A 10/04/2020  ? Procedure: COLONOSCOPY WITH PROPOFOL;  Surgeon: Toledo, Boykin Nearing, MD;  Location: [facility] ENDOSCOPY;  Service: Gastroenterology;  Laterality: N/A;  ? colposcobpy    ? COLPOSCOPY    ? CORONARY STENT INTERVENTION N/A 11/15/2020  ? Procedure: CORONARY STENT INTERVENTION;  Surgeon: Alwyn Pea, MD;  Location: [facility] INVASIVE CV LAB;  Service: Cardiovascular;  Laterality: N/A;  ? knee athroscopy Left   ? LAPAROSCOPIC HYSTERECTOMY    ? LEFT HEART CATH AND CORONARY ANGIOGRAPHY N/A 11/15/2020  ? Procedure: LEFT HEART CATH AND CORONARY ANGIOGRAPHY;  Surgeon: Alwyn Pea, MD;  Location: [facility] INVASIVE CV LAB;  Service: Cardiovascular;  Laterality: N/A;  ? LOWER EXTREMITY ANGIOGRAPHY Left 09/05/2020  ? Procedure: LOWER EXTREMITY ANGIOGRAPHY;  Surgeon: Renford Dills, MD;  Location: [facility] INVASIVE CV LAB;  Service: Cardiovascular;  Laterality: Left;  ? LOWER EXTREMITY ANGIOGRAPHY Left 04/10/2021  ? Procedure: LOWER EXTREMITY ANGIOGRAPHY;  Surgeon: Renford Dills, MD;  Location: [facility] INVASIVE CV LAB;  Service: Cardiovascular;  Laterality: Left;  ? LOWER EXTREMITY ANGIOGRAPHY Left 11/20/2021  ? Procedure: Lower Extremity Angiography;  Surgeon: Renford Dills, MD;  Location: [facility]  INVASIVE CV LAB;  Service: Cardiovascular;  Laterality: Left;  ? ? ?Social History ?Social History  ? ?Tobacco Use  ? Smoking status: Never  ? Smokeless tobacco: Never  ?Vaping Use  ? Vaping Use: Never used  ?Substance Use Topics  ? Alcohol use: Not Currently  ?  Alcohol/week: 3.0 standard drinks  ?  Types: 3 Glasses of wine per week  ?  Comment: 3 glasses of wine per week   ? Drug use: Never  ? ? ?Family History ?Family History  ?Problem  Relation Age of Onset  ? Cancer Mother   ? Pancreatic cancer Mother   ? Heart disease Father   ? ? ?Allergies  ?Allergen Reactions  ? Lactose Other (See Comments)  ?  Gi upset and diarrhea ? ?Other reaction(s): Other (See Comments) ?Gi upset and diarrhea ?Gi upset and diarrhea ?Gi upset and diarrhea  ? ? ? ?REVIEW OF SYSTEMS (Negative unless checked) ? ?Constitutional: [] Weight loss  [] Fever  [] Chills ?Cardiac: [] Chest pain   [] Chest pressure   [] Palpitations   [] Shortness of breath when laying flat   [] Shortness of breath with exertion. ?Vascular:  [x] Pain in legs with walking   [] Pain in legs at rest  [] History of DVT   [] Phlebitis   [] Swelling in legs   [] Varicose veins   [] Non-healing ulcers ?Pulmonary:   [] Uses home oxygen   [] Productive cough   [] Hemoptysis   [] Wheeze  [] COPD   [] Asthma ?Neurologic:  [] Dizziness   [] Seizures   [] History of stroke   [] History of TIA  [] Aphasia   [] Vissual changes   [] Weakness or numbness in arm   [] Weakness or numbness in leg ?Musculoskeletal:   [] Joint swelling   [] Joint pain   [] Low back pain ?Hematologic:  [] Easy bruising  [] Easy bleeding   [] Hypercoagulable state   [] Anemic ?Gastrointestinal:  [] Diarrhea   [] Vomiting  [] Gastroesophageal reflux/heartburn   [] Difficulty swallowing. ?Genitourinary:  [] Chronic kidney disease   [] Difficult urination  [] Frequent urination   [] Blood in urine ?Skin:  [] Rashes   [] Ulcers  ?Psychological:  [] History of anxiety   []  History of major depression. ? ?Physical Examination ? ?Vitals:  ? 01/21/22 1148  ?BP: 133/74  ?Pulse: 64  ?Resp: 17  ?Weight: 152 lb 9.6 oz (69.2 kg)  ?Height: 5\' 8"  (1.727 m)  ? ?Body mass index is 23.2 kg/m?. ?Gen: WD/WN, NAD ?Head: [place]/AT, No temporalis wasting.  ?Ear/Nose/Throat: Hearing grossly intact, nares w/o erythema or drainage ?Eyes: PER, EOMI, sclera nonicteric.  ?Neck: Supple, no masses.  No bruit or JVD.  ?Pulmonary:  Good air movement, no audible wheezing, no use of accessory muscles.  ?Cardiac: RRR, normal  S1, S2, no Murmurs. ?Vascular:  mild trophic changes, no open wounds, moderate venous changes ?Vessel Right Left  ?Radial Palpable Palpable  ?PT  Palpable Palpable  ?DP  Palpable  Palpable  ?Gastrointestinal: soft, non-distended. No guarding/no peritoneal signs.  ?Musculoskeletal: M/S 5/5 throughout.  No visible deformity.  ?Neurologic: CN 2-12 intact. Pain and light touch intact in extremities.  Symmetrical.  Speech is fluent. Motor exam as listed above. ?Psychiatric: Judgment intact, Mood & affect appropriate for pt's clinical situation. ?Dermatologic: No rashes or ulcers noted.  No changes consistent with cellulitis. ? ? ?CBC ?Lab Results  ?Component Value Date  ? WBC 6.5 04/11/2021  ? HGB 12.2 04/11/2021  ? HCT 36.6 04/11/2021  ? MCV 97.9 04/11/2021  ? PLT 167 04/11/2021  ? ? ?BMET ?   ?Component Value Date/Time  ? BUN 18 11/20/2021 1505  ?  CREATININE 0.63 11/20/2021 1505  ? GFRNONAA >60 11/20/2021 1505  ? ?CrCl cannot be calculated (Patient's most recent lab result is older than the maximum 21 days allowed.). ? ?COAG ?No results found for: INR, PROTIME ? ?Radiology ?No results found. ? ? ?Assessment/Plan ?1. 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 Recommend: ? ?The patient has evidence of atherosclerosis of the lower extremities with claudication.  The patient does not voice lifestyle limiting changes at this point in time. ? ?Noninvasive studies do not suggest clinically significant change. ? ?No invasive studies, angiography or surgery at this time ?The patient should continue walking and begin a more formal exercise program.  ?The patient should continue antiplatelet therapy and aggressive treatment of the lipid abnormalities ? ?No changes in the patient's medications at this time ? ?Continued surveillance is indicated as atherosclerosis is likely to progress with time.   ? ?The patient will continue follow up with noninvasive studies as ordered.   ?-  VAS [place] ABI WITH/WO TBI; Future ?- VAS [place] LOWER EXTREMITY ARTERIAL DUPLEX; Future ? ?2. Chronic venous insufficiency ?No surgery or intervention at this point in time.   ? ?The small amount of chronic STP in t

## 2022-01-23 ENCOUNTER — Ambulatory Visit: Payer: Medicare Other

## 2022-02-14 DIAGNOSIS — M25532 Pain in left wrist: Secondary | ICD-10-CM | POA: Insufficient documentation

## 2022-04-04 ENCOUNTER — Encounter (INDEPENDENT_AMBULATORY_CARE_PROVIDER_SITE_OTHER): Payer: Medicare Other

## 2022-04-04 ENCOUNTER — Ambulatory Visit (INDEPENDENT_AMBULATORY_CARE_PROVIDER_SITE_OTHER): Payer: Medicare Other | Admitting: Vascular Surgery

## 2022-04-06 DIAGNOSIS — M62838 Other muscle spasm: Secondary | ICD-10-CM | POA: Insufficient documentation

## 2022-04-06 DIAGNOSIS — M47812 Spondylosis without myelopathy or radiculopathy, cervical region: Secondary | ICD-10-CM | POA: Insufficient documentation

## 2022-04-08 ENCOUNTER — Encounter (INDEPENDENT_AMBULATORY_CARE_PROVIDER_SITE_OTHER): Payer: Self-pay | Admitting: Vascular Surgery

## 2022-04-08 ENCOUNTER — Ambulatory Visit (INDEPENDENT_AMBULATORY_CARE_PROVIDER_SITE_OTHER): Payer: Medicare Other

## 2022-04-08 ENCOUNTER — Ambulatory Visit (INDEPENDENT_AMBULATORY_CARE_PROVIDER_SITE_OTHER): Payer: Medicare Other | Admitting: Vascular Surgery

## 2022-04-08 VITALS — BP 144/78 | HR 84 | Resp 16 | Wt 144.8 lb

## 2022-04-08 DIAGNOSIS — I25118 Atherosclerotic heart disease of native coronary artery with other forms of angina pectoris: Secondary | ICD-10-CM | POA: Diagnosis not present

## 2022-04-08 DIAGNOSIS — I70212 Atherosclerosis of native arteries of extremities with intermittent claudication, left leg: Secondary | ICD-10-CM

## 2022-04-08 DIAGNOSIS — I70223 Atherosclerosis of native arteries of extremities with rest pain, bilateral legs: Secondary | ICD-10-CM | POA: Diagnosis not present

## 2022-04-08 DIAGNOSIS — I872 Venous insufficiency (chronic) (peripheral): Secondary | ICD-10-CM | POA: Diagnosis not present

## 2022-04-08 DIAGNOSIS — E785 Hyperlipidemia, unspecified: Secondary | ICD-10-CM

## 2022-04-12 ENCOUNTER — Encounter (INDEPENDENT_AMBULATORY_CARE_PROVIDER_SITE_OTHER): Payer: Self-pay | Admitting: Vascular Surgery

## 2022-04-12 NOTE — Progress Notes (Signed)
MRN : [id number]  Melanie Browning is a 81 y.o. (September 01, 1941) female who presents with chief complaint of check circulation.  History of Present Illness:   The patient returns to the office for followup and review status post angiogram with intervention. The patient notes improvement in the lower extremity symptoms. No interval shortening of the patient's claudication distance or rest pain symptoms.  No new ulcers or wounds have occurred since the last visit.   There have been no significant changes to the patient's overall health care.   The patient denies amaurosis fugax or recent TIA symptoms. There are no recent neurological changes noted. The patient denies history of DVT, PE or superficial thrombophlebitis. The patient denies recent episodes of angina or shortness of breath.    ABI's Rt=1.070 and Lt=1.07 (previous study Rt=1.20 and Lt=1.15) Duplex ultrasound of the left lower extremity done today demonstrates widely patent arterial system, the popliteal stent is identified with laminar flow no evidence of a hemodynamically significant restenosis.      Current Meds  Medication Sig   aspirin EC 81 MG tablet Take 81 mg by mouth every evening. Swallow whole.   atorvastatin (LIPITOR) 40 MG tablet Take 40 mg by mouth every evening.    budesonide (ENTOCORT EC) 3 MG 24 hr capsule Take 9 mg by mouth daily.   clopidogrel (PLAVIX) 75 MG tablet TAKE 1 TABLET BY MOUTH DAILY   diphenoxylate-atropine (LOMOTIL) 2.5-0.025 MG tablet TAKE 1 TABLET BY MOUTH 3 TIMES DAILY AS NEEDED FOR DIARRHEA.   levothyroxine (SYNTHROID) 75 MCG tablet Take 75 mcg by mouth daily before breakfast.    losartan (COZAAR) 25 MG tablet Take 25 mg by mouth daily.   nitroGLYCERIN (NITROSTAT) 0.4 MG SL tablet Place under the tongue.    Past Medical History:  Diagnosis Date   Arthritis    Colitis    [place]    [place]   HPV (human papilloma virus) infection    Hyperlipidemia     Ischemia of left lower extremity    Osteoporosis    Polymyalgia rheumatica (HCC)    Thyroid disease    Vaginal dysplasia     Past Surgical History:  Procedure Laterality Date   ABDOMINAL HYSTERECTOMY     complete   COLONOSCOPY WITH PROPOFOL N/A 10/04/2020   Procedure: COLONOSCOPY WITH PROPOFOL;  Surgeon: Toledo, Boykin Nearing, MD;  Location: [facility];  Service: Gastroenterology;  Laterality: N/A;   colposcobpy     COLPOSCOPY     CORONARY STENT INTERVENTION N/A 11/15/2020   Procedure: CORONARY STENT INTERVENTION;  Surgeon: Alwyn Pea, MD;  Location: [facility];  Service: Cardiovascular;  Laterality: N/A;   knee athroscopy Left    LAPAROSCOPIC HYSTERECTOMY     LEFT HEART CATH AND CORONARY ANGIOGRAPHY N/A 11/15/2020   Procedure: LEFT HEART CATH AND CORONARY ANGIOGRAPHY;  Surgeon: Alwyn Pea, MD;  Location: [facility];  Service: Cardiovascular;  Laterality: N/A;   LOWER EXTREMITY ANGIOGRAPHY Left 09/05/2020   Procedure: LOWER EXTREMITY ANGIOGRAPHY;  Surgeon: Renford Dills, MD;  Location: [facility];  Service: Cardiovascular;  Laterality: Left;   LOWER EXTREMITY ANGIOGRAPHY Left 04/10/2021   Procedure: LOWER EXTREMITY ANGIOGRAPHY;  Surgeon: Renford Dills, MD;  Location: [facility];  Service: Cardiovascular;  Laterality: Left;   LOWER EXTREMITY ANGIOGRAPHY Left 11/20/2021   Procedure: Lower Extremity Angiography;  Surgeon:  11/20/2021 1505   CREATININE 0.63 11/20/2021 1505   GFRNONAA >60 11/20/2021 1505   CrCl cannot be calculated (Patient's most recent lab result is older than the maximum 21 days allowed.).  COAG No results found for: "INR", "PROTIME"  Radiology VAS [place] LOWER EXTREMITY ARTERIAL DUPLEX  Result Date: 04/08/2022 LOWER EXTREMITY ARTERIAL DUPLEX STUDY Patient Name:  Melanie Browning  Date of Exam:   04/08/2022 Medical Rec #: [id number]      Accession #:    [id number] Date of Birth: Apr 19, 1941       Patient Gender: F Patient Age:   37 years Exam Location:  [facility] Procedure:      VAS [place] LOWER EXTREMITY ARTERIAL DUPLEX Referring Phys: Earl Lites Krystianna Soth --------------------------------------------------------------------------------   Current ABI: lt = 1.07 Comparison Study: 01/24/2022 Performing Technologist: Salvadore Farber RVT  Examination Guidelines: A complete evaluation includes B-mode imaging, spectral Doppler, color Doppler, and power Doppler as needed of all accessible portions of each vessel. Bilateral testing is considered an integral part of a complete examination. Limited examinations for reoccurring indications may be performed as  noted.   +----------+--------+-----+--------+--------+--------+ LEFT      PSV cm/sRatioStenosisWaveformComments +----------+--------+-----+--------+--------+--------+ CFA Mid   103                  biphasic         +----------+--------+-----+--------+--------+--------+ DFA       81                   biphasic         +----------+--------+-----+--------+--------+--------+ SFA Prox  85                   biphasic         +----------+--------+-----+--------+--------+--------+ SFA Mid   114                  biphasic         +----------+--------+-----+--------+--------+--------+ SFA Distal97                   biphasicstent    +----------+--------+-----+--------+--------+--------+ POP Distal54                   biphasicstent    +----------+--------+-----+--------+--------+--------+ ATA Distal101                  biphasic         +----------+--------+-----+--------+--------+--------+ PTA Distal63                   biphasic         +----------+--------+-----+--------+--------+--------+  Summary: Left: Normal flow and patent stents throughout.  See table(s) above for measurements and observations. Electronically signed by Levora Dredge MD on 04/08/2022 at 5:23:53 PM.    Final    VAS [place] ABI WITH/WO TBI  Result Date: 04/08/2022  LOWER EXTREMITY DOPPLER STUDY Patient Name:  Melanie Browning  Date of Exam:   04/08/2022 Medical Rec #: [id number]      Accession #:    [id number] Date of Birth: 11/12/1940       Patient Gender: F Patient Age:   54 years Exam Location:  [facility] Procedure:      VAS [place] ABI WITH/WO TBI Referring Phys: --------------------------------------------------------------------------------  Indications: Peripheral artery disease.  Vascular Interventions: 10/13/2019: Left popliteal artery PTA;                         10/14/2019: Left popliteal artery  MRN : [id number]  Melanie Browning is a 81 y.o. (September 01, 1941) female who presents with chief complaint of check circulation.  History of Present Illness:   The patient returns to the office for followup and review status post angiogram with intervention. The patient notes improvement in the lower extremity symptoms. No interval shortening of the patient's claudication distance or rest pain symptoms.  No new ulcers or wounds have occurred since the last visit.   There have been no significant changes to the patient's overall health care.   The patient denies amaurosis fugax or recent TIA symptoms. There are no recent neurological changes noted. The patient denies history of DVT, PE or superficial thrombophlebitis. The patient denies recent episodes of angina or shortness of breath.    ABI's Rt=1.070 and Lt=1.07 (previous study Rt=1.20 and Lt=1.15) Duplex ultrasound of the left lower extremity done today demonstrates widely patent arterial system, the popliteal stent is identified with laminar flow no evidence of a hemodynamically significant restenosis.      Current Meds  Medication Sig   aspirin EC 81 MG tablet Take 81 mg by mouth every evening. Swallow whole.   atorvastatin (LIPITOR) 40 MG tablet Take 40 mg by mouth every evening.    budesonide (ENTOCORT EC) 3 MG 24 hr capsule Take 9 mg by mouth daily.   clopidogrel (PLAVIX) 75 MG tablet TAKE 1 TABLET BY MOUTH DAILY   diphenoxylate-atropine (LOMOTIL) 2.5-0.025 MG tablet TAKE 1 TABLET BY MOUTH 3 TIMES DAILY AS NEEDED FOR DIARRHEA.   levothyroxine (SYNTHROID) 75 MCG tablet Take 75 mcg by mouth daily before breakfast.    losartan (COZAAR) 25 MG tablet Take 25 mg by mouth daily.   nitroGLYCERIN (NITROSTAT) 0.4 MG SL tablet Place under the tongue.    Past Medical History:  Diagnosis Date   Arthritis    Colitis    Dry [place]    [place]   HPV (human papilloma virus) infection    Hyperlipidemia     Ischemia of left lower extremity    Osteoporosis    Polymyalgia rheumatica (HCC)    Thyroid disease    Vaginal dysplasia     Past Surgical History:  Procedure Laterality Date   ABDOMINAL HYSTERECTOMY     complete   COLONOSCOPY WITH PROPOFOL N/A 10/04/2020   Procedure: COLONOSCOPY WITH PROPOFOL;  Surgeon: Toledo, Boykin Nearing, MD;  Location: [facility];  Service: Gastroenterology;  Laterality: N/A;   colposcobpy     COLPOSCOPY     CORONARY STENT INTERVENTION N/A 11/15/2020   Procedure: CORONARY STENT INTERVENTION;  Surgeon: Alwyn Pea, MD;  Location: [facility];  Service: Cardiovascular;  Laterality: N/A;   knee athroscopy Left    LAPAROSCOPIC HYSTERECTOMY     LEFT HEART CATH AND CORONARY ANGIOGRAPHY N/A 11/15/2020   Procedure: LEFT HEART CATH AND CORONARY ANGIOGRAPHY;  Surgeon: Alwyn Pea, MD;  Location: [facility];  Service: Cardiovascular;  Laterality: N/A;   LOWER EXTREMITY ANGIOGRAPHY Left 09/05/2020   Procedure: LOWER EXTREMITY ANGIOGRAPHY;  Surgeon: Renford Dills, MD;  Location: [facility];  Service: Cardiovascular;  Laterality: Left;   LOWER EXTREMITY ANGIOGRAPHY Left 04/10/2021   Procedure: LOWER EXTREMITY ANGIOGRAPHY;  Surgeon: Renford Dills, MD;  Location: [facility];  Service: Cardiovascular;  Laterality: Left;   LOWER EXTREMITY ANGIOGRAPHY Left 11/20/2021   Procedure: Lower Extremity Angiography;  Surgeon:  MRN : [id number]  Melanie Browning is a 81 y.o. (September 01, 1941) female who presents with chief complaint of check circulation.  History of Present Illness:   The patient returns to the office for followup and review status post angiogram with intervention. The patient notes improvement in the lower extremity symptoms. No interval shortening of the patient's claudication distance or rest pain symptoms.  No new ulcers or wounds have occurred since the last visit.   There have been no significant changes to the patient's overall health care.   The patient denies amaurosis fugax or recent TIA symptoms. There are no recent neurological changes noted. The patient denies history of DVT, PE or superficial thrombophlebitis. The patient denies recent episodes of angina or shortness of breath.    ABI's Rt=1.070 and Lt=1.07 (previous study Rt=1.20 and Lt=1.15) Duplex ultrasound of the left lower extremity done today demonstrates widely patent arterial system, the popliteal stent is identified with laminar flow no evidence of a hemodynamically significant restenosis.      Current Meds  Medication Sig   aspirin EC 81 MG tablet Take 81 mg by mouth every evening. Swallow whole.   atorvastatin (LIPITOR) 40 MG tablet Take 40 mg by mouth every evening.    budesonide (ENTOCORT EC) 3 MG 24 hr capsule Take 9 mg by mouth daily.   clopidogrel (PLAVIX) 75 MG tablet TAKE 1 TABLET BY MOUTH DAILY   diphenoxylate-atropine (LOMOTIL) 2.5-0.025 MG tablet TAKE 1 TABLET BY MOUTH 3 TIMES DAILY AS NEEDED FOR DIARRHEA.   levothyroxine (SYNTHROID) 75 MCG tablet Take 75 mcg by mouth daily before breakfast.    losartan (COZAAR) 25 MG tablet Take 25 mg by mouth daily.   nitroGLYCERIN (NITROSTAT) 0.4 MG SL tablet Place under the tongue.    Past Medical History:  Diagnosis Date   Arthritis    Colitis    Dry eye    [place]   HPV (human papilloma virus) infection    Hyperlipidemia     Ischemia of left lower extremity    Osteoporosis    Polymyalgia rheumatica (HCC)    Thyroid disease    Vaginal dysplasia     Past Surgical History:  Procedure Laterality Date   ABDOMINAL HYSTERECTOMY     complete   COLONOSCOPY WITH PROPOFOL N/A 10/04/2020   Procedure: COLONOSCOPY WITH PROPOFOL;  Surgeon: Toledo, Boykin Nearing, MD;  Location: [facility];  Service: Gastroenterology;  Laterality: N/A;   colposcobpy     COLPOSCOPY     CORONARY STENT INTERVENTION N/A 11/15/2020   Procedure: CORONARY STENT INTERVENTION;  Surgeon: Alwyn Pea, MD;  Location: [facility];  Service: Cardiovascular;  Laterality: N/A;   knee athroscopy Left    LAPAROSCOPIC HYSTERECTOMY     LEFT HEART CATH AND CORONARY ANGIOGRAPHY N/A 11/15/2020   Procedure: LEFT HEART CATH AND CORONARY ANGIOGRAPHY;  Surgeon: Alwyn Pea, MD;  Location: [facility];  Service: Cardiovascular;  Laterality: N/A;   LOWER EXTREMITY ANGIOGRAPHY Left 09/05/2020   Procedure: LOWER EXTREMITY ANGIOGRAPHY;  Surgeon: Renford Dills, MD;  Location: [facility];  Service: Cardiovascular;  Laterality: Left;   LOWER EXTREMITY ANGIOGRAPHY Left 04/10/2021   Procedure: LOWER EXTREMITY ANGIOGRAPHY;  Surgeon: Renford Dills, MD;  Location: [facility];  Service: Cardiovascular;  Laterality: Left;   LOWER EXTREMITY ANGIOGRAPHY Left 11/20/2021   Procedure: Lower Extremity Angiography;  Surgeon:  MRN : [id number]  Melanie Browning is a 81 y.o. (September 01, 1941) female who presents with chief complaint of check circulation.  History of Present Illness:   The patient returns to the office for followup and review status post angiogram with intervention. The patient notes improvement in the lower extremity symptoms. No interval shortening of the patient's claudication distance or rest pain symptoms.  No new ulcers or wounds have occurred since the last visit.   There have been no significant changes to the patient's overall health care.   The patient denies amaurosis fugax or recent TIA symptoms. There are no recent neurological changes noted. The patient denies history of DVT, PE or superficial thrombophlebitis. The patient denies recent episodes of angina or shortness of breath.    ABI's Rt=1.070 and Lt=1.07 (previous study Rt=1.20 and Lt=1.15) Duplex ultrasound of the left lower extremity done today demonstrates widely patent arterial system, the popliteal stent is identified with laminar flow no evidence of a hemodynamically significant restenosis.      Current Meds  Medication Sig   aspirin EC 81 MG tablet Take 81 mg by mouth every evening. Swallow whole.   atorvastatin (LIPITOR) 40 MG tablet Take 40 mg by mouth every evening.    budesonide (ENTOCORT EC) 3 MG 24 hr capsule Take 9 mg by mouth daily.   clopidogrel (PLAVIX) 75 MG tablet TAKE 1 TABLET BY MOUTH DAILY   diphenoxylate-atropine (LOMOTIL) 2.5-0.025 MG tablet TAKE 1 TABLET BY MOUTH 3 TIMES DAILY AS NEEDED FOR DIARRHEA.   levothyroxine (SYNTHROID) 75 MCG tablet Take 75 mcg by mouth daily before breakfast.    losartan (COZAAR) 25 MG tablet Take 25 mg by mouth daily.   nitroGLYCERIN (NITROSTAT) 0.4 MG SL tablet Place under the tongue.    Past Medical History:  Diagnosis Date   Arthritis    Colitis    Dry eye    [place]   HPV (human papilloma virus) infection    Hyperlipidemia     Ischemia of left lower extremity    Osteoporosis    Polymyalgia rheumatica (HCC)    Thyroid disease    Vaginal dysplasia     Past Surgical History:  Procedure Laterality Date   ABDOMINAL HYSTERECTOMY     complete   COLONOSCOPY WITH PROPOFOL N/A 10/04/2020   Procedure: COLONOSCOPY WITH PROPOFOL;  Surgeon: Toledo, Boykin Nearing, MD;  Location: [facility];  Service: Gastroenterology;  Laterality: N/A;   colposcobpy     COLPOSCOPY     CORONARY STENT INTERVENTION N/A 11/15/2020   Procedure: CORONARY STENT INTERVENTION;  Surgeon: Alwyn Pea, MD;  Location: [facility];  Service: Cardiovascular;  Laterality: N/A;   knee athroscopy Left    LAPAROSCOPIC HYSTERECTOMY     LEFT HEART CATH AND CORONARY ANGIOGRAPHY N/A 11/15/2020   Procedure: LEFT HEART CATH AND CORONARY ANGIOGRAPHY;  Surgeon: Alwyn Pea, MD;  Location: [facility];  Service: Cardiovascular;  Laterality: N/A;   LOWER EXTREMITY ANGIOGRAPHY Left 09/05/2020   Procedure: LOWER EXTREMITY ANGIOGRAPHY;  Surgeon: Renford Dills, MD;  Location: [facility];  Service: Cardiovascular;  Laterality: Left;   LOWER EXTREMITY ANGIOGRAPHY Left 04/10/2021   Procedure: LOWER EXTREMITY ANGIOGRAPHY;  Surgeon: Renford Dills, MD;  Location: [facility];  Service: Cardiovascular;  Laterality: Left;   LOWER EXTREMITY ANGIOGRAPHY Left 11/20/2021   Procedure: Lower Extremity Angiography;  Surgeon:

## 2022-04-18 ENCOUNTER — Ambulatory Visit (INDEPENDENT_AMBULATORY_CARE_PROVIDER_SITE_OTHER): Payer: Medicare Other | Admitting: Vascular Surgery

## 2022-04-18 ENCOUNTER — Other Ambulatory Visit (INDEPENDENT_AMBULATORY_CARE_PROVIDER_SITE_OTHER): Payer: Medicare Other

## 2022-04-18 ENCOUNTER — Encounter (INDEPENDENT_AMBULATORY_CARE_PROVIDER_SITE_OTHER): Payer: Medicare Other

## 2022-05-01 ENCOUNTER — Ambulatory Visit: Payer: Medicare Other

## 2022-05-03 ENCOUNTER — Telehealth (INDEPENDENT_AMBULATORY_CARE_PROVIDER_SITE_OTHER): Payer: Self-pay

## 2022-05-03 NOTE — Telephone Encounter (Signed)
Pt has an [place] scheduled Monday and is aware that Dr Gilda Crease will only see her if there is a blockage.  She states understanding.

## 2022-05-03 NOTE — Telephone Encounter (Signed)
I spoke with pt and she states she has not seen anyone.  This has happened many times and she knows the symptoms. Dr Gilda Crease normally brings her in right away and schedules an angio the next day.  I let her know I would speak with Sheppard Plumber NP and call her back.

## 2022-05-03 NOTE — Telephone Encounter (Signed)
Pt LVM stating that she has a popliteal artery blockage and needs an [place] and appt with Dr Gilda Crease.  [place].  She is hoping to been seen Monday afternoon.  Can someone call this pt and schedule her an appt.

## 2022-05-06 ENCOUNTER — Ambulatory Visit (INDEPENDENT_AMBULATORY_CARE_PROVIDER_SITE_OTHER): Payer: Medicare Other

## 2022-05-06 ENCOUNTER — Other Ambulatory Visit (INDEPENDENT_AMBULATORY_CARE_PROVIDER_SITE_OTHER): Payer: Self-pay | Admitting: Vascular Surgery

## 2022-05-06 DIAGNOSIS — Z9889 Other specified postprocedural states: Secondary | ICD-10-CM

## 2022-05-06 DIAGNOSIS — I739 Peripheral vascular disease, unspecified: Secondary | ICD-10-CM

## 2022-05-08 ENCOUNTER — Inpatient Hospital Stay: Payer: Medicare Other | Attending: Hematology and Oncology | Admitting: Obstetrics and Gynecology

## 2022-05-08 VITALS — BP 147/74 | HR 78 | Resp 18 | Ht 68.0 in | Wt 142.0 lb

## 2022-05-08 DIAGNOSIS — Z8249 Family history of ischemic heart disease and other diseases of the circulatory system: Secondary | ICD-10-CM | POA: Insufficient documentation

## 2022-05-08 DIAGNOSIS — Z79890 Hormone replacement therapy (postmenopausal): Secondary | ICD-10-CM | POA: Diagnosis not present

## 2022-05-08 DIAGNOSIS — Z800 Family history of malignant neoplasm of digestive organs: Secondary | ICD-10-CM | POA: Insufficient documentation

## 2022-05-08 DIAGNOSIS — E785 Hyperlipidemia, unspecified: Secondary | ICD-10-CM | POA: Diagnosis not present

## 2022-05-08 DIAGNOSIS — M25532 Pain in left wrist: Secondary | ICD-10-CM | POA: Diagnosis not present

## 2022-05-08 DIAGNOSIS — N893 Dysplasia of vagina, unspecified: Secondary | ICD-10-CM | POA: Insufficient documentation

## 2022-05-08 DIAGNOSIS — Z7901 Long term (current) use of anticoagulants: Secondary | ICD-10-CM | POA: Diagnosis not present

## 2022-05-08 DIAGNOSIS — Z7952 Long term (current) use of systemic steroids: Secondary | ICD-10-CM | POA: Insufficient documentation

## 2022-05-08 DIAGNOSIS — M62838 Other muscle spasm: Secondary | ICD-10-CM | POA: Diagnosis not present

## 2022-05-08 DIAGNOSIS — E034 Atrophy of thyroid (acquired): Secondary | ICD-10-CM | POA: Diagnosis not present

## 2022-05-08 DIAGNOSIS — M47812 Spondylosis without myelopathy or radiculopathy, cervical region: Secondary | ICD-10-CM | POA: Insufficient documentation

## 2022-05-08 DIAGNOSIS — I872 Venous insufficiency (chronic) (peripheral): Secondary | ICD-10-CM | POA: Insufficient documentation

## 2022-05-08 DIAGNOSIS — Z7902 Long term (current) use of antithrombotics/antiplatelets: Secondary | ICD-10-CM | POA: Diagnosis not present

## 2022-05-08 DIAGNOSIS — Z79899 Other long term (current) drug therapy: Secondary | ICD-10-CM | POA: Insufficient documentation

## 2022-05-08 DIAGNOSIS — I2510 Atherosclerotic heart disease of native coronary artery without angina pectoris: Secondary | ICD-10-CM | POA: Insufficient documentation

## 2022-05-08 DIAGNOSIS — Z90722 Acquired absence of ovaries, bilateral: Secondary | ICD-10-CM | POA: Insufficient documentation

## 2022-05-08 DIAGNOSIS — Z809 Family history of malignant neoplasm, unspecified: Secondary | ICD-10-CM | POA: Diagnosis not present

## 2022-05-08 MED ORDER — PREMARIN 0.625 MG/GM VA CREA: TOPICAL_CREAM | VAGINAL | 2 refills | Status: DC

## 2022-05-08 NOTE — Progress Notes (Signed)
GYN ONC Consult [facility]  [phone] [phone]  Patient Care Team: Mick Sell, MD as PCP - General (Infectious Diseases) Benita Gutter, RN as Oncology Nurse Navigator   Name of the patient: Melanie Browning  [id number]  01-Jun-1941   Date of visit: 05/08/2022  Gynecologic Oncology Interval Visit   Referring Provider: Dr. 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 She is s/p TAH-BSO on 09/05/2015, but final surgical path was negative for dysplasia.  After surgery, she was closely monitored every 6 months with colposcopies and PAPs.   She was last seen in clinic on 12/05/21.  PAP HSIL/HPV+ Colposcopy 3/23 with lesion in left fornix, but path did not show significant lesion.   VAGINAL FORNIX, LEFT; BIOPSY:  - 1 FRAGMENT OF ATYPICAL SQUAMOUS MUCOSA ASSOCIATED WITH POSSIBLE  PREVIOUS BIOPSY SITE REACTION.  - SEPARATE FRAGMENT OF ATROPHIC VAGINAL MUCOSA. No new complaints.   Biopsy 06/06/2021 to evaluate abnormal Pap  DIAGNOSIS:  A. VAGINA; BIOPSY:  - MINUTE FRAGMENT OF VAGINAL MUCOSA WITH PARTIALLY DETACHED EPITHELIUM  AND AT LEAST LOW-GRADE SQUAMOUS INTRAEPITHELIAL LESION (VAIN 1, MILD  DYSPLASIA).  - NEGATIVE FOR DEFINITE HIGH-GRADE SQUAMOUS INTRAEPITHELIAL LESION AND MALIGNANCY.   She presents today for follow up exam and PAP.   Gynecologic Oncology History Melanie Browning is a pleasant G1P1 female who is seen in consultation from Melanie Browning for dysplasia. She is s/p TAH-BSO on 09/05/2015, but final surgical path was negative for dysplasia.  After surgery, she was closely monitored every 6 months with colposcopies and PAPs. Prior treatment in [place] for cervical and vaginal dysplasia from 2016 to 08/2019.   9/16 LSIL PAP, HR HPV+, Colposcopic biopsies 10/16, CIN1 on ectocervix, ECC negative. 11/16  Cone biopsy LSIL, clear margins. ECC negative.  09/05/2015 TLH/BSO Melanie Browning in [facility] for abnormal PAP. No residual disease in specimen. PAP 08/2016 LGSIL  PAP 02/09/2017 HSIL Colposcopy 02/2017 VAIN 1 PAP 05/2017 NILM 10/30/2017 ASCUS, HPV 16+ Colposcopy 11/26/2017 VAIN 1 Colposcopy 03/17/2018 No obvious disease noted (as per note from Melanie Browning on 09/28/2019)  PAP 03/25/2019 LSIL/HPV+ Colposcopy with biopsy 04/20/2019 VAIN 1  PAP 09/21/2019, patient reports VAIN 1, HPV+ Colposcopy 7/21 normal PAP 10/25/20 HSIL, +HR HPV PAP 10/25/20 HSIL, -HR HPV  PAP 10/25/20 HSIL, +HR HPV Reported no gyn symptoms.   Colposcopy 2/22 negative We discussed options for management including surveillance versus Efudex treatment.  She was anxious and preferred treatment.  We prescribed Efudex once a week for 10 weeks and then RTC in 1-2 months for repeat PAP. She completed this course with some interruptions due to vulvar irritation despite skin protectants.   PAP 02/28/21 HSIL, HR HPV- No bleeding or discharge, slight odor.  Repeat Pap 04/18/21- ASC-H; cannot exclude high grade squamous intraepithelial.   Recommended she use Estradiol cream to improve health of vaginal mucosa prior to repeat colposcopy.    Other pertinent medical issues She had stent placed a few days ago in her leg due to artery narrowing.  On Plavix and ASA.  Small blood blister below right groin where vascular access was obtained. Bruises easily, but no bleeding.   She had colonoscopy for follow up of Lymphocytic colitis and that was stable.   Also having problems with intermittent claudication in left leg and underwent vascular percutaneous procedures including a popliteal stent in 12/21.  She  is taking Plavix and ASA.   Problem List: Patient Active Problem List   Diagnosis Date Noted   Muscle spasms of neck 04/06/2022   Cervical spondylosis 04/06/2022   Acute pain of left wrist 02/14/2022   Atherosclerosis of native  arteries of extremity with rest pain (HCC) 11/20/2021   CAD (coronary artery disease) 12/10/2020   Coronary artery disease involving native coronary artery of native heart 11/30/2020   S/P drug eluting coronary stent placement 11/15/2020   Abdominal pain, LLQ (left lower quadrant) 09/28/2020   Long term current use of anticoagulant therapy 09/28/2020   Functional diarrhea 09/28/2020   Chronic venous insufficiency 04/23/2020   PAD (peripheral artery disease) (HCC) 04/19/2020   Hyperlipidemia 04/19/2020   Hypothyroidism 04/19/2020   Popliteal artery injury, left, 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   Disruption of external operation (surgical) wound, not elsewhere classified, initial encounter 11/09/2019   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) 10/29/2019   Stricture, artery (HCC) 10/29/2019   Critical lower limb ischemia (HCC) 10/13/2019   Hypothyroidism due to acquired atrophy of thyroid 04/07/2019   Syncope and collapse 04/07/2019   Senile osteoporosis 09/20/2016   Sleep disorder 07/18/2016   IGT (impaired glucose tolerance) 06/26/2015   Pap smear abnormality of cervix with ASCUS favoring dysplasia 06/26/2015   Family history of Guillain-Barre syndrome 04/26/2015   History of herpes genitalis 04/26/2015   History of HPV infection 04/26/2015   Dyslipidemia 04/17/2015   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   PMR (polymyalgia rheumatica) (HCC) 04/17/2015   Arthritis of shoulder region, 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   Past Medical History: Past Medical History:  Diagnosis Date   Arthritis    Colitis    [place]   HPV (human papilloma virus) infection    Hyperlipidemia    Ischemia of left lower extremity    Osteoporosis    Polymyalgia rheumatica (HCC)    Thyroid disease    Vaginal dysplasia    Past Surgical History: Past Surgical History:  Procedure  Laterality Date   ABDOMINAL HYSTERECTOMY     complete   COLONOSCOPY WITH PROPOFOL N/A 10/04/2020   Procedure: COLONOSCOPY WITH PROPOFOL;  Surgeon: Browning, Melanie Nearing, MD;  Location: [facility] ENDOSCOPY;  Service: Gastroenterology;  Laterality: N/A;   colposcobpy     COLPOSCOPY     CORONARY STENT INTERVENTION N/A 11/15/2020   Procedure: CORONARY STENT INTERVENTION;  Surgeon: Alwyn Pea, MD;  Location: [facility] INVASIVE CV LAB;  Service: Cardiovascular;  Laterality: N/A;   knee athroscopy Left    LAPAROSCOPIC HYSTERECTOMY     LEFT HEART CATH AND CORONARY ANGIOGRAPHY N/A 11/15/2020   Procedure: LEFT HEART CATH AND CORONARY ANGIOGRAPHY;  Surgeon: Alwyn Pea, MD;  Location: [facility] INVASIVE CV LAB;  Service: Cardiovascular;  Laterality: N/A;   LOWER EXTREMITY ANGIOGRAPHY Left 09/05/2020   Procedure: LOWER EXTREMITY ANGIOGRAPHY;  Surgeon: Renford Dills, MD;  Location: [facility] INVASIVE CV LAB;  Service: Cardiovascular;  Laterality: Left;   LOWER EXTREMITY ANGIOGRAPHY Left 04/10/2021   Procedure: LOWER EXTREMITY ANGIOGRAPHY;  Surgeon: Renford Dills, MD;  Location: [facility] CV LAB;  Service: Cardiovascular;  Laterality: Left;   LOWER EXTREMITY ANGIOGRAPHY Left 11/20/2021   Procedure: Lower Extremity Angiography;  Surgeon: Renford Dills, MD;  Location: [facility] INVASIVE CV LAB;  Service: Cardiovascular;  Laterality: Left;   Past Gynecologic History:  Menarche: 14 Menstrual details: Lasts 3 days Menses regular:  yes Last Menstrual Period: Unknown History of OCP/HRT use: No History of Abnormal pap: yes,  as per HPI Last pap:  08/2019 History of STDs: The patient reports a past history of: herpes. Sexually active: not asked  OB History:  OB History  No obstetric history on file.   Family History: Family History  Problem Relation Age of Onset   Cancer Mother    Pancreatic cancer Mother    Heart disease Father    Social History: Social History   Socioeconomic History   Marital  status: Married    Spouse name: Norm    Number of children: 1   Years of education: Not on file   Highest education level: Not on file  Occupational History   Occupation: retired   Tobacco Use   Smoking status: Never   Smokeless tobacco: Never  Vaping Use   Vaping Use: Never used  Substance and Sexual Activity   Alcohol use: Not Currently    Alcohol/week: 3.0 standard drinks of alcohol    Types: 3 Glasses of wine per week    Comment: 3 glasses of wine per week    Drug use: Never   Sexual activity: Yes  Other Topics Concern   Not on file  Social History Narrative   Lives with spouse at [facility]    Social Determinants of Health   Financial Resource Strain: Not on file  Food Insecurity: Not on file  Transportation Needs: Not on file  Physical Activity: Not on file  Stress: Not on file  Social Connections: Not on file  Intimate Partner Violence: Not on file    Allergies: Allergies  Allergen Reactions   Lactose Other (See Comments)    Gi upset and diarrhea  Other reaction(s): Other (See Comments) Gi upset and diarrhea Gi upset and diarrhea Gi upset and diarrhea    Current Medications: Current Outpatient Medications  Medication Sig Dispense Refill   aspirin EC 81 MG tablet Take 81 mg by mouth every evening. Swallow whole.     atorvastatin (LIPITOR) 40 MG tablet Take 40 mg by mouth every evening.      budesonide (ENTOCORT EC) 3 MG 24 hr capsule Take 9 mg by mouth daily.     clopidogrel (PLAVIX) 75 MG tablet TAKE 1 TABLET BY MOUTH DAILY 30 tablet 5   diphenoxylate-atropine (LOMOTIL) 2.5-0.025 MG tablet TAKE 1 TABLET BY MOUTH 3 TIMES DAILY AS NEEDED FOR DIARRHEA. 90 tablet 0   levothyroxine (SYNTHROID) 75 MCG tablet Take 75 mcg by mouth daily before breakfast.      losartan (COZAAR) 25 MG tablet Take 25 mg by mouth daily.     nitroGLYCERIN (NITROSTAT) 0.4 MG SL tablet Place under the tongue.     amitriptyline (ELAVIL) 10 MG tablet TAKE 1 TABLET BY MOUTH NIGHTLY  (Patient not taking: Reported on 01/21/2022) 90 tablet 0   No current facility-administered medications for this visit.    Review of Systems General: no complaints  HEENT: no complaints  Lungs: no complaints  Cardiac: no complaints  GI: no complaints  GU: no complaints  Musculoskeletal: no complaints  Extremities: no complaints  Skin: no complaints  Neuro: no complaints  Endocrine: no complaints  Psych: no complaints      Objective:  Physical Examination:  BP (!) 147/74 (BP Location: Left Arm, Patient Position: Sitting)   Pulse 78   Resp 18   Ht 5\' 8"  (1.727 m)   Wt 142 lb (64.4 kg)   SpO2 99%  BMI 21.59 kg/m    ECOG Performance Status: 1 - Symptomatic but completely ambulatory  GENERAL: Patient is a well appearing female in no acute distress ABDOMEN:  Soft, nontender, nondistended. No ascites, masses or hernias.   EXTREMITIES:  No peripheral edema.   NEURO:  Nonfocal. Well oriented.  Appropriate affect.  Pelvic: chaperoned by CMA EGBUS: no lesions Cervix: surgically absent Vagina: atrophic, no discharge or bleeding, granulation in left fornix. Pap obtained Uterus: surgically absent BME: no palpable masses.     Assessment:  Meshawn Huie is a 81 y.o. female s/p TAH-BSO 09/05/2015 for dysplasia but no dysplasia in specimen.  Low grade PAP abnormalities since then with colposcopy negative.  PAP 12/20 VAIN1 HPV+.  Moved to this area and first seen 7/21 with normal colposcopy.  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  Colposcopy of vagina 2/22 with no lesions seen.   No symptoms. We discussed options for management including surveillance versus Efudex treatment.  She is anxious.and preferred treatment.  She used  Efudex once a week for 10 weeks with some interruption for vulvar irritation. PAP 02/28/21 HSIL, HR HPV-. Confirmed on [facility] review.  04/18/21- ASC-H; cannot exclude high grade squamous intraepithelial. On colposcopy 06/06/2021 with Lugol's small area of non staining biopsied, VAIN1.  PAP 3/23 showed HSIL/HPV+.  On colposcopy an area with punctation seen in the left fornix.  Biopsy left fornix 12/26/21 atypia only.    Vaginal atrophy.  Recent arterial stent with vascular for arterial narrowing in right popliteal area. Patient on Plavix and ASA.  Medical co-morbidities complicating care: PAD, Dyslipidemia  Plan:   Problem List Items Addressed This Visit       Genitourinary   Vaginal dysplasia - Primary    Repeat PAP today.  Will do a trial of vaginal estrogen in view of atrophy and abnormal PAPs, which may aid visualization.  If surgery for vaginectomy recommended may ask Melanie Doy Hutching to do this, as UroGyn much more comfortable with this dissection and avoidance of the bladder.   RTC 6 months unless PAP more abnormal.  The patient's diagnosis, an outline of the further diagnostic and laboratory studies which will be required, the recommendation, and alternatives were discussed.  All questions were answered to the patient's satisfaction.   Leida Lauth, MD

## 2022-05-08 NOTE — Patient Instructions (Signed)
You might want to speak to Dr. Sampson Goon about an antidepressant/antianxiety medication. Some of the ones we discussed are citalopram or sertraline. You might also discuss option of melatonin and/or restoril for sleep. I've sent a prescription for premarin to you pharmacy. Please let me know if you have any trouble with getting this prescription.

## 2022-05-14 ENCOUNTER — Encounter: Payer: Self-pay | Admitting: Nurse Practitioner

## 2022-05-26 ENCOUNTER — Encounter (INDEPENDENT_AMBULATORY_CARE_PROVIDER_SITE_OTHER): Payer: Self-pay | Admitting: Vascular Surgery

## 2022-05-27 ENCOUNTER — Other Ambulatory Visit (INDEPENDENT_AMBULATORY_CARE_PROVIDER_SITE_OTHER): Payer: Self-pay | Admitting: Vascular Surgery

## 2022-05-27 DIAGNOSIS — I739 Peripheral vascular disease, unspecified: Secondary | ICD-10-CM

## 2022-05-27 DIAGNOSIS — Z9889 Other specified postprocedural states: Secondary | ICD-10-CM

## 2022-05-27 IMAGING — CT CT HEART MORP W/ CTA COR W/ SCORE W/ CA W/CM &/OR W/O CM
1 of 14 series · 3 of 20 positions shown, 4 images · non-contrast
Comparison: None

Addendum:
CLINICAL DATA: Chest pain, hx of CAD

EXAM:
Cardiac/Coronary  CTA
TECHNIQUE: The patient was scanned on a Siemens Somatoform go.Top scanner.

[Series 44: ms multiphase cta coronary 0.60 · axial · 0.33mm/px · z∈[-1101,-1045]mm · 3 of 2538 slices shown, 4 images]
[im 635/2538  vessel]
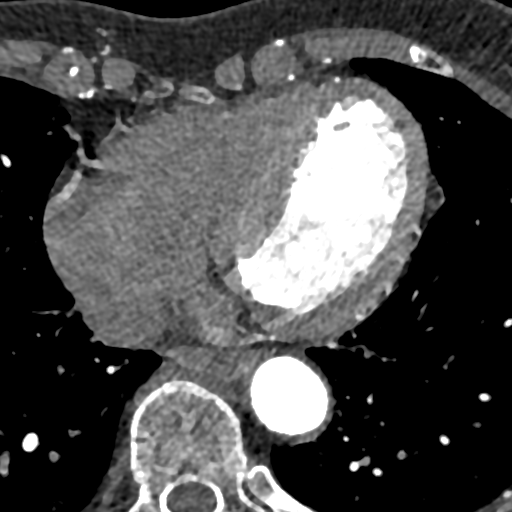
[im 635/2538  lung]
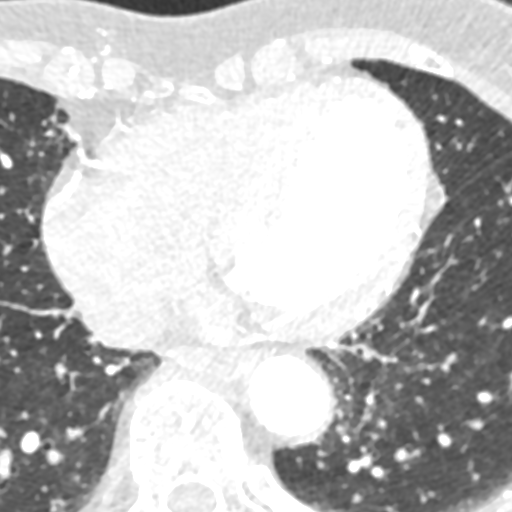
[im 1269/2538  vessel]
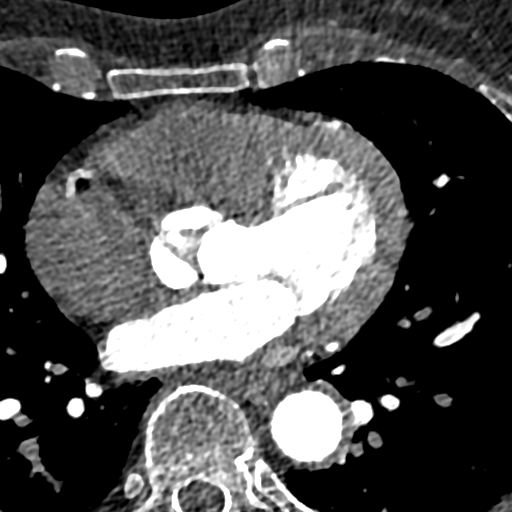
[im 1903/2538  vessel]
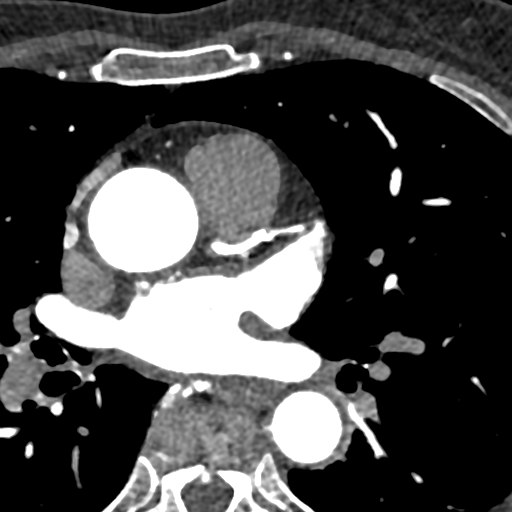

[3 of 20 positions shown; findings below may reference images not displayed]

FINDINGS: A retrospective scan was triggered in the descending thoracic aorta.
Axial non-contrast 3 mm slices were carried out through the heart.
The data set was analyzed on a dedicated work station and scored
using the Agatson method. Gantry rotation speed was 330 msecs and
collimation was .6 mm. 50mg of metoprolol and 0.8 mg of sl NTG was
given. The 3D data set was reconstructed in 5% intervals of the
60-95 % of the R-R cycle. Diastolic phases were analyzed on a
dedicated work station using MPR, MIP and VRT modes. The patient
received 75 cc of contrast.

Aorta: Normal size. Aortic root and descending aorta calcifications.
No dissection.

Aortic Valve:  Trileaflet.  mild calcifications present.

Coronary Arteries:  Normal coronary origin.  Right dominance.

RCA is a dominant artery that gives rise to PDA and PLA. There is
calcified plaque in the distal RCA causing minimal (<25%) stenosis.

Left main is a large artery that gives rise to LAD and LCX arteries.

LAD is heavily calcified in the proximal segment causing severe
stenosis (>70%)

LCX is a non-dominant artery that gives rise to one large OM1
branch. There is mild calcified plaque in the proximal LCx causing
minimal stenosis (<25%).

Other findings:

Normal pulmonary vein drainage into the left atrium.

Normal left atrial appendage without a thrombus.

Normal size of the pulmonary artery.
IMPRESSION: 1. Coronary calcium score of 539. This was 83rd percentile for age
and sex matched control.

2. Normal coronary origin with right dominance.

3. Calcified plaque in the proximal LAD causing severe stenosis.

4. Calcified plaque causing minimal stenosis in the distal RCA and
prox LCx.

5. CAD-RADS 4 Severe stenosis. (70-99% or > 50% left main). Cardiac
catheterization or CT FFR is recommended. Consider symptom-guided
anti-ischemic pharmacotherapy as well as risk factor modification
per guideline directed care.

6. Additional analysis with CT FFR will be submitted and reported
separately.

EXAM:
OVER-READ INTERPRETATION  CT CHEST

The following report is an over-read performed by radiologist Dr.
over-read does not include interpretation of cardiac or coronary
anatomy or pathology. The coronary CT angiography and calcium
scoring interpretation by the cardiologist is attached.
FINDINGS: Cardiovascular: See dedicated report for cardiac structures. Aortic
atherosclerosis of the descending thoracic aorta. Nonaneurysmal
caliber of the thoracic aorta.

Mediastinum/Nodes: No adenopathy in the mediastinum or gross hilar
adenopathy. Note that these areas are incompletely imaged on the
current study.

Lungs/Pleura: Atelectasis. Small nodule on image 3 of series 11 3 mm
to 4 mm size best seen on image 69 of series 13.

Upper Abdomen: No sign of hepatic steatosis. No acute upper
abdominal process on very limited assessment.

Musculoskeletal: No acute musculoskeletal finding or destructive
bone process on limited imaging.
IMPRESSION: 1. 3-4 mm LEFT upper lobe pulmonary nodule. No follow-up needed if
patient is low-risk. Non-contrast chest CT can be considered in 12
months if patient is high-risk. This recommendation follows the
consensus statement: Guidelines for Management of Incidental
Pulmonary Nodules Detected on CT Images: From the [HOSPITAL]
2. Aortic atherosclerosis.

Aortic Atherosclerosis (PPIVD-NSD.D).

*** End of Addendum ***
FINDINGS: A retrospective scan was triggered in the descending thoracic aorta.
Axial non-contrast 3 mm slices were carried out through the heart.
The data set was analyzed on a dedicated work station and scored
using the Agatson method. Gantry rotation speed was 330 msecs and
collimation was .6 mm. 50mg of metoprolol and 0.8 mg of sl NTG was
given. The 3D data set was reconstructed in 5% intervals of the
60-95 % of the R-R cycle. Diastolic phases were analyzed on a
dedicated work station using MPR, MIP and VRT modes. The patient
received 75 cc of contrast.

Aorta: Normal size. Aortic root and descending aorta calcifications.
No dissection.

Aortic Valve:  Trileaflet.  mild calcifications present.

Coronary Arteries:  Normal coronary origin.  Right dominance.

RCA is a dominant artery that gives rise to PDA and PLA. There is
calcified plaque in the distal RCA causing minimal (<25%) stenosis.

Left main is a large artery that gives rise to LAD and LCX arteries.

LAD is heavily calcified in the proximal segment causing severe
stenosis (>70%)

LCX is a non-dominant artery that gives rise to one large OM1
branch. There is mild calcified plaque in the proximal LCx causing
minimal stenosis (<25%).

Other findings:

Normal pulmonary vein drainage into the left atrium.

Normal left atrial appendage without a thrombus.

Normal size of the pulmonary artery.
IMPRESSION: 1. Coronary calcium score of 539. This was 83rd percentile for age
and sex matched control.

2. Normal coronary origin with right dominance.

3. Calcified plaque in the proximal LAD causing severe stenosis.

4. Calcified plaque causing minimal stenosis in the distal RCA and
prox LCx.

5. CAD-RADS 4 Severe stenosis. (70-99% or > 50% left main). Cardiac
catheterization or CT FFR is recommended. Consider symptom-guided
anti-ischemic pharmacotherapy as well as risk factor modification
per guideline directed care.

6. Additional analysis with CT FFR will be submitted and reported
separately.

## 2022-05-28 ENCOUNTER — Ambulatory Visit (INDEPENDENT_AMBULATORY_CARE_PROVIDER_SITE_OTHER): Payer: Medicare Other

## 2022-05-28 DIAGNOSIS — I739 Peripheral vascular disease, unspecified: Secondary | ICD-10-CM

## 2022-05-28 DIAGNOSIS — Z9889 Other specified postprocedural states: Secondary | ICD-10-CM | POA: Diagnosis not present

## 2022-05-29 ENCOUNTER — Encounter (INDEPENDENT_AMBULATORY_CARE_PROVIDER_SITE_OTHER): Payer: Self-pay | Admitting: Vascular Surgery

## 2022-05-31 ENCOUNTER — Telehealth (INDEPENDENT_AMBULATORY_CARE_PROVIDER_SITE_OTHER): Payer: Self-pay

## 2022-05-31 NOTE — Telephone Encounter (Signed)
Patient called back and was given the pre-procedure instructions were discussed and patient understood.

## 2022-05-31 NOTE — Telephone Encounter (Signed)
Per Sheppard Plumber NP, and Dr. Gilda Crease the patient has been scheduled for a left leg angio on 06/05/22 with a 10:15 am arrival time to the [facility]. I attempted to contact the patient and a message was left for a return call.

## 2022-06-05 ENCOUNTER — Encounter: Payer: Self-pay | Admitting: Vascular Surgery

## 2022-06-05 ENCOUNTER — Other Ambulatory Visit: Payer: Self-pay

## 2022-06-05 ENCOUNTER — Ambulatory Visit
Admission: RE | Admit: 2022-06-05 | Discharge: 2022-06-05 | Disposition: A | Discharge status: Home / Self Care | Payer: Medicare Other | Attending: Vascular Surgery | Admitting: Vascular Surgery

## 2022-06-05 ENCOUNTER — Encounter
Admission: RE | Disposition: A | Discharge status: Home / Self Care | Payer: Self-pay | Source: Home / Self Care | Attending: Vascular Surgery

## 2022-06-05 DIAGNOSIS — T82868A Thrombosis of vascular prosthetic devices, implants and grafts, initial encounter: Secondary | ICD-10-CM

## 2022-06-05 DIAGNOSIS — I70219 Atherosclerosis of native arteries of extremities with intermittent claudication, unspecified extremity: Secondary | ICD-10-CM

## 2022-06-05 DIAGNOSIS — I25118 Atherosclerotic heart disease of native coronary artery with other forms of angina pectoris: Secondary | ICD-10-CM | POA: Diagnosis not present

## 2022-06-05 DIAGNOSIS — E785 Hyperlipidemia, unspecified: Secondary | ICD-10-CM | POA: Diagnosis not present

## 2022-06-05 DIAGNOSIS — I70222 Atherosclerosis of native arteries of extremities with rest pain, left leg: Secondary | ICD-10-CM | POA: Insufficient documentation

## 2022-06-05 DIAGNOSIS — I872 Venous insufficiency (chronic) (peripheral): Secondary | ICD-10-CM | POA: Insufficient documentation

## 2022-06-05 DIAGNOSIS — T82856A Stenosis of peripheral vascular stent, initial encounter: Secondary | ICD-10-CM | POA: Diagnosis not present

## 2022-06-05 HISTORY — PX: LOWER EXTREMITY ANGIOGRAPHY: CATH118251

## 2022-06-05 LAB — BUN: BUN: 17 mg/dL (ref 8–23)

## 2022-06-05 LAB — CREATININE, SERUM
Creatinine, Ser: 0.59 mg/dL (ref 0.44–1.00)
GFR, Estimated: >60 mL/min (ref >60)

## 2022-06-05 SURGERY — LOWER EXTREMITY ANGIOGRAPHY
Anesthesia: Moderate Sedation | Site: Leg Lower | Laterality: Left

## 2022-06-05 MED ORDER — METHYLPREDNISOLONE SODIUM SUCC 125 MG IJ SOLR
125.0000 mg | Freq: Once | INTRAMUSCULAR | Status: DC | PRN
Start: 2022-06-05 — End: 2022-06-05

## 2022-06-05 MED ORDER — TICAGRELOR 90 MG PO TABS: 90.0000 mg | ORAL_TABLET | Freq: Two times a day (BID) | ORAL | 6 refills | Status: DC

## 2022-06-05 MED ORDER — FENTANYL CITRATE (PF) 100 MCG/2ML IJ SOLN
INTRAMUSCULAR | Status: AC
  Filled 2022-06-05: qty 2

## 2022-06-05 MED ORDER — FENTANYL CITRATE PF 50 MCG/ML IJ SOSY
PREFILLED_SYRINGE | INTRAMUSCULAR | Status: AC
  Filled 2022-06-05: qty 1

## 2022-06-05 MED ORDER — TICAGRELOR 90 MG PO TABS
180.0000 mg | ORAL_TABLET | ORAL | Status: AC
  Administered 2022-06-05: 180 mg via ORAL

## 2022-06-05 MED ORDER — HYDROMORPHONE HCL 1 MG/ML IJ SOLN: 1.0000 mg | Freq: Once | INTRAMUSCULAR | Status: DC | PRN

## 2022-06-05 MED ORDER — DIPHENHYDRAMINE HCL 50 MG/ML IJ SOLN: 50.0000 mg | Freq: Once | INTRAMUSCULAR | Status: DC | PRN

## 2022-06-05 MED ORDER — MIDAZOLAM HCL 2 MG/2ML IJ SOLN
INTRAMUSCULAR | Status: AC
  Filled 2022-06-05: qty 2

## 2022-06-05 MED ORDER — NITROGLYCERIN 1 MG/10 ML FOR IR/CATH LAB
INTRA_ARTERIAL | Status: DC | PRN
  Administered 2022-06-05: 400 ug via INTRA_ARTERIAL

## 2022-06-05 MED ORDER — ONDANSETRON HCL 4 MG/2ML IJ SOLN: 4.0000 mg | Freq: Four times a day (QID) | INTRAMUSCULAR | Status: DC | PRN

## 2022-06-05 MED ORDER — HYDRALAZINE HCL 20 MG/ML IJ SOLN: 5.0000 mg | INTRAMUSCULAR | Status: DC | PRN

## 2022-06-05 MED ORDER — CEFAZOLIN SODIUM-DEXTROSE 2-4 GM/100ML-% IV SOLN: 2.0000 g | INTRAVENOUS | Status: AC

## 2022-06-05 MED ORDER — SODIUM CHLORIDE 0.9% FLUSH: 3.0000 mL | INTRAVENOUS | Status: DC | PRN

## 2022-06-05 MED ORDER — ONDANSETRON HCL 4 MG/2ML IJ SOLN
4.0000 mg | Freq: Four times a day (QID) | INTRAMUSCULAR | Status: DC | PRN
Start: 2022-06-05 — End: 2022-06-05

## 2022-06-05 MED ORDER — TICAGRELOR 90 MG PO TABS
ORAL_TABLET | ORAL | Status: AC
  Filled 2022-06-05: qty 2

## 2022-06-05 MED ORDER — OXYCODONE HCL 5 MG PO TABS: 5.0000 mg | ORAL_TABLET | ORAL | Status: DC | PRN

## 2022-06-05 MED ORDER — SODIUM CHLORIDE 0.9 % IV SOLN: INTRAVENOUS | Status: DC

## 2022-06-05 MED ORDER — NITROGLYCERIN 1 MG/10 ML FOR IR/CATH LAB
INTRA_ARTERIAL | Status: AC
  Filled 2022-06-05: qty 10

## 2022-06-05 MED ORDER — CEFAZOLIN SODIUM-DEXTROSE 2-4 GM/100ML-% IV SOLN
INTRAVENOUS | Status: AC
  Administered 2022-06-05: 2 g via INTRAVENOUS
  Filled 2022-06-05: qty 100

## 2022-06-05 MED ORDER — FAMOTIDINE 20 MG PO TABS: 40.0000 mg | ORAL_TABLET | Freq: Once | ORAL | Status: DC | PRN

## 2022-06-05 MED ORDER — MORPHINE SULFATE (PF) 4 MG/ML IV SOLN: 2.0000 mg | INTRAVENOUS | Status: DC | PRN

## 2022-06-05 MED ORDER — HEPARIN SODIUM (PORCINE) 1000 UNIT/ML IJ SOLN
INTRAMUSCULAR | Status: DC | PRN
  Administered 2022-06-05: 5000 [IU] via INTRAVENOUS

## 2022-06-05 MED ORDER — MIDAZOLAM HCL 2 MG/2ML IJ SOLN
INTRAMUSCULAR | Status: DC | PRN
  Administered 2022-06-05 (×2): 1 mg via INTRAVENOUS
  Administered 2022-06-05: 2 mg via INTRAVENOUS
  Administered 2022-06-05: 1 mg via INTRAVENOUS

## 2022-06-05 MED ORDER — HEPARIN SODIUM (PORCINE) 1000 UNIT/ML IJ SOLN
INTRAMUSCULAR | Status: AC
  Filled 2022-06-05: qty 10

## 2022-06-05 MED ORDER — SODIUM CHLORIDE 0.9 % IV SOLN: 250.0000 mL | INTRAVENOUS | Status: DC | PRN

## 2022-06-05 MED ORDER — IODIXANOL 320 MG/ML IV SOLN
INTRAVENOUS | Status: DC | PRN
  Administered 2022-06-05: 55 mL via INTRA_ARTERIAL

## 2022-06-05 MED ORDER — MIDAZOLAM HCL 2 MG/ML PO SYRP: 8.0000 mg | ORAL_SOLUTION | Freq: Once | ORAL | Status: DC | PRN

## 2022-06-05 MED ORDER — FENTANYL CITRATE (PF) 100 MCG/2ML IJ SOLN
INTRAMUSCULAR | Status: DC | PRN
  Administered 2022-06-05: 50 ug via INTRAVENOUS
  Administered 2022-06-05: 25 ug via INTRAVENOUS
  Administered 2022-06-05: 50 ug via INTRAVENOUS
  Administered 2022-06-05: 25 ug via INTRAVENOUS

## 2022-06-05 MED ORDER — LABETALOL HCL 5 MG/ML IV SOLN: 10.0000 mg | INTRAVENOUS | Status: DC | PRN

## 2022-06-05 MED ORDER — ALTEPLASE 2 MG IJ SOLR
INTRAMUSCULAR | Status: AC
  Filled 2022-06-05: qty 6

## 2022-06-05 MED ORDER — ACETAMINOPHEN 325 MG PO TABS: 650.0000 mg | ORAL_TABLET | ORAL | Status: DC | PRN

## 2022-06-05 MED ORDER — SODIUM CHLORIDE 0.9% FLUSH: 3.0000 mL | Freq: Two times a day (BID) | INTRAVENOUS | Status: DC

## 2022-06-05 SURGICAL SUPPLY — 30 items
BALLN LUTONIX  018 4X60X130 (BALLOONS) ×1
BALLN LUTONIX 018 4X40X130 (BALLOONS) ×1
BALLN LUTONIX 018 4X60X130 (BALLOONS) ×1
BALLN LUTONIX 018 4X80X130 (BALLOONS) ×1
BALLN LUTONIX 018 5X220X130 (BALLOONS) ×2
BALLN ULTRASCORE 014 3X100X150 (BALLOONS) ×1
BALLOON LUTONIX 018 4X40X130 (BALLOONS) IMPLANT
BALLOON LUTONIX 018 4X60X130 (BALLOONS) IMPLANT
BALLOON LUTONIX 018 4X80X130 (BALLOONS) IMPLANT
BALLOON LUTONIX 018 5X220X130 (BALLOONS) IMPLANT
BALLOON ULTRSCRE 014 3X100X150 (BALLOONS) IMPLANT
CATH ANGIO 5F PIGTAIL 65CM (CATHETERS) IMPLANT
CATH ROTAREX 135 6FR (CATHETERS) IMPLANT
CATH VERT 5FR 125CM (CATHETERS) IMPLANT
COVER PROBE U/S 5X48 (MISCELLANEOUS) IMPLANT
DEVICE STARCLOSE SE CLOSURE (Vascular Products) IMPLANT
GLIDEWIRE ADV .035X260CM (WIRE) IMPLANT
KIT ENCORE 26 ADVANTAGE (KITS) IMPLANT
NDL ENTRY 21GA 7CM ECHOTIP (NEEDLE) IMPLANT
NEEDLE ENTRY 21GA 7CM ECHOTIP (NEEDLE) ×1 IMPLANT
PACK ANGIOGRAPHY (CUSTOM PROCEDURE TRAY) ×1 IMPLANT
SET INTRO CAPELLA COAXIAL (SET/KITS/TRAYS/PACK) IMPLANT
SHEATH BRITE TIP 5FRX11 (SHEATH) IMPLANT
SHEATH NEURON MAX 6FR 80CM (SHEATH) IMPLANT
STENT LIFESTENT 5F 6X60X135 (Permanent Stent) IMPLANT
SYR MEDRAD MARK 7 150ML (SYRINGE) IMPLANT
TUBING CONTRAST HIGH PRESS 72 (TUBING) IMPLANT
WIRE G V18X300CM (WIRE) IMPLANT
WIRE GUIDERIGHT .035X150 (WIRE) IMPLANT
WIRE RUNTHROUGH .014X300CM (WIRE) IMPLANT

## 2022-06-05 NOTE — Progress Notes (Signed)
WBC 6.5 04/11/2021   HGB 12.2 04/11/2021   HCT 36.6 04/11/2021   MCV 97.9 04/11/2021   PLT 167 04/11/2021    BMET    Component Value Date/Time   BUN 18 11/20/2021 1505   CREATININE 0.63 11/20/2021 1505   GFRNONAA >60 11/20/2021 1505   CrCl cannot be calculated (Patient's most recent lab result is older than the maximum 21 days allowed.).  COAG No results found for: "INR", "PROTIME"  Radiology VAS [place] ABI WITH/WO TBI  Result Date: 05/30/2022  LOWER EXTREMITY DOPPLER STUDY Patient Name:  Melanie Browning  Date of Exam:   05/28/2022 Medical Rec #: [id number]      Accession #:    [id number] Date of Birth: Aug 11, 1941       Patient Gender: F Patient Age:   81 years Exam Location:  [facility] Procedure:      VAS [place] ABI WITH/WO TBI Referring Phys: [facility] --------------------------------------------------------------------------------  Indications: Peripheral artery disease.  Vascular Interventions: 10/13/2019: Left popliteal artery PTA;                         10/14/2019: Left popliteal artery reconstruction;                          02/03/2020: Left popliteal artery angioplasty;                         04/10/2021: Left distal SFA/Popliteal Artery                         thrombectomy/PTA/stent;                          11/20/2021: PTA and Stent placement left SFA and                         Popliteal Artery. PTA Left tibioperoneal trunk and                         Posterior Tibial Artery. Comparison Study: 04/08/2022 Performing Technologist: Salvadore Farber RVT  Examination Guidelines: A complete evaluation includes at minimum, Doppler waveform signals and systolic blood pressure reading at the level of bilateral brachial, anterior tibial, and posterior tibial arteries, when vessel segments are accessible. Bilateral testing is considered an integral part of a complete examination. Photoelectric Plethysmograph (PPG) waveforms and toe systolic pressure readings are included as required and additional duplex testing as needed. Limited examinations for reoccurring indications may be performed as noted.  ABI Findings: +--------+------------------+-----+---------+--------+ Right   Rt Pressure (mmHg)IndexWaveform Comment  +--------+------------------+-----+---------+--------+ [id number]                                      +--------+------------------+-----+---------+--------+ PTA     160               1.10 triphasic         +--------+------------------+-----+---------+--------+ +--------+------------------+-----+-------------------+-------+ Left    Lt Pressure (mmHg)IndexWaveform           Comment +--------+------------------+-----+-------------------+-------+ [id number]                                               +--------+------------------+-----+-------------------+-------+  MRN : [id number]  Melanie Browning is a 81 y.o. ([birth year]) female who presents with chief complaint of check circulation.  History of Present Illness:   Patient presents to [facility] today for treatment of her occluded left popliteal stent.  The patient notes that there has been a significant deterioration in the lower extremity symptoms.  The patient notes interval shortening of their claudication distance and development of mild rest pain symptoms. No new ulcers or wounds have occurred since the last visit.  There have been no significant changes to the patient's overall health care.  The patient denies amaurosis fugax or recent TIA symptoms. There are no recent neurological changes noted. There is no history of DVT, PE or superficial thrombophlebitis. The patient denies recent episodes of angina or shortness of breath.   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  Previous duplex ultrasound dated 04/08/2022 demonstrated a widely patent left popliteal artery stent with laminar flow and normal velocities throughout.  Current Meds  Medication Sig   aspirin EC 81 MG tablet Take 81 mg by mouth every evening. Swallow whole.   atorvastatin (LIPITOR) 40 MG tablet Take 40 mg by mouth every evening.    budesonide (ENTOCORT EC) 3 MG 24 hr capsule Take 9 mg by mouth daily.   clopidogrel (PLAVIX) 75 MG tablet TAKE 1 TABLET BY MOUTH DAILY   conjugated estrogens (PREMARIN) vaginal cream Insert 1/4 applicator full in to the vagina at night twice weekly.   diphenoxylate-atropine (LOMOTIL) 2.5-0.025 MG tablet TAKE 1 TABLET BY MOUTH 3 TIMES DAILY AS NEEDED FOR DIARRHEA.   levothyroxine (SYNTHROID) 75 MCG tablet Take 75 mcg by mouth daily before breakfast.    losartan (COZAAR) 25 MG tablet Take  25 mg by mouth daily.    Past Medical History:  Diagnosis Date   Arthritis    Colitis    Dry eye    bilateral when living in [place]   HPV (human papilloma virus) infection    Hyperlipidemia    Ischemia of left lower extremity    Osteoporosis    Polymyalgia rheumatica (HCC)    Thyroid disease    Vaginal dysplasia     Past Surgical History:  Procedure Laterality Date   ABDOMINAL HYSTERECTOMY     complete   COLONOSCOPY WITH PROPOFOL N/A 10/04/2020   Procedure: COLONOSCOPY WITH PROPOFOL;  Surgeon: Toledo, Benay Pike, MD;  Location: [facility];  Service: Gastroenterology;  Laterality: N/A;   colposcobpy     COLPOSCOPY     CORONARY STENT INTERVENTION N/A 11/15/2020   Procedure: CORONARY STENT INTERVENTION;  Surgeon: Yolonda Kida, MD;  Location: [place] [facility];  Service: Cardiovascular;  Laterality: N/A;   knee athroscopy Left    LAPAROSCOPIC HYSTERECTOMY     LEFT HEART CATH AND CORONARY ANGIOGRAPHY N/A 11/15/2020   Procedure: LEFT HEART CATH AND CORONARY ANGIOGRAPHY;  Surgeon: Yolonda Kida, MD;  Location: [place] [facility];  Service: Cardiovascular;  Laterality: N/A;   LOWER EXTREMITY ANGIOGRAPHY Left 09/05/2020   Procedure: LOWER EXTREMITY ANGIOGRAPHY;  Surgeon: Katha Cabal, MD;  Location: [place] [facility];  Service: Cardiovascular;  Laterality: Left;   LOWER EXTREMITY ANGIOGRAPHY Left 04/10/2021   Procedure: LOWER EXTREMITY ANGIOGRAPHY;  Surgeon: Katha Cabal, MD;  PTA     78                0.54 dampened monophasic        +--------+------------------+-----+-------------------+-------+ +-------+-----------+-----------+------------+------------+ ABI/TBIToday's ABIToday's TBIPrevious ABIPrevious TBI  +-------+-----------+-----------+------------+------------+ Right  1.10                  1.20                     +-------+-----------+-----------+------------+------------+ Left   .54                   1.15                     +-------+-----------+-----------+------------+------------+  Left ABIs appear decreased compared to prior study on 04/08/2022.  Summary: Left: Resting left ankle-brachial index indicates moderate left lower extremity arterial disease. Left duplex shows SFA/Pop stents are occluded. *See table(s) above for measurements and observations.  Electronically signed by Leotis Pain MD on 05/30/2022 at 10:56:42 AM.    Final    VAS [place] LOWER EXTREMITY ARTERIAL DUPLEX  Result Date: 05/09/2022 LOWER EXTREMITY ARTERIAL DUPLEX STUDY Patient Name:  Melanie Browning  Date of Exam:   05/06/2022 Medical Rec #: [id number]      Accession #:    [id number] Date of Birth: 1941-07-22       Patient Gender: F Patient Age:   23 years Exam Location:  [facility] Procedure:      VAS [place] LOWER EXTREMITY ARTERIAL DUPLEX Referring Phys: [name] --------------------------------------------------------------------------------  Indications: Claudication.  Current ABI: n/a Comparison Study: 04/08/2022 Performing Technologist: Concha Norway RVT  Examination Guidelines: A complete evaluation includes B-mode imaging, spectral Doppler, color Doppler, and power Doppler as needed of all accessible portions of each vessel. Bilateral testing is considered an integral part of a complete examination. Limited examinations for reoccurring indications may be performed as noted.   +-----------+--------+-----+--------+----------+--------+ LEFT       PSV cm/sRatioStenosisWaveform  Comments +-----------+--------+-----+--------+----------+--------+ CFA Mid    59                   biphasic           +-----------+--------+-----+--------+----------+--------+ DFA        85                   biphasic            +-----------+--------+-----+--------+----------+--------+ SFA Prox   35                   biphasic           +-----------+--------+-----+--------+----------+--------+ SFA Mid    37                   monophasic         +-----------+--------+-----+--------+----------+--------+ SFA Distal 66                   monophasicstent    +-----------+--------+-----+--------+----------+--------+ POP Prox   18                   monophasicstent    +-----------+--------+-----+--------+----------+--------+ POP Mid    46                   monophasicstent    +-----------+--------+-----+--------+----------+--------+ POP Distal 46                   monophasicstent    +-----------+--------+-----+--------+----------+--------+ ATA  WBC 6.5 04/11/2021   HGB 12.2 04/11/2021   HCT 36.6 04/11/2021   MCV 97.9 04/11/2021   PLT 167 04/11/2021    BMET    Component Value Date/Time   BUN 18 11/20/2021 1505   CREATININE 0.63 11/20/2021 1505   GFRNONAA >60 11/20/2021 1505   CrCl cannot be calculated (Patient's most recent lab result is older than the maximum 21 days allowed.).  COAG No results found for: "INR", "PROTIME"  Radiology VAS [place] ABI WITH/WO TBI  Result Date: 05/30/2022  LOWER EXTREMITY DOPPLER STUDY Patient Name:  Melanie Browning  Date of Exam:   05/28/2022 Medical Rec #: [id number]      Accession #:    [id number] Date of Birth: Aug 11, 1941       Patient Gender: F Patient Age:   81 years Exam Location:  [facility] Procedure:      VAS [place] ABI WITH/WO TBI Referring Phys: [facility] --------------------------------------------------------------------------------  Indications: Peripheral artery disease.  Vascular Interventions: 10/13/2019: Left popliteal artery PTA;                         10/14/2019: Left popliteal artery reconstruction;                          02/03/2020: Left popliteal artery angioplasty;                         04/10/2021: Left distal SFA/Popliteal Artery                         thrombectomy/PTA/stent;                          11/20/2021: PTA and Stent placement left SFA and                         Popliteal Artery. PTA Left tibioperoneal trunk and                         Posterior Tibial Artery. Comparison Study: 04/08/2022 Performing Technologist: Salvadore Farber RVT  Examination Guidelines: A complete evaluation includes at minimum, Doppler waveform signals and systolic blood pressure reading at the level of bilateral brachial, anterior tibial, and posterior tibial arteries, when vessel segments are accessible. Bilateral testing is considered an integral part of a complete examination. Photoelectric Plethysmograph (PPG) waveforms and toe systolic pressure readings are included as required and additional duplex testing as needed. Limited examinations for reoccurring indications may be performed as noted.  ABI Findings: +--------+------------------+-----+---------+--------+ Right   Rt Pressure (mmHg)IndexWaveform Comment  +--------+------------------+-----+---------+--------+ [id number]                                      +--------+------------------+-----+---------+--------+ PTA     160               1.10 triphasic         +--------+------------------+-----+---------+--------+ +--------+------------------+-----+-------------------+-------+ Left    Lt Pressure (mmHg)IndexWaveform           Comment +--------+------------------+-----+-------------------+-------+ [id number]                                               +--------+------------------+-----+-------------------+-------+  PTA     78                0.54 dampened monophasic        +--------+------------------+-----+-------------------+-------+ +-------+-----------+-----------+------------+------------+ ABI/TBIToday's ABIToday's TBIPrevious ABIPrevious TBI  +-------+-----------+-----------+------------+------------+ Right  1.10                  1.20                     +-------+-----------+-----------+------------+------------+ Left   .54                   1.15                     +-------+-----------+-----------+------------+------------+  Left ABIs appear decreased compared to prior study on 04/08/2022.  Summary: Left: Resting left ankle-brachial index indicates moderate left lower extremity arterial disease. Left duplex shows SFA/Pop stents are occluded. *See table(s) above for measurements and observations.  Electronically signed by Leotis Pain MD on 05/30/2022 at 10:56:42 AM.    Final    VAS [place] LOWER EXTREMITY ARTERIAL DUPLEX  Result Date: 05/09/2022 LOWER EXTREMITY ARTERIAL DUPLEX STUDY Patient Name:  Melanie Browning  Date of Exam:   05/06/2022 Medical Rec #: [id number]      Accession #:    [id number] Date of Birth: 1941-07-22       Patient Gender: F Patient Age:   23 years Exam Location:  [facility] Procedure:      VAS [place] LOWER EXTREMITY ARTERIAL DUPLEX Referring Phys: [name] --------------------------------------------------------------------------------  Indications: Claudication.  Current ABI: n/a Comparison Study: 04/08/2022 Performing Technologist: Concha Norway RVT  Examination Guidelines: A complete evaluation includes B-mode imaging, spectral Doppler, color Doppler, and power Doppler as needed of all accessible portions of each vessel. Bilateral testing is considered an integral part of a complete examination. Limited examinations for reoccurring indications may be performed as noted.   +-----------+--------+-----+--------+----------+--------+ LEFT       PSV cm/sRatioStenosisWaveform  Comments +-----------+--------+-----+--------+----------+--------+ CFA Mid    59                   biphasic           +-----------+--------+-----+--------+----------+--------+ DFA        85                   biphasic            +-----------+--------+-----+--------+----------+--------+ SFA Prox   35                   biphasic           +-----------+--------+-----+--------+----------+--------+ SFA Mid    37                   monophasic         +-----------+--------+-----+--------+----------+--------+ SFA Distal 66                   monophasicstent    +-----------+--------+-----+--------+----------+--------+ POP Prox   18                   monophasicstent    +-----------+--------+-----+--------+----------+--------+ POP Mid    46                   monophasicstent    +-----------+--------+-----+--------+----------+--------+ POP Distal 46                   monophasicstent    +-----------+--------+-----+--------+----------+--------+ ATA

## 2022-06-05 NOTE — Interval H&P Note (Signed)
History and Physical Interval Note:  06/05/2022 11:21 AM  Melanie Browning  has presented today for surgery, with the diagnosis of LLE Angiography   ASO with claudication   BARD Rep.  The various methods of treatment have been discussed with the patient and family. After consideration of risks, benefits and other options for treatment, the patient has consented to  Procedure(s): Lower Extremity Angiography (Left) as a surgical intervention.  The patient's history has been reviewed, patient examined, no change in status, stable for surgery.  I have reviewed the patient's chart and labs.  Questions were answered to the patient's satisfaction.     Levora Dredge

## 2022-06-05 NOTE — H&P (View-Only) (Signed)
MRN : [id number]  Melanie Browning is a 81 y.o. (1941/04/16) female who presents with chief complaint of check circulation.  History of Present Illness:   Patient presents to [facility] today for treatment of her occluded left popliteal stent.  The patient notes that there has been a significant deterioration in the lower extremity symptoms.  The patient notes interval shortening of their claudication distance and development of mild rest pain symptoms. No new ulcers or wounds have occurred since the last visit.  There have been no significant changes to the patient's overall health care.  The patient denies amaurosis fugax or recent TIA symptoms. There are no recent neurological changes noted. There is no history of DVT, PE or superficial thrombophlebitis. The patient denies recent episodes of angina or shortness of breath.   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  Previous duplex ultrasound dated 04/08/2022 demonstrated a widely patent left popliteal artery stent with laminar flow and normal velocities throughout.  Current Meds  Medication Sig   aspirin EC 81 MG tablet Take 81 mg by mouth every evening. Swallow whole.   atorvastatin (LIPITOR) 40 MG tablet Take 40 mg by mouth every evening.    budesonide (ENTOCORT EC) 3 MG 24 hr capsule Take 9 mg by mouth daily.   clopidogrel (PLAVIX) 75 MG tablet TAKE 1 TABLET BY MOUTH DAILY   conjugated estrogens (PREMARIN) vaginal cream Insert 1/4 applicator full in to the vagina at night twice weekly.   diphenoxylate-atropine (LOMOTIL) 2.5-0.025 MG tablet TAKE 1 TABLET BY MOUTH 3 TIMES DAILY AS NEEDED FOR DIARRHEA.   levothyroxine (SYNTHROID) 75 MCG tablet Take 75 mcg by mouth daily before breakfast.    losartan (COZAAR) 25 MG tablet Take  25 mg by mouth daily.    Past Medical History:  Diagnosis Date   Arthritis    Colitis    Dry eye    bilateral when living in [place]   HPV (human papilloma virus) infection    Hyperlipidemia    Ischemia of left lower extremity    Osteoporosis    Polymyalgia rheumatica (HCC)    Thyroid disease    Vaginal dysplasia     Past Surgical History:  Procedure Laterality Date   ABDOMINAL HYSTERECTOMY     complete   COLONOSCOPY WITH PROPOFOL N/A 10/04/2020   Procedure: COLONOSCOPY WITH PROPOFOL;  Surgeon: Toledo, Benay Pike, MD;  Location: [facility];  Service: Gastroenterology;  Laterality: N/A;   colposcobpy     COLPOSCOPY     CORONARY STENT INTERVENTION N/A 11/15/2020   Procedure: CORONARY STENT INTERVENTION;  Surgeon: Yolonda Kida, MD;  Location: [facility];  Service: Cardiovascular;  Laterality: N/A;   knee athroscopy Left    LAPAROSCOPIC HYSTERECTOMY     LEFT HEART CATH AND CORONARY ANGIOGRAPHY N/A 11/15/2020   Procedure: LEFT HEART CATH AND CORONARY ANGIOGRAPHY;  Surgeon: Yolonda Kida, MD;  Location: [facility];  Service: Cardiovascular;  Laterality: N/A;   LOWER EXTREMITY ANGIOGRAPHY Left 09/05/2020   Procedure: LOWER EXTREMITY ANGIOGRAPHY;  Surgeon: Katha Cabal, MD;  Location: [facility];  Service: Cardiovascular;  Laterality: Left;   LOWER EXTREMITY ANGIOGRAPHY Left 04/10/2021   Procedure: LOWER EXTREMITY ANGIOGRAPHY;  Surgeon: Katha Cabal, MD;  MRN : [id number]  Melanie Browning is a 81 y.o. (1941/04/16) female who presents with chief complaint of check circulation.  History of Present Illness:   Patient presents to [facility] today for treatment of her occluded left popliteal stent.  The patient notes that there has been a significant deterioration in the lower extremity symptoms.  The patient notes interval shortening of their claudication distance and development of mild rest pain symptoms. No new ulcers or wounds have occurred since the last visit.  There have been no significant changes to the patient's overall health care.  The patient denies amaurosis fugax or recent TIA symptoms. There are no recent neurological changes noted. There is no history of DVT, PE or superficial thrombophlebitis. The patient denies recent episodes of angina or shortness of breath.   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  Previous duplex ultrasound dated 04/08/2022 demonstrated a widely patent left popliteal artery stent with laminar flow and normal velocities throughout.  Current Meds  Medication Sig   aspirin EC 81 MG tablet Take 81 mg by mouth every evening. Swallow whole.   atorvastatin (LIPITOR) 40 MG tablet Take 40 mg by mouth every evening.    budesonide (ENTOCORT EC) 3 MG 24 hr capsule Take 9 mg by mouth daily.   clopidogrel (PLAVIX) 75 MG tablet TAKE 1 TABLET BY MOUTH DAILY   conjugated estrogens (PREMARIN) vaginal cream Insert 1/4 applicator full in to the vagina at night twice weekly.   diphenoxylate-atropine (LOMOTIL) 2.5-0.025 MG tablet TAKE 1 TABLET BY MOUTH 3 TIMES DAILY AS NEEDED FOR DIARRHEA.   levothyroxine (SYNTHROID) 75 MCG tablet Take 75 mcg by mouth daily before breakfast.    losartan (COZAAR) 25 MG tablet Take  25 mg by mouth daily.    Past Medical History:  Diagnosis Date   Arthritis    Colitis    Dry eye    bilateral when living in [place]   HPV (human papilloma virus) infection    Hyperlipidemia    Ischemia of left lower extremity    Osteoporosis    Polymyalgia rheumatica (HCC)    Thyroid disease    Vaginal dysplasia     Past Surgical History:  Procedure Laterality Date   ABDOMINAL HYSTERECTOMY     complete   COLONOSCOPY WITH PROPOFOL N/A 10/04/2020   Procedure: COLONOSCOPY WITH PROPOFOL;  Surgeon: Toledo, Benay Pike, MD;  Location: [facility];  Service: Gastroenterology;  Laterality: N/A;   colposcobpy     COLPOSCOPY     CORONARY STENT INTERVENTION N/A 11/15/2020   Procedure: CORONARY STENT INTERVENTION;  Surgeon: Yolonda Kida, MD;  Location: [facility];  Service: Cardiovascular;  Laterality: N/A;   knee athroscopy Left    LAPAROSCOPIC HYSTERECTOMY     LEFT HEART CATH AND CORONARY ANGIOGRAPHY N/A 11/15/2020   Procedure: LEFT HEART CATH AND CORONARY ANGIOGRAPHY;  Surgeon: Yolonda Kida, MD;  Location: [facility];  Service: Cardiovascular;  Laterality: N/A;   LOWER EXTREMITY ANGIOGRAPHY Left 09/05/2020   Procedure: LOWER EXTREMITY ANGIOGRAPHY;  Surgeon: Katha Cabal, MD;  Location: [facility];  Service: Cardiovascular;  Laterality: Left;   LOWER EXTREMITY ANGIOGRAPHY Left 04/10/2021   Procedure: LOWER EXTREMITY ANGIOGRAPHY;  Surgeon: Katha Cabal, MD;  MRN : [id number]  Melanie Browning is a 81 y.o. (1941/04/16) female who presents with chief complaint of check circulation.  History of Present Illness:   Patient presents to [facility] today for treatment of her occluded left popliteal stent.  The patient notes that there has been a significant deterioration in the lower extremity symptoms.  The patient notes interval shortening of their claudication distance and development of mild rest pain symptoms. No new ulcers or wounds have occurred since the last visit.  There have been no significant changes to the patient's overall health care.  The patient denies amaurosis fugax or recent TIA symptoms. There are no recent neurological changes noted. There is no history of DVT, PE or superficial thrombophlebitis. The patient denies recent episodes of angina or shortness of breath.   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  Previous duplex ultrasound dated 04/08/2022 demonstrated a widely patent left popliteal artery stent with laminar flow and normal velocities throughout.  Current Meds  Medication Sig   aspirin EC 81 MG tablet Take 81 mg by mouth every evening. Swallow whole.   atorvastatin (LIPITOR) 40 MG tablet Take 40 mg by mouth every evening.    budesonide (ENTOCORT EC) 3 MG 24 hr capsule Take 9 mg by mouth daily.   clopidogrel (PLAVIX) 75 MG tablet TAKE 1 TABLET BY MOUTH DAILY   conjugated estrogens (PREMARIN) vaginal cream Insert 1/4 applicator full in to the vagina at night twice weekly.   diphenoxylate-atropine (LOMOTIL) 2.5-0.025 MG tablet TAKE 1 TABLET BY MOUTH 3 TIMES DAILY AS NEEDED FOR DIARRHEA.   levothyroxine (SYNTHROID) 75 MCG tablet Take 75 mcg by mouth daily before breakfast.    losartan (COZAAR) 25 MG tablet Take  25 mg by mouth daily.    Past Medical History:  Diagnosis Date   Arthritis    Colitis    Dry eye    bilateral when living in [place]   HPV (human papilloma virus) infection    Hyperlipidemia    Ischemia of left lower extremity    Osteoporosis    Polymyalgia rheumatica (HCC)    Thyroid disease    Vaginal dysplasia     Past Surgical History:  Procedure Laterality Date   ABDOMINAL HYSTERECTOMY     complete   COLONOSCOPY WITH PROPOFOL N/A 10/04/2020   Procedure: COLONOSCOPY WITH PROPOFOL;  Surgeon: Toledo, Benay Pike, MD;  Location: [facility];  Service: Gastroenterology;  Laterality: N/A;   colposcobpy     COLPOSCOPY     CORONARY STENT INTERVENTION N/A 11/15/2020   Procedure: CORONARY STENT INTERVENTION;  Surgeon: Yolonda Kida, MD;  Location: [facility];  Service: Cardiovascular;  Laterality: N/A;   knee athroscopy Left    LAPAROSCOPIC HYSTERECTOMY     LEFT HEART CATH AND CORONARY ANGIOGRAPHY N/A 11/15/2020   Procedure: LEFT HEART CATH AND CORONARY ANGIOGRAPHY;  Surgeon: Yolonda Kida, MD;  Location: [facility];  Service: Cardiovascular;  Laterality: N/A;   LOWER EXTREMITY ANGIOGRAPHY Left 09/05/2020   Procedure: LOWER EXTREMITY ANGIOGRAPHY;  Surgeon: Katha Cabal, MD;  Location: [facility];  Service: Cardiovascular;  Laterality: Left;   LOWER EXTREMITY ANGIOGRAPHY Left 04/10/2021   Procedure: LOWER EXTREMITY ANGIOGRAPHY;  Surgeon: Katha Cabal, MD;  MRN : [id number]  Melanie Browning is a 81 y.o. (1941/04/16) female who presents with chief complaint of check circulation.  History of Present Illness:   Patient presents to [facility] today for treatment of her occluded left popliteal stent.  The patient notes that there has been a significant deterioration in the lower extremity symptoms.  The patient notes interval shortening of their claudication distance and development of mild rest pain symptoms. No new ulcers or wounds have occurred since the last visit.  There have been no significant changes to the patient's overall health care.  The patient denies amaurosis fugax or recent TIA symptoms. There are no recent neurological changes noted. There is no history of DVT, PE or superficial thrombophlebitis. The patient denies recent episodes of angina or shortness of breath.   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  Previous duplex ultrasound dated 04/08/2022 demonstrated a widely patent left popliteal artery stent with laminar flow and normal velocities throughout.  Current Meds  Medication Sig   aspirin EC 81 MG tablet Take 81 mg by mouth every evening. Swallow whole.   atorvastatin (LIPITOR) 40 MG tablet Take 40 mg by mouth every evening.    budesonide (ENTOCORT EC) 3 MG 24 hr capsule Take 9 mg by mouth daily.   clopidogrel (PLAVIX) 75 MG tablet TAKE 1 TABLET BY MOUTH DAILY   conjugated estrogens (PREMARIN) vaginal cream Insert 1/4 applicator full in to the vagina at night twice weekly.   diphenoxylate-atropine (LOMOTIL) 2.5-0.025 MG tablet TAKE 1 TABLET BY MOUTH 3 TIMES DAILY AS NEEDED FOR DIARRHEA.   levothyroxine (SYNTHROID) 75 MCG tablet Take 75 mcg by mouth daily before breakfast.    losartan (COZAAR) 25 MG tablet Take  25 mg by mouth daily.    Past Medical History:  Diagnosis Date   Arthritis    Colitis    Dry eye    bilateral when living in [place]   HPV (human papilloma virus) infection    Hyperlipidemia    Ischemia of left lower extremity    Osteoporosis    Polymyalgia rheumatica (HCC)    Thyroid disease    Vaginal dysplasia     Past Surgical History:  Procedure Laterality Date   ABDOMINAL HYSTERECTOMY     complete   COLONOSCOPY WITH PROPOFOL N/A 10/04/2020   Procedure: COLONOSCOPY WITH PROPOFOL;  Surgeon: Toledo, Benay Pike, MD;  Location: [facility];  Service: Gastroenterology;  Laterality: N/A;   colposcobpy     COLPOSCOPY     CORONARY STENT INTERVENTION N/A 11/15/2020   Procedure: CORONARY STENT INTERVENTION;  Surgeon: Yolonda Kida, MD;  Location: [facility];  Service: Cardiovascular;  Laterality: N/A;   knee athroscopy Left    LAPAROSCOPIC HYSTERECTOMY     LEFT HEART CATH AND CORONARY ANGIOGRAPHY N/A 11/15/2020   Procedure: LEFT HEART CATH AND CORONARY ANGIOGRAPHY;  Surgeon: Yolonda Kida, MD;  Location: [facility];  Service: Cardiovascular;  Laterality: N/A;   LOWER EXTREMITY ANGIOGRAPHY Left 09/05/2020   Procedure: LOWER EXTREMITY ANGIOGRAPHY;  Surgeon: Katha Cabal, MD;  Location: [facility];  Service: Cardiovascular;  Laterality: Left;   LOWER EXTREMITY ANGIOGRAPHY Left 04/10/2021   Procedure: LOWER EXTREMITY ANGIOGRAPHY;  Surgeon: Katha Cabal, MD;  WBC 6.5 04/11/2021   HGB 12.2 04/11/2021   HCT 36.6 04/11/2021   MCV 97.9 04/11/2021   PLT 167 04/11/2021    BMET    Component Value Date/Time   BUN 18 11/20/2021 1505   CREATININE 0.63 11/20/2021 1505   GFRNONAA >60 11/20/2021 1505   CrCl cannot be calculated (Patient's most recent lab result is older than the maximum 21 days allowed.).  COAG No results found for: "INR", "PROTIME"  Radiology VAS [place] ABI WITH/WO TBI  Result Date: 05/30/2022  LOWER EXTREMITY DOPPLER STUDY Patient Name:  Melanie Browning  Date of Exam:   05/28/2022 Medical Rec #: [id number]      Accession #:    [id number] Date of Birth: Aug 11, 1941       Patient Gender: F Patient Age:   66 years Exam Location:  [facility] Procedure:      VAS [place] ABI WITH/WO TBI Referring Phys: [facility] --------------------------------------------------------------------------------  Indications: Peripheral artery disease.  Vascular Interventions: 10/13/2019: Left popliteal artery PTA;                         10/14/2019: Left popliteal artery reconstruction;                          02/03/2020: Left popliteal artery angioplasty;                         04/10/2021: Left distal SFA/Popliteal Artery                         thrombectomy/PTA/stent;                          11/20/2021: PTA and Stent placement left SFA and                         Popliteal Artery. PTA Left tibioperoneal trunk and                         Posterior Tibial Artery. Comparison Study: 04/08/2022 Performing Technologist: Salvadore Farber RVT  Examination Guidelines: A complete evaluation includes at minimum, Doppler waveform signals and systolic blood pressure reading at the level of bilateral brachial, anterior tibial, and posterior tibial arteries, when vessel segments are accessible. Bilateral testing is considered an integral part of a complete examination. Photoelectric Plethysmograph (PPG) waveforms and toe systolic pressure readings are included as required and additional duplex testing as needed. Limited examinations for reoccurring indications may be performed as noted.  ABI Findings: +--------+------------------+-----+---------+--------+ Right   Rt Pressure (mmHg)IndexWaveform Comment  +--------+------------------+-----+---------+--------+ [id number]                                      +--------+------------------+-----+---------+--------+ PTA     160               1.10 triphasic         +--------+------------------+-----+---------+--------+ +--------+------------------+-----+-------------------+-------+ Left    Lt Pressure (mmHg)IndexWaveform           Comment +--------+------------------+-----+-------------------+-------+ [id number]                                               +--------+------------------+-----+-------------------+-------+

## 2022-06-05 NOTE — Progress Notes (Signed)
Documentation at time done by this Dalbert Mayotte RN

## 2022-06-05 NOTE — Op Note (Addendum)
Melanie Browning  Percutaneous Study/Intervention Procedural Note   Date of Surgery: 06/05/2022  Surgeon:  Melanie Browning  Pre-operative Diagnosis: Atherosclerotic occlusive disease bilateral lower extremities with left lower extremity lifestyle limiting claudication and mild rest pain.  Post-operative diagnosis:  Same  Procedure(s) Performed:             1.  Introduction catheter into left lower extremity 3rd order catheter placement Contrast injection left lower extremity for distal runoff               2.  Rota Rex mechanical thrombectomy of the left SFA and popliteal             3.  Percutaneous transluminal angioplasty and stent placement left superficial femoral and popliteal arteries to 5 mm maximally             4.  Percutaneous transluminal angioplasty left posterior tibial to approximately 3.5 mm             5.  Star close closure right common femoral arteriotomy  Anesthesia: Conscious sedation was administered under my direct supervision by the interventional radiology RN. IV Versed plus fentanyl were utilized. Continuous ECG, pulse oximetry and blood pressure was monitored throughout the entire procedure.  Conscious sedation was for a total of 1 hour 24 minutes.  Sheath: 6 French 80 cm penumbra sheath right common femoral retrograde  Contrast: 55 cc  Fluoroscopy Time: 9.8 minutes  Indications:  Melanie Browning presents with increasing pain of the left lower extremity.  Melanie Browning is now having lifestyle limiting claudication as well as mild rest pain.  Noninvasive studies demonstrate a profound deterioration since her previous studies.  This suggests the patient is having limb threatening ischemia. The risks and benefits are reviewed all questions answered patient agrees to proceed.  Procedure:   Melanie Browning is a 81 y.o. y.o. female who was identified and appropriate procedural time out was performed.  The patient was then placed supine on the table and prepped  and draped in the usual sterile fashion.    Ultrasound was placed in the sterile sleeve and the right groin was evaluated the right common femoral artery was echolucent and pulsatile indicating patency.  Image was recorded for the permanent record and under real-time visualization a microneedle was inserted into the common femoral artery followed by the microwire and then the micro-sheath.  A J-wire was then advanced through the micro-sheath and a  5 [place] sheath was then inserted over a J-wire. J-wire was then advanced and a 5 French pigtail catheter was positioned at the level of T12.  AP projection of the aorta was then obtained. Pigtail catheter was repositioned to above the bifurcation and a RAO view of the pelvis was obtained.  Subsequently a pigtail catheter with an Advantage wire was used to cross the aortic bifurcation.  The catheter and wire were advanced down into the left distal external iliac artery. Oblique view of the femoral bifurcation was then obtained and subsequently the wire was reintroduced and the pigtail catheter negotiated into the SFA representing third order catheter placement. Distal runoff was then performed.  Diagnostic interpretation: The abdominal aorta is opacified with a bolus injection contrast.  It is widely patent.  Bilateral common and internal and external iliac arteries are widely patent and free of significant atherosclerotic changes.  The common femoral on the left as well as the profunda femoris are widely patent.  The superficial femoral artery demonstrates mild disease proximally.  Initial  images demonstrate occlusion of the previous superficial femoral-popliteal stent.  There is nonfilling of the anterior tibial in its entirety.  The tibioperoneal trunk is occluded.  There is faint reconstitution of the posterior tibial and peroneal but there is significant greater than 90% stenoses noted in the posterior tibial proximally.  Distal to these lesions the posterior  tibial is widely patent and appears to be the dominant runoff to the foot.   5000 units of heparin was then given and allowed to circulate for several minutes.  A 80 cm penumbra sheath was advanced up and over the bifurcation and positioned in the femoral artery  KMP catheter and advantage Glidewire were then negotiated down into the distal popliteal. Catheter was then advanced and then the wire is negotiated into the posterior tibial.  Catheter is advanced to this level and hand-injection of contrast demonstrates intraluminal positioning within the posterior tibial and confirms this is the dominant runoff to the foot.  A V18 wire was then advanced through the Kumpe catheter and a [place] Rex thrombectomy catheter is then prepped on the field.  The [place] Rex catheter is used to perform thrombectomy beginning in the SFA at Hunter's canal and extending down to the distal popliteal.  Follow-up imaging by hand-injection through the sheath now demonstrates that the vast majority of the thrombus is berm removed from the previous stent and the SFA and popliteal are patent but there is diffuse greater than 60% narrowing throughout the stent.    I selected a 5 mm x 200 mm Lutonix drug-eluting balloon advanced over the wire and angioplasty was performed to 10 atm for 1 minute.  Follow-up imaging now demonstrates patency of the previous stent with filling of the peroneal and posterior tibial.  There is now recruitment of the anterior tibial as well.  However, there is greater than 80% stenosis in the proximal segment of the stent, essentially in the SFA at Hunter's canal.  A 6 mm x 60 mm life stent is then deployed across this proximal lesion and a 5 mm x 200 mm Lutonix balloon was used to angioplasty the proximal stent as well as the remaining segment of stented SFA and popliteal..  The inflation was for 1 minute at 10 atm. Follow-up imaging demonstrated patency with less than 10% residual stenosis throughout the SFA and  proximal and mid popliteal.  The detector was then positioned distally.  Kumpe catheter was advanced over the V18 wire down into the posterior tibial and a 0.014 run-through wire was then advanced without difficulty.  A 3 mm x 100 mm ultra score balloon was advanced into the posterior tibial and tibioperoneal trunk.  Inflation was to 12 atmospheres for 1 minute.  A 4 mm x 80 mm Lutonix drug-eluting balloon was then advanced into the posterior tibial tibioperoneal trunk and inflated to approximately 3 atm for 1 minute.  This would yield a luminal gain of 3.5 mm.  Follow-up imaging demonstrated patency with less than 10% residual stenosis within the posterior tibial and tibioperoneal trunk.  The distal popliteal was still greater than 50% narrowed and therefore a 4 mm x 40 mm Lutonix drug-eluting balloon was then advanced across the distal popliteal and inflated to 10 atm for 1 full minute.  Follow-up imaging demonstrated less than 10% residual stenosis throughout the SFA popliteal tibioperoneal trunk and posterior tibial.  Distal runoff was then reassessed and noted to be widely patent.    After review of these images the sheath is pulled into the  right external iliac oblique of the common femoral is obtained and a Star close device deployed. There no immediate Complications.  Findings:   The abdominal aorta is opacified with a bolus injection contrast.  It is widely patent.  Bilateral common and internal and external iliac arteries are widely patent and free of significant atherosclerotic changes.  The common femoral on the left as well as the profunda femoris are widely patent.  The superficial femoral artery demonstrates mild disease proximally.  Initial images demonstrate occlusion of the previous superficial femoral-popliteal stent.  There is nonfilling of the anterior tibial in its entirety.  The tibioperoneal trunk is occluded.  There is faint reconstitution of the posterior tibial and peroneal but  there is significant greater than 90% stenoses noted in the posterior tibial proximally.  Distal to these lesions the posterior tibial is widely patent and appears to be the dominant runoff to the foot.   Following thrombectomy with the Green[place] Rex catheter there is now reestablishment of the lumen and virtually all the thrombus is removed however high-grade stenoses are noted.  Following angioplasty to 5 mm the SFA demonstrates a greater than 60% residual stenosis and therefore a life stent is added as described above the system was then postdilated with a 5 mm with excellent result less than 10% residual stenosis throughout the SFA and popliteal.  Following angioplasty posterior tibial and TP trunk now is in-line flow and looks quite nice with less than 10% residual stenosis.   Summary: Successful recanalization left lower extremity for limb salvage                           Disposition: Patient was taken to the recovery room in stable condition having tolerated the procedure well.  Melanie Browning 06/05/2022,5:50 PM

## 2022-06-06 ENCOUNTER — Encounter (INDEPENDENT_AMBULATORY_CARE_PROVIDER_SITE_OTHER): Payer: Self-pay | Admitting: Vascular Surgery

## 2022-06-06 ENCOUNTER — Encounter: Payer: Self-pay | Admitting: Vascular Surgery

## 2022-06-06 ENCOUNTER — Telehealth (INDEPENDENT_AMBULATORY_CARE_PROVIDER_SITE_OTHER): Payer: Self-pay

## 2022-06-06 ENCOUNTER — Telehealth (INDEPENDENT_AMBULATORY_CARE_PROVIDER_SITE_OTHER): Payer: Self-pay | Admitting: Vascular Surgery

## 2022-06-06 NOTE — Telephone Encounter (Signed)
Patient called and stated she is going out of town and wanted to know if it was a good idea for her to go before she ships her luggage out.  She also states she will not be able to make her f/u appointment on the 5th of October because she will be out of state. Wants to know if it will be ok for her to travel.

## 2022-06-06 NOTE — Telephone Encounter (Signed)
Patient left a voicemail stating that she was having more bleeding at the entry site than usually. The patient had left le angio on yesterday.The patient informed that the pad was fully saturated and don't believe a bandaid will not be the best to switch too. I spoke with Dr Gilda Crease and he recommended for the patient to fold 4x4 and apply tape to place pressure. Patient verbalized understanding to medical recommendations.

## 2022-06-07 ENCOUNTER — Encounter: Payer: Self-pay | Admitting: Vascular Surgery

## 2022-06-07 ENCOUNTER — Ambulatory Visit (INDEPENDENT_AMBULATORY_CARE_PROVIDER_SITE_OTHER): Payer: Medicare Other | Admitting: Nurse Practitioner

## 2022-06-07 VITALS — BP 129/82 | HR 87 | Resp 17 | Ht 68.0 in | Wt 142.8 lb

## 2022-06-07 DIAGNOSIS — Z95820 Peripheral vascular angioplasty status with implants and grafts: Secondary | ICD-10-CM

## 2022-06-10 ENCOUNTER — Other Ambulatory Visit (INDEPENDENT_AMBULATORY_CARE_PROVIDER_SITE_OTHER): Payer: Self-pay | Admitting: Nurse Practitioner

## 2022-06-10 ENCOUNTER — Ambulatory Visit: Payer: Medicare Other | Admitting: Pain Medicine

## 2022-06-10 ENCOUNTER — Ambulatory Visit (INDEPENDENT_AMBULATORY_CARE_PROVIDER_SITE_OTHER): Payer: Medicare Other

## 2022-06-10 DIAGNOSIS — Z95820 Peripheral vascular angioplasty status with implants and grafts: Secondary | ICD-10-CM | POA: Diagnosis not present

## 2022-06-10 DIAGNOSIS — I70222 Atherosclerosis of native arteries of extremities with rest pain, left leg: Secondary | ICD-10-CM

## 2022-06-12 ENCOUNTER — Ambulatory Visit: Payer: Medicare Other

## 2022-06-12 NOTE — Progress Notes (Signed)
MRN : [id number]  Melanie Browning is a 81 y.o. (Feb 20, 1941) female who presents with chief complaint of check circulation.  History of Present Illness:   The patient returns to the office for followup and review status post angiogram with intervention on 06/05/2022.   Procedure:  Rota Rex mechanical thrombectomy of the left SFA and popliteal 2.    Percutaneous transluminal angioplasty and stent placement left superficial femoral and popliteal arteries to 5 mm maximally 3.    Percutaneous transluminal angioplasty left posterior tibial to approximately 3.5 mm  The patient notes improvement in the lower extremity symptoms. No interval shortening of the patient's claudication distance or rest pain symptoms. No new ulcers or wounds have occurred since the last visit.  There have been no significant changes to the patient's overall health care.  No documented history of amaurosis fugax or recent TIA symptoms. There are no recent neurological changes noted. No documented history of DVT, PE or superficial thrombophlebitis. The patient denies recent episodes of angina or shortness of breath.   Duplex US of the left lower extremity arterial system shows widely patent arterial system popliteal stent does not demonstrate any hemodynamic shifts or narrowings.  There appears to be three-vessel runoff to the foot  No outpatient medications have been marked as taking for the 06/13/22 encounter (Appointment) with Gilda Crease, Latina Craver, MD.    Past Medical History:  Diagnosis Date   Arthritis    Colitis    [place]   HPV (human papilloma virus) infection    Hyperlipidemia    Ischemia of left lower extremity    Osteoporosis    Polymyalgia rheumatica (HCC)    Thyroid disease    Vaginal dysplasia     Past Surgical History:  Procedure Laterality Date   ABDOMINAL HYSTERECTOMY     complete   COLONOSCOPY WITH PROPOFOL N/A 10/04/2020   Procedure:  COLONOSCOPY WITH PROPOFOL;  Surgeon: Toledo, Boykin Nearing, MD;  Location: [facility];  Service: Gastroenterology;  Laterality: N/A;   colposcobpy     COLPOSCOPY     CORONARY STENT INTERVENTION N/A 11/15/2020   Procedure: CORONARY STENT INTERVENTION;  Surgeon: Alwyn Pea, MD;  Location: [facility];  Service: Cardiovascular;  Laterality: N/A;   knee athroscopy Left    LAPAROSCOPIC HYSTERECTOMY     LEFT HEART CATH AND CORONARY ANGIOGRAPHY N/A 11/15/2020   Procedure: LEFT HEART CATH AND CORONARY ANGIOGRAPHY;  Surgeon: Alwyn Pea, MD;  Location: [facility];  Service: Cardiovascular;  Laterality: N/A;   LOWER EXTREMITY ANGIOGRAPHY Left 09/05/2020   Procedure: LOWER EXTREMITY ANGIOGRAPHY;  Surgeon: Renford Dills, MD;  Location: [facility];  Service: Cardiovascular;  Laterality: Left;   LOWER EXTREMITY ANGIOGRAPHY Left 04/10/2021   Procedure: LOWER EXTREMITY ANGIOGRAPHY;  Surgeon: Renford Dills, MD;  Location: [facility];  Service: Cardiovascular;  Laterality: Left;   LOWER EXTREMITY ANGIOGRAPHY Left 11/20/2021   Procedure: Lower Extremity Angiography;  Surgeon: Renford Dills, MD;  Location: [facility];  Service: Cardiovascular;  Laterality: Left;   LOWER EXTREMITY ANGIOGRAPHY Left 06/05/2022   Procedure: Lower Extremity Angiography;  Surgeon: Renford Dills, MD;  Location: [facility];  Service: Cardiovascular;  Laterality: Left;    Social History Social History   Tobacco Use   Smoking status: Never   Smokeless tobacco: Never  Vaping Use   Vaping Use: Never used  Substance Use Topics   Alcohol use: Yes    Alcohol/week: 3.0 standard drinks of alcohol    Types: 3 Glasses of wine per week    Comment: 3 glasses of wine per week    Drug use: Never    Family History Family History  Problem Relation Age of Onset   Cancer Mother    Pancreatic cancer Mother    Heart disease Father     Allergies   Allergen Reactions   Lactose Other (See Comments)    Gi upset and diarrhea  Other reaction(s): Other (See Comments) Gi upset and diarrhea Gi upset and diarrhea Gi upset and diarrhea     REVIEW OF SYSTEMS (Negative unless checked)  Constitutional: [] Weight loss  [] Fever  [] Chills Cardiac: [] Chest pain   [] Chest pressure   [] Palpitations   [] Shortness of breath when laying flat   [] Shortness of breath with exertion. Vascular:  [x] Pain in legs with walking   [] Pain in legs at rest  [] History of DVT   [] Phlebitis   [] Swelling in legs   [] Varicose veins   [] Non-healing ulcers Pulmonary:   [] Uses home oxygen   [] Productive cough   [] Hemoptysis   [] Wheeze  [] COPD   [] Asthma Neurologic:  [] Dizziness   [] Seizures   [] History of stroke   [] History of TIA  [] Aphasia   [] Vissual changes   [] Weakness or numbness in arm   [] Weakness or numbness in leg Musculoskeletal:   [] Joint swelling   [] Joint pain   [] Low back pain Hematologic:  [] Easy bruising  [] Easy bleeding   [] Hypercoagulable state   [] Anemic Gastrointestinal:  [] Diarrhea   [] Vomiting  [] Gastroesophageal reflux/heartburn   [] Difficulty swallowing. Genitourinary:  [] Chronic kidney disease   [] Difficult urination  [] Frequent urination   [] Blood in urine Skin:  [] Rashes   [] Ulcers  Psychological:  [] History of anxiety   []  History of major depression.  Physical Examination  There were no vitals filed for this visit. There is no height or weight on file to calculate BMI. Gen: WD/WN, NAD Head: /AT, No temporalis wasting.  Ear/Nose/Throat: Hearing grossly intact, nares w/o erythema or drainage Eyes: PER, EOMI, sclera nonicteric.  Neck: Supple, no masses.  No bruit or JVD.  Pulmonary:  Good air movement, no audible wheezing, no use of accessory muscles.  Cardiac: RRR, normal S1, S2, no Murmurs. Vascular: Moderate venous changes with diffuse small varicosities, no open wounds Vessel Right Left  Radial Palpable Palpable  PT  Palpable   Palpable  DP Palpable  Palpable  Gastrointestinal: soft, non-distended. No guarding/no peritoneal signs.  Musculoskeletal: M/S 5/5 throughout.  No visible deformity.  Neurologic: CN 2-12 intact. Pain and light touch intact in extremities.  Symmetrical.  Speech is fluent. Motor exam as listed above. Psychiatric: Judgment intact, Mood & affect appropriate for pt's clinical situation. Dermatologic: No rashes or ulcers noted.  No changes consistent with cellulitis.   CBC Lab Results  Component Value Date   WBC 6.5 04/11/2021   HGB 12.2 04/11/2021   HCT 36.6 04/11/2021   MCV 97.9 04/11/2021   PLT 167 04/11/2021    BMET    Component Value Date/Time   BUN 17 06/05/2022 1057   CREATININE 0.59 06/05/2022 1057   GFRNONAA >60 06/05/2022 1057   Estimated Creatinine Clearance: 57.5 mL/min (by C-G formula based on SCr of 0.59 mg/dL).  COAG No results found for: "INR", "PROTIME"  Radiology PERIPHERAL VASCULAR CATHETERIZATION  Result Date: 06/05/2022 See surgical  note for result.  VAS [place] ABI WITH/WO TBI  Result Date: 05/30/2022  LOWER EXTREMITY DOPPLER STUDY Patient Name:  Elzada Mccreedy  Date of Exam:   05/28/2022 Medical Rec #: [id number]      Accession #:    [id number] Date of Birth: 11/07/1940       Patient Gender: F Patient Age:   75 years Exam Location:  [facility] Procedure:      VAS [place] ABI WITH/WO TBI Referring Phys: [facility] --------------------------------------------------------------------------------  Indications: Peripheral artery disease.  Vascular Interventions: 10/13/2019: Left popliteal artery PTA;                         10/14/2019: Left popliteal artery reconstruction;                         02/03/2020: Left popliteal artery angioplasty;                         04/10/2021: Left distal SFA/Popliteal Artery                         thrombectomy/PTA/stent;                          11/20/2021: PTA and Stent placement left SFA and                         Popliteal  Artery. PTA Left tibioperoneal trunk and                         Posterior Tibial Artery. Comparison Study: 04/08/2022 Performing Technologist: Salvadore Farber RVT  Examination Guidelines: A complete evaluation includes at minimum, Doppler waveform signals and systolic blood pressure reading at the level of bilateral brachial, anterior tibial, and posterior tibial arteries, when vessel segments are accessible. Bilateral testing is considered an integral part of a complete examination. Photoelectric Plethysmograph (PPG) waveforms and toe systolic pressure readings are included as required and additional duplex testing as needed. Limited examinations for reoccurring indications may be performed as noted.  ABI Findings: +--------+------------------+-----+---------+--------+ Right   Rt Pressure (mmHg)IndexWaveform Comment  +--------+------------------+-----+---------+--------+ [id number]                                      +--------+------------------+-----+---------+--------+ PTA     160               1.10 triphasic         +--------+------------------+-----+---------+--------+ +--------+------------------+-----+-------------------+-------+ Left    Lt Pressure (mmHg)IndexWaveform           Comment +--------+------------------+-----+-------------------+-------+ [id number]                                               +--------+------------------+-----+-------------------+-------+ PTA     78                0.54 dampened monophasic        +--------+------------------+-----+-------------------+-------+ +-------+-----------+-----------+------------+------------+ ABI/TBIToday's ABIToday's TBIPrevious ABIPrevious TBI +-------+-----------+-----------+------------+------------+ Right  1.10                  1.20                     +-------+-----------+-----------+------------+------------+  Left   .54                   1.15                      +-------+-----------+-----------+------------+------------+  Left ABIs appear decreased compared to prior study on 04/08/2022.  Summary: Left: Resting left ankle-brachial index indicates moderate left lower extremity arterial disease. Left duplex shows SFA/Pop stents are occluded. *See table(s) above for measurements and observations.  Electronically signed by Festus Barren MD on 05/30/2022 at 10:56:42 AM.    Final      Assessment/Plan 1. 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  The patient reports that the claudication symptoms and leg pain has improved.   The patient denies lifestyle limiting changes at this point in time.  No further invasive studies, angiography or surgery at this time.  However, she has now had multiple interventions essentially in the popliteal and trifurcation and although an excellent result was achieved each time with normalization of her ABIs and elimination of her symptoms the result is not durable.  Because of this we had a very lengthy conversation about intervention but also alternative possibilities including a formal bypass procedure.  To this end I will order vein mapping so that we have the knowledge regarding appropriate conduit should it come to this.  We discussed her future and addressed her concerns regarding possible amputation.  For the record, I believe her chance of major amputation is less than 10%.  Even when she has thrombosed her reconstruction she has only been suffering short distance claudication and not profound ischemic changes to the foot.  Complicating all of this is the fact that she is a very active person who enjoys traveling extensively and at least from the vascular point of view to some of her travels are to remote locations which may not have surgical services accessible.  The patient should continue walking and begin  a more formal exercise program.  The patient should continue antiplatelet therapy and aggressive treatment of the lipid abnormalities  Continued surveillance is indicated as atherosclerosis is likely to progress with time.  She is scheduled to go on a cruise of the [facility] at the end of this month and therefore I will step up the surveillance.  She is also planning to take a trip to [place] in November and we have discussed the possibility of CTA prior to that trip and its limitations given her stents as well as the possibility of just moving forward with conventional angiography to ensure that she is as optimized vascularly for this adventure.  Patient should undergo noninvasive studies as ordered. The patient will follow up with me to review the studies.     A total of 70 minutes was spent with this patient and greater than 50% was spent in counseling and coordination of care with the patient.  Discussion included the treatment options for vascular disease including indications for surgery and intervention.  Also discussed is the appropriate timing of treatment.  In addition medical therapy was discussed.   - VAS [place] ABI WITH/WO TBI; Future - VAS [place] LOWER EXTREMITY VENOUS (DVT); Future  2. Popliteal artery injury, left, sequela See #1  3. Coronary artery disease of native artery of native heart with stable angina pectoris (HCC) Continue cardiac and antihypertensive medications as already ordered and reviewed, no changes at  this time.  Continue statin as ordered and reviewed, no changes at this time  Nitrates PRN for chest pain   4. Mixed hyperlipidemia Continue statin as ordered and reviewed, no changes at this time     Levora Dredge, MD  06/12/2022 8:14 PM

## 2022-06-13 ENCOUNTER — Encounter (INDEPENDENT_AMBULATORY_CARE_PROVIDER_SITE_OTHER): Payer: Self-pay | Admitting: Vascular Surgery

## 2022-06-13 ENCOUNTER — Ambulatory Visit (INDEPENDENT_AMBULATORY_CARE_PROVIDER_SITE_OTHER): Payer: Medicare Other | Admitting: Vascular Surgery

## 2022-06-13 VITALS — BP 157/80 | HR 75 | Resp 16 | Wt 144.8 lb

## 2022-06-13 DIAGNOSIS — E782 Mixed hyperlipidemia: Secondary | ICD-10-CM

## 2022-06-13 DIAGNOSIS — I70212 Atherosclerosis of native arteries of extremities with intermittent claudication, left leg: Secondary | ICD-10-CM

## 2022-06-13 DIAGNOSIS — I70223 Atherosclerosis of native arteries of extremities with rest pain, bilateral legs: Secondary | ICD-10-CM | POA: Diagnosis not present

## 2022-06-13 DIAGNOSIS — I25118 Atherosclerotic heart disease of native coronary artery with other forms of angina pectoris: Secondary | ICD-10-CM

## 2022-06-13 DIAGNOSIS — S85002S Unspecified injury of popliteal artery, left leg, sequela: Secondary | ICD-10-CM

## 2022-06-14 ENCOUNTER — Encounter (INDEPENDENT_AMBULATORY_CARE_PROVIDER_SITE_OTHER): Payer: Self-pay | Admitting: Vascular Surgery

## 2022-06-18 ENCOUNTER — Other Ambulatory Visit (INDEPENDENT_AMBULATORY_CARE_PROVIDER_SITE_OTHER): Payer: Self-pay | Admitting: Vascular Surgery

## 2022-06-18 DIAGNOSIS — Z01810 Encounter for preprocedural cardiovascular examination: Secondary | ICD-10-CM

## 2022-06-18 DIAGNOSIS — I70212 Atherosclerosis of native arteries of extremities with intermittent claudication, left leg: Secondary | ICD-10-CM

## 2022-06-19 NOTE — Progress Notes (Signed)
MRN : [id number]  Melanie Browning is a 81 y.o. (Aug 13, 1941) female who presents with chief complaint of check circulation.  History of Present Illness:   The patient returns to the office for followup and review status post angiogram with intervention on 06/05/2022.    Procedure:  Rota Rex mechanical thrombectomy of the left SFA and popliteal 2.    Percutaneous transluminal angioplasty and stent placement left superficial femoral and popliteal arteries to 5 mm maximally 3.    Percutaneous transluminal angioplasty left posterior tibial to approximately 3.5 mm   The patient notes improvement in the lower extremity symptoms. No interval shortening of the patient's claudication distance or rest pain symptoms. No new ulcers or wounds have occurred since the last visit.   There have been no significant changes to the patient's overall health care.   No documented history of amaurosis fugax or recent TIA symptoms. There are no recent neurological changes noted. No documented history of DVT, PE or superficial thrombophlebitis. The patient denies recent episodes of angina or shortness of breath.    Previous duplex US of the left lower extremity arterial system shows widely patent arterial system popliteal stent does not demonstrate any hemodynamic shifts or narrowings.  There appears to be three-vessel runoff to the foot.  ABI's Rt=1.10 and Lt=1.06 (previous Rt=1.10 and Lt=0.54)  Vein mapping of the left GSV shows a patent but not ideal vein for bypass.  No outpatient medications have been marked as taking for the 06/20/22 encounter (Appointment) with Delana Meyer, Dolores Lory, MD.    Past Medical History:  Diagnosis Date   Arthritis    Colitis    Dry eye    bilateral when living in [place]   HPV (human papilloma virus) infection    Hyperlipidemia    Ischemia of left lower extremity    Osteoporosis    Polymyalgia rheumatica ([place])    Thyroid disease    Vaginal dysplasia     Past  Surgical History:  Procedure Laterality Date   ABDOMINAL HYSTERECTOMY     complete   COLONOSCOPY WITH PROPOFOL N/A 10/04/2020   Procedure: COLONOSCOPY WITH PROPOFOL;  Surgeon: Toledo, Benay Pike, MD;  Location: [facility];  Service: Gastroenterology;  Laterality: N/A;   colposcobpy     COLPOSCOPY     CORONARY STENT INTERVENTION N/A 11/15/2020   Procedure: CORONARY STENT INTERVENTION;  Surgeon: Yolonda Kida, MD;  Location: [facility];  Service: Cardiovascular;  Laterality: N/A;   knee athroscopy Left    LAPAROSCOPIC HYSTERECTOMY     LEFT HEART CATH AND CORONARY ANGIOGRAPHY N/A 11/15/2020   Procedure: LEFT HEART CATH AND CORONARY ANGIOGRAPHY;  Surgeon: Yolonda Kida, MD;  Location: [facility];  Service: Cardiovascular;  Laterality: N/A;   LOWER EXTREMITY ANGIOGRAPHY Left 09/05/2020   Procedure: LOWER EXTREMITY ANGIOGRAPHY;  Surgeon: Katha Cabal, MD;  Location: [facility];  Service: Cardiovascular;  Laterality: Left;   LOWER EXTREMITY ANGIOGRAPHY Left 04/10/2021   Procedure: LOWER EXTREMITY ANGIOGRAPHY;  Surgeon: Katha Cabal, MD;  Location: [facility];  Service: Cardiovascular;  Laterality: Left;   LOWER EXTREMITY ANGIOGRAPHY Left 11/20/2021   Procedure: Lower Extremity Angiography;  Surgeon: Katha Cabal, MD;  Location: [facility];  Service: Cardiovascular;  Laterality: Left;   LOWER EXTREMITY ANGIOGRAPHY Left 06/05/2022   Procedure: Lower Extremity Angiography;  Surgeon: Katha Cabal, MD;  Location: [facility];  Service: Cardiovascular;  Laterality: Left;  for: "INR", "PROTIME"  Radiology PERIPHERAL VASCULAR CATHETERIZATION  Result Date: 06/05/2022 See surgical note for result.  VAS [place] ABI WITH/WO TBI  Result Date: 05/30/2022  LOWER EXTREMITY DOPPLER STUDY Patient Name:  Melanie Browning  Date of Exam:   05/28/2022 Medical Rec #: [id number]      Accession #:    [id number] Date of Birth: 03-25-1941       Patient Gender: F Patient Age:   12 years Exam Location:  [facility] Procedure:      VAS [place] ABI WITH/WO TBI Referring Phys: [facility] --------------------------------------------------------------------------------  Indications: Peripheral artery disease.  Vascular Interventions: 10/13/2019: Left popliteal artery PTA;                         10/14/2019: Left popliteal artery reconstruction;                         02/03/2020: Left popliteal artery angioplasty;                         04/10/2021: Left distal SFA/Popliteal Artery                          thrombectomy/PTA/stent;                          11/20/2021: PTA and Stent placement left SFA and                         Popliteal Artery. PTA Left tibioperoneal trunk and                         Posterior Tibial Artery. Comparison Study: 04/08/2022 Performing Technologist: Salvadore Farber RVT  Examination Guidelines: A complete evaluation includes at minimum, Doppler waveform signals and systolic blood pressure reading at the level of bilateral brachial, anterior tibial, and posterior tibial arteries, when vessel segments are accessible. Bilateral testing is considered an integral part of a complete examination. Photoelectric Plethysmograph (PPG) waveforms and toe systolic pressure readings are included as required and additional duplex testing as needed. Limited examinations for reoccurring indications may be performed as noted.  ABI Findings: +--------+------------------+-----+---------+--------+ Right   Rt Pressure (mmHg)IndexWaveform Comment  +--------+------------------+-----+---------+--------+ [id number]                                      +--------+------------------+-----+---------+--------+ PTA     160               1.10 triphasic         +--------+------------------+-----+---------+--------+ +--------+------------------+-----+-------------------+-------+ Left    Lt Pressure (mmHg)IndexWaveform           Comment +--------+------------------+-----+-------------------+-------+ [id number]                                               +--------+------------------+-----+-------------------+-------+ PTA     78                0.54 dampened monophasic        +--------+------------------+-----+-------------------+-------+ +-------+-----------+-----------+------------+------------+ ABI/TBIToday's ABIToday's TBIPrevious ABIPrevious TBI +-------+-----------+-----------+------------+------------+ Right  1.10  for: "INR", "PROTIME"  Radiology PERIPHERAL VASCULAR CATHETERIZATION  Result Date: 06/05/2022 See surgical note for result.  VAS [place] ABI WITH/WO TBI  Result Date: 05/30/2022  LOWER EXTREMITY DOPPLER STUDY Patient Name:  Melanie Browning  Date of Exam:   05/28/2022 Medical Rec #: [id number]      Accession #:    [id number] Date of Birth: 03-25-1941       Patient Gender: F Patient Age:   12 years Exam Location:  [facility] Procedure:      VAS [place] ABI WITH/WO TBI Referring Phys: [facility] --------------------------------------------------------------------------------  Indications: Peripheral artery disease.  Vascular Interventions: 10/13/2019: Left popliteal artery PTA;                         10/14/2019: Left popliteal artery reconstruction;                         02/03/2020: Left popliteal artery angioplasty;                         04/10/2021: Left distal SFA/Popliteal Artery                          thrombectomy/PTA/stent;                          11/20/2021: PTA and Stent placement left SFA and                         Popliteal Artery. PTA Left tibioperoneal trunk and                         Posterior Tibial Artery. Comparison Study: 04/08/2022 Performing Technologist: Salvadore Farber RVT  Examination Guidelines: A complete evaluation includes at minimum, Doppler waveform signals and systolic blood pressure reading at the level of bilateral brachial, anterior tibial, and posterior tibial arteries, when vessel segments are accessible. Bilateral testing is considered an integral part of a complete examination. Photoelectric Plethysmograph (PPG) waveforms and toe systolic pressure readings are included as required and additional duplex testing as needed. Limited examinations for reoccurring indications may be performed as noted.  ABI Findings: +--------+------------------+-----+---------+--------+ Right   Rt Pressure (mmHg)IndexWaveform Comment  +--------+------------------+-----+---------+--------+ [id number]                                      +--------+------------------+-----+---------+--------+ PTA     160               1.10 triphasic         +--------+------------------+-----+---------+--------+ +--------+------------------+-----+-------------------+-------+ Left    Lt Pressure (mmHg)IndexWaveform           Comment +--------+------------------+-----+-------------------+-------+ [id number]                                               +--------+------------------+-----+-------------------+-------+ PTA     78                0.54 dampened monophasic        +--------+------------------+-----+-------------------+-------+ +-------+-----------+-----------+------------+------------+ ABI/TBIToday's ABIToday's TBIPrevious ABIPrevious TBI +-------+-----------+-----------+------------+------------+ Right  1.10  for: "INR", "PROTIME"  Radiology PERIPHERAL VASCULAR CATHETERIZATION  Result Date: 06/05/2022 See surgical note for result.  VAS [place] ABI WITH/WO TBI  Result Date: 05/30/2022  LOWER EXTREMITY DOPPLER STUDY Patient Name:  Melanie Browning  Date of Exam:   05/28/2022 Medical Rec #: [id number]      Accession #:    [id number] Date of Birth: 03-25-1941       Patient Gender: F Patient Age:   12 years Exam Location:  [facility] Procedure:      VAS [place] ABI WITH/WO TBI Referring Phys: [facility] --------------------------------------------------------------------------------  Indications: Peripheral artery disease.  Vascular Interventions: 10/13/2019: Left popliteal artery PTA;                         10/14/2019: Left popliteal artery reconstruction;                         02/03/2020: Left popliteal artery angioplasty;                         04/10/2021: Left distal SFA/Popliteal Artery                          thrombectomy/PTA/stent;                          11/20/2021: PTA and Stent placement left SFA and                         Popliteal Artery. PTA Left tibioperoneal trunk and                         Posterior Tibial Artery. Comparison Study: 04/08/2022 Performing Technologist: Salvadore Farber RVT  Examination Guidelines: A complete evaluation includes at minimum, Doppler waveform signals and systolic blood pressure reading at the level of bilateral brachial, anterior tibial, and posterior tibial arteries, when vessel segments are accessible. Bilateral testing is considered an integral part of a complete examination. Photoelectric Plethysmograph (PPG) waveforms and toe systolic pressure readings are included as required and additional duplex testing as needed. Limited examinations for reoccurring indications may be performed as noted.  ABI Findings: +--------+------------------+-----+---------+--------+ Right   Rt Pressure (mmHg)IndexWaveform Comment  +--------+------------------+-----+---------+--------+ [id number]                                      +--------+------------------+-----+---------+--------+ PTA     160               1.10 triphasic         +--------+------------------+-----+---------+--------+ +--------+------------------+-----+-------------------+-------+ Left    Lt Pressure (mmHg)IndexWaveform           Comment +--------+------------------+-----+-------------------+-------+ [id number]                                               +--------+------------------+-----+-------------------+-------+ PTA     78                0.54 dampened monophasic        +--------+------------------+-----+-------------------+-------+ +-------+-----------+-----------+------------+------------+ ABI/TBIToday's ABIToday's TBIPrevious ABIPrevious TBI +-------+-----------+-----------+------------+------------+ Right  1.10  MRN : [id number]  Melanie Browning is a 81 y.o. (Aug 13, 1941) female who presents with chief complaint of check circulation.  History of Present Illness:   The patient returns to the office for followup and review status post angiogram with intervention on 06/05/2022.    Procedure:  Rota Rex mechanical thrombectomy of the left SFA and popliteal 2.    Percutaneous transluminal angioplasty and stent placement left superficial femoral and popliteal arteries to 5 mm maximally 3.    Percutaneous transluminal angioplasty left posterior tibial to approximately 3.5 mm   The patient notes improvement in the lower extremity symptoms. No interval shortening of the patient's claudication distance or rest pain symptoms. No new ulcers or wounds have occurred since the last visit.   There have been no significant changes to the patient's overall health care.   No documented history of amaurosis fugax or recent TIA symptoms. There are no recent neurological changes noted. No documented history of DVT, PE or superficial thrombophlebitis. The patient denies recent episodes of angina or shortness of breath.    Previous duplex US of the left lower extremity arterial system shows widely patent arterial system popliteal stent does not demonstrate any hemodynamic shifts or narrowings.  There appears to be three-vessel runoff to the foot.  ABI's Rt=1.10 and Lt=1.06 (previous Rt=1.10 and Lt=0.54)  Vein mapping of the left GSV shows a patent but not ideal vein for bypass.  No outpatient medications have been marked as taking for the 06/20/22 encounter (Appointment) with Delana Meyer, Dolores Lory, MD.    Past Medical History:  Diagnosis Date   Arthritis    Colitis    Dry eye    bilateral when living in [place]   HPV (human papilloma virus) infection    Hyperlipidemia    Ischemia of left lower extremity    Osteoporosis    Polymyalgia rheumatica ([place])    Thyroid disease    Vaginal dysplasia     Past  Surgical History:  Procedure Laterality Date   ABDOMINAL HYSTERECTOMY     complete   COLONOSCOPY WITH PROPOFOL N/A 10/04/2020   Procedure: COLONOSCOPY WITH PROPOFOL;  Surgeon: Toledo, Benay Pike, MD;  Location: [facility];  Service: Gastroenterology;  Laterality: N/A;   colposcobpy     COLPOSCOPY     CORONARY STENT INTERVENTION N/A 11/15/2020   Procedure: CORONARY STENT INTERVENTION;  Surgeon: Yolonda Kida, MD;  Location: [facility];  Service: Cardiovascular;  Laterality: N/A;   knee athroscopy Left    LAPAROSCOPIC HYSTERECTOMY     LEFT HEART CATH AND CORONARY ANGIOGRAPHY N/A 11/15/2020   Procedure: LEFT HEART CATH AND CORONARY ANGIOGRAPHY;  Surgeon: Yolonda Kida, MD;  Location: [facility];  Service: Cardiovascular;  Laterality: N/A;   LOWER EXTREMITY ANGIOGRAPHY Left 09/05/2020   Procedure: LOWER EXTREMITY ANGIOGRAPHY;  Surgeon: Katha Cabal, MD;  Location: [facility];  Service: Cardiovascular;  Laterality: Left;   LOWER EXTREMITY ANGIOGRAPHY Left 04/10/2021   Procedure: LOWER EXTREMITY ANGIOGRAPHY;  Surgeon: Katha Cabal, MD;  Location: [facility];  Service: Cardiovascular;  Laterality: Left;   LOWER EXTREMITY ANGIOGRAPHY Left 11/20/2021   Procedure: Lower Extremity Angiography;  Surgeon: Katha Cabal, MD;  Location: [facility];  Service: Cardiovascular;  Laterality: Left;   LOWER EXTREMITY ANGIOGRAPHY Left 06/05/2022   Procedure: Lower Extremity Angiography;  Surgeon: Katha Cabal, MD;  Location: [facility];  Service: Cardiovascular;  Laterality: Left;

## 2022-06-20 ENCOUNTER — Ambulatory Visit (INDEPENDENT_AMBULATORY_CARE_PROVIDER_SITE_OTHER): Payer: Medicare Other | Admitting: Vascular Surgery

## 2022-06-20 ENCOUNTER — Ambulatory Visit (INDEPENDENT_AMBULATORY_CARE_PROVIDER_SITE_OTHER): Payer: Medicare Other

## 2022-06-20 ENCOUNTER — Encounter (INDEPENDENT_AMBULATORY_CARE_PROVIDER_SITE_OTHER): Payer: Self-pay | Admitting: Vascular Surgery

## 2022-06-20 VITALS — BP 173/80 | HR 73 | Resp 16

## 2022-06-20 DIAGNOSIS — I2510 Atherosclerotic heart disease of native coronary artery without angina pectoris: Secondary | ICD-10-CM

## 2022-06-20 DIAGNOSIS — I872 Venous insufficiency (chronic) (peripheral): Secondary | ICD-10-CM

## 2022-06-20 DIAGNOSIS — E039 Hypothyroidism, unspecified: Secondary | ICD-10-CM

## 2022-06-20 DIAGNOSIS — I70212 Atherosclerosis of native arteries of extremities with intermittent claudication, left leg: Secondary | ICD-10-CM

## 2022-06-20 DIAGNOSIS — I70223 Atherosclerosis of native arteries of extremities with rest pain, bilateral legs: Secondary | ICD-10-CM | POA: Diagnosis not present

## 2022-06-20 DIAGNOSIS — E782 Mixed hyperlipidemia: Secondary | ICD-10-CM | POA: Diagnosis not present

## 2022-06-20 DIAGNOSIS — Z01810 Encounter for preprocedural cardiovascular examination: Secondary | ICD-10-CM

## 2022-06-23 ENCOUNTER — Encounter (INDEPENDENT_AMBULATORY_CARE_PROVIDER_SITE_OTHER): Payer: Self-pay | Admitting: Vascular Surgery

## 2022-06-25 ENCOUNTER — Encounter (INDEPENDENT_AMBULATORY_CARE_PROVIDER_SITE_OTHER): Payer: Self-pay | Admitting: Nurse Practitioner

## 2022-06-25 NOTE — Progress Notes (Signed)
Patient presented today for evaluation after angiogram 1 day ago with wound that is persistently bleeding.  The area was treated with a lidocaine and epinephrine injection.  2 sutures were placed by Dr. Lucky Cowboy.  Hemostasis is achieved.  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  Area dressed with Xeroform and gauze.  Patient will return next week for follow-up ABIs discussion of further treatment plan with Dr. Delana Meyer.

## 2022-07-03 ENCOUNTER — Ambulatory Visit: Payer: Medicare Other | Admitting: Pain Medicine

## 2022-07-04 ENCOUNTER — Encounter (INDEPENDENT_AMBULATORY_CARE_PROVIDER_SITE_OTHER): Payer: Medicare Other

## 2022-07-04 ENCOUNTER — Ambulatory Visit (INDEPENDENT_AMBULATORY_CARE_PROVIDER_SITE_OTHER): Payer: Medicare Other | Admitting: Vascular Surgery

## 2022-07-04 ENCOUNTER — Telehealth (INDEPENDENT_AMBULATORY_CARE_PROVIDER_SITE_OTHER): Payer: Self-pay

## 2022-07-04 NOTE — Telephone Encounter (Signed)
I attempted to contact the patient to schedule a LLE angio with Dr. Delana Meyer. A message was left for a return call. Patient requested 07/23/22 and has been scheduled for that day with a 6:45 am arrival time to the [facility]. Pre-procedure instructions will be discussed and mailed.

## 2022-07-08 NOTE — Telephone Encounter (Signed)
I spoke with the patient and discussed the LLE angio with Dr. Delana Meyer. See previous message.

## 2022-07-16 ENCOUNTER — Telehealth (INDEPENDENT_AMBULATORY_CARE_PROVIDER_SITE_OTHER): Payer: Self-pay

## 2022-07-16 NOTE — Telephone Encounter (Signed)
Pt calls stating that she is scheduled for an angio on the 24th and tomorrow she has a teeth cleaning, should pt take prophylactic antibiotics this close to the angio or should she cancel the teeth cleaning until the angio is done and healed? Please advise

## 2022-07-16 NOTE — Telephone Encounter (Signed)
Spoke with pt and let her know per Arna Medici that she can take the prophylactic antibiotics and still have the angio on the 24th with no issues.  She states understanding.

## 2022-07-23 ENCOUNTER — Encounter
Admission: RE | Disposition: A | Discharge status: Home / Self Care | Payer: Self-pay | Source: Home / Self Care | Attending: Vascular Surgery

## 2022-07-23 ENCOUNTER — Encounter: Payer: Self-pay | Admitting: Vascular Surgery

## 2022-07-23 ENCOUNTER — Other Ambulatory Visit: Payer: Self-pay

## 2022-07-23 ENCOUNTER — Ambulatory Visit
Admission: RE | Admit: 2022-07-23 | Discharge: 2022-07-23 | Disposition: A | Discharge status: Home / Self Care | Payer: Medicare Other | Attending: Vascular Surgery | Admitting: Vascular Surgery

## 2022-07-23 DIAGNOSIS — Z95828 Presence of other vascular implants and grafts: Secondary | ICD-10-CM | POA: Diagnosis not present

## 2022-07-23 DIAGNOSIS — I872 Venous insufficiency (chronic) (peripheral): Secondary | ICD-10-CM | POA: Insufficient documentation

## 2022-07-23 DIAGNOSIS — E782 Mixed hyperlipidemia: Secondary | ICD-10-CM | POA: Insufficient documentation

## 2022-07-23 DIAGNOSIS — I70212 Atherosclerosis of native arteries of extremities with intermittent claudication, left leg: Secondary | ICD-10-CM | POA: Insufficient documentation

## 2022-07-23 DIAGNOSIS — I2510 Atherosclerotic heart disease of native coronary artery without angina pectoris: Secondary | ICD-10-CM | POA: Diagnosis not present

## 2022-07-23 DIAGNOSIS — I700 Atherosclerosis of aorta: Secondary | ICD-10-CM

## 2022-07-23 DIAGNOSIS — E039 Hypothyroidism, unspecified: Secondary | ICD-10-CM | POA: Insufficient documentation

## 2022-07-23 DIAGNOSIS — Z79890 Hormone replacement therapy (postmenopausal): Secondary | ICD-10-CM | POA: Diagnosis not present

## 2022-07-23 DIAGNOSIS — I70219 Atherosclerosis of native arteries of extremities with intermittent claudication, unspecified extremity: Secondary | ICD-10-CM

## 2022-07-23 HISTORY — PX: LOWER EXTREMITY ANGIOGRAPHY: CATH118251

## 2022-07-23 LAB — BUN: BUN: 19 mg/dL (ref 8–23)

## 2022-07-23 LAB — CREATININE, SERUM
Creatinine, Ser: 0.69 mg/dL (ref 0.44–1.00)
GFR, Estimated: >60 mL/min (ref >60)

## 2022-07-23 SURGERY — LOWER EXTREMITY ANGIOGRAPHY
Anesthesia: Moderate Sedation | Site: Leg Lower | Laterality: Left

## 2022-07-23 MED ORDER — MORPHINE SULFATE (PF) 4 MG/ML IV SOLN: 2.0000 mg | INTRAVENOUS | Status: DC | PRN

## 2022-07-23 MED ORDER — MIDAZOLAM HCL 2 MG/2ML IJ SOLN
INTRAMUSCULAR | Status: AC
  Filled 2022-07-23: qty 4

## 2022-07-23 MED ORDER — HEPARIN SODIUM (PORCINE) 1000 UNIT/ML IJ SOLN
INTRAMUSCULAR | Status: AC
  Filled 2022-07-23: qty 10

## 2022-07-23 MED ORDER — METHYLPREDNISOLONE SODIUM SUCC 125 MG IJ SOLR: 125.0000 mg | Freq: Once | INTRAMUSCULAR | Status: DC | PRN

## 2022-07-23 MED ORDER — FENTANYL CITRATE (PF) 100 MCG/2ML IJ SOLN
INTRAMUSCULAR | Status: AC
  Filled 2022-07-23: qty 2

## 2022-07-23 MED ORDER — ONDANSETRON HCL 4 MG/2ML IJ SOLN: 4.0000 mg | Freq: Four times a day (QID) | INTRAMUSCULAR | Status: DC | PRN

## 2022-07-23 MED ORDER — DIPHENHYDRAMINE HCL 50 MG/ML IJ SOLN: 50.0000 mg | Freq: Once | INTRAMUSCULAR | Status: DC | PRN

## 2022-07-23 MED ORDER — SODIUM CHLORIDE 0.9% FLUSH: 3.0000 mL | Freq: Two times a day (BID) | INTRAVENOUS | Status: DC

## 2022-07-23 MED ORDER — MIDAZOLAM HCL 2 MG/2ML IJ SOLN
INTRAMUSCULAR | Status: DC | PRN
  Administered 2022-07-23: 1 mg via INTRAVENOUS
  Administered 2022-07-23: 2 mg via INTRAVENOUS

## 2022-07-23 MED ORDER — LABETALOL HCL 5 MG/ML IV SOLN: 10.0000 mg | INTRAVENOUS | Status: DC | PRN

## 2022-07-23 MED ORDER — HYDRALAZINE HCL 20 MG/ML IJ SOLN: 5.0000 mg | INTRAMUSCULAR | Status: DC | PRN

## 2022-07-23 MED ORDER — SODIUM CHLORIDE 0.9 % IV SOLN: INTRAVENOUS | Status: DC

## 2022-07-23 MED ORDER — CEFAZOLIN SODIUM-DEXTROSE 2-4 GM/100ML-% IV SOLN: 2.0000 g | INTRAVENOUS | Status: AC

## 2022-07-23 MED ORDER — FENTANYL CITRATE (PF) 100 MCG/2ML IJ SOLN
INTRAMUSCULAR | Status: DC | PRN
  Administered 2022-07-23 (×2): 50 ug via INTRAVENOUS

## 2022-07-23 MED ORDER — CEFAZOLIN SODIUM-DEXTROSE 2-4 GM/100ML-% IV SOLN
INTRAVENOUS | Status: AC
  Administered 2022-07-23: 2 g via INTRAVENOUS
  Filled 2022-07-23: qty 100

## 2022-07-23 MED ORDER — MIDAZOLAM HCL 2 MG/ML PO SYRP: 8.0000 mg | ORAL_SOLUTION | Freq: Once | ORAL | Status: DC | PRN

## 2022-07-23 MED ORDER — IODIXANOL 320 MG/ML IV SOLN
INTRAVENOUS | Status: DC | PRN
  Administered 2022-07-23: 30 mL

## 2022-07-23 MED ORDER — OXYCODONE HCL 5 MG PO TABS: 5.0000 mg | ORAL_TABLET | ORAL | Status: DC | PRN

## 2022-07-23 MED ORDER — SODIUM CHLORIDE 0.9% FLUSH: 3.0000 mL | INTRAVENOUS | Status: DC | PRN

## 2022-07-23 MED ORDER — SODIUM CHLORIDE 0.9 % IV SOLN: 250.0000 mL | INTRAVENOUS | Status: DC | PRN

## 2022-07-23 MED ORDER — ACETAMINOPHEN 325 MG PO TABS: 650.0000 mg | ORAL_TABLET | ORAL | Status: DC | PRN

## 2022-07-23 MED ORDER — FAMOTIDINE 20 MG PO TABS: 40.0000 mg | ORAL_TABLET | Freq: Once | ORAL | Status: DC | PRN

## 2022-07-23 MED ORDER — HYDROMORPHONE HCL 1 MG/ML IJ SOLN: 1.0000 mg | Freq: Once | INTRAMUSCULAR | Status: DC | PRN

## 2022-07-23 SURGICAL SUPPLY — 15 items
CATH ANGIO 5F PIGTAIL 65CM (CATHETERS) IMPLANT
COVER DRAPE FLUORO 36X44 (DRAPES) IMPLANT
COVER EZ STRL 42X30 (DRAPES) IMPLANT
COVER PROBE ULTRASOUND 5X96 (MISCELLANEOUS) IMPLANT
DEVICE STARCLOSE SE CLOSURE (Vascular Products) IMPLANT
GLIDEWIRE ADV .035X260CM (WIRE) IMPLANT
NDL ENTRY 21GA 7CM ECHOTIP (NEEDLE) IMPLANT
NEEDLE ENTRY 21GA 7CM ECHOTIP (NEEDLE) ×1 IMPLANT
PACK ANGIOGRAPHY (CUSTOM PROCEDURE TRAY) ×1 IMPLANT
SET INTRO CAPELLA COAXIAL (SET/KITS/TRAYS/PACK) IMPLANT
SHEATH BRITE TIP 5FRX11 (SHEATH) IMPLANT
SUT MNCRL AB 4-0 PS2 18 (SUTURE) IMPLANT
SYR MEDRAD MARK 7 150ML (SYRINGE) IMPLANT
TUBING CONTRAST HIGH PRESS 72 (TUBING) IMPLANT
WIRE GUIDERIGHT .035X150 (WIRE) IMPLANT

## 2022-07-23 NOTE — H&P (View-Only) (Signed)
MRN : [id number]   Melanie Browning is a 81 y.o. (1940/11/17) female who presents with chief complaint of check circulation.   History of Present Illness:    The patient presents to [facility] for angiography of the left lower extremity.   The patient was last seen the office on 06/20/2022, for followup and review status post angiogram with intervention on 06/05/2022.    Procedure:  R[place] mechanical thrombectomy of the left SFA and popliteal 2.    Percutaneous transluminal angioplasty and stent placement left superficial femoral and popliteal arteries to 5 mm maximally 3.    Percutaneous transluminal angioplasty left posterior tibial to approximately 3.5 mm   The patient notes the initial improvement in the lower extremity symptoms has decreased. Interval shortening of the patient's claudication distance but no rest pain symptoms. No new ulcers or wounds have occurred since the last visit.  Her previous reconstructions have been short lived and she is going out of the country to [place] and will not have medical care available.   There have been no significant changes to the patient's overall health care.   No documented history of amaurosis fugax or recent TIA symptoms. There are no recent neurological changes noted. No documented history of DVT, PE or superficial thrombophlebitis. The patient denies recent episodes of angina or shortness of breath.    Previous duplex US of the left lower extremity arterial system shows widely patent arterial system popliteal stent does not demonstrate any hemodynamic shifts or narrowings.  There appears to be three-vessel runoff to the foot.   ABI's from last visit Rt=1.10 and Lt=1.06 (previous Rt=1.10 and Lt=0.54)   Vein mapping of the left GSV shows a patent but not ideal vein for bypass.   Active Medications      Current Meds  Medication Sig   aspirin EC 81 MG tablet Take 81 mg by mouth every evening. Swallow whole.   citalopram (CELEXA) 10 MG tablet Take  10 mg by mouth daily.   levothyroxine (SYNTHROID) 75 MCG tablet Take 75 mcg by mouth daily before breakfast.    losartan (COZAAR) 25 MG tablet Take 25 mg by mouth daily.   nitroGLYCERIN (NITROSTAT) 0.4 MG SL tablet Place under the tongue.   ticagrelor (BRILINTA) 90 MG TABS tablet Take 1 tablet (90 mg total) by mouth 2 (two) times daily.            Past Medical History:  Diagnosis Date   Arthritis     Colitis     D[place]   HPV (human papilloma virus) infection     Hyperlipidemia     Ischemia of left lower extremity     Osteoporosis     Polymyalgia rheumatica (HCC)     Thyroid disease     Vaginal dysplasia             Past Surgical History:  Procedure Laterality Date   ABDOMINAL HYSTERECTOMY        complete   COLONOSCOPY WITH PROPOFOL N/A 10/04/2020    Procedure: COLONOSCOPY WITH PROPOFOL;  Surgeon: Toledo, Boykin Nearing, MD;  Location: [facility]OSCOPY;  Service: Gastroenterology;  Laterality: N/A;   colposcobpy       COLPOSCOPY       CORONARY STENT INTERVENTION N/A 11/15/2020    Procedure: CORONARY STENT INTERVENTION;  Surgeon: Alwyn Pea, MD;  Location: [facility];  Service: Cardiovascular;  Laterality: N/A;   knee athroscopy  Left     LAPAROSCOPIC HYSTERECTOMY       LEFT HEART CATH AND CORONARY ANGIOGRAPHY N/A 11/15/2020    Procedure: LEFT HEART CATH AND CORONARY ANGIOGRAPHY;  Surgeon: Alwyn Pea, MD;  Location: [facility];  Service: Cardiovascular;  Laterality: N/A;   LOWER EXTREMITY ANGIOGRAPHY Left 09/05/2020    Procedure: LOWER EXTREMITY ANGIOGRAPHY;  Surgeon: Renford Dills, MD;  Location: [facility];  Service: Cardiovascular;  Laterality: Left;   LOWER EXTREMITY ANGIOGRAPHY Left 04/10/2021    Procedure: LOWER EXTREMITY ANGIOGRAPHY;  Surgeon: Renford Dills, MD;  Location: [facility];  Service: Cardiovascular;  Laterality: Left;   LOWER EXTREMITY ANGIOGRAPHY Left 11/20/2021     Procedure: Lower Extremity Angiography;  Surgeon: Renford Dills, MD;  Location: [facility];  Service: Cardiovascular;  Laterality: Left;   LOWER EXTREMITY ANGIOGRAPHY Left 06/05/2022    Procedure: Lower Extremity Angiography;  Surgeon: Renford Dills, MD;  Location: [facility];  Service: Cardiovascular;  Laterality: Left;      Social History Social History         Tobacco Use   Smoking status: Never   Smokeless tobacco: Never  Vaping Use   Vaping Use: Never used  Substance Use Topics   Alcohol use: Yes      Alcohol/week: 3.0 standard drinks of alcohol      Types: 3 Glasses of wine per week      Comment: 3 glasses of wine per week    Drug use: Never      Family History      Family History  Problem Relation Age of Onset   Cancer Mother     Pancreatic cancer Mother     Heart disease Father             Allergies  Allergen Reactions   Lactose Other (See Comments)      Gi upset and diarrhea   Other reaction(s): Other (See Comments) Gi upset and diarrhea Gi upset and diarrhea Gi upset and diarrhea        REVIEW OF SYSTEMS (Negative unless checked)   Constitutional: [] Weight loss  [] Fever  [] Chills Cardiac: [] Chest pain   [] Chest pressure   [] Palpitations   [] Shortness of breath when laying flat   [] Shortness of breath with exertion. Vascular:  [x] Pain in legs with walking   [] Pain in legs at rest  [] History of DVT   [] Phlebitis   [] Swelling in legs   [] Varicose veins   [] Non-healing ulcers Pulmonary:   [] Uses home oxygen   [] Productive cough   [] Hemoptysis   [] Wheeze  [] COPD   [] Asthma Neurologic:  [] Dizziness   [] Seizures   [] History of stroke   [] History of TIA  [] Aphasia   [] Vissual changes   [] Weakness or numbness in arm   [] Weakness or numbness in leg Musculoskeletal:   [] Joint swelling   [] Joint pain   [] Low back pain Hematologic:  [] Easy bruising  [] Easy bleeding   [] Hypercoagulable state   [] Anemic Gastrointestinal:  [] Diarrhea    [] Vomiting  [] Gastroesophageal reflux/heartburn   [] Difficulty swallowing. Genitourinary:  [] Chronic kidney disease   [] Difficult urination  [] Frequent urination   [] Blood in urine Skin:  [] Rashes   [] Ulcers  Psychological:  [] History of anxiety   []  History of major depression.   Physical Examination      Vitals:    07/23/22 0752  BP: (!) 153/79  Pulse: 81  Resp: 20  Temp: 98.4 F (36.9  C)  TempSrc: Oral  SpO2: 99%  Weight: 62.6 kg  Height: 5\' 8"  (1.727 m)    Body mass index is 20.98 kg/m. Gen: WD/WN, NAD Head: [place]/AT, No temporalis wasting.  Ear/Nose/Throat: Hearing grossly intact, nares w/o erythema or drainage Eyes: PER, EOMI, sclera nonicteric.  Neck: Supple, no masses.  No bruit or JVD.  Pulmonary:  Good air movement, no audible wheezing, no use of accessory muscles.  Cardiac: RRR, normal S1, S2, no Murmurs. Vascular:  mild trophic changes, no open wounds Vessel Right Left  Radial Palpable Palpable  PT  Palpable  Palpable  DP  Palpable Not Palpable  Gastrointestinal: soft, non-distended. No guarding/no peritoneal signs.  Musculoskeletal: M/S 5/5 throughout.  No visible deformity.  Neurologic: CN 2-12 intact. Pain and light touch intact in extremities.  Symmetrical.  Speech is fluent. Motor exam as listed above. Psychiatric: Judgment intact, Mood & affect appropriate for pt's clinical situation. Dermatologic: No rashes or ulcers noted.  No changes consistent with cellulitis.     CBC Recent Labs       Lab Results  Component Value Date    WBC 6.5 04/11/2021    HGB 12.2 04/11/2021    HCT 36.6 04/11/2021    MCV 97.9 04/11/2021    PLT 167 04/11/2021        BMET Labs (Brief)          Component Value Date/Time    BUN 17 06/05/2022 1057    CREATININE 0.59 06/05/2022 1057    GFRNONAA >60 06/05/2022 1057      CrCl cannot be calculated (Patient's most recent lab result is older than the maximum 21 days allowed.).   COAG Recent Labs  No results found for:  "INR", "PROTIME"     Radiology Imaging Results  No results found.       Assessment/Plan 1. 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  The patient reports that the claudication symptoms and leg pain has improved.   The patient denies lifestyle limiting changes at this point in time.  However, she has re-stenosed multiple times and in relatively short order.  Her vein is not ideal for a bypass so I would save the bypass option for a more extreme circumstance and continue to try to push intervention.  Also, given that she is going out of the country in November I have recommended an angiogram prior to her leaving the country.  She agrees with this plan.   No further invasive studies, angiography or surgery at this time The patient should continue walking and begin a more formal exercise program.  The patient should continue antiplatelet therapy and aggressive treatment of the lipid abnormalities   Continued surveillance is indicated as atherosclerosis is likely to progress with time.     Patient should undergo noninvasive studies as ordered as well as the angiogram as noted above.   The patient will follow up with me to review the studies.     2. Chronic venous insufficiency No surgery or intervention at this point in time.     I have discussed with the patient venous insufficiency and why it  causes symptoms. I have discussed with the patient the chronic skin changes that accompany venous insufficiency and the long term sequela such as infection and ulceration.  Patient will begin wearing graduated compression stockings or compression wraps on a daily basis.  The patient will put  the compression on first thing in the morning and removing them in the evening. The patient is instructed specifically not to sleep in the compression.     In addition, behavioral  modification including several periods of elevation of the lower extremities during the day will be continued. I have demonstrated that proper elevation is a position with the ankles at heart level.   The patient is instructed to begin routine exercise, especially walking on a daily basis   The patient will be assessed for a Lymph Pump depending on the effectiveness of conservative therapy and the control of the associated lymphedema.    3. 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   Continue statin as ordered and reviewed, no changes at this time   Nitrates PRN for chest pain    4. Mixed hyperlipidemia Continue statin as ordered and reviewed, no changes at this time    5. Hypothyroidism, unspecified type Continue hormone replacement as ordered and reviewed, no changes at this time      Levora Dredge, MD   07/23/2022 8:17 AM

## 2022-07-23 NOTE — Op Note (Signed)
[facility]  Percutaneous Study/Intervention Procedural Note   Date of Surgery: 07/23/2022,10:39 AM  Surgeon:Statia Burdick, Latina Craver   Pre-operative Diagnosis: Atherosclerotic occlusive disease bilateral lower extremities with claudication of the left lower extremity and multiple recurrent stenoses of the popliteal stent  Post-operative diagnosis:  Same  Procedure(s) Performed:  1.  Abdominal aortogram  2.  Selective injection of the left lower extremity third order catheter placement  3.  Ultrasound-guided access to the right common femoral artery  4.  StarClose right femoral artery    Anesthesia: Conscious sedation was administered by the interventional radiology RN under my direct supervision. IV Versed plus fentanyl were utilized. Continuous ECG, pulse oximetry and blood pressure was monitored throughout the entire procedure.  Conscious sedation was administered for a total of 50 minutes and 3 seconds.  Sheath: 5 French 11 cm Pinnacle sheath retrograde right common femoral  Contrast: 30 cc   Fluoroscopy Time: 4.6 minutes  Indications:  The patient presents to [facility] with atherosclerotic occlusive disease left lower extremities with numerous recurrences.  Pedal pulses are nonpalpable bilaterally suggesting hemodynamically significant atherosclerotic occlusive disease.  The risks and benefits as well as alternative therapies for lower extremity revascularization are reviewed with the patient all questions are answered the patient agrees to proceed.  The patient is therefore undergoing angiography with the hope for intervention for limb salvage.   Procedure:  Maisley Hainsworth a 81 y.o. female who was identified and appropriate procedural time out was performed.  The patient was then placed supine on the table and prepped and draped in the usual sterile fashion.  Ultrasound was used to evaluate the right common femoral artery.  It was echolucent and  pulsatile indicating it is patent .  An ultrasound image was acquired for the permanent record.  A micropuncture needle was used to access the right common femoral artery under direct ultrasound guidance.  The microwire was then advanced under fluoroscopic guidance without difficulty followed by the micro-sheath.  A 0.035 J wire was advanced without resistance and a 5Fr sheath was placed.    Pigtail catheter was then advanced to the level of L3 and RAO projection of the aorta was obtained. Stiff angled Glidewire and pigtail catheter was then used across the bifurcation and the catheter was positioned in the distal external iliac artery.  LAO of the left groin was then obtained. Wire was reintroduced and negotiated into the SFA and the catheter was advanced into the SFA. Distal runoff was then performed.  After review of the images the catheter was removed over wire and an RAO view of the groin was obtained. StarClose device was deployed without difficulty.   Findings:   Aortogram: The abdominal aorta is not formally imaged as she has had a recent angiogram and there are no hemodynamically significant stenoses identified within the distal aorta.  The aortic bifurcation is mildly diseased but widely patent.  Bilateral common internal and external iliac arteries are free of hemodynamically significant lesions.  Left lower Extremity: The left common femoral, profunda femoris superficial femoral and popliteal arteries demonstrate mild atherosclerotic changes but there are no hemodynamically significant lesions.  The previously placed distal SFA and popliteal stents are wide open with less than 5% residual stenosis and/or recurrent stenosis noted.  The trifurcation is patent and there is no evidence of hemodynamically significant recurrence within the TP trunk and proximal posterior tibial.  There is 3 vessel runoff to the foot.  SUMMARY: Based on these images no intervention  is performed at this  time.    Disposition: Patient was taken to the recovery room in stable condition having tolerated the procedure well.  Belenda Cruise Meenakshi Sazama 07/23/2022,10:39 AM

## 2022-07-23 NOTE — Progress Notes (Signed)
MRN : [id number]   Melanie Browning is a 81 y.o. (1940/11/17) female who presents with chief complaint of check circulation.   History of Present Illness:    The patient presents to [facility] for angiography of the left lower extremity.   The patient was last seen the office on 06/20/2022, for followup and review status post angiogram with intervention on 06/05/2022.    Procedure:  Rota Rex mechanical thrombectomy of the left SFA and popliteal 2.    Percutaneous transluminal angioplasty and stent placement left superficial femoral and popliteal arteries to 5 mm maximally 3.    Percutaneous transluminal angioplasty left posterior tibial to approximately 3.5 mm   The patient notes the initial improvement in the lower extremity symptoms has decreased. Interval shortening of the patient's claudication distance but no rest pain symptoms. No new ulcers or wounds have occurred since the last visit.  Her previous reconstructions have been short lived and she is going out of the country to [place] and will not have medical care available.   There have been no significant changes to the patient's overall health care.   No documented history of amaurosis fugax or recent TIA symptoms. There are no recent neurological changes noted. No documented history of DVT, PE or superficial thrombophlebitis. The patient denies recent episodes of angina or shortness of breath.    Previous duplex US of the left lower extremity arterial system shows widely patent arterial system popliteal stent does not demonstrate any hemodynamic shifts or narrowings.  There appears to be three-vessel runoff to the foot.   ABI's from last visit Rt=1.10 and Lt=1.06 (previous Rt=1.10 and Lt=0.54)   Vein mapping of the left GSV shows a patent but not ideal vein for bypass.   Active Medications      Current Meds  Medication Sig   aspirin EC 81 MG tablet Take 81 mg by mouth every evening. Swallow whole.   citalopram (CELEXA) 10 MG tablet Take  10 mg by mouth daily.   levothyroxine (SYNTHROID) 75 MCG tablet Take 75 mcg by mouth daily before breakfast.    losartan (COZAAR) 25 MG tablet Take 25 mg by mouth daily.   nitroGLYCERIN (NITROSTAT) 0.4 MG SL tablet Place under the tongue.   ticagrelor (BRILINTA) 90 MG TABS tablet Take 1 tablet (90 mg total) by mouth 2 (two) times daily.            Past Medical History:  Diagnosis Date   Arthritis     Colitis     [place]   HPV (human papilloma virus) infection     Hyperlipidemia     Ischemia of left lower extremity     Osteoporosis     Polymyalgia rheumatica (HCC)     Thyroid disease     Vaginal dysplasia             Past Surgical History:  Procedure Laterality Date   ABDOMINAL HYSTERECTOMY        complete   COLONOSCOPY WITH PROPOFOL N/A 10/04/2020    Procedure: COLONOSCOPY WITH PROPOFOL;  Surgeon: Toledo, Boykin Nearing, MD;  Location: [facility];  Service: Gastroenterology;  Laterality: N/A;   colposcobpy       COLPOSCOPY       CORONARY STENT INTERVENTION N/A 11/15/2020    Procedure: CORONARY STENT INTERVENTION;  Surgeon: Alwyn Pea, MD;  Location: [facility];  Service: Cardiovascular;  Laterality: N/A;   knee athroscopy  Left     LAPAROSCOPIC HYSTERECTOMY       LEFT HEART CATH AND CORONARY ANGIOGRAPHY N/A 11/15/2020    Procedure: LEFT HEART CATH AND CORONARY ANGIOGRAPHY;  Surgeon: Alwyn Pea, MD;  Location: [facility];  Service: Cardiovascular;  Laterality: N/A;   LOWER EXTREMITY ANGIOGRAPHY Left 09/05/2020    Procedure: LOWER EXTREMITY ANGIOGRAPHY;  Surgeon: Renford Dills, MD;  Location: [facility];  Service: Cardiovascular;  Laterality: Left;   LOWER EXTREMITY ANGIOGRAPHY Left 04/10/2021    Procedure: LOWER EXTREMITY ANGIOGRAPHY;  Surgeon: Renford Dills, MD;  Location: [facility];  Service: Cardiovascular;  Laterality: Left;   LOWER EXTREMITY ANGIOGRAPHY Left 11/20/2021     Procedure: Lower Extremity Angiography;  Surgeon: Renford Dills, MD;  Location: [facility];  Service: Cardiovascular;  Laterality: Left;   LOWER EXTREMITY ANGIOGRAPHY Left 06/05/2022    Procedure: Lower Extremity Angiography;  Surgeon: Renford Dills, MD;  Location: [facility];  Service: Cardiovascular;  Laterality: Left;      Social History Social History         Tobacco Use   Smoking status: Never   Smokeless tobacco: Never  Vaping Use   Vaping Use: Never used  Substance Use Topics   Alcohol use: Yes      Alcohol/week: 3.0 standard drinks of alcohol      Types: 3 Glasses of wine per week      Comment: 3 glasses of wine per week    Drug use: Never      Family History      Family History  Problem Relation Age of Onset   Cancer Mother     Pancreatic cancer Mother     Heart disease Father             Allergies  Allergen Reactions   Lactose Other (See Comments)      Gi upset and diarrhea   Other reaction(s): Other (See Comments) Gi upset and diarrhea Gi upset and diarrhea Gi upset and diarrhea        REVIEW OF SYSTEMS (Negative unless checked)   Constitutional: [] Weight loss  [] Fever  [] Chills Cardiac: [] Chest pain   [] Chest pressure   [] Palpitations   [] Shortness of breath when laying flat   [] Shortness of breath with exertion. Vascular:  [x] Pain in legs with walking   [] Pain in legs at rest  [] History of DVT   [] Phlebitis   [] Swelling in legs   [] Varicose veins   [] Non-healing ulcers Pulmonary:   [] Uses home oxygen   [] Productive cough   [] Hemoptysis   [] Wheeze  [] COPD   [] Asthma Neurologic:  [] Dizziness   [] Seizures   [] History of stroke   [] History of TIA  [] Aphasia   [] Vissual changes   [] Weakness or numbness in arm   [] Weakness or numbness in leg Musculoskeletal:   [] Joint swelling   [] Joint pain   [] Low back pain Hematologic:  [] Easy bruising  [] Easy bleeding   [] Hypercoagulable state   [] Anemic Gastrointestinal:  [] Diarrhea    [] Vomiting  [] Gastroesophageal reflux/heartburn   [] Difficulty swallowing. Genitourinary:  [] Chronic kidney disease   [] Difficult urination  [] Frequent urination   [] Blood in urine Skin:  [] Rashes   [] Ulcers  Psychological:  [] History of anxiety   []  History of major depression.   Physical Examination      Vitals:    07/23/22 0752  BP: (!) 153/79  Pulse: 81  Resp: 20  Temp: 98.4 F (36.9  C)  TempSrc: Oral  SpO2: 99%  Weight: 62.6 kg  Height: 5\' 8"  (1.727 m)    Body mass index is 20.98 kg/m. Gen: WD/WN, NAD Head: [place]/AT, No temporalis wasting.  Ear/Nose/Throat: Hearing grossly intact, nares w/o erythema or drainage Eyes: PER, EOMI, sclera nonicteric.  Neck: Supple, no masses.  No bruit or JVD.  Pulmonary:  Good air movement, no audible wheezing, no use of accessory muscles.  Cardiac: RRR, normal S1, S2, no Murmurs. Vascular:  mild trophic changes, no open wounds Vessel Right Left  Radial Palpable Palpable  PT  Palpable  Palpable  DP  Palpable Not Palpable  Gastrointestinal: soft, non-distended. No guarding/no peritoneal signs.  Musculoskeletal: M/S 5/5 throughout.  No visible deformity.  Neurologic: CN 2-12 intact. Pain and light touch intact in extremities.  Symmetrical.  Speech is fluent. Motor exam as listed above. Psychiatric: Judgment intact, Mood & affect appropriate for pt's clinical situation. Dermatologic: No rashes or ulcers noted.  No changes consistent with cellulitis.     CBC Recent Labs       Lab Results  Component Value Date    WBC 6.5 04/11/2021    HGB 12.2 04/11/2021    HCT 36.6 04/11/2021    MCV 97.9 04/11/2021    PLT 167 04/11/2021        BMET Labs (Brief)          Component Value Date/Time    BUN 17 06/05/2022 1057    CREATININE 0.59 06/05/2022 1057    GFRNONAA >60 06/05/2022 1057      CrCl cannot be calculated (Patient's most recent lab result is older than the maximum 21 days allowed.).   COAG Recent Labs  No results found for:  "INR", "PROTIME"     Radiology Imaging Results  No results found.       Assessment/Plan 1. 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  The patient reports that the claudication symptoms and leg pain has improved.   The patient denies lifestyle limiting changes at this point in time.  However, she has re-stenosed multiple times and in relatively short order.  Her vein is not ideal for a bypass so I would save the bypass option for a more extreme circumstance and continue to try to push intervention.  Also, given that she is going out of the country in November I have recommended an angiogram prior to her leaving the country.  She agrees with this plan.   No further invasive studies, angiography or surgery at this time The patient should continue walking and begin a more formal exercise program.  The patient should continue antiplatelet therapy and aggressive treatment of the lipid abnormalities   Continued surveillance is indicated as atherosclerosis is likely to progress with time.     Patient should undergo noninvasive studies as ordered as well as the angiogram as noted above.   The patient will follow up with me to review the studies.     2. Chronic venous insufficiency No surgery or intervention at this point in time.     I have discussed with the patient venous insufficiency and why it  causes symptoms. I have discussed with the patient the chronic skin changes that accompany venous insufficiency and the long term sequela such as infection and ulceration.  Patient will begin wearing graduated compression stockings or compression wraps on a daily basis.  The patient will put  the compression on first thing in the morning and removing them in the evening. The patient is instructed specifically not to sleep in the compression.     In addition, behavioral  modification including several periods of elevation of the lower extremities during the day will be continued. I have demonstrated that proper elevation is a position with the ankles at heart level.   The patient is instructed to begin routine exercise, especially walking on a daily basis   The patient will be assessed for a Lymph Pump depending on the effectiveness of conservative therapy and the control of the associated lymphedema.    3. 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   Continue statin as ordered and reviewed, no changes at this time   Nitrates PRN for chest pain    4. Mixed hyperlipidemia Continue statin as ordered and reviewed, no changes at this time    5. Hypothyroidism, unspecified type Continue hormone replacement as ordered and reviewed, no changes at this time      Levora Dredge, MD   07/23/2022 8:17 AM

## 2022-07-23 NOTE — Op Note (Deleted)
@LOGO @   MRN : [id number]  Melanie Browning is a 81 y.o. ([birth year]) female who presents with chief complaint of check circulation.  History of Present Illness:   The patient presents to [facility] for angiography of the left lower extremity.  The patient was last seen the office on 06/20/2022, for followup and review status post angiogram with intervention on 06/05/2022.    Procedure:  Rota Rex mechanical thrombectomy of the left SFA and popliteal 2.    Percutaneous transluminal angioplasty and stent placement left superficial femoral and popliteal arteries to 5 mm maximally 3.    Percutaneous transluminal angioplasty left posterior tibial to approximately 3.5 mm   The patient notes the initial improvement in the lower extremity symptoms has decreased. Interval shortening of the patient's claudication distance but no rest pain symptoms. No new ulcers or wounds have occurred since the last visit.  Her previous reconstructions have been short lived and she is going out of the country to [place] and will not have medical care available.   There have been no significant changes to the patient's overall health care.   No documented history of amaurosis fugax or recent TIA symptoms. There are no recent neurological changes noted. No documented history of DVT, PE or superficial thrombophlebitis. The patient denies recent episodes of angina or shortness of breath.    Previous duplex US of the left lower extremity arterial system shows widely patent arterial system popliteal stent does not demonstrate any hemodynamic shifts or narrowings.  There appears to be three-vessel runoff to the foot.   ABI's from last visit Rt=1.10 and Lt=1.06 (previous Rt=1.10 and Lt=0.54)   Vein mapping of the left GSV shows a patent but not ideal vein for bypass.  Current Meds  Medication Sig   aspirin EC 81 MG tablet Take 81 mg by mouth every evening. Swallow whole.   citalopram (CELEXA) 10 MG tablet Take 10  mg by mouth daily.   levothyroxine (SYNTHROID) 75 MCG tablet Take 75 mcg by mouth daily before breakfast.    losartan (COZAAR) 25 MG tablet Take 25 mg by mouth daily.   nitroGLYCERIN (NITROSTAT) 0.4 MG SL tablet Place under the tongue.   ticagrelor (BRILINTA) 90 MG TABS tablet Take 1 tablet (90 mg total) by mouth 2 (two) times daily.    Past Medical History:  Diagnosis Date   Arthritis    Colitis    D[place]   HPV (human papilloma virus) infection    Hyperlipidemia    Ischemia of left lower extremity    Osteoporosis    Polymyalgia rheumatica (HCC)    Thyroid disease    Vaginal dysplasia     Past Surgical History:  Procedure Laterality Date   ABDOMINAL HYSTERECTOMY     complete   COLONOSCOPY WITH PROPOFOL N/A 10/04/2020   Procedure: COLONOSCOPY WITH PROPOFOL;  Surgeon: Toledo, Boykin Nearing, MD;  Location: [facility];  Service: Gastroenterology;  Laterality: N/A;   colposcobpy     COLPOSCOPY     CORONARY STENT INTERVENTION N/A 11/15/2020   Procedure: CORONARY STENT INTERVENTION;  Surgeon: Alwyn Pea, MD;  Location: [facility];  Service: Cardiovascular;  Laterality: N/A;   knee athroscopy Left    LAPAROSCOPIC HYSTERECTOMY     LEFT HEART CATH AND CORONARY ANGIOGRAPHY N/A 11/15/2020   Procedure: LEFT HEART CATH AND CORONARY ANGIOGRAPHY;  Surgeon: Alwyn Pea,  MD;  Location: [facility];  Service: Cardiovascular;  Laterality: N/A;   LOWER EXTREMITY ANGIOGRAPHY Left 09/05/2020   Procedure: LOWER EXTREMITY ANGIOGRAPHY;  Surgeon: Renford Dills, MD;  Location: [facility];  Service: Cardiovascular;  Laterality: Left;   LOWER EXTREMITY ANGIOGRAPHY Left 04/10/2021   Procedure: LOWER EXTREMITY ANGIOGRAPHY;  Surgeon: Renford Dills, MD;  Location: [facility];  Service: Cardiovascular;  Laterality: Left;   LOWER EXTREMITY ANGIOGRAPHY Left 11/20/2021   Procedure: Lower Extremity Angiography;   Surgeon: Renford Dills, MD;  Location: [facility];  Service: Cardiovascular;  Laterality: Left;   LOWER EXTREMITY ANGIOGRAPHY Left 06/05/2022   Procedure: Lower Extremity Angiography;  Surgeon: Renford Dills, MD;  Location: [facility];  Service: Cardiovascular;  Laterality: Left;    Social History Social History   Tobacco Use   Smoking status: Never   Smokeless tobacco: Never  Vaping Use   Vaping Use: Never used  Substance Use Topics   Alcohol use: Yes    Alcohol/week: 3.0 standard drinks of alcohol    Types: 3 Glasses of wine per week    Comment: 3 glasses of wine per week    Drug use: Never    Family History Family History  Problem Relation Age of Onset   Cancer Mother    Pancreatic cancer Mother    Heart disease Father     Allergies  Allergen Reactions   Lactose Other (See Comments)    Gi upset and diarrhea  Other reaction(s): Other (See Comments) Gi upset and diarrhea Gi upset and diarrhea Gi upset and diarrhea     REVIEW OF SYSTEMS (Negative unless checked)  Constitutional: [] Weight loss  [] Fever  [] Chills Cardiac: [] Chest pain   [] Chest pressure   [] Palpitations   [] Shortness of breath when laying flat   [] Shortness of breath with exertion. Vascular:  [x] Pain in legs with walking   [] Pain in legs at rest  [] History of DVT   [] Phlebitis   [] Swelling in legs   [] Varicose veins   [] Non-healing ulcers Pulmonary:   [] Uses home oxygen   [] Productive cough   [] Hemoptysis   [] Wheeze  [] COPD   [] Asthma Neurologic:  [] Dizziness   [] Seizures   [] History of stroke   [] History of TIA  [] Aphasia   [] Vissual changes   [] Weakness or numbness in arm   [] Weakness or numbness in leg Musculoskeletal:   [] Joint swelling   [] Joint pain   [] Low back pain Hematologic:  [] Easy bruising  [] Easy bleeding   [] Hypercoagulable state   [] Anemic Gastrointestinal:  [] Diarrhea   [] Vomiting  [] Gastroesophageal reflux/heartburn   [] Difficulty  swallowing. Genitourinary:  [] Chronic kidney disease   [] Difficult urination  [] Frequent urination   [] Blood in urine Skin:  [] Rashes   [] Ulcers  Psychological:  [] History of anxiety   []  History of major depression.  Physical Examination  Vitals:   07/23/22 0752  BP: (!) 153/79  Pulse: 81  Resp: 20  Temp: 98.4 F (36.9 C)  TempSrc: Oral  SpO2: 99%  Weight: 62.6 kg  Height: 5\' 8"  (1.727 m)   Body mass index is 20.98 kg/m. Gen: WD/WN, NAD Head: /AT, No temporalis wasting.  Ear/Nose/Throat: Hearing grossly intact, nares w/o erythema or drainage Eyes: PER, EOMI, sclera nonicteric.  Neck: Supple, no masses.  No bruit or JVD.  Pulmonary:  Good air movement, no audible wheezing, no use of accessory muscles.  Cardiac: RRR, normal S1, S2, no Murmurs. Vascular:  mild trophic  changes, no open wounds Vessel Right Left  Radial Palpable Palpable  PT  Palpable  Palpable  DP  Palpable Not Palpable  Gastrointestinal: soft, non-distended. No guarding/no peritoneal signs.  Musculoskeletal: M/S 5/5 throughout.  No visible deformity.  Neurologic: CN 2-12 intact. Pain and light touch intact in extremities.  Symmetrical.  Speech is fluent. Motor exam as listed above. Psychiatric: Judgment intact, Mood & affect appropriate for pt's clinical situation. Dermatologic: No rashes or ulcers noted.  No changes consistent with cellulitis.   CBC Lab Results  Component Value Date   WBC 6.5 04/11/2021   HGB 12.2 04/11/2021   HCT 36.6 04/11/2021   MCV 97.9 04/11/2021   PLT 167 04/11/2021    BMET    Component Value Date/Time   BUN 17 06/05/2022 1057   CREATININE 0.59 06/05/2022 1057   GFRNONAA >60 06/05/2022 1057   CrCl cannot be calculated (Patient's most recent lab result is older than the maximum 21 days allowed.).  COAG No results found for: "INR", "PROTIME"  Radiology No results found.   Assessment/Plan 1. Atherosclerosis of native artery of left lower extremity with  intermittent claudication (HCC) Recommend:   The patient is status post successful angiogram with intervention.  The patient reports that the claudication symptoms and leg pain has improved.   The patient denies lifestyle limiting changes at this point in time.  However, she has re-stenosed multiple times and in relatively short order.  Her vein is not ideal for a bypass so I would save the bypass option for a more extreme circumstance and continue to try to push intervention.  Also, given that she is going out of the country in [place] I have recommended an angiogram prior to her leaving the country.  She agrees with this plan.   No further invasive studies, angiography or surgery at this time The patient should continue walking and begin a more formal exercise program.  The patient should continue antiplatelet therapy and aggressive treatment of the lipid abnormalities   Continued surveillance is indicated as atherosclerosis is likely to progress with time.     Patient should undergo noninvasive studies as ordered as well as the angiogram as noted above.   The patient will follow up with me to review the studies.     2. Chronic venous insufficiency No surgery or intervention at this point in time.     I have discussed with the patient venous insufficiency and why it  causes symptoms. I have discussed with the patient the chronic skin changes that accompany venous insufficiency and the long term sequela such as infection and ulceration.  Patient will begin wearing graduated compression stockings or compression wraps on a daily basis.  The patient will put the compression on first thing in the morning and removing them in the evening. The patient is instructed specifically not to sleep in the compression.     In addition, behavioral modification including several periods of elevation of the lower extremities during the day will be continued. I have demonstrated that proper elevation is a  position with the ankles at heart level.   The patient is instructed to begin routine exercise, especially walking on a daily basis   The patient will be assessed for a Lymph Pump depending on the effectiveness of conservative therapy and the control of the associated lymphedema.    3. Coronary artery disease involving native coronary artery of native heart without angina pectoris Continue cardiac and antihypertensive medications as already ordered and reviewed,  no changes at this time.   Continue statin as ordered and reviewed, no changes at this time   Nitrates PRN for chest pain    4. Mixed hyperlipidemia Continue statin as ordered and reviewed, no changes at this time    5. Hypothyroidism, unspecified type Continue hormone replacement as ordered and reviewed, no changes at this time    Levora Dredge, MD  07/23/2022 8:17 AM

## 2022-07-23 NOTE — Discharge Instructions (Signed)
Follow up with Dr Delana Meyer at his office on December 7, at 1:45 pm.  This appointment is the soonest he can see you after your travels and is similar in nature to the appointment that is scheduled for January.  Please confirm the need for the January appointment at your December appointment.

## 2022-07-23 NOTE — Interval H&P Note (Signed)
History and Physical Interval Note:  07/23/2022 8:26 AM  Melanie Browning  has presented today for surgery, with the diagnosis of LLE Angio  BARD   ASO w claudication.  The various methods of treatment have been discussed with the patient and family. After consideration of risks, benefits and other options for treatment, the patient has consented to  Procedure(s): Lower Extremity Angiography (Left) as a surgical intervention.  The patient's history has been reviewed, patient examined, no change in status, stable for surgery.  I have reviewed the patient's chart and labs.  Questions were answered to the patient's satisfaction.     [name]

## 2022-07-24 ENCOUNTER — Other Ambulatory Visit (INDEPENDENT_AMBULATORY_CARE_PROVIDER_SITE_OTHER): Payer: Self-pay

## 2022-07-24 ENCOUNTER — Encounter: Payer: Self-pay | Admitting: Vascular Surgery

## 2022-07-24 MED ORDER — TICAGRELOR 90 MG PO TABS: 90.0000 mg | ORAL_TABLET | Freq: Two times a day (BID) | ORAL | 0 refills | Status: DC

## 2022-07-29 ENCOUNTER — Encounter (INDEPENDENT_AMBULATORY_CARE_PROVIDER_SITE_OTHER): Payer: Self-pay

## 2022-09-03 ENCOUNTER — Other Ambulatory Visit (INDEPENDENT_AMBULATORY_CARE_PROVIDER_SITE_OTHER): Payer: Self-pay | Admitting: Vascular Surgery

## 2022-09-03 DIAGNOSIS — I739 Peripheral vascular disease, unspecified: Secondary | ICD-10-CM

## 2022-09-04 NOTE — Progress Notes (Signed)
MRN : [id number]  Melanie Browning is a 81 y.o. (13-Dec-1940) female who presents with chief complaint of check circulation.  History of Present Illness:  The patient presents to [facility] for angiography of the left lower extremity.   The patient was last seen the office on 06/20/2022, for followup and review status post angiogram with intervention on 06/05/2022.    Procedure:  [place] mechanical thrombectomy of the left SFA and popliteal 2.    Percutaneous transluminal angioplasty and stent placement left superficial femoral and popliteal arteries to 5 mm maximally 3.    Percutaneous transluminal angioplasty left posterior tibial to approximately 3.5 mm  Follow-up angiography dated July 23, 2022 showed widely patent reconstruction no evidence for recurrent hyperplastic stenosis.   The patient notes the initial improvement in the lower extremity symptoms has decreased. Interval shortening of the patient's claudication distance but no rest pain symptoms. No new ulcers or wounds have occurred since the last visit.  Her previous reconstructions have been short lived and she is going out of the country to [place] and will not have medical care available.   There have been no significant changes to the patient's overall health care.   No documented history of amaurosis fugax or recent TIA symptoms. There are no recent neurological changes noted. No documented history of DVT, PE or superficial thrombophlebitis. The patient denies recent episodes of angina or shortness of breath.    Previous duplex US of the left lower extremity arterial system shows widely patent arterial system popliteal stent does not demonstrate any hemodynamic shifts or narrowings.  There appears to be three-vessel runoff to the foot.   ABI's from last visit Rt=1.10 and Lt=1.06 (previous Rt=1.10 and Lt=0.54)   Vein mapping of the left GSV shows a patent but not ideal vein for bypass.    No outpatient  medications have been marked as taking for the 09/05/22 encounter (Appointment) with Gilda Crease, Latina Craver, MD.    Past Medical History:  Diagnosis Date   Arthritis    Colitis    [place]   HPV (human papilloma virus) infection    Hyperlipidemia    Ischemia of left lower extremity    Osteoporosis    Polymyalgia rheumatica (HCC)    Thyroid disease    Vaginal dysplasia     Past Surgical History:  Procedure Laterality Date   ABDOMINAL HYSTERECTOMY     complete   COLONOSCOPY WITH PROPOFOL N/A 10/04/2020   Procedure: COLONOSCOPY WITH PROPOFOL;  Surgeon: Toledo, Boykin Nearing, MD;  Location: [facility] ENDOSCOPY;  Service: Gastroenterology;  Laterality: N/A;   colposcobpy     COLPOSCOPY     CORONARY STENT INTERVENTION N/A 11/15/2020   Procedure: CORONARY STENT INTERVENTION;  Surgeon: Alwyn Pea, MD;  Location: [facility] INVASIVE CV LAB;  Service: Cardiovascular;  Laterality: N/A;   knee athroscopy Left    LAPAROSCOPIC HYSTERECTOMY     LEFT HEART CATH AND CORONARY ANGIOGRAPHY N/A 11/15/2020   Procedure: LEFT HEART CATH AND CORONARY ANGIOGRAPHY;  Surgeon: Alwyn Pea, MD;  Location: [facility] INVAS[facility] CV LAB;  Service: Cardiovascular;  Laterality: N/A;   LOWER EXTREMITY ANGIOGRAPHY Left 09/05/2020   Procedure: LOWER EXTREMITY ANGIOGRAPHY;  Surgeon: Renford Dills, MD;  Location: [facility] INVASIVE CV [facility];  Service: Cardiovascular;  Laterality: Left;   LOWER EXTREMITY ANGIOGRAPHY Left 04/10/2021   Procedure: LOWER EXTREMITY ANGIOGRAPHY;  Surgeon: Renford Dills, MD;  Location: [facility] INVASIVE CV LAB;  Service: Cardiovascular;  Laterality: Left;   LOWER EXTREMITY ANGIOGRAPHY Left 11/20/2021   Procedure: Lower Extremity Angiography;  Surgeon: Renford Dills, MD;  Location: [facility] INVASIVE CV [facility];  Service: Cardiovascular;  Laterality: Left;   LOWER EXTREMITY ANGIOGRAPHY Left 06/05/2022   Procedure: Lower Extremity Angiography;  Surgeon: Renford Dills, MD;   Location: [facility] INVAS[facility] CV [facility];  Service: Cardiovascular;  Laterality: Left;   LOWER EXTREMITY ANGIOGRAPHY Left 07/23/2022   Procedure: Lower Extremity Angiography;  Surgeon: Renford Dills, MD;  Location: [facility] INVASIVE CV LAB;  Service: Cardiovascular;  Laterality: Left;    Social History Social History   Tobacco Use   Smoking status: Never   Smokeless tobacco: Never  Vaping Use   Vaping Use: Never used  Substance Use Topics   Alcohol use: Yes    Alcohol/week: 3.0 standard drinks of alcohol    Types: 3 Glasses of wine per week    Comment: 3 glasses of wine per week    Drug use: Never    Family History Family History  Problem Relation Age of Onset   Cancer Mother    Pancreatic cancer Mother    Heart disease Father     Allergies  Allergen Reactions   Lactose Other (See Comments)    Gi upset and diarrhea  Other reaction(s): Other (See Comments) Gi upset and diarrhea Gi upset and diarrhea Gi upset and diarrhea     REVIEW OF SYSTEMS (Negative unless checked)  Constitutional: [] Weight loss  [] Fever  [] Chills Cardiac: [] Chest pain   [] Chest pressure   [] Palpitations   [] Shortness of breath when laying flat   [] Shortness of breath with exertion. Vascular:  [x] Pain in legs with walking   [] Pain in legs at rest  [] History of DVT   [] Phlebitis   [] Swelling in legs   [] Varicose veins   [] Non-healing ulcers Pulmonary:   [] Uses home oxygen   [] Productive cough   [] Hemoptysis   [] Wheeze  [] COPD   [] Asthma Neurologic:  [] Dizziness   [] Seizures   [] History of stroke   [] History of TIA  [] Aphasia   [] Vissual changes   [] Weakness or numbness in arm   [] Weakness or numbness in leg Musculoskeletal:   [] Joint swelling   [] Joint pain   [] Low back pain Hematologic:  [] Easy bruising  [] Easy bleeding   [] Hypercoagulable state   [] Anemic Gastrointestinal:  [] Diarrhea   [] Vomiting  [] Gastroesophageal reflux/heartburn   [] Difficulty swallowing. Genitourinary:  [] Chronic kidney disease    [] Difficult urination  [] Frequent urination   [] Blood in urine Skin:  [] Rashes   [] Ulcers  Psychological:  [] History of anxiety   []  History of major depression.  Physical Examination  There were no vitals filed for this visit. There is no height or weight on file to calculate BMI. Gen: WD/WN, NAD Head: [place]/AT, No temporalis wasting.  Ear/Nose/Throat: Hearing grossly intact, nares w/o erythema or drainage Eyes: PER, EOMI, sclera nonicteric.  Neck: Supple, no masses.  No bruit or JVD.  Pulmonary:  Good air movement, no audible wheezing, no use of accessory muscles.  Cardiac: RRR, normal S1, S2, no Murmurs. Vascular:  mild trophic changes, no open wounds Vessel Right Left  Radial Palpable Palpable  PT  Palpable Palpable  DP Palpable Palpable  Gastrointestinal: soft, non-distended. No guarding/no peritoneal signs.  Musculoskeletal: M/S 5/5 throughout.  No visible deformity.  Neurologic: CN 2-12 intact. Pain and light touch intact in extremities.  Symmetrical.  Speech  is fluent. Motor exam as listed above. Psychiatric: Judgment intact, Mood & affect appropriate for pt's clinical situation. Dermatologic: No rashes or ulcers noted.  No changes consistent with cellulitis.   CBC Lab Results  Component Value Date   WBC 6.5 04/11/2021   HGB 12.2 04/11/2021   HCT 36.6 04/11/2021   MCV 97.9 04/11/2021   PLT 167 04/11/2021    BMET    Component Value Date/Time   BUN 19 07/23/2022 0810   CREATININE 0.69 07/23/2022 0810   GFRNONAA >60 07/23/2022 0810   CrCl cannot be calculated (Patient's most recent lab result is older than the maximum 21 days allowed.).  COAG No results found for: "INR", "PROTIME"  Radiology No results found.   Assessment/Plan 1. 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  The patient reports that the claudication symptoms and leg  pain has improved.   The patient denies lifestyle limiting changes at this point in time.  We seem to have found a good spot and both the recent angiogram and the noninvasive studies today are very good news.  No further invasive studies, angiography or surgery at this time The patient should continue walking and begin a more formal exercise program.  The patient should continue antiplatelet therapy and aggressive treatment of the lipid abnormalities  Continued surveillance is indicated as atherosclerosis is likely to progress with time.    Patient should undergo noninvasive studies as ordered. The patient will follow up with me to review the studies.  - VAS [place] LOWER EXTREMITY ARTERIAL DUPLEX; Future - VAS [place] ABI WITH/WO TBI; Future  2. Popliteal artery injury, left, sequela See #1 - VAS [place] LOWER EXTREMITY ARTERIAL DUPLEX; Future - VAS [place] ABI WITH/WO TBI; Future  3. Chronic venous insufficiency No surgery or intervention at this point in time.   The patient is CEAP [id number]   I have discussed with the patient venous insufficiency and why it  causes symptoms. I have discussed with the patient the chronic skin changes that accompany venous insufficiency and the long term sequela such as infection and ulceration.  Patient will begin wearing graduated compression stockings or compression wraps on a daily basis.  The patient will put the compression on first thing in the morning and removing them in the evening. The patient is instructed specifically not to sleep in the compression.    In addition, behavioral modification including several periods of elevation of the lower extremities during the day will be continued. I have demonstrated that proper elevation is a position with the ankles at heart level.  The patient is instructed to begin routine exercise, especially walking on a daily basis  The patient will be assessed for a Lymph Pump depending on the effectiveness of conservative therapy  and the control of the associated lymphedema.  4. Coronary artery disease of native artery of native heart with stable angina pectoris (HCC) Continue cardiac and antihypertensive medications as already ordered and reviewed, no changes at this time.  Continue statin as ordered and reviewed, no changes at this time  Nitrates PRN for chest pain  5. Mixed hyperlipidemia Continue statin as ordered and reviewed, no changes at this time    Levora Dredge, MD  09/04/2022 5:18 PM

## 2022-09-05 ENCOUNTER — Ambulatory Visit (INDEPENDENT_AMBULATORY_CARE_PROVIDER_SITE_OTHER): Payer: Medicare Other

## 2022-09-05 ENCOUNTER — Encounter (INDEPENDENT_AMBULATORY_CARE_PROVIDER_SITE_OTHER): Payer: Self-pay | Admitting: Vascular Surgery

## 2022-09-05 ENCOUNTER — Ambulatory Visit (INDEPENDENT_AMBULATORY_CARE_PROVIDER_SITE_OTHER): Payer: Medicare Other | Admitting: Vascular Surgery

## 2022-09-05 VITALS — BP 133/73 | HR 74 | Resp 17 | Ht 68.0 in | Wt 141.0 lb

## 2022-09-05 DIAGNOSIS — I739 Peripheral vascular disease, unspecified: Secondary | ICD-10-CM | POA: Diagnosis not present

## 2022-09-05 DIAGNOSIS — Z9889 Other specified postprocedural states: Secondary | ICD-10-CM

## 2022-09-05 DIAGNOSIS — I872 Venous insufficiency (chronic) (peripheral): Secondary | ICD-10-CM | POA: Diagnosis not present

## 2022-09-05 DIAGNOSIS — I70212 Atherosclerosis of native arteries of extremities with intermittent claudication, left leg: Secondary | ICD-10-CM | POA: Diagnosis not present

## 2022-09-05 DIAGNOSIS — S85002S Unspecified injury of popliteal artery, left leg, sequela: Secondary | ICD-10-CM

## 2022-09-05 DIAGNOSIS — I25118 Atherosclerotic heart disease of native coronary artery with other forms of angina pectoris: Secondary | ICD-10-CM | POA: Diagnosis not present

## 2022-09-05 DIAGNOSIS — I70223 Atherosclerosis of native arteries of extremities with rest pain, bilateral legs: Secondary | ICD-10-CM | POA: Diagnosis not present

## 2022-09-05 DIAGNOSIS — E782 Mixed hyperlipidemia: Secondary | ICD-10-CM

## 2022-09-08 ENCOUNTER — Encounter (INDEPENDENT_AMBULATORY_CARE_PROVIDER_SITE_OTHER): Payer: Self-pay | Admitting: Vascular Surgery

## 2022-09-11 ENCOUNTER — Encounter: Payer: Self-pay | Admitting: Obstetrics and Gynecology

## 2022-09-11 ENCOUNTER — Inpatient Hospital Stay: Payer: Medicare Other | Attending: Obstetrics and Gynecology | Admitting: Obstetrics and Gynecology

## 2022-09-11 VITALS — BP 152/77 | HR 73 | Temp 96.9°F | Resp 20 | Wt 137.8 lb

## 2022-09-11 DIAGNOSIS — M47812 Spondylosis without myelopathy or radiculopathy, cervical region: Secondary | ICD-10-CM | POA: Diagnosis not present

## 2022-09-11 DIAGNOSIS — I2510 Atherosclerotic heart disease of native coronary artery without angina pectoris: Secondary | ICD-10-CM | POA: Diagnosis not present

## 2022-09-11 DIAGNOSIS — R87623 High grade squamous intraepithelial lesion on cytologic smear of vagina (HGSIL): Secondary | ICD-10-CM

## 2022-09-11 DIAGNOSIS — N952 Postmenopausal atrophic vaginitis: Secondary | ICD-10-CM | POA: Diagnosis not present

## 2022-09-11 DIAGNOSIS — N893 Dysplasia of vagina, unspecified: Secondary | ICD-10-CM

## 2022-09-11 DIAGNOSIS — M62838 Other muscle spasm: Secondary | ICD-10-CM | POA: Insufficient documentation

## 2022-09-11 DIAGNOSIS — Z7902 Long term (current) use of antithrombotics/antiplatelets: Secondary | ICD-10-CM | POA: Insufficient documentation

## 2022-09-11 DIAGNOSIS — N890 Mild vaginal dysplasia: Secondary | ICD-10-CM | POA: Insufficient documentation

## 2022-09-11 DIAGNOSIS — Z79899 Other long term (current) drug therapy: Secondary | ICD-10-CM | POA: Insufficient documentation

## 2022-09-11 DIAGNOSIS — R87613 High grade squamous intraepithelial lesion on cytologic smear of cervix (HGSIL): Secondary | ICD-10-CM | POA: Diagnosis present

## 2022-09-11 DIAGNOSIS — E039 Hypothyroidism, unspecified: Secondary | ICD-10-CM | POA: Diagnosis not present

## 2022-09-11 DIAGNOSIS — E785 Hyperlipidemia, unspecified: Secondary | ICD-10-CM | POA: Diagnosis not present

## 2022-09-11 DIAGNOSIS — Z90722 Acquired absence of ovaries, bilateral: Secondary | ICD-10-CM | POA: Diagnosis not present

## 2022-09-11 DIAGNOSIS — Z7901 Long term (current) use of anticoagulants: Secondary | ICD-10-CM | POA: Insufficient documentation

## 2022-09-11 NOTE — Progress Notes (Signed)
GYN ONC Interval [facility]  Telephone:([phone] [phone]  Patient Care Team: Mick Sell, MD as PCP - General (Infectious Diseases) Benita Gutter, RN as Oncology Nurse Navigator   Name of the patient: Melanie Browning  [id number]  1941-05-11   Date of visit: 09/11/2022  Gynecologic Oncology Interval Visit   Referring Provider: Dr. 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 She is s/p TAH-BSO on 09/05/2015, but final surgical path was negative for dysplasia.  After surgery, she was closely monitored every 6 months with colposcopies and PAPs.   05/08/2022 HGSIL vaginal pap; HRHPV positive. She has no complaints.  She has been using the estrogen vaginal therapy twice a week since recommended in August.  She did recently returned from [place].  She did not use estrogen therapy during that trip.  She has reported very rare vaginal spotting. She presents today for follow up exam, PAP, and colposcopy.   Gynecologic Oncology History Melanie Browning is a pleasant G1P1 female who is seen in consultation from Dr. Sampson Goon for dysplasia. She is s/p TAH-BSO on 09/05/2015, but final surgical path was negative for dysplasia.  After surgery, she was closely monitored every 6 months with colposcopies and PAPs. Prior treatment in [place] for cervical and vaginal dysplasia from 2016 to 08/2019.   9/16 LSIL PAP, HR HPV+, Colposcopic biopsies 10/16, CIN1 on ectocervix, ECC negative. 11/16 [facility] biopsy LSIL, clear margins. ECC negative.  09/05/2015 TLH/BSO Dr Rowan Blase in [facility] for abnormal PAP. No residual disease in specimen. PAP 08/2016 LGSIL  PAP 02/09/2017 HSIL Colposcopy 02/2017 VAIN 1 PAP 05/2017 NILM 10/30/2017 ASCUS, HPV 16+ Colposcopy 11/26/2017 VAIN 1 Colposcopy 03/17/2018 No obvious disease noted (as per note from Dr.  Pernell Dupre on 09/28/2019)  PAP 03/25/2019 LSIL/HPV+ Colposcopy with biopsy 04/20/2019 VAIN 1  PAP 09/21/2019, patient reports VAIN 1, HPV+ Colposcopy 7/21 normal PAP 10/25/20 HSIL, +HR HPV PAP 10/25/20 HSIL, -HR HPV  PAP 10/25/20 HSIL, +HR HPV Reported no gyn symptoms.   Colposcopy 2/22 negative We discussed options for management including surveillance versus Efudex treatment.  She was anxious and preferred treatment.  We prescribed Efudex once a week for 10 weeks and then RTC in 1-2 months for repeat PAP. She completed this course with some interruptions due to vulvar irritation despite skin protectants.   PAP 02/28/21 HSIL, HR HPV- No bleeding or discharge, slight odor.  Repeat Pap 04/18/21- ASC-H; cannot exclude high grade squamous intraepithelial.   Recommended she use Estradiol cream to improve health of vaginal mucosa prior to repeat colposcopy.    06/06/2021 Biopsy to evaluate abnormal Pap  DIAGNOSIS:  A. VAGINA; BIOPSY:  - MINUTE FRAGMENT OF VAGINAL MUCOSA WITH PARTIALLY DETACHED EPITHELIUM  AND AT LEAST LOW-GRADE SQUAMOUS INTRAEPITHELIAL LESION (VAIN 1, MILD  DYSPLASIA).  - NEGATIVE FOR DEFINITE HIGH-GRADE SQUAMOUS INTRAEPITHELIAL LESION AND MALIGNANCY.   12/05/21 PAP HSIL/HPV+ Colposcopy 3/23 with lesion in left fornix, but path did not show significant lesion.   VAGINAL FORNIX, LEFT; BIOPSY:  - 1 FRAGMENT OF ATYPICAL SQUAMOUS MUCOSA ASSOCIATED WITH POSSIBLE  PREVIOUS BIOPSY SITE REACTION.  - SEPARATE FRAGMENT OF ATROPHIC VAGINAL MUCOSA.     Other pertinent medical issues She had stent placed a few days ago in her leg due to artery narrowing.  On Plavix and ASA.  Small blood blister below right groin where vascular access was obtained. Bruises  easily, but no bleeding.   She had colonoscopy for follow up of Lymphocytic colitis and that was stable.   Also having problems with intermittent claudication in left leg and underwent vascular percutaneous procedures including a  popliteal stent in 12/21.  She is taking Plavix and ASA.   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   Muscle spasms of neck 04/06/2022   Cervical spondylosis 04/06/2022   Acute pain of left wrist 02/14/2022   Atherosclerosis of native arteries of extremity with rest pain (HCC) 11/20/2021   CAD (coronary artery disease) 12/10/2020   Coronary artery disease involving native coronary artery of native heart 11/30/2020   S/P drug eluting coronary stent placement 11/15/2020   Abdominal pain, LLQ (left lower quadrant) 09/28/2020   Long term current use of anticoagulant therapy 09/28/2020   Functional diarrhea 09/28/2020   Chronic venous insufficiency 04/23/2020   PAD (peripheral artery disease) (HCC) 04/19/2020   Hyperlipidemia 04/19/2020   Hypothyroidism 04/19/2020   Popliteal artery injury, left, 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   Disruption of external operation (surgical) wound, not elsewhere classified, initial encounter 11/09/2019   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) 10/29/2019   Stricture, artery (HCC) 10/29/2019   Critical lower limb ischemia (HCC) 10/13/2019   Hypothyroidism due to acquired atrophy of thyroid 04/07/2019   Syncope and collapse 04/07/2019   Senile osteoporosis 09/20/2016   Sleep disorder 07/18/2016   IGT (impaired glucose tolerance) 06/26/2015   Pap smear abnormality of cervix with ASCUS favoring dysplasia 06/26/2015   Family history of Guillain-Barre syndrome 04/26/2015   History of herpes genitalis 04/26/2015   History of HPV infection 04/26/2015   Dyslipidemia 04/17/2015   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   PMR (polymyalgia rheumatica) (HCC) 04/17/2015   Arthritis of shoulder region, 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   Past Medical History: Past Medical History:  Diagnosis Date   Arthritis     Colitis    Dry [place]    [place]   HPV (human papilloma virus) infection    Hyperlipidemia    Ischemia of left lower extremity    Osteoporosis    Polymyalgia rheumatica (HCC)    Thyroid disease    Vaginal dysplasia    Past Surgical History: Past Surgical History:  Procedure Laterality Date   ABDOMINAL HYSTERECTOMY     complete   COLONOSCOPY WITH PROPOFOL N/A 10/04/2020   Procedure: COLONOSCOPY WITH PROPOFOL;  Surgeon: Toledo, Boykin Nearing, MD;  Location: [facility];  Service: Gastroenterology;  Laterality: N/A;   colposcobpy     COLPOSCOPY     CORONARY STENT INTERVENTION N/A 11/15/2020   Procedure: CORONARY STENT INTERVENTION;  Surgeon: Alwyn Pea, MD;  Location: [facility];  Service: Cardiovascular;  Laterality: N/A;   knee athroscopy Left    LAPAROSCOPIC HYSTERECTOMY     LEFT HEART CATH AND CORONARY ANGIOGRAPHY N/A 11/15/2020   Procedure: LEFT HEART CATH AND CORONARY ANGIOGRAPHY;  Surgeon: Alwyn Pea, MD;  Location: [facility];  Service: Cardiovascular;  Laterality: N/A;   LOWER EXTREMITY ANGIOGRAPHY Left 09/05/2020   Procedure: LOWER EXTREMITY ANGIOGRAPHY;  Surgeon: Renford Dills, MD;  Location: [facility];  Service: Cardiovascular;  Laterality: Left;   LOWER EXTREMITY ANGIOGRAPHY Left 04/10/2021   Procedure: LOWER EXTREMITY ANGIOGRAPHY;  Surgeon: Renford Dills, MD;  Location: [facility];  Service: Cardiovascular;  Laterality: Left;   LOWER EXTREMITY ANGIOGRAPHY Left 11/20/2021   Procedure: Lower Extremity Angiography;  Surgeon: Renford Dills, MD;  Location: [facility];  Service: Cardiovascular;  Laterality: Left;   LOWER EXTREMITY ANGIOGRAPHY Left 06/05/2022   Procedure: Lower Extremity Angiography;  Surgeon: Renford Dills, MD;  Location: [facility];  Service: Cardiovascular;  Laterality: Left;   LOWER EXTREMITY ANGIOGRAPHY Left 07/23/2022   Procedure: Lower  Extremity Angiography;  Surgeon: Renford Dills, MD;  Location: [facility];  Service: Cardiovascular;  Laterality: Left;   Past Gynecologic History:  Menarche: 14 Menstrual details: Lasts 3 days Menses regular: yes Last Menstrual Period: Unknown History of OCP/HRT use: No History of Abnormal pap: yes,  as per HPI Last pap:  08/2019 History of STDs: The patient reports a past history of: herpes. Sexually active: not asked  OB History:  OB History  No obstetric history on file.   Family History: Family History  Problem Relation Age of Onset   Cancer Mother    Pancreatic cancer Mother    Heart disease Father    Social History: Social History   Socioeconomic History   Marital status: Married    Spouse name: Norm    Number of children: 1   Years of education: Not on file   Highest education level: Not on file  Occupational History   Occupation: retired   Tobacco Use   Smoking status: Never   Smokeless tobacco: Never  Vaping Use   Vaping Use: Never used  Substance and Sexual Activity   Alcohol use: Yes    Alcohol/week: 3.0 standard drinks of alcohol    Types: 3 Glasses of wine per week    Comment: 3 glasses of wine per week    Drug use: Never   Sexual activity: Yes  Other Topics Concern   Not on file  Social History Narrative   L[facility]    Social Determinants of Health   Financial Resource Strain: Not on file  Food Insecurity: Not on file  Transportation Needs: Not on file  Physical Activity: Not on file  Stress: Not on file  Social Connections: Not on file  Intimate Partner Violence: Not on file    Allergies: Allergies  Allergen Reactions   Lactose Other (See Comments)    Gi upset and diarrhea  Other reaction(s): Other (See Comments) Gi upset and diarrhea Gi upset and diarrhea Gi upset and diarrhea    Current Medications: Current Outpatient Medications  Medication Sig Dispense Refill   aspirin EC 81 MG  tablet Take 81 mg by mouth every evening. Swallow whole.     atorvastatin (LIPITOR) 40 MG tablet Take 40 mg by mouth every evening.      budesonide (ENTOCORT EC) 3 MG 24 hr capsule Take 9 mg by mouth daily.     citalopram (CELEXA) 10 MG tablet Take 10 mg by mouth daily.     conjugated estrogens (PREMARIN) vaginal cream Insert 1/4 applicator full in to the vagina at night twice weekly. 30 g 2   diphenoxylate-atropine (LOMOTIL) 2.5-0.025 MG tablet TAKE 1 TABLET BY MOUTH 3 TIMES DAILY AS NEEDED FOR DIARRHEA. 90 tablet 0   levothyroxine (SYNTHROID) 75 MCG tablet Take 75 mcg by mouth daily before breakfast.      losartan (COZAAR) 25 MG tablet Take 25 mg by mouth daily.     nitroGLYCERIN (NITROSTAT) 0.4 MG SL tablet Place under the tongue.  ticagrelor (BRILINTA) 90 MG TABS tablet Take 1 tablet (90 mg total) by mouth 2 (two) times daily. 180 tablet 0   amitriptyline (ELAVIL) 10 MG tablet TAKE 1 TABLET BY MOUTH NIGHTLY (Patient not taking: Reported on 07/23/2022) 90 tablet 0   clopidogrel (PLAVIX) 75 MG tablet TAKE 1 TABLET BY MOUTH DAILY (Patient not taking: Reported on 06/07/2022) 30 tablet 5   No current facility-administered medications for this visit.    Review of Systems General: no complaints  HEENT: no complaints  Lungs: no complaints  Cardiac: no complaints  GI: no complaints  GU: no complaints  Musculoskeletal: no complaints  Extremities: no complaints  Skin: no complaints  Neuro: no complaints  Endocrine: no complaints  Psych: no complaints        Objective:  Physical Examination:  BP (!) 152/77   Pulse 73   Temp (!) 96.9 F (36.1 C)   Resp 20   Wt 137 lb 12.8 oz (62.5 kg)   SpO2 100%   BMI 20.95 kg/m    ECOG Performance Status: 0 - Asymptomatic  GENERAL: Patient is a well appearing female in no acute distress HEENT: Atraumatic normocephalic NODES:  No inguinal lymphadenopathy palpated.  ABDOMEN:  Soft, nontender, nondistended.   EXTREMITIES:  No peripheral  edema.   NEURO:  Nonfocal. Well oriented.  Appropriate affect.  Pelvic: Noted by NP EGBUS: no lesions Cervix: Surgically absent  vagina: Atrophic.  Pap smear was obtained.  During this process there were 2 friable lesions noted.  1 on the right lateral aspect of the vaginal cuff.  Another was on the left lateral vaginal sidewall approximately 1.5 cm from the top of the cuff.  No other gross lesions were noted.  There was no discharge or active bleeding. Uterus: Surgically absent BME: no palpable masses   Colposcopy: PROCEDURE: The risks and benefits of the procedure were reviewed and informed consent obtained. Time out was performed. The patient received pre-procedure teaching and expressed understanding. The post-procedure instructions were reviewed with the patient and she expressed understanding. The patient does not have any barriers to learning.  Colposcopy of the upper vagina was performed after application os acetic acid. The findings revealed 2 areas of abnormal vasculature located as noted on the upper pelvic exam.  Biopsies were attempted however were difficult due to the location.  We were able to and a small biopsy of the left lateral lesion. The procedure completed without complications. Hemostasis adequate with AgNO3. The patient tolerated the procedure well.   Post-procedure evaluation the patient was stable without complaints.     Assessment:  Melanie Browning is a 81 y.o. female s/p TAH-BSO 09/05/2015 for dysplasia but no dysplasia in specimen.  Low grade PAP abnormalities since then with colposcopy negative.  PAP 12/20 VAIN1 HPV+.  Moved to this area and first seen 7/21 with normal colposcopy.  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  Colposcopy of vagina 2/22 with no lesions seen.   No symptoms. We discussed options for management including surveillance versus Efudex treatment.  She is anxious.and preferred treatment.  She used  Efudex once a week for 10 weeks with some interruption for vulvar  irritation. PAP 02/28/21 HSIL, HR HPV-. Confirmed on [facility] review.  04/18/21- ASC-H; cannot exclude high grade squamous intraepithelial. On colposcopy 06/06/2021 with Lugol's small area of non staining biopsied, VAIN1. PAP 3/23 showed HSIL/HPV+.  On colposcopy an area with punctation seen in the left fornix.  Biopsy left fornix 12/26/21 atypia only.  05/08/2022 HGSIL vaginal pap; HRHPV  positive.  Colposcopy consistent with VAIN 2, biopsy attempted  Vaginal atrophy.  Recent arterial stent with vascular for arterial narrowing in right popliteal area. Patient on Plavix and ASA.  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  Continue vaginal estrogen in view of atrophy and abnormal PAPs, which did aid visualization.  Will determine next steps based on her biopsy results.  Even if the biopsies are negative it may be worthwhile to proceed with operative evaluation and exam under anesthesia with biopsies in the operating room.  As per prior note from Dr. Johnnette Litter -  if surgery for vaginectomy recommended may ask Dr Doy Hutching to do this, as UroGyn much more comfortable with this dissection and avoidance of the bladder.  Plan for return in 1 week for telephone or televideo visit to discuss the results and management recommendations  The patient's diagnosis, an outline of the further diagnostic and laboratory studies which will be required, the recommendation, and alternatives were discussed.  All questions were answered to the patient's satisfaction.   Mizael Sagar Leta Jungling, MD

## 2022-09-11 NOTE — Addendum Note (Signed)
Addended by: Alinda Dooms on: 09/11/2022 01:16 PM   Modules accepted: Orders

## 2022-09-16 LAB — SURGICAL PATHOLOGY

## 2022-09-18 ENCOUNTER — Inpatient Hospital Stay (HOSPITAL_BASED_OUTPATIENT_CLINIC_OR_DEPARTMENT_OTHER): Payer: Medicare Other | Admitting: Obstetrics and Gynecology

## 2022-09-18 DIAGNOSIS — N893 Dysplasia of vagina, unspecified: Secondary | ICD-10-CM

## 2022-09-18 DIAGNOSIS — R87623 High grade squamous intraepithelial lesion on cytologic smear of vagina (HGSIL): Secondary | ICD-10-CM

## 2022-09-18 NOTE — Progress Notes (Signed)
I contacted Melanie Browning to review her pathology results as noted below.   DIAGNOSIS:  A. VAGINAL TISSUE; BIOPSY:  - LOW-GRADE SQUAMOUS INTRAEPITHELIAL LESION (VAIN 1, MILD DYSPLASIA).  - NEGATIVE FOR HIGH GRADE SQUAMOUS INTRAEPITHELIAL LESION AND  MALIGNANCY.   I offered 2 approaches with either surgery versus continued observation with repeat Pap and exam in 6 months.  After discussing with Dr. Johnnette Litter who favors a more conservative approach the patient has opted to proceed with observation and repeat exam with Pap in 6 months.  I will ask our team to make these appointments.  Lucindia Lemley Leta Jungling, MD

## 2022-09-19 LAB — IGP, APTIMA HPV: HPV Aptima: POSITIVE — AB

## 2022-09-30 NOTE — Progress Notes (Unsigned)
Cardiology Office Note  Date:  10/01/2022   ID:  Melanie Browning, DOB [birth year], MRN [id number]  PCP:  Mick Sell, MD   Chief Complaint  Patient presents with   New Patient (Initial Visit)    Establish care for CAD; s/p stent placed to the LAD 2022 by Dr. Juliann Pares. Medications reviewed by the patient verbally. "Doing well."     HPI:  Melanie Browning is a 82 y.o.female patient with past medical history of peripheral vascular disease  previous critical limb ischemia requiring intervention  History of stent thrombosis lower extremities Bovine patch on right LE significant coronary disease including PCI and stent to LAD with DES  HTN Who presents for new patient evaluation of her shortness of breath  Prior cardiac hx reviewed Cardiac CTA ordered 2/22 Severe proximal LAD disease noted Was not having sx at the time  Cardiac catheterization November 15, 2020 Stent placed to proximal LAD  Pharmacologic Myoview April 2023, low risk study, normal perfusion Ejection fraction 64%  maintained on Brilinta aspirin , failed plavix statin therapy  on losartan for blood pressure management   recent worsening shortness of breath dyspnea on exertion   Labs reviewed Total chol 213, LDL 69  Echo 4/23 NORMAL LEFT VENTRICULAR SYSTOLIC FUNCTION WITH AN ESTIMATED EF = >55 %  NORMAL RIGHT VENTRICULAR SYSTOLIC FUNCTION  MILD TRICUSPID AND MITRAL VALVE INSUFFICIENCY  TRACE AORTIC VALVE INSUFFICIENCY  MILD AORTIC VALVE STENOSIS   EKG personally reviewed by myself on todays visit NSR rate 69 bpm no significant ST-T wave changes  PMH:   has a past medical history of Arthritis, Colitis, Dry eye, HPV (human papilloma virus) infection, Hyperlipidemia, Ischemia of left lower extremity, Osteoporosis, Polymyalgia rheumatica (HCC), Thyroid disease, and Vaginal dysplasia.  PSH:    Past Surgical History:  Procedure Laterality Date   ABDOMINAL HYSTERECTOMY     complete   COLONOSCOPY WITH  PROPOFOL N/A 10/04/2020   Procedure: COLONOSCOPY WITH PROPOFOL;  Surgeon: Toledo, Boykin Nearing, MD;  Location: [facility];  Service: Gastroenterology;  Laterality: N/A;   colposcobpy     COLPOSCOPY     CORONARY STENT INTERVENTION N/A 11/15/2020   Procedure: CORONARY STENT INTERVENTION;  Surgeon: Alwyn Pea, MD;  Location: [facility];  Service: Cardiovascular;  Laterality: N/A;   knee athroscopy Left    LAPAROSCOPIC HYSTERECTOMY     LEFT HEART CATH AND CORONARY ANGIOGRAPHY N/A 11/15/2020   Procedure: LEFT HEART CATH AND CORONARY ANGIOGRAPHY;  Surgeon: Alwyn Pea, MD;  Location: [facility];  Service: Cardiovascular;  Laterality: N/A;   LOWER EXTREMITY ANGIOGRAPHY Left 09/05/2020   Procedure: LOWER EXTREMITY ANGIOGRAPHY;  Surgeon: Renford Dills, MD;  Location: [facility];  Service: Cardiovascular;  Laterality: Left;   LOWER EXTREMITY ANGIOGRAPHY Left 04/10/2021   Procedure: LOWER EXTREMITY ANGIOGRAPHY;  Surgeon: Renford Dills, MD;  Location: [facility];  Service: Cardiovascular;  Laterality: Left;   LOWER EXTREMITY ANGIOGRAPHY Left 11/20/2021   Procedure: Lower Extremity Angiography;  Surgeon: Renford Dills, MD;  Location: [facility];  Service: Cardiovascular;  Laterality: Left;   LOWER EXTREMITY ANGIOGRAPHY Left 06/05/2022   Procedure: Lower Extremity Angiography;  Surgeon: Renford Dills, MD;  Location: [facility];  Service: Cardiovascular;  Laterality: Left;   LOWER EXTREMITY ANGIOGRAPHY Left 07/23/2022   Procedure: Lower Extremity Angiography;  Surgeon: Renford Dills, MD;  Location: [facility];  Service: Cardiovascular;  Laterality: Left;    Current  Outpatient Medications  Medication Sig Dispense Refill   aspirin EC 81 MG tablet Take 81 mg by mouth every evening. Swallow whole.     atorvastatin (LIPITOR) 40 MG tablet Take 40 mg by mouth every evening.      budesonide (ENTOCORT EC) 3 MG 24  hr capsule Take 9 mg by mouth daily.     citalopram (CELEXA) 10 MG tablet Take 10 mg by mouth daily.     conjugated estrogens (PREMARIN) vaginal cream Insert 1/4 applicator full in to the vagina at night twice weekly. 30 g 2   diphenoxylate-atropine (LOMOTIL) 2.5-0.025 MG tablet TAKE 1 TABLET BY MOUTH 3 TIMES DAILY AS NEEDED FOR DIARRHEA. 90 tablet 0   levothyroxine (SYNTHROID) 75 MCG tablet Take 75 mcg by mouth daily before breakfast.      losartan (COZAAR) 25 MG tablet Take 25 mg by mouth daily.     nitroGLYCERIN (NITROSTAT) 0.4 MG SL tablet Place under the tongue.     ticagrelor (BRILINTA) 90 MG TABS tablet Take 1 tablet (90 mg total) by mouth 2 (two) times daily. 180 tablet 0   amitriptyline (ELAVIL) 10 MG tablet TAKE 1 TABLET BY MOUTH NIGHTLY (Patient not taking: Reported on 07/23/2022) 90 tablet 0   No current facility-administered medications for this visit.     Allergies:   Lactose   Social History:  The patient  reports that she has never smoked. She has never used smokeless tobacco. She reports current alcohol use of about 3.0 standard drinks of alcohol per week. She reports that she does not use drugs.   Family History:   family history includes Cancer in her mother; Heart attack (age of onset: 34) in her father; Heart disease in her father; Pancreatic cancer in her mother.    Review of Systems: Review of Systems  Constitutional: Negative.   HENT: Negative.    Respiratory: Negative.    Cardiovascular: Negative.   Gastrointestinal: Negative.   Musculoskeletal: Negative.   Neurological: Negative.   Psychiatric/Behavioral: Negative.    All other systems reviewed and are negative.  PHYSICAL EXAM: VS:  BP 138/60 (BP Location: Right Arm, Patient Position: Sitting, Cuff Size: Normal)   Pulse 69   Ht 5\' 8"  (1.727 m)   Wt 142 lb 2 oz (64.5 kg)   SpO2 98%   BMI 21.61 kg/m  , BMI Body mass index is 21.61 kg/m. GEN: Well nourished, well developed, in no acute distress HEENT:  normal Neck: no JVD, carotid bruits, or masses Cardiac: RRR; no murmurs, rubs, or gallops,no edema  Respiratory:  clear to auscultation bilaterally, normal work of breathing GI: soft, nontender, nondistended, + BS MS: no deformity or atrophy Skin: warm and dry, no rash Neuro:  Strength and sensation are intact Psych: euthymic mood, full affect  Recent Labs: 07/23/2022: BUN 19; Creatinine, Ser 0.69    Lipid Panel No results found for: "CHOL", "HDL", "LDLCALC", "TRIG"    Wt Readings from Last 3 Encounters:  10/01/22 142 lb 2 oz (64.5 kg)  09/11/22 137 lb 12.8 oz (62.5 kg)  09/05/22 141 lb (64 kg)     ASSESSMENT AND PLAN:  Problem List Items Addressed This Visit       Cardiology Problems   PAD (peripheral artery disease) (HCC) - Primary   Relevant Medications   ezetimibe (ZETIA) 10 MG tablet   Other Relevant Orders   EKG 12-Lead   CAD (coronary artery disease)   Relevant Medications   ezetimibe (ZETIA) 10 MG tablet  Other Relevant Orders   EKG 12-Lead   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)   Relevant Medications   ezetimibe (ZETIA) 10 MG tablet   Other Relevant Orders   EKG 12-Lead   Hyperlipidemia   Relevant Medications   ezetimibe (ZETIA) 10 MG tablet   Other Relevant Orders   EKG 12-Lead   Coronary artery disease with stable angina Prior stent to proximal LAD, Currently with no symptoms of angina. No further workup at this time.  Recommended adding Zetia to Lipitor to achieve goal LDL less than 70 On Brilinta for PAD  PAD Lower extremity claudication Prior stenting, surgery Followed by vascular surgery On aspirin, Brilinta, Lipitor Will add Zetia as above We did discuss PCSK9 inhibitor if unable to achieve goal LDL less than 70 Recommend she discuss with vascular whether she could change Brilinta to Effient given the cost of Brilinta   Total encounter time more than 60 minutes  Greater than  50% was spent in counseling and coordination of care with the patient    Signed, Dossie Arbour, M.D., Ph.D. [facility] [place], [place] [phone]

## 2022-10-01 ENCOUNTER — Encounter: Payer: Self-pay | Admitting: Cardiovascular Disease

## 2022-10-01 ENCOUNTER — Ambulatory Visit: Payer: Medicare Other | Attending: Cardiovascular Disease | Admitting: Cardiovascular Disease

## 2022-10-01 VITALS — BP 138/60 | HR 69 | Ht 68.0 in | Wt 142.1 lb

## 2022-10-01 DIAGNOSIS — E782 Mixed hyperlipidemia: Secondary | ICD-10-CM | POA: Insufficient documentation

## 2022-10-01 DIAGNOSIS — I25118 Atherosclerotic heart disease of native coronary artery with other forms of angina pectoris: Secondary | ICD-10-CM | POA: Insufficient documentation

## 2022-10-01 DIAGNOSIS — I739 Peripheral vascular disease, unspecified: Secondary | ICD-10-CM | POA: Diagnosis not present

## 2022-10-01 DIAGNOSIS — I70212 Atherosclerosis of native arteries of extremities with intermittent claudication, left leg: Secondary | ICD-10-CM | POA: Diagnosis not present

## 2022-10-01 MED ORDER — EZETIMIBE 10 MG PO TABS: 10.0000 mg | ORAL_TABLET | Freq: Every day | ORAL | 3 refills | Status: DC

## 2022-10-01 NOTE — Patient Instructions (Addendum)
Medication Instructions:  Please start zetia 10 mg daily  Ask Dr. Ronalee Belts about effient, instead of brilinta  If you need a refill on your cardiac medications before your next appointment, please call your pharmacy.   Lab work: No new labs needed  Testing/Procedures: No new testing needed  Follow-Up: At [facility], you and your health needs are our priority.  As part of our continuing mission to provide you with exceptional heart care, we have created designated Provider Care Teams.  These Care Teams include your primary Cardiologist (physician) and Advanced Practice Providers (APPs -  Physician Assistants and Nurse Practitioners) who all work together to provide you with the care you need, when you need it.  You will need a follow up appointment in 6 months  Providers on your designated Care Team:   Murray Hodgkins, NP Christell Faith, PA-C Cadence Kathlen Mody, [place]  COVID-19 Vaccine Information can be found at: [email or web address] For questions related to vaccine distribution or appointments, please email [email or web address] or call [phone].

## 2022-10-03 ENCOUNTER — Encounter (INDEPENDENT_AMBULATORY_CARE_PROVIDER_SITE_OTHER): Payer: Self-pay | Admitting: Vascular Surgery

## 2022-10-17 ENCOUNTER — Ambulatory Visit (INDEPENDENT_AMBULATORY_CARE_PROVIDER_SITE_OTHER): Payer: Medicare Other | Admitting: Vascular Surgery

## 2022-10-17 ENCOUNTER — Encounter (INDEPENDENT_AMBULATORY_CARE_PROVIDER_SITE_OTHER): Payer: Medicare Other

## 2022-10-18 ENCOUNTER — Telehealth: Payer: Self-pay | Admitting: *Deleted

## 2022-10-18 NOTE — Telephone Encounter (Signed)
Patient called and reports that her premarin is $400 , but 6 moths ago is was only $80, she is asking if Ander Purpura has a way to get it for her for the lower price. I called pharmacy, it does not need a PA per pharmacy, patient was told by pharmacy to call her insurance to see if it has to do with the fact that it is a new year and she needs to meet a deductible . Please advise

## 2022-10-21 ENCOUNTER — Telehealth: Payer: Self-pay | Admitting: *Deleted

## 2022-10-21 ENCOUNTER — Other Ambulatory Visit: Payer: Self-pay | Admitting: Nurse Practitioner

## 2022-10-21 MED ORDER — ESTRADIOL 0.1 MG/GM VA CREA: TOPICAL_CREAM | VAGINAL | 2 refills | Status: DC

## 2022-10-21 NOTE — Telephone Encounter (Signed)
Patient called and reports that she is in the first of the year deduction period and that she does not have $400 to pay for the Premarin cream. She is willing to try an alternative cheaper prescription as it would be much later in the year before she can get the premarin and she is out currently. Please advise

## 2022-10-21 NOTE — Progress Notes (Signed)
Premarin not covered by insurance. Prescription for estradiol sent to pharmacy.

## 2022-10-21 NOTE — Telephone Encounter (Signed)
Call returned to patient and informed of new prescription sent to pharmacy and to let us know if she has any problems getting it filled. Patient states she will pick up prescription tomorrow

## 2022-10-22 ENCOUNTER — Encounter: Payer: Self-pay | Admitting: Gastroenterology

## 2022-10-22 ENCOUNTER — Ambulatory Visit (INDEPENDENT_AMBULATORY_CARE_PROVIDER_SITE_OTHER): Payer: Medicare Other | Admitting: Gastroenterology

## 2022-10-22 VITALS — BP 135/74 | HR 86 | Temp 98.2°F | Ht 68.0 in | Wt 144.1 lb

## 2022-10-22 DIAGNOSIS — R197 Diarrhea, unspecified: Secondary | ICD-10-CM

## 2022-10-22 NOTE — Progress Notes (Signed)
Melanie Darby, MD [address]  [place]  [place], [place] [address]  Main: [phone]  Fax: [phone]    Gastroenterology Consultation  Referring Provider:     Leonel Ramsay, MD Primary Care Physician:  Leonel Ramsay, MD Primary Gastroenterologist:  Dr. Sherri Sear Reason for Consultation: History of collagenous colitis, flareup of diarrhea        HPI:   Melanie Browning is a 82 y.o. female referred by Dr. Ola Spurr Cheral Marker, MD  for consultation & management of collagenous colitis.  Patient is here for second opinion regarding management of chronic diarrhea.  Patient reports that she has been diagnosed with lymphocytic colitis about 15 years ago and was treated with extended release budesonide along with Lomotil which brought her diarrhea into remission for several years.  She has been taking Lomotil as needed which helps with her diarrhea to some extent.  She has noticed flareup since October 2021.  Was originally evaluated by Dr. Alice Reichert, underwent colonoscopy with random colon biopsies, pathology confirmed collagenous colitis.  Patient was restarted on budesonide 3 mg 3 pills daily which she was taking until May 15.  She noticed modest improvement in her diarrhea.  She is currently experiencing 2-3 soft bowel movements daily on Lomotil 1-2 times daily.  She stopped budesonide as she is concerned about diagnosis of senile osteoporosis and waiting to be seen by endocrinology.  Patient was previously on Prolia injections.  She also tried [employer] as well as Colestid which did not seem to help  Patient has history of peripheral artery disease as well as coronary artery disease, recent PCI with stent placement on DAPT within last 1 year.  She finds her personal life to be stressful, she is the caregiver for her husband.  She did notice resolution of diarrhea when she went on a cruise with her girlfriend.  She does not sleep well. Patient takes melatonin, She denies any  weight loss.  She does not smoke or drink alcohol.  She does report lactose intolerance and tries to avoid daily.  She does report abdominal bloating as well as cramps, postprandial urgency.  She denies any rectal bleeding.  She is noticed to have macrocytosis without anemia.  TSH is normal, vitamin D normal.   Follow-up visit 10/22/2022 Patient made a follow-up appointment because of flareup of diarrhea.  She went to visit [place] in November, developed traveler's diarrhea, took Z-Pak prescribed by her PCP.  Her diarrhea did not improve, therefore took ciprofloxacin.  After she returned from the trip, her diarrhea persisted.  She started taking budesonide 3 capsules daily and her stools became formed in 2 days, therefore tapered the dose and eventually stopped.  Currently, she is still experiencing intermittent severe diarrheal episodes.  She restarted taking budesonide about 2 days ago and her bowel movements are slowly improving, however has postprandial diarrhea.  She had C. difficile in the past  NSAIDs: None  Antiplts/Anticoagulants/Anti thrombotics: Aspirin and Plavix for peripheral artery disease and coronary disease  GI Procedures: Colonoscopy 10/04/2020 - Internal hemorrhoids (Grade I) found on perianal exam. - Non-bleeding internal hemorrhoids. - Normal mucosa in the entire examined colon. Biopsied. DIAGNOSIS:  A. COLON, RANDOM; COLD BIOPSY:  - COLLAGENOUS COLITIS.  - NEGATIVE FOR DYSPLASIA AND MALIGNANCY.   Past Medical History:  Diagnosis Date   Arthritis    Colitis    Dry eye    bilateral when living in [place]   HPV (human papilloma virus) infection  Hyperlipidemia    Ischemia of left lower extremity    Osteoporosis    Polymyalgia rheumatica (HCC)    Thyroid disease    Vaginal dysplasia     Past Surgical History:  Procedure Laterality Date   ABDOMINAL HYSTERECTOMY     complete   COLONOSCOPY WITH PROPOFOL N/A 10/04/2020   Procedure: COLONOSCOPY WITH PROPOFOL;   Surgeon: Toledo, Boykin Nearing, MD;  Location: [facility] ENDOSCOPY;  Service: Gastroenterology;  Laterality: N/A;   colposcobpy     COLPOSCOPY     CORONARY STENT INTERVENTION N/A 11/15/2020   Procedure: CORONARY STENT INTERVENTION;  Surgeon: Alwyn Pea, MD;  Location: [facility];  Service: Cardiovascular;  Laterality: N/A;   knee athroscopy Left    LAPAROSCOPIC HYSTERECTOMY     LEFT HEART CATH AND CORONARY ANGIOGRAPHY N/A 11/15/2020   Procedure: LEFT HEART CATH AND CORONARY ANGIOGRAPHY;  Surgeon: Alwyn Pea, MD;  Location: [facility];  Service: Cardiovascular;  Laterality: N/A;   LOWER EXTREMITY ANGIOGRAPHY Left 09/05/2020   Procedure: LOWER EXTREMITY ANGIOGRAPHY;  Surgeon: Renford Dills, MD;  Location: [facility];  Service: Cardiovascular;  Laterality: Left;   LOWER EXTREMITY ANGIOGRAPHY Left 04/10/2021   Procedure: LOWER EXTREMITY ANGIOGRAPHY;  Surgeon: Renford Dills, MD;  Location: [facility];  Service: Cardiovascular;  Laterality: Left;   LOWER EXTREMITY ANGIOGRAPHY Left 11/20/2021   Procedure: Lower Extremity Angiography;  Surgeon: Renford Dills, MD;  Location: [facility];  Service: Cardiovascular;  Laterality: Left;   LOWER EXTREMITY ANGIOGRAPHY Left 06/05/2022   Procedure: Lower Extremity Angiography;  Surgeon: Renford Dills, MD;  Location: [facility];  Service: Cardiovascular;  Laterality: Left;   LOWER EXTREMITY ANGIOGRAPHY Left 07/23/2022   Procedure: Lower Extremity Angiography;  Surgeon: Renford Dills, MD;  Location: [facility];  Service: Cardiovascular;  Laterality: Left;    Current Outpatient Medications:    aspirin EC 81 MG tablet, Take 81 mg by mouth every evening. Swallow whole., Disp: , Rfl:    atorvastatin (LIPITOR) 40 MG tablet, Take 40 mg by mouth every evening. , Disp: , Rfl:    budesonide (ENTOCORT EC) 3 MG 24 hr capsule, Take 9 mg by mouth daily., Disp: , Rfl:    citalopram  (CELEXA) 10 MG tablet, Take 10 mg by mouth daily., Disp: , Rfl:    diphenoxylate-atropine (LOMOTIL) 2.5-0.025 MG tablet, TAKE 1 TABLET BY MOUTH 3 TIMES DAILY AS NEEDED FOR DIARRHEA., Disp: 90 tablet, Rfl: 0   estradiol (ESTRACE) 0.1 MG/GM vaginal cream, Insert 1/4 applicator full into vagina at night twice a week, Disp: 42.5 g, Rfl: 2   ezetimibe (ZETIA) 10 MG tablet, Take 1 tablet (10 mg total) by mouth daily., Disp: 90 tablet, Rfl: 3   levothyroxine (SYNTHROID) 75 MCG tablet, Take 75 mcg by mouth daily before breakfast. , Disp: , Rfl:    losartan (COZAAR) 25 MG tablet, Take 25 mg by mouth daily., Disp: , Rfl:    nitroGLYCERIN (NITROSTAT) 0.4 MG SL tablet, Place under the tongue., Disp: , Rfl:    ticagrelor (BRILINTA) 90 MG TABS tablet, Take 1 tablet (90 mg total) by mouth 2 (two) times daily., Disp: 180 tablet, Rfl: 0   Family History  Problem Relation Age of Onset   Cancer Mother    Pancreatic cancer Mother    Heart attack Father 60   Heart disease Father      Social History   Tobacco Use  Smoking status: Never   Smokeless tobacco: Never  Vaping Use   Vaping Use: Never used  Substance Use Topics   Alcohol use: Yes    Alcohol/week: 3.0 standard drinks of alcohol    Types: 3 Glasses of wine per week    Comment: 3 glasses of wine per week    Drug use: Never    Allergies as of 10/22/2022 - Review Complete 10/22/2022  Allergen Reaction Noted   Lactose Other (See Comments) 08/30/2015    Review of Systems:    All systems reviewed and negative except where noted in HPI.   Physical Exam:  BP 135/74 (BP Location: Right Arm, Patient Position: Sitting, Cuff Size: Normal)   Pulse 86   Temp 98.2 F (36.8 C) (Oral)   Ht 5\' 8"  (1.727 m)   Wt 144 lb 2 oz (65.4 kg)   BMI 21.91 kg/m  No LMP recorded. Patient is postmenopausal.  General:   Alert,  Well-developed, well-nourished, pleasant and cooperative in NAD Head:  Normocephalic and atraumatic. Eyes:  Sclera clear, no icterus.    Conjunctiva pink. Ears:  Normal auditory acuity. Nose:  No deformity, discharge, or lesions. Mouth:  No deformity or lesions,oropharynx pink & moist. Neck:  Supple; no masses or thyromegaly. Lungs:  Respirations even and unlabored.  Clear throughout to auscultation.   No wheezes, crackles, or rhonchi. No acute distress. Heart:  Regular rate and rhythm; no murmurs, clicks, rubs, or gallops. Abdomen:  Normal bowel sounds. Soft, non-tender and mildly distended, tympanic to percussion without masses, hepatosplenomegaly or hernias noted.  No guarding or rebound tenderness.   Rectal: Not performed Msk:  Symmetrical without gross deformities. Good, equal movement & strength bilaterally. Pulses:  Normal pulses noted. Extremities:  No clubbing or edema.  No cyanosis. Neurologic:  Alert and oriented x3;  grossly normal neurologically. Skin:  Intact without significant lesions or rashes. No jaundice. Psych:  Alert and cooperative. Normal mood and affect.  Imaging Studies: No abdominal imaging  Assessment and Plan:   Janeann Paisley is a 82 y.o. pleasant Caucasian female with history of coronary disease, peripheral artery disease s/p PCI with stent placement on DAPT, otherwise healthy, functionally independent, previous diagnosis of lymphocytic colitis, recent colonoscopy confirmed collagenous colitis is seen in consultation for recent flareup of diarrhea during recent travel to [place] of diarrhea Patient took Z-Pak for possible traveler's diarrhea followed by ciprofloxacin Diarrhea is persistent, therefore recommend GI profile PCR to rule out any infectious etiology, particularly C. difficile given recent Cipro use Advised patient to continue budesonide 3 mg 3 pills daily at least for 4 weeks before she does the taper and she may need at least 1 pill daily long-term in order to keep her diarrhea in remission  Follow up in 3-4 months   Melanie Darby, MD

## 2022-10-26 LAB — GI PROFILE, STOOL, PCR
Adenovirus F 40/41: NOT DETECTED
Astrovirus: NOT DETECTED
C difficile toxin A/B: DETECTED — AB
Campylobacter: DETECTED — AB
Cryptosporidium: NOT DETECTED
Cyclospora cayetanensis: NOT DETECTED
Entamoeba histolytica: NOT DETECTED
Enteroaggregative E coli: NOT DETECTED
Enteropathogenic E coli: NOT DETECTED
Enterotoxigenic E coli: NOT DETECTED
Giardia lamblia: NOT DETECTED
Norovirus GI/GII: DETECTED — AB
Plesiomonas shigelloides: NOT DETECTED
Rotavirus A: NOT DETECTED
Salmonella: NOT DETECTED
Sapovirus: NOT DETECTED
Shiga-toxin-producing E coli: NOT DETECTED
Shigella/Enteroinvasive E coli: NOT DETECTED
Vibrio cholerae: NOT DETECTED
Vibrio: NOT DETECTED
Yersinia enterocolitica: NOT DETECTED

## 2022-10-27 ENCOUNTER — Encounter: Payer: Self-pay | Admitting: Gastroenterology

## 2022-10-27 DIAGNOSIS — R197 Diarrhea, unspecified: Secondary | ICD-10-CM

## 2022-10-28 ENCOUNTER — Telehealth: Payer: Self-pay

## 2022-10-28 MED ORDER — AZITHROMYCIN 500 MG PO TABS: 500.0000 mg | ORAL_TABLET | Freq: Every day | ORAL | 0 refills | Status: AC

## 2022-10-28 MED ORDER — VANCOMYCIN HCL 125 MG PO CAPS: 125.0000 mg | ORAL_CAPSULE | Freq: Four times a day (QID) | ORAL | 0 refills | Status: AC

## 2022-10-28 NOTE — Telephone Encounter (Signed)
Patient verbalized understanding  

## 2022-10-28 NOTE — Telephone Encounter (Signed)
Sent medication to the pharmacy. Patient states the diarrhea this week has been awful. She states she will go pick up the medications today at the pharmacy. Patient asked if she should keep taking the Budesonide for the colitis with the antibiotics

## 2022-10-28 NOTE — Telephone Encounter (Signed)
-----  Message from Lin Landsman, MD sent at 10/28/2022  9:12 AM EST ----- Please inform patient that the stool studies came back positive for C. difficile infection as he was worried about given her history of Cipro intake.  Recommend oral vancomycin 125 mg 4 times daily for 10 days The stool studies also came back positive for Campylobacter, recommend azithromycin 500 mg once a day for 3 days She also had norovirus which is a viral infection and it is self-limited, does not require any treatment  She should let us know if her diarrhea does not improve after treatment of these infections  Rohini Vanga

## 2022-10-30 ENCOUNTER — Telehealth: Payer: Self-pay | Admitting: Gastroenterology

## 2022-10-30 NOTE — Telephone Encounter (Signed)
Patient states she Had diarrhea 6 times last night. She states last night she began to have dry heaving and nausea. She states she has had diarrhea 2 to 3 times this morning when she got up. She took the vancomycin this morning with a piece of dry toast. She is taking the Azithromycin at night. She states that she test positive for COVID and she states she stays at [facility] and she went to a dinner on Thursday night. The person that set across from her test positive for COVID on Monday. She states she knows that is where she got it from. She states before her husband went in the rehab he had the flu and her doctor gave her preventive tamiflu. She states she did not take it at that time but started taking it yesterday since she tested positive for covid. Informed patient that she should not take Tamiflu unless she was exposed to the flu or has the flu. She states it is a antiviral and it would help with Covid. Informed her the main side effect for Tamiflu is Diarrhea, nausea and vomiting. Informed patient I would ask if she should be taken the medication. She states she has a constant urgency to go to the bathroom. She states her stool is still watery but has changed color. It was yellow and orange color and now it brown color that looks like the color of her stool.

## 2022-10-30 NOTE — Telephone Encounter (Signed)
Pt called wanted Dr.Vanga to know that she has tested positive for covid and would like a call back to discuss other medical concerns  [phone]

## 2022-10-31 NOTE — Telephone Encounter (Signed)
Patient verbalized understanding of instructions

## 2022-10-31 NOTE — Telephone Encounter (Signed)
I recommend her not to take Tamiflu to treat COVID.  She should continue the antibiotics prescribed for infectious diarrhea.  She can take Imodium or Pepto-Bismol as needed for diarrhea  RV

## 2022-11-11 ENCOUNTER — Encounter: Payer: Self-pay | Admitting: Gastroenterology

## 2022-11-11 LAB — GI PROFILE, STOOL, PCR

## 2022-11-12 ENCOUNTER — Telehealth: Payer: Self-pay

## 2022-11-12 NOTE — Telephone Encounter (Signed)
-----   Message from Lin Landsman, MD sent at 11/12/2022 10:28 AM EST ----- Stool studies negative for infection, recommend to take budesonide 3 pills daily and Imodium as needed for diarrhea.  If her diarrhea does not improve or if she continues to lose weight, she should let us know in 2 to 3 weeks  Rohini Vanga

## 2022-11-12 NOTE — Telephone Encounter (Signed)
Patient verbalized understanding of results  

## 2022-11-17 ENCOUNTER — Encounter: Payer: Self-pay | Admitting: Gastroenterology

## 2022-11-17 ENCOUNTER — Encounter: Payer: Self-pay | Admitting: Cardiovascular Disease

## 2022-11-22 ENCOUNTER — Encounter: Payer: Self-pay | Admitting: Gastroenterology

## 2022-11-27 ENCOUNTER — Telehealth: Payer: Self-pay | Admitting: *Deleted

## 2022-11-27 ENCOUNTER — Other Ambulatory Visit (INDEPENDENT_AMBULATORY_CARE_PROVIDER_SITE_OTHER): Payer: Self-pay | Admitting: Vascular Surgery

## 2022-11-27 DIAGNOSIS — Z9889 Other specified postprocedural states: Secondary | ICD-10-CM

## 2022-11-27 DIAGNOSIS — I2510 Atherosclerotic heart disease of native coronary artery without angina pectoris: Secondary | ICD-10-CM

## 2022-11-27 NOTE — Telephone Encounter (Signed)
Patient called reporting that she is no longer using the Premarin cream and that she has a different product that she has not yet started using and would like to discuss with Lauren, She reports that she is having night sweats and saw her PCP yesterday and is wandering if it has to do with starting and stopping the Premarin cream. Please return her call

## 2022-11-29 NOTE — Telephone Encounter (Signed)
Call returned to patient and got voice mail. I left message per Melanie Medina, NP that she would not suspect Premarin t cause night sweats

## 2022-11-29 NOTE — Telephone Encounter (Signed)
Patient calling asking that you return her call as she is having night sweats and her GP asked her to call you about it to see if the Premarin could be the cause of it

## 2022-12-01 NOTE — Progress Notes (Unsigned)
MRN : [id number]  Melanie Browning is a 82 y.o. (Apr 16, 1941) female who presents with chief complaint of check circulation.  History of Present Illness:  The patient presents to [facility] for angiography of the left lower extremity.   The patient was last seen the office on 06/20/2022, for followup and review status post angiogram with intervention on 06/05/2022.    Procedure:  Rota Rex mechanical thrombectomy of the left SFA and popliteal 2.    Percutaneous transluminal angioplasty and stent placement left superficial femoral and popliteal arteries to 5 mm maximally 3.    Percutaneous transluminal angioplasty left posterior tibial to approximately 3.5 mm   Follow-up angiography dated July 23, 2022 showed widely patent reconstruction no evidence for recurrent hyperplastic stenosis.   The patient notes the initial improvement in the lower extremity symptoms has decreased. Interval shortening of the patient's claudication distance but no rest pain symptoms. No new ulcers or wounds have occurred since the last visit.  Her previous reconstructions have been short lived and she is going out of the country to [place] and will not have medical care available.   There have been no significant changes to the patient's overall health care.   No documented history of amaurosis fugax or recent TIA symptoms. There are no recent neurological changes noted. No documented history of DVT, PE or superficial thrombophlebitis. The patient denies recent episodes of angina or shortness of breath.    Previous duplex US of the left lower extremity arterial system shows widely patent arterial system popliteal stent does not demonstrate any hemodynamic shifts or narrowings.  There appears to be three-vessel runoff to the foot.   ABI's from last visit Rt=1.12 and Lt=1.07 (previous Rt=1.10 and Lt=1.06)  Duplex ultrasound of the left lower extremity arterial system demonstrates a widely patent stent with  uniform velocities no evidence of recurrent hemodynamically significant stenosis.  Duplex ultrasound of the carotid arteries obtained today demonstrates 1 to 39% diameter reduction bilateral internal carotid arteries.  There is antegrade flow of both vertebral arteries.  Normal waveforms of both subclavian's are identified.    No outpatient medications have been marked as taking for the 12/02/22 encounter (Appointment) with Gilda Crease, Latina Craver, MD.    Past Medical History:  Diagnosis Date   Arthritis    Colitis    [place]   HPV (human papilloma virus) infection    Hyperlipidemia    Ischemia of left lower extremity    Osteoporosis    Polymyalgia rheumatica (HCC)    Thyroid disease    Vaginal dysplasia     Past Surgical History:  Procedure Laterality Date   ABDOMINAL HYSTERECTOMY     complete   COLONOSCOPY WITH PROPOFOL N/A 10/04/2020   Procedure: COLONOSCOPY WITH PROPOFOL;  Surgeon: Toledo, Boykin Nearing, MD;  Location: [facility];  Service: Gastroenterology;  Laterality: N/A;   colposcobpy     COLPOSCOPY     CORONARY STENT INTERVENTION N/A 11/15/2020   Procedure: CORONARY STENT INTERVENTION;  Surgeon: Alwyn Pea, MD;  Location: [facility];  Service: Cardiovascular;  Laterality: N/A;   knee athroscopy Left    LAPAROSCOPIC HYSTERECTOMY     LEFT HEART CATH AND CORONARY ANGIOGRAPHY N/A 11/15/2020   Procedure: LEFT HEART CATH AND CORONARY ANGIOGRAPHY;  Surgeon: Alwyn Pea, MD;  Location: [facility];  Service: Cardiovascular;  Laterality: N/A;  LOWER EXTREMITY ANGIOGRAPHY Left 09/05/2020   Procedure: LOWER EXTREMITY ANGIOGRAPHY;  Surgeon: Renford Dills, MD;  Location: [facility];  Service: Cardiovascular;  Laterality: Left;   LOWER EXTREMITY ANGIOGRAPHY Left 04/10/2021   Procedure: LOWER EXTREMITY ANGIOGRAPHY;  Surgeon: Renford Dills, MD;  Location: [facility];  Service: Cardiovascular;   Laterality: Left;   LOWER EXTREMITY ANGIOGRAPHY Left 11/20/2021   Procedure: Lower Extremity Angiography;  Surgeon: Renford Dills, MD;  Location: [facility];  Service: Cardiovascular;  Laterality: Left;   LOWER EXTREMITY ANGIOGRAPHY Left 06/05/2022   Procedure: Lower Extremity Angiography;  Surgeon: Renford Dills, MD;  Location: [facility];  Service: Cardiovascular;  Laterality: Left;   LOWER EXTREMITY ANGIOGRAPHY Left 07/23/2022   Procedure: Lower Extremity Angiography;  Surgeon: Renford Dills, MD;  Location: [facility];  Service: Cardiovascular;  Laterality: Left;    Social History Social History   Tobacco Use   Smoking status: Never   Smokeless tobacco: Never  Vaping Use   Vaping Use: Never used  Substance Use Topics   Alcohol use: Yes    Alcohol/week: 3.0 standard drinks of alcohol    Types: 3 Glasses of wine per week    Comment: 3 glasses of wine per week    Drug use: Never    Family History Family History  Problem Relation Age of Onset   Cancer Mother    Pancreatic cancer Mother    Heart attack Father 102   Heart disease Father     Allergies  Allergen Reactions   Lactose Other (See Comments)    Gi upset and diarrhea  Other reaction(s): Other (See Comments) Gi upset and diarrhea Gi upset and diarrhea Gi upset and diarrhea     REVIEW OF SYSTEMS (Negative unless checked)  Constitutional: [] Weight loss  [] Fever  [] Chills Cardiac: [] Chest pain   [] Chest pressure   [] Palpitations   [] Shortness of breath when laying flat   [] Shortness of breath with exertion. Vascular:  [x] Pain in legs with walking   [] Pain in legs at rest  [] History of DVT   [] Phlebitis   [] Swelling in legs   [] Varicose veins   [] Non-healing ulcers Pulmonary:   [] Uses home oxygen   [] Productive cough   [] Hemoptysis   [] Wheeze  [] COPD   [] Asthma Neurologic:  [] Dizziness   [] Seizures   [] History of stroke   [] History of TIA  [] Aphasia   [] Vissual changes    [] Weakness or numbness in arm   [] Weakness or numbness in leg Musculoskeletal:   [] Joint swelling   [] Joint pain   [] Low back pain Hematologic:  [] Easy bruising  [] Easy bleeding   [] Hypercoagulable state   [] Anemic Gastrointestinal:  [] Diarrhea   [] Vomiting  [] Gastroesophageal reflux/heartburn   [] Difficulty swallowing. Genitourinary:  [] Chronic kidney disease   [] Difficult urination  [] Frequent urination   [] Blood in urine Skin:  [] Rashes   [] Ulcers  Psychological:  [] History of anxiety   []  History of major depression.  Physical Examination  There were no vitals filed for this visit. There is no height or weight on file to calculate BMI. Gen: WD/WN, NAD Head: [place]/AT, No temporalis wasting.  Ear/Nose/Throat: Hearing grossly intact, nares w/o erythema or drainage Eyes: PER, EOMI, sclera nonicteric.  Neck: Supple, no masses.  No bruit or JVD.  Pulmonary:  Good air movement, no audible wheezing, no use of accessory muscles.  Cardiac: RRR, normal S1, S2, no Murmurs. Vascular:  mild trophic changes, no open wounds  Vessel Right Left  Radial Palpable Palpable  PT Palpable  Palpable  DP Palpable Palpable  Gastrointestinal: soft, non-distended. No guarding/no peritoneal signs.  Musculoskeletal: M/S 5/5 throughout.  No visible deformity.  Neurologic: CN 2-12 intact. Pain and light touch intact in extremities.  Symmetrical.  Speech is fluent. Motor exam as listed above. Psychiatric: Judgment intact, Mood & affect appropriate for pt's clinical situation. Dermatologic: No rashes or ulcers noted.  No changes consistent with cellulitis.   CBC Lab Results  Component Value Date   WBC 6.5 04/11/2021   HGB 12.2 04/11/2021   HCT 36.6 04/11/2021   MCV 97.9 04/11/2021   PLT 167 04/11/2021    BMET    Component Value Date/Time   BUN 19 07/23/2022 0810   CREATININE 0.69 07/23/2022 0810   GFRNONAA >60 07/23/2022 0810   CrCl cannot be calculated (Patient's most recent lab result is older than  the maximum 21 days allowed.).  COAG No results found for: "INR", "PROTIME"  Radiology No results found.   Assessment/Plan 1. 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  The patient reports that the claudication symptoms and leg pain has improved.   The patient denies lifestyle limiting changes at this point in time.  We seem to have found a good spot and both the recent angiogram and the noninvasive studies today are very good news.   No further invasive studies, angiography or surgery at this time The patient should continue walking and begin a more formal exercise program.  The patient should continue antiplatelet therapy and aggressive treatment of the lipid abnormalities   Continued surveillance is indicated as atherosclerosis is likely to progress with time.     Patient should undergo noninvasive studies as ordered. The patient will follow up with me to review the studies.  - VAS [place] LOWER EXTREMITY ARTERIAL DUPLEX; Future - VAS [place] ABI WITH/WO TBI; Future  2. Popliteal artery injury, left, sequela Recommend:   The patient is status post successful angiogram with intervention.  The patient reports that the claudication symptoms and leg pain has improved.   The patient denies lifestyle limiting changes at this point in time.  We seem to have found a good spot and both the recent angiogram and the noninvasive studies today are very good news.   No further invasive studies, angiography or surgery at this time The patient should continue walking and begin a more formal exercise program.  The patient should continue antiplatelet therapy and aggressive treatment of the lipid abnormalities   Continued surveillance is indicated as atherosclerosis is likely to progress with time.     Patient should undergo noninvasive studies as ordered. The patient will  follow up with me to review the studies.   3. Chronic venous insufficiency No surgery or intervention at this point in time.   The patient is CEAP [id number]    I have discussed with the patient venous insufficiency and why it  causes symptoms. I have discussed with the patient the chronic skin changes that accompany venous insufficiency and the long term sequela such as infection and ulceration.  Patient will begin wearing graduated compression stockings or compression wraps on a daily basis.  The patient will put the compression on first thing in the morning and removing them in the evening. The patient is instructed specifically not to sleep in the compression.     In addition, behavioral modification  including several periods of elevation of the lower extremities during the day will be continued. I have demonstrated that proper elevation is a position with the ankles at heart level.   The patient is instructed to begin routine exercise, especially walking on a daily basis   No indication for a Lymph Pump at this time.  4. Coronary artery disease of native artery of native heart with stable angina pectoris (HCC) Continue cardiac and antihypertensive medications as already ordered and reviewed, no changes at this time.  Continue statin as ordered and reviewed, no changes at this time  Nitrates PRN for chest pain  5. Mixed hyperlipidemia Continue statin as ordered and reviewed, no changes at this time    Levora Dredge, MD  12/01/2022 9:21 PM

## 2022-12-01 NOTE — H&P (View-Only) (Signed)
MRN : [id number]  Melanie Browning is a 82 y.o. (Apr 16, 1941) female who presents with chief complaint of check circulation.  History of Present Illness:  The patient presents to [facility] for angiography of the left lower extremity.   The patient was last seen the office on 06/20/2022, for followup and review status post angiogram with intervention on 06/05/2022.    Procedure:  Rota Rex mechanical thrombectomy of the left SFA and popliteal 2.    Percutaneous transluminal angioplasty and stent placement left superficial femoral and popliteal arteries to 5 mm maximally 3.    Percutaneous transluminal angioplasty left posterior tibial to approximately 3.5 mm   Follow-up angiography dated July 23, 2022 showed widely patent reconstruction no evidence for recurrent hyperplastic stenosis.   The patient notes the initial improvement in the lower extremity symptoms has decreased. Interval shortening of the patient's claudication distance but no rest pain symptoms. No new ulcers or wounds have occurred since the last visit.  Her previous reconstructions have been short lived and she is going out of the country to [place] and will not have medical care available.   There have been no significant changes to the patient's overall health care.   No documented history of amaurosis fugax or recent TIA symptoms. There are no recent neurological changes noted. No documented history of DVT, PE or superficial thrombophlebitis. The patient denies recent episodes of angina or shortness of breath.    Previous duplex US of the left lower extremity arterial system shows widely patent arterial system popliteal stent does not demonstrate any hemodynamic shifts or narrowings.  There appears to be three-vessel runoff to the foot.   ABI's from last visit Rt=1.12 and Lt=1.07 (previous Rt=1.10 and Lt=1.06)  Duplex ultrasound of the left lower extremity arterial system demonstrates a widely patent stent with  uniform velocities no evidence of recurrent hemodynamically significant stenosis.  Duplex ultrasound of the carotid arteries obtained today demonstrates 1 to 39% diameter reduction bilateral internal carotid arteries.  There is antegrade flow of both vertebral arteries.  Normal waveforms of both subclavian's are identified.    No outpatient medications have been marked as taking for the 12/02/22 encounter (Appointment) with Gilda Crease, Latina Craver, MD.    Past Medical History:  Diagnosis Date   Arthritis    Colitis    [place]   HPV (human papilloma virus) infection    Hyperlipidemia    Ischemia of left lower extremity    Osteoporosis    Polymyalgia rheumatica (HCC)    Thyroid disease    Vaginal dysplasia     Past Surgical History:  Procedure Laterality Date   ABDOMINAL HYSTERECTOMY     complete   COLONOSCOPY WITH PROPOFOL N/A 10/04/2020   Procedure: COLONOSCOPY WITH PROPOFOL;  Surgeon: Toledo, Boykin Nearing, MD;  Location: [facility];  Service: Gastroenterology;  Laterality: N/A;   colposcobpy     COLPOSCOPY     CORONARY STENT INTERVENTION N/A 11/15/2020   Procedure: CORONARY STENT INTERVENTION;  Surgeon: Alwyn Pea, MD;  Location: [facility];  Service: Cardiovascular;  Laterality: N/A;   knee athroscopy Left    LAPAROSCOPIC HYSTERECTOMY     LEFT HEART CATH AND CORONARY ANGIOGRAPHY N/A 11/15/2020   Procedure: LEFT HEART CATH AND CORONARY ANGIOGRAPHY;  Surgeon: Alwyn Pea, MD;  Location: [facility];  Service: Cardiovascular;  Laterality: N/A;  LOWER EXTREMITY ANGIOGRAPHY Left 09/05/2020   Procedure: LOWER EXTREMITY ANGIOGRAPHY;  Surgeon: Renford Dills, MD;  Location: [facility];  Service: Cardiovascular;  Laterality: Left;   LOWER EXTREMITY ANGIOGRAPHY Left 04/10/2021   Procedure: LOWER EXTREMITY ANGIOGRAPHY;  Surgeon: Renford Dills, MD;  Location: [facility];  Service: Cardiovascular;   Laterality: Left;   LOWER EXTREMITY ANGIOGRAPHY Left 11/20/2021   Procedure: Lower Extremity Angiography;  Surgeon: Renford Dills, MD;  Location: [facility];  Service: Cardiovascular;  Laterality: Left;   LOWER EXTREMITY ANGIOGRAPHY Left 06/05/2022   Procedure: Lower Extremity Angiography;  Surgeon: Renford Dills, MD;  Location: [facility];  Service: Cardiovascular;  Laterality: Left;   LOWER EXTREMITY ANGIOGRAPHY Left 07/23/2022   Procedure: Lower Extremity Angiography;  Surgeon: Renford Dills, MD;  Location: [facility];  Service: Cardiovascular;  Laterality: Left;    Social History Social History   Tobacco Use   Smoking status: Never   Smokeless tobacco: Never  Vaping Use   Vaping Use: Never used  Substance Use Topics   Alcohol use: Yes    Alcohol/week: 3.0 standard drinks of alcohol    Types: 3 Glasses of wine per week    Comment: 3 glasses of wine per week    Drug use: Never    Family History Family History  Problem Relation Age of Onset   Cancer Mother    Pancreatic cancer Mother    Heart attack Father 102   Heart disease Father     Allergies  Allergen Reactions   Lactose Other (See Comments)    Gi upset and diarrhea  Other reaction(s): Other (See Comments) Gi upset and diarrhea Gi upset and diarrhea Gi upset and diarrhea     REVIEW OF SYSTEMS (Negative unless checked)  Constitutional: [] Weight loss  [] Fever  [] Chills Cardiac: [] Chest pain   [] Chest pressure   [] Palpitations   [] Shortness of breath when laying flat   [] Shortness of breath with exertion. Vascular:  [x] Pain in legs with walking   [] Pain in legs at rest  [] History of DVT   [] Phlebitis   [] Swelling in legs   [] Varicose veins   [] Non-healing ulcers Pulmonary:   [] Uses home oxygen   [] Productive cough   [] Hemoptysis   [] Wheeze  [] COPD   [] Asthma Neurologic:  [] Dizziness   [] Seizures   [] History of stroke   [] History of TIA  [] Aphasia   [] Vissual changes    [] Weakness or numbness in arm   [] Weakness or numbness in leg Musculoskeletal:   [] Joint swelling   [] Joint pain   [] Low back pain Hematologic:  [] Easy bruising  [] Easy bleeding   [] Hypercoagulable state   [] Anemic Gastrointestinal:  [] Diarrhea   [] Vomiting  [] Gastroesophageal reflux/heartburn   [] Difficulty swallowing. Genitourinary:  [] Chronic kidney disease   [] Difficult urination  [] Frequent urination   [] Blood in urine Skin:  [] Rashes   [] Ulcers  Psychological:  [] History of anxiety   []  History of major depression.  Physical Examination  There were no vitals filed for this visit. There is no height or weight on file to calculate BMI. Gen: WD/WN, NAD Head: [place]/AT, No temporalis wasting.  Ear/Nose/Throat: Hearing grossly intact, nares w/o erythema or drainage Eyes: PER, EOMI, sclera nonicteric.  Neck: Supple, no masses.  No bruit or JVD.  Pulmonary:  Good air movement, no audible wheezing, no use of accessory muscles.  Cardiac: RRR, normal S1, S2, no Murmurs. Vascular:  mild trophic changes, no open wounds  Vessel Right Left  Radial Palpable Palpable  PT Palpable  Palpable  DP Palpable Palpable  Gastrointestinal: soft, non-distended. No guarding/no peritoneal signs.  Musculoskeletal: M/S 5/5 throughout.  No visible deformity.  Neurologic: CN 2-12 intact. Pain and light touch intact in extremities.  Symmetrical.  Speech is fluent. Motor exam as listed above. Psychiatric: Judgment intact, Mood & affect appropriate for pt's clinical situation. Dermatologic: No rashes or ulcers noted.  No changes consistent with cellulitis.   CBC Lab Results  Component Value Date   WBC 6.5 04/11/2021   HGB 12.2 04/11/2021   HCT 36.6 04/11/2021   MCV 97.9 04/11/2021   PLT 167 04/11/2021    BMET    Component Value Date/Time   BUN 19 07/23/2022 0810   CREATININE 0.69 07/23/2022 0810   GFRNONAA >60 07/23/2022 0810   CrCl cannot be calculated (Patient's most recent lab result is older than  the maximum 21 days allowed.).  COAG No results found for: "INR", "PROTIME"  Radiology No results found.   Assessment/Plan 1. 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  The patient reports that the claudication symptoms and leg pain has improved.   The patient denies lifestyle limiting changes at this point in time.  We seem to have found a good spot and both the recent angiogram and the noninvasive studies today are very good news.   No further invasive studies, angiography or surgery at this time The patient should continue walking and begin a more formal exercise program.  The patient should continue antiplatelet therapy and aggressive treatment of the lipid abnormalities   Continued surveillance is indicated as atherosclerosis is likely to progress with time.     Patient should undergo noninvasive studies as ordered. The patient will follow up with me to review the studies.  - VAS [place] LOWER EXTREMITY ARTERIAL DUPLEX; Future - VAS [place] ABI WITH/WO TBI; Future  2. Popliteal artery injury, left, sequela Recommend:   The patient is status post successful angiogram with intervention.  The patient reports that the claudication symptoms and leg pain has improved.   The patient denies lifestyle limiting changes at this point in time.  We seem to have found a good spot and both the recent angiogram and the noninvasive studies today are very good news.   No further invasive studies, angiography or surgery at this time The patient should continue walking and begin a more formal exercise program.  The patient should continue antiplatelet therapy and aggressive treatment of the lipid abnormalities   Continued surveillance is indicated as atherosclerosis is likely to progress with time.     Patient should undergo noninvasive studies as ordered. The patient will  follow up with me to review the studies.   3. Chronic venous insufficiency No surgery or intervention at this point in time.   The patient is CEAP [id number]    I have discussed with the patient venous insufficiency and why it  causes symptoms. I have discussed with the patient the chronic skin changes that accompany venous insufficiency and the long term sequela such as infection and ulceration.  Patient will begin wearing graduated compression stockings or compression wraps on a daily basis.  The patient will put the compression on first thing in the morning and removing them in the evening. The patient is instructed specifically not to sleep in the compression.     In addition, behavioral modification  including several periods of elevation of the lower extremities during the day will be continued. I have demonstrated that proper elevation is a position with the ankles at heart level.   The patient is instructed to begin routine exercise, especially walking on a daily basis   No indication for a Lymph Pump at this time.  4. Coronary artery disease of native artery of native heart with stable angina pectoris (HCC) Continue cardiac and antihypertensive medications as already ordered and reviewed, no changes at this time.  Continue statin as ordered and reviewed, no changes at this time  Nitrates PRN for chest pain  5. Mixed hyperlipidemia Continue statin as ordered and reviewed, no changes at this time    Levora Dredge, MD  12/01/2022 9:21 PM

## 2022-12-02 ENCOUNTER — Encounter (INDEPENDENT_AMBULATORY_CARE_PROVIDER_SITE_OTHER): Payer: Self-pay | Admitting: Vascular Surgery

## 2022-12-02 ENCOUNTER — Ambulatory Visit (INDEPENDENT_AMBULATORY_CARE_PROVIDER_SITE_OTHER): Payer: Medicare Other

## 2022-12-02 ENCOUNTER — Ambulatory Visit (INDEPENDENT_AMBULATORY_CARE_PROVIDER_SITE_OTHER): Payer: Medicare Other | Admitting: Vascular Surgery

## 2022-12-02 ENCOUNTER — Telehealth: Payer: Self-pay | Admitting: Nurse Practitioner

## 2022-12-02 VITALS — BP 141/79 | HR 61 | Resp 16 | Wt 139.0 lb

## 2022-12-02 DIAGNOSIS — I70212 Atherosclerosis of native arteries of extremities with intermittent claudication, left leg: Secondary | ICD-10-CM

## 2022-12-02 DIAGNOSIS — I25118 Atherosclerotic heart disease of native coronary artery with other forms of angina pectoris: Secondary | ICD-10-CM

## 2022-12-02 DIAGNOSIS — I739 Peripheral vascular disease, unspecified: Secondary | ICD-10-CM | POA: Diagnosis not present

## 2022-12-02 DIAGNOSIS — S85002S Unspecified injury of popliteal artery, left leg, sequela: Secondary | ICD-10-CM

## 2022-12-02 DIAGNOSIS — Z9889 Other specified postprocedural states: Secondary | ICD-10-CM

## 2022-12-02 DIAGNOSIS — I2510 Atherosclerotic heart disease of native coronary artery without angina pectoris: Secondary | ICD-10-CM

## 2022-12-02 DIAGNOSIS — I872 Venous insufficiency (chronic) (peripheral): Secondary | ICD-10-CM | POA: Diagnosis not present

## 2022-12-02 DIAGNOSIS — E782 Mixed hyperlipidemia: Secondary | ICD-10-CM

## 2022-12-02 DIAGNOSIS — R232 Flushing: Secondary | ICD-10-CM

## 2022-12-02 NOTE — Telephone Encounter (Signed)
Called patient to f/u on her concerns. No answer. Left vm.

## 2022-12-05 ENCOUNTER — Encounter (INDEPENDENT_AMBULATORY_CARE_PROVIDER_SITE_OTHER): Payer: Self-pay | Admitting: Vascular Surgery

## 2022-12-06 ENCOUNTER — Other Ambulatory Visit: Payer: Self-pay | Admitting: Infectious Diseases

## 2022-12-06 DIAGNOSIS — D649 Anemia, unspecified: Secondary | ICD-10-CM

## 2022-12-06 DIAGNOSIS — R7989 Other specified abnormal findings of blood chemistry: Secondary | ICD-10-CM

## 2022-12-08 LAB — VAS US ABI WITH/WO TBI
Left ABI: 1.07
Right ABI: 1.12

## 2022-12-10 ENCOUNTER — Ambulatory Visit
Admission: RE | Admit: 2022-12-10 | Discharge: 2022-12-10 | Disposition: A | Discharge status: Home / Self Care | Payer: Medicare Other | Source: Ambulatory Visit | Attending: Infectious Diseases | Admitting: Infectious Diseases

## 2022-12-10 DIAGNOSIS — D649 Anemia, unspecified: Secondary | ICD-10-CM | POA: Diagnosis present

## 2022-12-10 DIAGNOSIS — R7989 Other specified abnormal findings of blood chemistry: Secondary | ICD-10-CM | POA: Diagnosis present

## 2022-12-18 ENCOUNTER — Other Ambulatory Visit (INDEPENDENT_AMBULATORY_CARE_PROVIDER_SITE_OTHER): Payer: Self-pay | Admitting: Vascular Surgery

## 2022-12-18 ENCOUNTER — Telehealth (INDEPENDENT_AMBULATORY_CARE_PROVIDER_SITE_OTHER): Payer: Self-pay

## 2022-12-18 ENCOUNTER — Encounter (INDEPENDENT_AMBULATORY_CARE_PROVIDER_SITE_OTHER): Payer: Self-pay | Admitting: Vascular Surgery

## 2022-12-18 DIAGNOSIS — Z9889 Other specified postprocedural states: Secondary | ICD-10-CM

## 2022-12-18 DIAGNOSIS — M79606 Pain in leg, unspecified: Secondary | ICD-10-CM

## 2022-12-18 NOTE — Telephone Encounter (Signed)
Melanie Browning called stating she has a blockage and wants to know what steps to take from here revolving around your schedule. Please advise.

## 2022-12-18 NOTE — Telephone Encounter (Signed)
I'll confirm her [place] results tomorrow and get her scheduled if occluded

## 2022-12-19 ENCOUNTER — Ambulatory Visit (INDEPENDENT_AMBULATORY_CARE_PROVIDER_SITE_OTHER): Payer: Medicare Other

## 2022-12-19 ENCOUNTER — Other Ambulatory Visit (INDEPENDENT_AMBULATORY_CARE_PROVIDER_SITE_OTHER): Payer: Medicare Other

## 2022-12-19 ENCOUNTER — Telehealth (INDEPENDENT_AMBULATORY_CARE_PROVIDER_SITE_OTHER): Payer: Self-pay

## 2022-12-19 DIAGNOSIS — I739 Peripheral vascular disease, unspecified: Secondary | ICD-10-CM | POA: Diagnosis not present

## 2022-12-19 DIAGNOSIS — Z9889 Other specified postprocedural states: Secondary | ICD-10-CM | POA: Diagnosis not present

## 2022-12-19 DIAGNOSIS — M79606 Pain in leg, unspecified: Secondary | ICD-10-CM

## 2022-12-19 NOTE — Telephone Encounter (Signed)
I attempted to contact the patient to give her the information of her left leg angio with Dr. Delana Meyer on 12/24/22 with a 1:00 pm arrival time to the [facility]. A message was left letting her know this information will be sent to [facility].

## 2022-12-19 NOTE — Telephone Encounter (Signed)
Patient called back and pre-procedure instructions were discussed and patient understood and know that the information has also been sent to her Mychart.

## 2022-12-24 ENCOUNTER — Ambulatory Visit
Admission: RE | Admit: 2022-12-24 | Discharge: 2022-12-24 | Disposition: A | Discharge status: Home / Self Care | Payer: Medicare Other | Attending: Vascular Surgery | Admitting: Vascular Surgery

## 2022-12-24 DIAGNOSIS — I998 Other disorder of circulatory system: Secondary | ICD-10-CM

## 2022-12-24 DIAGNOSIS — Z538 Procedure and treatment not carried out for other reasons: Secondary | ICD-10-CM | POA: Insufficient documentation

## 2022-12-24 DIAGNOSIS — I70212 Atherosclerosis of native arteries of extremities with intermittent claudication, left leg: Secondary | ICD-10-CM

## 2022-12-24 DIAGNOSIS — Z8619 Personal history of other infectious and parasitic diseases: Secondary | ICD-10-CM

## 2022-12-24 MED ORDER — METHYLPREDNISOLONE SODIUM SUCC 125 MG IJ SOLR: 125.0000 mg | Freq: Once | INTRAMUSCULAR | Status: DC | PRN

## 2022-12-24 MED ORDER — FAMOTIDINE 20 MG PO TABS: 40.0000 mg | ORAL_TABLET | Freq: Once | ORAL | Status: DC | PRN

## 2022-12-24 MED ORDER — ONDANSETRON HCL 4 MG/2ML IJ SOLN: 4.0000 mg | Freq: Four times a day (QID) | INTRAMUSCULAR | Status: DC | PRN

## 2022-12-24 MED ORDER — CEFAZOLIN SODIUM-DEXTROSE 2-4 GM/100ML-% IV SOLN: 2.0000 g | INTRAVENOUS | Status: DC

## 2022-12-24 MED ORDER — SODIUM CHLORIDE 0.9 % IV SOLN: INTRAVENOUS | Status: DC

## 2022-12-24 MED ORDER — HYDROMORPHONE HCL 1 MG/ML IJ SOLN: 1.0000 mg | Freq: Once | INTRAMUSCULAR | Status: DC | PRN

## 2022-12-24 MED ORDER — DIPHENHYDRAMINE HCL 50 MG/ML IJ SOLN: 50.0000 mg | Freq: Once | INTRAMUSCULAR | Status: DC | PRN

## 2022-12-24 MED ORDER — MIDAZOLAM HCL 2 MG/ML PO SYRP: 8.0000 mg | ORAL_SOLUTION | Freq: Once | ORAL | Status: DC | PRN

## 2022-12-24 NOTE — Progress Notes (Signed)
Patient to [facility] for procedure. Per MD, procedure cancelled due to outside circumstances. Message relayed to patient. Patient to expect call from [facility] regarding reschedule.

## 2022-12-24 NOTE — Telephone Encounter (Signed)
Patient needed to be rescheduled and has been rescheduled to 12/27/22 with Dr. Delana Meyer With a 9:00 am arrival time to the [facility]. Patient was informed and stated she understood.

## 2022-12-26 NOTE — Telephone Encounter (Signed)
Patient's arrival time has been moved to 6:45 am and patient has been informed.

## 2022-12-27 ENCOUNTER — Ambulatory Visit
Admission: RE | Admit: 2022-12-27 | Discharge: 2022-12-27 | Disposition: A | Discharge status: Home / Self Care | Payer: Medicare Other | Source: Ambulatory Visit | Attending: Vascular Surgery | Admitting: Vascular Surgery

## 2022-12-27 ENCOUNTER — Encounter
Admission: RE | Disposition: A | Discharge status: Home / Self Care | Payer: Self-pay | Source: Ambulatory Visit | Attending: Vascular Surgery

## 2022-12-27 ENCOUNTER — Other Ambulatory Visit: Payer: Self-pay

## 2022-12-27 DIAGNOSIS — I70229 Atherosclerosis of native arteries of extremities with rest pain, unspecified extremity: Secondary | ICD-10-CM

## 2022-12-27 DIAGNOSIS — I743 Embolism and thrombosis of arteries of the lower extremities: Secondary | ICD-10-CM

## 2022-12-27 DIAGNOSIS — I25118 Atherosclerotic heart disease of native coronary artery with other forms of angina pectoris: Secondary | ICD-10-CM | POA: Insufficient documentation

## 2022-12-27 DIAGNOSIS — T82856A Stenosis of peripheral vascular stent, initial encounter: Secondary | ICD-10-CM

## 2022-12-27 DIAGNOSIS — L97929 Non-pressure chronic ulcer of unspecified part of left lower leg with unspecified severity: Secondary | ICD-10-CM | POA: Diagnosis not present

## 2022-12-27 DIAGNOSIS — I70249 Atherosclerosis of native arteries of left leg with ulceration of unspecified site: Secondary | ICD-10-CM | POA: Diagnosis not present

## 2022-12-27 DIAGNOSIS — E782 Mixed hyperlipidemia: Secondary | ICD-10-CM | POA: Diagnosis not present

## 2022-12-27 DIAGNOSIS — I70212 Atherosclerosis of native arteries of extremities with intermittent claudication, left leg: Secondary | ICD-10-CM | POA: Insufficient documentation

## 2022-12-27 DIAGNOSIS — Z79899 Other long term (current) drug therapy: Secondary | ICD-10-CM | POA: Diagnosis not present

## 2022-12-27 DIAGNOSIS — R7302 Impaired glucose tolerance (oral): Secondary | ICD-10-CM

## 2022-12-27 DIAGNOSIS — I998 Other disorder of circulatory system: Secondary | ICD-10-CM

## 2022-12-27 DIAGNOSIS — I872 Venous insufficiency (chronic) (peripheral): Secondary | ICD-10-CM | POA: Diagnosis not present

## 2022-12-27 HISTORY — PX: LOWER EXTREMITY ANGIOGRAPHY: CATH118251

## 2022-12-27 LAB — CREATININE, SERUM
Creatinine, Ser: 0.62 mg/dL (ref 0.44–1.00)
GFR, Estimated: >60 mL/min (ref >60)

## 2022-12-27 LAB — BUN: BUN: 24 mg/dL — ABNORMAL HIGH (ref 8–23)

## 2022-12-27 SURGERY — LOWER EXTREMITY ANGIOGRAPHY
Anesthesia: Moderate Sedation | Site: Leg Lower | Laterality: Left

## 2022-12-27 MED ORDER — NITROGLYCERIN 1 MG/10 ML FOR IR/CATH LAB
INTRA_ARTERIAL | Status: DC | PRN
  Administered 2022-12-27 (×3): 200 ug via INTRA_ARTERIAL
  Administered 2022-12-27: 300 ug via INTRA_ARTERIAL
  Administered 2022-12-27 (×2): 200 ug via INTRA_ARTERIAL

## 2022-12-27 MED ORDER — MORPHINE SULFATE (PF) 4 MG/ML IV SOLN: 2.0000 mg | INTRAVENOUS | Status: DC | PRN

## 2022-12-27 MED ORDER — SODIUM CHLORIDE 0.9% FLUSH: 3.0000 mL | INTRAVENOUS | Status: DC | PRN

## 2022-12-27 MED ORDER — MIDAZOLAM HCL 2 MG/2ML IJ SOLN
INTRAMUSCULAR | Status: AC
  Filled 2022-12-27: qty 4

## 2022-12-27 MED ORDER — ONDANSETRON HCL 4 MG/2ML IJ SOLN: 4.0000 mg | Freq: Four times a day (QID) | INTRAMUSCULAR | Status: DC | PRN

## 2022-12-27 MED ORDER — SODIUM CHLORIDE 0.9 % IV SOLN: 250.0000 mL | INTRAVENOUS | Status: DC | PRN

## 2022-12-27 MED ORDER — SODIUM CHLORIDE 0.9 % IV SOLN: INTRAVENOUS | Status: DC

## 2022-12-27 MED ORDER — LABETALOL HCL 5 MG/ML IV SOLN: 10.0000 mg | INTRAVENOUS | Status: DC | PRN

## 2022-12-27 MED ORDER — ACETAMINOPHEN 325 MG PO TABS: 650.0000 mg | ORAL_TABLET | ORAL | Status: DC | PRN

## 2022-12-27 MED ORDER — HYDRALAZINE HCL 20 MG/ML IJ SOLN: 5.0000 mg | INTRAMUSCULAR | Status: DC | PRN

## 2022-12-27 MED ORDER — HEPARIN SODIUM (PORCINE) 1000 UNIT/ML IJ SOLN
INTRAMUSCULAR | Status: AC
  Filled 2022-12-27: qty 10

## 2022-12-27 MED ORDER — DIPHENHYDRAMINE HCL 50 MG/ML IJ SOLN: 50.0000 mg | Freq: Once | INTRAMUSCULAR | Status: DC | PRN

## 2022-12-27 MED ORDER — FENTANYL CITRATE (PF) 100 MCG/2ML IJ SOLN
INTRAMUSCULAR | Status: AC
  Filled 2022-12-27: qty 2

## 2022-12-27 MED ORDER — METHYLPREDNISOLONE SODIUM SUCC 125 MG IJ SOLR: 125.0000 mg | Freq: Once | INTRAMUSCULAR | Status: DC | PRN

## 2022-12-27 MED ORDER — FAMOTIDINE 20 MG PO TABS: 40.0000 mg | ORAL_TABLET | Freq: Once | ORAL | Status: DC | PRN

## 2022-12-27 MED ORDER — NITROGLYCERIN 1 MG/10 ML FOR IR/CATH LAB
INTRA_ARTERIAL | Status: AC
  Filled 2022-12-27: qty 10

## 2022-12-27 MED ORDER — CEFAZOLIN SODIUM-DEXTROSE 2-4 GM/100ML-% IV SOLN: 2.0000 g | INTRAVENOUS | Status: AC

## 2022-12-27 MED ORDER — MIDAZOLAM HCL 2 MG/2ML IJ SOLN
INTRAMUSCULAR | Status: AC
  Filled 2022-12-27: qty 2

## 2022-12-27 MED ORDER — FENTANYL CITRATE PF 50 MCG/ML IJ SOSY
PREFILLED_SYRINGE | INTRAMUSCULAR | Status: AC
  Filled 2022-12-27: qty 1

## 2022-12-27 MED ORDER — OXYCODONE HCL 5 MG PO TABS
5.0000 mg | ORAL_TABLET | ORAL | Status: DC | PRN
  Administered 2022-12-27: 5 mg via ORAL

## 2022-12-27 MED ORDER — HEPARIN SODIUM (PORCINE) 1000 UNIT/ML IJ SOLN
INTRAMUSCULAR | Status: DC | PRN
  Administered 2022-12-27: 7000 [IU] via INTRAVENOUS

## 2022-12-27 MED ORDER — OXYCODONE HCL 5 MG PO TABS
ORAL_TABLET | ORAL | Status: AC
  Filled 2022-12-27: qty 1

## 2022-12-27 MED ORDER — CEFAZOLIN SODIUM-DEXTROSE 2-4 GM/100ML-% IV SOLN
INTRAVENOUS | Status: AC
  Administered 2022-12-27: 2 g via INTRAVENOUS
  Filled 2022-12-27: qty 100

## 2022-12-27 MED ORDER — MIDAZOLAM HCL 2 MG/2ML IJ SOLN
INTRAMUSCULAR | Status: DC | PRN
  Administered 2022-12-27: 1 mg via INTRAVENOUS
  Administered 2022-12-27: 2 mg via INTRAVENOUS
  Administered 2022-12-27: .5 mg via INTRAVENOUS
  Administered 2022-12-27: 1 mg via INTRAVENOUS

## 2022-12-27 MED ORDER — MIDAZOLAM HCL 2 MG/ML PO SYRP: 8.0000 mg | ORAL_SOLUTION | Freq: Once | ORAL | Status: DC | PRN

## 2022-12-27 MED ORDER — SODIUM CHLORIDE 0.9 % IV BOLUS
250.0000 mL | Freq: Once | INTRAVENOUS | Status: AC
  Administered 2022-12-27: 250 mL via INTRAVENOUS

## 2022-12-27 MED ORDER — FENTANYL CITRATE (PF) 100 MCG/2ML IJ SOLN
INTRAMUSCULAR | Status: DC | PRN
  Administered 2022-12-27: 25 ug via INTRAVENOUS
  Administered 2022-12-27: 12.5 ug via INTRAVENOUS
  Administered 2022-12-27 (×3): 25 ug via INTRAVENOUS

## 2022-12-27 MED ORDER — HYDROMORPHONE HCL 1 MG/ML IJ SOLN: 1.0000 mg | Freq: Once | INTRAMUSCULAR | Status: DC | PRN

## 2022-12-27 MED ORDER — IODIXANOL 320 MG/ML IV SOLN
INTRAVENOUS | Status: DC | PRN
  Administered 2022-12-27: 80 mL

## 2022-12-27 MED ORDER — SODIUM CHLORIDE 0.9% FLUSH: 3.0000 mL | Freq: Two times a day (BID) | INTRAVENOUS | Status: DC

## 2022-12-27 SURGICAL SUPPLY — 41 items
BALLN LUTONIX  018 4X60X130 (BALLOONS) ×2
BALLN LUTONIX 018 4X60X130 (BALLOONS) ×2
BALLN LUTONIX 018 5X60X130 (BALLOONS) ×1
BALLN LUTONIX 018 6X100X130 (BALLOONS) ×1
BALLN LUTONIX 018 6X150X130 (BALLOONS) ×1
BALLN LUTONIX 018 6X220X130 (BALLOONS) ×1
BALLN ULTRVRSE 3X100X150 (BALLOONS) ×1
BALLN ULTRVRSE 6X40X130 (BALLOONS) ×1
BALLOON LUTONIX 018 4X60X130 (BALLOONS) IMPLANT
BALLOON LUTONIX 018 5X60X130 (BALLOONS) IMPLANT
BALLOON LUTONIX 018 6X100X130 (BALLOONS) IMPLANT
BALLOON LUTONIX 018 6X150X130 (BALLOONS) IMPLANT
BALLOON LUTONIX 018 6X220X130 (BALLOONS) IMPLANT
BALLOON ULTRVRSE 3X100X150 (BALLOONS) IMPLANT
BALLOON ULTRVRSE 6X40X130 (BALLOONS) IMPLANT
CANISTER PENUMBRA ENGINE (MISCELLANEOUS) IMPLANT
CANNULA 5F STIFF (CANNULA) IMPLANT
CATH ANGIO 5F PIGTAIL 65CM (CATHETERS) IMPLANT
CATH INDIGO CAT6 KIT (CATHETERS) IMPLANT
CATH ROTAREX 135 6FR (CATHETERS) IMPLANT
CATH VERT 100CM (CATHETERS) IMPLANT
DEVICE STARCLOSE SE CLOSURE (Vascular Products) IMPLANT
DEVICE TORQUE .025-.038 (MISCELLANEOUS) IMPLANT
GAUZE SPONGE 4X4 12PLY STRL (GAUZE/BANDAGES/DRESSINGS) IMPLANT
GLIDEWIRE ADV .035X260CM (WIRE) IMPLANT
GOWN STRL REUS W/ TWL LRG LVL3 (GOWN DISPOSABLE) ×1 IMPLANT
GOWN STRL REUS W/TWL LRG LVL3 (GOWN DISPOSABLE) ×1
GUIDEWIRE PFTE-COATED .018X300 (WIRE) IMPLANT
KIT ENCORE 26 ADVANTAGE (KITS) IMPLANT
NDL ENTRY 21GA 7CM ECHOTIP (NEEDLE) IMPLANT
NEEDLE ENTRY 21GA 7CM ECHOTIP (NEEDLE) ×1 IMPLANT
PACK ANGIOGRAPHY (CUSTOM PROCEDURE TRAY) ×1 IMPLANT
SET INTRO CAPELLA COAXIAL (SET/KITS/TRAYS/PACK) IMPLANT
SHEATH BRITE TIP 5FRX11 (SHEATH) IMPLANT
SHEATH RAABE 6FR (SHEATH) IMPLANT
STENT LIFESTENT 5F 6X60X135 (Permanent Stent) IMPLANT
STENT VIABAHN 6X150X120 (Permanent Stent) IMPLANT
SYR MEDRAD MARK 7 150ML (SYRINGE) IMPLANT
TUBING CONTRAST HIGH PRESS 72 (TUBING) IMPLANT
WIRE G V18X300CM (WIRE) IMPLANT
WIRE GUIDERIGHT .035X150 (WIRE) IMPLANT

## 2022-12-27 NOTE — Op Note (Signed)
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  Post-operative diagnosis:  Same  Procedure(s) Performed:             1.  Introduction catheter into left lower extremity 3rd order catheter placement               2.    Contrast injection left lower extremity for distal runoff             3.  Percutaneous transluminal angioplasty and stent placement left superficial femoral and popliteal arteries to 6 mm             4.  Percutaneous transluminal angioplasty left posterior tibial and tibioperoneal trunk to 3.5 mm             5.  Atherectomy of the superficial femoral artery and popliteal artery using the R[place] Rex catheter.               6.  Mechanical thrombectomy of the tibioperoneal trunk using the penumbra CAT 6 catheter.                7.  Star close closure right common femoral arteriotomy  Anesthesia: Conscious sedation was administered under my direct supervision by the interventional radiology RN. IV Versed plus fentanyl were utilized. Continuous ECG, pulse oximetry and blood pressure was monitored throughout the entire procedure.  Conscious sedation was for a total of 1 hour 44 minutes 47 seconds.  Sheath: 6 [place] Rabie right common femoral retrograde  Contrast: 80 cc  Fluoroscopy Time: 16 minutes  Indications:  Aamiyah Vehrs presents with increasing pain of the left lower extremity.  Noninvasive studies demonstrate she has reoccluded her popliteal stent.  This suggests the patient is having limb threatening ischemia.  Angiography with intervention for limb salvage is recommended.  The risks and benefits are reviewed all questions answered patient agrees to proceed.  Procedure:   Dziyah Eckholm is a 82 y.o. y.o. female who was identified and  appropriate procedural time out was performed.  The patient was then placed supine on the table and prepped and draped in the usual sterile fashion.    Ultrasound was placed in the sterile sleeve and the right groin was evaluated the right common femoral artery was echolucent and pulsatile indicating patency.  Image was recorded for the permanent record and under real-time visualization a microneedle was inserted into the common femoral artery followed by the microwire and then the micro-sheath.  A J-wire was then advanced through the micro-sheath and a  5 [place] sheath was then inserted over a J-wire. J-wire was then advanced and a 5 French pigtail catheter was positioned at the level of T12.  AP projection of the aorta was then obtained. Pigtail catheter was repositioned to above the bifurcation and a RAO view of the pelvis was obtained.  Subsequently a pigtail catheter with an Advantage wire was used to cross the aortic bifurcation.  The catheter and wire were advanced down into the left distal external iliac artery. Oblique view of the femoral bifurcation was then obtained and subsequently the wire was reintroduced and the pigtail catheter negotiated into the SFA representing third order catheter placement. Distal runoff was then performed.  7000 units of heparin was then given and allowed to circulate for several minutes.  A 6 [place]  Rabie sheath was advanced up and over the bifurcation and positioned in the femoral artery  KMP catheter and advantage Glidewire were then negotiated down into the distal popliteal and then the tibioperoneal trunk. Catheter was then advanced. Hand injection contrast demonstrated intraluminal positioning and the tibial anatomy in further detail.  A V18 wire was then introduced into the peroneal artery under fluoroscopic guidance.  The [place] Rex catheter was then prepped on the field advanced over the V18 wire and atherectomy of the SFA and popliteal stent was performed  extending down to the distal popliteal just above the origin of the anterior tibial.  Follow-up imaging demonstrated recanalization but multiple areas with greater than 50% residual stenosis particularly at the leading edge of the stent as well as in the distal edge of the stent in the below-knee popliteal.  A 5 mm Lutonix balloon was used to angioplasty the SFA and above-knee popliteal.  The inflation was for 1 minute at 12 atm. Follow-up imaging demonstrated persistent stenosis with greater than 50% stenosis at the leading edge of the stent in the SFA as well as a greater than 50% stenosis in the popliteal just distal to the bottom of the stent.  Given the findings after initial angioplasty elected to stent the SFA.  A 6 mm x 150 mm Viabahn stent which was deployed approximately 1 cm above the stenosis at the leading edge of the stent down to the level of the femoral condyles.  This was then postdilated with 6 mm balloons.  The proximal balloon was a Lutonix to place drug at the leading edge of the Viabahn.  Follow-up imaging demonstrated less than 10% residual stenosis throughout the length of the stent down to the popliteal where the below-knee popliteal lesion is now identified.  I ballooned this with a 4 mm Lutonix inflated to 8 atm for 1 minute.  Follow-up imaging demonstrated persistence of this lesion.  However, at this time I elected to treat the tibioperoneal trunk and posterior tibial.  The 018 wire was negotiated through these lesions into the distal posterior tibial and a 3 mm x 100 mm Ultraverse balloon was advanced across the tandem lesions in the anterior tibial as well as the tibioperoneal trunk.  Inflation was to 12 atm for 2 minutes.  Follow-up imaging demonstrated these lesions were markedly improved but still somewhat undersized with approximately 30% residual stenosis.  I returned to the popliteal lesion and placed a 6 mm x 60 mm life stent across this lesion and postdilated with a 5 mm  x 60 mm Lutonix drug-eluting balloon inflated to 8 atm for 1 minute.  Follow-up imaging now demonstrated thrombus within the tibioperoneal trunk.  The CAT 6 catheter was then advanced over the wire and mechanical thrombectomy was performed of the tibioperoneal trunk and proximal posterior tibial.  After this a 4 mm x 60 mm Lutonix drug-eluting balloon was advanced across the proximal posterior tibial and the entire tibioperoneal trunk inflated to approximately 3 to 4 atm for 2 full minutes.  Follow-up imaging demonstrated less than 10% residual stenosis with three-vessel runoff at the trifurcation.  Distal imaging demonstrated wide patency of the posterior tibial is the dominant artery down to the foot filling the pedal arch.  After review of these images the sheath is pulled into the right external iliac oblique of the common femoral is obtained and a Star close device deployed. There no immediate Complications.  Findings:  The abdominal aorta is opacified with a bolus injection contrast.  Renal arteries are single and widely patent without evidence of hemodynamically significant stenosis.  The aorta itself has diffuse disease but no hemodynamically significant lesions. The common and external iliac arteries are widely patent bilaterally.  The left common femoral is widely patent as is the profunda femoris.  The SFA does indeed have a significant lesion and now occludes approximately 2 to 3 cm proximal to the leading edge of the previously placed stent.  The entire stent is occluded.  There is reconstitution of the anterior tibial as well as faint reconstitution of the posterior tibial and peroneal.  Once the stents are crossed and the catheter is advanced into the distal posterior tibial the trifurcation is restudied.  The tibioperoneal trunk demonstrates diffuse greater than 70% stenosis.  The origin of the posterior tibial demonstrates a greater than 90% stenosis extending over approximately 15 mm.   Approximately 5 cm distal to this lesion there is a greater than 70% stenosis in the posterior tibial.  The peroneal demonstrates mild to moderate disease proximally and is somewhat small does not seem to collateralized to the foot significantly distally.  The anterior tibial is patent with sluggish filling distally.    Following atherectomy with the [place] Rex device there is now recanalization but multiple areas of greater than 60% residual stenosis particularly at the leading edge of the stent in the SFA as well as in the distal end of the stent in the below-knee popliteal.  There are also 2 areas of stent deformation 1 of which appears to have some stent fracture both of these areas demonstrate greater than 60% residual stenosis.  Following angioplasty there is inadequate treatment of the above-noted lesions and therefore the 6 mm Viabahn stent is deployed and postdilated with a 6 mm balloon proximally.  Distally in the below-knee popliteal the lesion does not respond to adequately to angioplasty alone and therefore the life stent is deployed and postdilated to 5 mm.  There is now less than 10% residual stenosis throughout the SFA popliteal.  Following thrombectomy there appears to be retrieval of the thrombus in the tibioperoneal trunk.  Following angioplasty tibioperoneal trunk and posterior tibial now is in-line flow and looks quite nice with less than 10% residual stenosis.   Summary: Successful recanalization left lower extremity for limb salvage                           Disposition: Patient was taken to the recovery room in stable condition having tolerated the procedure well.  Belenda Cruise Aki Abalos 12/27/2022,10:07 AM

## 2022-12-27 NOTE — OR Nursing (Signed)
Dr Delana Meyer notified of continued cramping left calf. Instructed patient that this is to be expected post procedure and elevate leg when she gets home

## 2022-12-27 NOTE — Interval H&P Note (Signed)
History and Physical Interval Note:  12/27/2022 8:05 AM  Melanie Browning  has presented today for surgery, with the diagnosis of LLE Angio   BARD    Ischemic leg.  The various methods of treatment have been discussed with the patient and family. After consideration of risks, benefits and other options for treatment, the patient has consented to  Procedure(s): Lower Extremity Angiography (Left) as a surgical intervention.  The patient's history has been reviewed, patient examined, no change in status, stable for surgery.  I have reviewed the patient's chart and labs.  Questions were answered to the patient's satisfaction.     [name]

## 2022-12-30 ENCOUNTER — Encounter: Payer: Self-pay | Admitting: Vascular Surgery

## 2022-12-31 ENCOUNTER — Other Ambulatory Visit (INDEPENDENT_AMBULATORY_CARE_PROVIDER_SITE_OTHER): Payer: Self-pay | Admitting: Vascular Surgery

## 2022-12-31 DIAGNOSIS — I739 Peripheral vascular disease, unspecified: Secondary | ICD-10-CM

## 2023-01-03 ENCOUNTER — Inpatient Hospital Stay: Payer: Medicare Other | Attending: Oncology | Admitting: Oncology

## 2023-01-03 ENCOUNTER — Encounter: Payer: Self-pay | Admitting: Oncology

## 2023-01-03 ENCOUNTER — Inpatient Hospital Stay: Payer: Medicare Other

## 2023-01-03 ENCOUNTER — Other Ambulatory Visit: Payer: Self-pay

## 2023-01-03 ENCOUNTER — Telehealth (INDEPENDENT_AMBULATORY_CARE_PROVIDER_SITE_OTHER): Payer: Self-pay | Admitting: Vascular Surgery

## 2023-01-03 ENCOUNTER — Encounter (INDEPENDENT_AMBULATORY_CARE_PROVIDER_SITE_OTHER): Payer: Self-pay | Admitting: Vascular Surgery

## 2023-01-03 VITALS — BP 115/66 | HR 81 | Temp 96.8°F | Resp 18 | Wt 139.9 lb

## 2023-01-03 DIAGNOSIS — Z90710 Acquired absence of both cervix and uterus: Secondary | ICD-10-CM | POA: Diagnosis not present

## 2023-01-03 DIAGNOSIS — E785 Hyperlipidemia, unspecified: Secondary | ICD-10-CM | POA: Diagnosis not present

## 2023-01-03 DIAGNOSIS — Z789 Other specified health status: Secondary | ICD-10-CM | POA: Diagnosis not present

## 2023-01-03 DIAGNOSIS — F1090 Alcohol use, unspecified, uncomplicated: Secondary | ICD-10-CM | POA: Diagnosis not present

## 2023-01-03 DIAGNOSIS — R7989 Other specified abnormal findings of blood chemistry: Secondary | ICD-10-CM | POA: Diagnosis not present

## 2023-01-03 DIAGNOSIS — Z85828 Personal history of other malignant neoplasm of skin: Secondary | ICD-10-CM | POA: Diagnosis not present

## 2023-01-03 DIAGNOSIS — Z809 Family history of malignant neoplasm, unspecified: Secondary | ICD-10-CM | POA: Insufficient documentation

## 2023-01-03 DIAGNOSIS — Z8249 Family history of ischemic heart disease and other diseases of the circulatory system: Secondary | ICD-10-CM | POA: Insufficient documentation

## 2023-01-03 DIAGNOSIS — D539 Nutritional anemia, unspecified: Secondary | ICD-10-CM | POA: Diagnosis not present

## 2023-01-03 DIAGNOSIS — Z800 Family history of malignant neoplasm of digestive organs: Secondary | ICD-10-CM | POA: Insufficient documentation

## 2023-01-03 DIAGNOSIS — Z7902 Long term (current) use of antithrombotics/antiplatelets: Secondary | ICD-10-CM | POA: Diagnosis not present

## 2023-01-03 DIAGNOSIS — R5383 Other fatigue: Secondary | ICD-10-CM | POA: Diagnosis not present

## 2023-01-03 DIAGNOSIS — R7401 Elevation of levels of liver transaminase levels: Secondary | ICD-10-CM

## 2023-01-03 DIAGNOSIS — M8580 Other specified disorders of bone density and structure, unspecified site: Secondary | ICD-10-CM | POA: Diagnosis not present

## 2023-01-03 DIAGNOSIS — Z79899 Other long term (current) drug therapy: Secondary | ICD-10-CM | POA: Insufficient documentation

## 2023-01-03 DIAGNOSIS — Z8541 Personal history of malignant neoplasm of cervix uteri: Secondary | ICD-10-CM | POA: Diagnosis not present

## 2023-01-03 LAB — CBC WITH DIFFERENTIAL/PLATELET
Abs Immature Granulocytes: 0.03 10*3/uL (ref 0.00–0.07)
Basophils Absolute: 0 10*3/uL (ref 0.0–0.1)
Basophils Relative: 1 %
Eosinophils Absolute: 0.1 10*3/uL (ref 0.0–0.5)
Eosinophils Relative: 1 %
HCT: 31 % — ABNORMAL LOW (ref 36.0–46.0)
Hemoglobin: 10 g/dL — ABNORMAL LOW (ref 12.0–15.0)
Immature Granulocytes: 1 %
Lymphocytes Relative: 12 %
Lymphs Abs: 0.8 10*3/uL (ref 0.7–4.0)
MCH: 32.8 pg (ref 26.0–34.0)
MCHC: 32.3 g/dL (ref 30.0–36.0)
MCV: 101.6 fL — ABNORMAL HIGH (ref 80.0–100.0)
Monocytes Absolute: 0.5 10*3/uL (ref 0.1–1.0)
Monocytes Relative: 7 %
Neutro Abs: 5.1 10*3/uL (ref 1.7–7.7)
Neutrophils Relative %: 78 %
Platelets: 178 10*3/uL (ref 150–400)
RBC: 3.05 MIL/uL — ABNORMAL LOW (ref 3.87–5.11)
RDW: 13.3 % (ref 11.5–15.5)
WBC: 6.5 10*3/uL (ref 4.0–10.5)
nRBC: 0 % (ref 0.0–0.2)

## 2023-01-03 LAB — TECHNOLOGIST SMEAR REVIEW: Plt Morphology: NORMAL

## 2023-01-03 LAB — HEPATITIS PANEL, ACUTE
HCV Ab: NONREACTIVE
Hep A IgM: NONREACTIVE
Hep B C IgM: NONREACTIVE
Hepatitis B Surface Ag: NONREACTIVE

## 2023-01-03 LAB — RETIC PANEL
Immature Retic Fract: 11.7 % (ref 2.3–15.9)
RBC.: 3.06 MIL/uL — ABNORMAL LOW (ref 3.87–5.11)
Retic Count, Absolute: 64 10*3/uL (ref 19.0–186.0)
Retic Ct Pct: 2.1 % (ref 0.4–3.1)
Reticulocyte Hemoglobin: 33.3 pg (ref >27.9)

## 2023-01-03 LAB — LACTATE DEHYDROGENASE: LDH: 183 U/L (ref 98–192)

## 2023-01-03 NOTE — Telephone Encounter (Signed)
Is being address by provider

## 2023-01-03 NOTE — Progress Notes (Signed)
Hematology/Oncology Consult note [phone] [phone]      Patient Care Team: Mick Sell, MD as PCP - General (Infectious Diseases) Benita Gutter, RN as Oncology Nurse Navigator   REFERRING PROVIDER: Mick Sell, MD  CHIEF COMPLAINTS/REASON FOR VISIT:  Anemia  ASSESSMENT & PLAN:  Macrocytic anemia  anemia with fluctuating hemoglobin levels, most recently at 10.2 g/dL. - Differential diagnosis: Hemolysis, myelodysplastic syndrome, multiple myeloma, other bone marrow disorders etc. Check CBC, reticulocyte panel, multiple myeloma panel, light chain ratio, flow cytometry, LDH, haptoglobin, smear I discussed about the possibility of bone marrow biopsy if necessary   Transaminitis Check hepatitis panel,  Alcohol use : Advised to reduce alcohol intake to assess for potential improvement in liver function tests and anemia  Orders Placed This Encounter  Procedures   CBC with Differential/Platelet    Standing Status:   Future    Number of Occurrences:   1    Standing Expiration Date:   01/03/2024   Retic Panel    Standing Status:   Future    Number of Occurrences:   1    Standing Expiration Date:   01/03/2024   Multiple Myeloma Panel (SPEP&IFE w/QIG)    Standing Status:   Future    Number of Occurrences:   1    Standing Expiration Date:   01/03/2024   Kappa/lambda light chains    Standing Status:   Future    Number of Occurrences:   1    Standing Expiration Date:   01/03/2024   Flow cytometry panel-leukemia/lymphoma work-up    Standing Status:   Future    Number of Occurrences:   1    Standing Expiration Date:   01/03/2024   Lactate dehydrogenase    Standing Status:   Future    Number of Occurrences:   1    Standing Expiration Date:   01/03/2024   Haptoglobin    Standing Status:   Future    Number of Occurrences:   1    Standing Expiration Date:   01/03/2024   Hepatitis panel, acute    Standing Status:   Future    Number of  Occurrences:   1    Standing Expiration Date:   01/03/2024   Technologist smear review    Standing Status:   Future    Number of Occurrences:   1    Standing Expiration Date:   01/03/2024    Order Specific Question:   Clinical information:    Answer:   anemia  Follow-up in 3 to 4 weeks to review results.  All questions were answered. The patient knows to call the clinic with any problems, questions or concerns.  Rickard Patience, MD, PhD [facility] Hematology Oncology 01/03/2023     HISTORY OF PRESENTING ILLNESS:  Melanie Browning is a  82 y.o.  female with PMH listed below who was referred to me for anemia  - Presented with concerns regarding anemia; hemoglobin levels fluctuating and recently worsened  - Reports recent endovascular procedure with placement of two additional stents in the popliteal artery, now totaling six stents in the leg. Experiencing issues post-procedure and has communicated concerns to Dr. Marcha Dutton ("it hasn't gone as it has before" and "something not working right"). - History of cervical cancer with a procedure performed by Dr. Artist Beach in [place] in 2016  Reviewed patient's recent labs that was done.  She was found to have abnormal CBC on 12/31/2022, hemoglobin 10.2 MCV 101.3, normal  iron panel, B12 and folate.  Patient is taking B12 and vitamin D D supplementation. Reviewed patient's previous labs ordered by primary care physician's office, anemia is chronic onset , duration is since November 2023 + Teeth, She denies recent chest pain on exertion, shortness of breath on minimal exertion, pre-syncopal episodes, or palpitations She had not noticed any recent bleeding such as epistaxis, hematuria or hematochezia.   Mild -moderate alcohol consumption, approximately four nights a week, equating to roughly one bottle of wine per week  Past medical history includes cervical cancer treated in 2016. - History of polymyalgia rheumatica, not currently active and not on  treatment  - Osteopenia, attributed to previous corticosteroid use - No history of autoimmune diseases or current treatments for such conditions.  MEDICAL HISTORY:  Past Medical History:  Diagnosis Date   Arthritis    Colitis    Dry eye    bilateral when living in [facility]   History of cervical cancer    had hysterectomy   HPV (human papilloma virus) infection    Hyperlipidemia    Ischemia of left lower extremity    Osteoporosis    Polymyalgia rheumatica    Skin cancer    nose   Thyroid disease    Vaginal dysplasia     SURGICAL HISTORY: Past Surgical History:  Procedure Laterality Date   ABDOMINAL HYSTERECTOMY     complete   COLONOSCOPY WITH PROPOFOL N/A 10/04/2020   Procedure: COLONOSCOPY WITH PROPOFOL;  Surgeon: Toledo, Boykin Nearing, MD;  Location: [facility] ENDOSCOPY;  Service: Gastroenterology;  Laterality: N/A;   colposcobpy     COLPOSCOPY     CORONARY STENT INTERVENTION N/A 11/15/2020   Procedure: CORONARY STENT INTERVENTION;  Surgeon: Alwyn Pea, MD;  Location: [facility] INVASIVE CV LAB;  Service: Cardiovascular;  Laterality: N/A;   knee athroscopy Left    LAPAROSCOPIC HYSTERECTOMY     LEFT HEART CATH AND CORONARY ANGIOGRAPHY N/A 11/15/2020   Procedure: LEFT HEART CATH AND CORONARY ANGIOGRAPHY;  Surgeon: Alwyn Pea, MD;  Location: [facility] INVASIVE CV LAB;  Service: Cardiovascular;  Laterality: N/A;   LOWER EXTREMITY ANGIOGRAPHY Left 09/05/2020   Procedure: LOWER EXTREMITY ANGIOGRAPHY;  Surgeon: Renford Dills, MD;  Location: [facility] INVAS[facility] CV [facility];  Service: Cardiovascular;  Laterality: Left;   LOWER EXTREMITY ANGIOGRAPHY Left 04/10/2021   Procedure: LOWER EXTREMITY ANGIOGRAPHY;  Surgeon: Renford Dills, MD;  Location: [facility] CV [facility];  Service: Cardiovascular;  Laterality: Left;   LOWER EXTREMITY ANGIOGRAPHY Left 11/20/2021   Procedure: Lower Extremity Angiography;  Surgeon: Renford Dills, MD;  Location: [facility] CV [facility];  Service: Cardiovascular;   Laterality: Left;   LOWER EXTREMITY ANGIOGRAPHY Left 06/05/2022   Procedure: Lower Extremity Angiography;  Surgeon: Renford Dills, MD;  Location: [facility];  Service: Cardiovascular;  Laterality: Left;   LOWER EXTREMITY ANGIOGRAPHY Left 07/23/2022   Procedure: Lower Extremity Angiography;  Surgeon: Renford Dills, MD;  Location: [facility];  Service: Cardiovascular;  Laterality: Left;   LOWER EXTREMITY ANGIOGRAPHY Left 12/27/2022   Procedure: Lower Extremity Angiography;  Surgeon: Renford Dills, MD;  Location: [facility] INV[facility] CV LAB;  Service: Cardiovascular;  Laterality: Left;    SOCIAL HISTORY: Social History   Socioeconomic History   Marital status: Married    Spouse name: Norm    Number of children: 1   Years of education: Not on file   Highest education level: Not on file  Occupational History   Occupation: retired  Tobacco Use   Smoking status: Never   Smokeless tobacco: Never  Vaping Use   Vaping Use: Never used  Substance and Sexual Activity   Alcohol use: Yes    Alcohol/week: 1.0 standard drink of alcohol    Types: 1 Glasses of wine per week   Drug use: Never   Sexual activity: Yes  Other Topics Concern   Not on file  Social History Narrative   Lives with spouse at [facility]    Social Determinants of Health   Financial Resource Strain: Not on file  Food Insecurity: No Food Insecurity (01/03/2023)   Hunger Vital Sign    Worried About Running Out of Food in the Last Year: Never true    Ran Out of Food in the Last Year: Never true  Transportation Needs: No Transportation Needs (01/03/2023)   PRAPARE - Administrator, Civil Service (Medical): No    Lack of Transportation (Non-Medical): No  Physical Activity: Not on file  Stress: Not on file  Social Connections: Not on file  Intimate Partner Violence: Not At Risk (01/03/2023)   Humiliation, Afraid, Rape, and Kick questionnaire    Fear of Current or Ex-Partner: No     Emotionally Abused: No    Physically Abused: No    Sexually Abused: No    FAMILY HISTORY: Family History  Problem Relation Age of Onset   Cancer Mother    Pancreatic cancer Mother    Heart failure Mother    Heart attack Father 73   Heart disease Father     ALLERGIES:  is allergic to lactose.  MEDICATIONS:  Current Outpatient Medications  Medication Sig Dispense Refill   aspirin EC 81 MG tablet Take 81 mg by mouth every evening. Swallow whole.     citalopram (CELEXA) 10 MG tablet Take 10 mg by mouth daily.     estradiol (ESTRACE) 0.1 MG/GM vaginal cream Insert 1/4 applicator full into vagina at night twice a week 42.5 g 2   levothyroxine (SYNTHROID) 75 MCG tablet Take 75 mcg by mouth daily before breakfast.      losartan (COZAAR) 25 MG tablet Take 25 mg by mouth daily.     ticagrelor (BRILINTA) 90 MG TABS tablet Take 1 tablet (90 mg total) by mouth 2 (two) times daily. 180 tablet 0   atorvastatin (LIPITOR) 40 MG tablet Take 40 mg by mouth every evening.  (Patient not taking: Reported on 01/03/2023)     budesonide (ENTOCORT EC) 3 MG 24 hr capsule Take 9 mg by mouth daily. (Patient not taking: Reported on 01/03/2023)     diphenoxylate-atropine (LOMOTIL) 2.5-0.025 MG tablet TAKE 1 TABLET BY MOUTH 3 TIMES DAILY AS NEEDED FOR DIARRHEA. (Patient not taking: Reported on 12/02/2022) 90 tablet 0   ezetimibe (ZETIA) 10 MG tablet Take 1 tablet (10 mg total) by mouth daily. (Patient not taking: Reported on 01/03/2023) 90 tablet 3   nitroGLYCERIN (NITROSTAT) 0.4 MG SL tablet Place under the tongue. (Patient not taking: Reported on 01/03/2023)     No current facility-administered medications for this visit.    Review of Systems  Constitutional:  Positive for fatigue. Negative for appetite change, chills and fever.  HENT:   Negative for hearing loss and voice change.   Eyes:  Negative for eye problems.  Respiratory:  Negative for chest tightness and cough.   Cardiovascular:  Negative for chest pain.   Gastrointestinal:  Negative for abdominal distention, abdominal pain and blood in stool.  Endocrine: Negative for  hot flashes.  Genitourinary:  Negative for difficulty urinating and frequency.   Musculoskeletal:  Negative for arthralgias.  Skin:  Negative for itching and rash.  Neurological:  Negative for extremity weakness.  Hematological:  Negative for adenopathy.  Psychiatric/Behavioral:  Negative for confusion.     PHYSICAL EXAMINATION: Vitals:   01/03/23 1114  BP: 115/66  Pulse: 81  Resp: 18  Temp: (!) 96.8 F (36 C)   Filed Weights   01/03/23 1114  Weight: 139 lb 14.4 oz (63.5 kg)    Physical Exam Constitutional:      General: She is not in acute distress. HENT:     Head: Normocephalic and atraumatic.  Eyes:     General: No scleral icterus. Cardiovascular:     Rate and Rhythm: Normal rate and regular rhythm.     Heart sounds: Normal heart sounds.  Pulmonary:     Effort: Pulmonary effort is normal. No respiratory distress.     Breath sounds: No wheezing.  Abdominal:     General: Bowel sounds are normal. There is no distension.     Palpations: Abdomen is soft.  Musculoskeletal:        General: No deformity. Normal range of motion.     Cervical back: Normal range of motion and neck supple.  Skin:    General: Skin is warm and dry.     Findings: No erythema or rash.  Neurological:     Mental Status: She is alert and oriented to person, place, and time. Mental status is at baseline.     Cranial Nerves: No cranial nerve deficit.     Coordination: Coordination normal.  Psychiatric:        Mood and Affect: Mood normal.      LABORATORY DATA:  I have reviewed the data as listed    Latest Ref Rng & Units 01/03/2023   11:42 AM 04/11/2021    7:00 AM  CBC  WBC 4.0 - 10.5 K/uL 6.5  6.5   Hemoglobin 12.0 - 15.0 g/dL 40.9  81.1   Hematocrit 36.0 - 46.0 % 31.0  36.6   Platelets 150 - 400 K/uL 178  167       Latest Ref Rng & Units 12/27/2022    7:31 AM 07/23/2022     8:10 AM 06/05/2022   10:57 AM  CMP  BUN 8 - 23 mg/dL 24  19  17    Creatinine 0.44 - 1.00 mg/dL 9.14  7.82  9.56    No results found for: "IRON", "TIBC", "FERRITIN", "IRONPCTSAT"   RADIOGRAPHIC STUDIES: I have personally reviewed the radiological images as listed and agreed with the findings in the report. PERIPHERAL VASCULAR CATHETERIZATION  Result Date: 12/27/2022 See surgical note for result.  VAS [place] LOWER EXTREMITY ARTERIAL DUPLEX  Result Date: 12/23/2022 LOWER EXTREMITY ARTERIAL DUPLEX STUDY Patient Name:  Melanie Browning  Date of Exam:   12/19/2022 Medical Rec #: [id number]      Accession #:    [id number] Date of Birth: 1941-08-14       Patient Gender: F Patient Age:   21 years Exam Location:  [facility] Procedure:      VAS [place] LOWER EXTREMITY ARTERIAL DUPLEX Referring Phys: Levora Dredge --------------------------------------------------------------------------------  Indications: Peripheral artery disease, and probable occlusion of placed stents;              left SFA distally.  Vascular Interventions: 10/13/2019: Left popliteal artery PTA;  10/14/2019: Left popliteal artery reconstruction;                         02/03/2020: Left popliteal artery angioplasty;                         04/10/2021: Left distal SFA/Popliteal Artery                         thrombectomy/PTA/stent;                          11/20/2021: PTA and Stent placement left SFA and                         Popliteal Artery. PTA Left tibioperoneal trunk and                         Posterior Tibial Artery.                         06/05/2022 [place] mechanical thrombectomy of the left                         SFA and popliteal                         3. Percutaneous transluminal angioplasty and stent                         placement left superficial femoral and popliteal                         arteries to 5 mm maximally                         4. Percutaneous transluminal angioplasty left posterior                          tibial to approximately 3.5 mm. Current ABI:            n/a Performing Technologist: Salvadore Farber RVT  Examination Guidelines: A complete evaluation includes B-mode imaging, spectral Doppler, color Doppler, and power Doppler as needed of all accessible portions of each vessel. Bilateral testing is considered an integral part of a complete examination. Limited examinations for reoccurring indications may be performed as noted.   +----------+--------+-----+--------+--------+--------+ LEFT      PSV cm/sRatioStenosisWaveformComments +----------+--------+-----+--------+--------+--------+ DFA       65                                    +----------+--------+-----+--------+--------+--------+ SFA Prox  31                                    +----------+--------+-----+--------+--------+--------+ SFA Mid                occluded                 +----------+--------+-----+--------+--------+--------+ SFA Distal             occluded                 +----------+--------+-----+--------+--------+--------+  POP Prox               occluded                 +----------+--------+-----+--------+--------+--------+ POP Distal             occluded                 +----------+--------+-----+--------+--------+--------+ TP Trunk               occluded                 +----------+--------+-----+--------+--------+--------+ ATA Distal17                                    +----------+--------+-----+--------+--------+--------+ PTA Distal14                                    +----------+--------+-----+--------+--------+--------+  Summary: Left: Left mid SFA and stents distally to TP trunk are totally occluded. Dampened monophasic flow in the distal ATA and PTA.  See table(s) above for measurements and observations. Electronically signed by Levora Dredge MD on 12/23/2022 at 4:33:39 PM.    Final    US Abdomen Limited RUQ (LIVER/GB)  Result Date:  12/10/2022 CLINICAL DATA:  Elevated LFTs. EXAM: ULTRASOUND ABDOMEN LIMITED RIGHT UPPER QUADRANT COMPARISON:  None Available. FINDINGS: Gallbladder: No gallstones or wall thickening visualized. No sonographic Murphy sign noted by sonographer. Common bile duct: Diameter: 1.7 mm Liver: No focal lesion identified. Within normal limits in parenchymal echogenicity. Portal vein is patent on color Doppler imaging with normal direction of blood flow towards the liver. Other: None. IMPRESSION: No cholelithiasis or sonographic evidence for acute cholecystitis. Electronically Signed   By: Annia Belt M.D.   On: 12/10/2022 12:12

## 2023-01-03 NOTE — Assessment & Plan Note (Signed)
:   Advised to reduce alcohol intake to assess for potential improvement in liver function tests and anemia

## 2023-01-03 NOTE — Assessment & Plan Note (Signed)
anemia with fluctuating hemoglobin levels, most recently at 10.2 g/dL. - Differential diagnosis: Hemolysis, myelodysplastic syndrome, multiple myeloma, other bone marrow disorders etc. Check CBC, reticulocyte panel, multiple myeloma panel, light chain ratio, flow cytometry, LDH, haptoglobin, smear I discussed about the possibility of bone marrow biopsy if necessary

## 2023-01-03 NOTE — Progress Notes (Signed)
Pt here for follow up. No new concerns voiced.   

## 2023-01-03 NOTE — Assessment & Plan Note (Signed)
Check hepatitis panel,

## 2023-01-03 NOTE — Telephone Encounter (Signed)
Patient called and stated she is having a lot of pain that she does not usually have after procedure and she is wondering is she has a blood clot.  Please advise.

## 2023-01-05 LAB — HAPTOGLOBIN: Haptoglobin: 132 mg/dL (ref 42–346)

## 2023-01-06 ENCOUNTER — Ambulatory Visit (INDEPENDENT_AMBULATORY_CARE_PROVIDER_SITE_OTHER): Payer: Medicare Other

## 2023-01-06 DIAGNOSIS — I739 Peripheral vascular disease, unspecified: Secondary | ICD-10-CM

## 2023-01-06 DIAGNOSIS — Z9889 Other specified postprocedural states: Secondary | ICD-10-CM

## 2023-01-06 LAB — COMP PANEL: LEUKEMIA/LYMPHOMA

## 2023-01-06 LAB — VAS US ABI WITH/WO TBI
Left ABI: 1.24
Right ABI: 1.16

## 2023-01-06 LAB — KAPPA/LAMBDA LIGHT CHAINS
Kappa free light chain: 24.1 mg/L — ABNORMAL HIGH (ref 3.3–19.4)
Kappa, lambda light chain ratio: 1.18 (ref 0.26–1.65)
Lambda free light chains: 20.5 mg/L (ref 5.7–26.3)

## 2023-01-08 LAB — MULTIPLE MYELOMA PANEL, SERUM
Albumin SerPl Elph-Mcnc: 3.7 g/dL (ref 2.9–4.4)
Albumin/Glob SerPl: 1.4 (ref 0.7–1.7)
Alpha 1: 0.2 g/dL (ref 0.0–0.4)
Alpha2 Glob SerPl Elph-Mcnc: 0.7 g/dL (ref 0.4–1.0)
B-Globulin SerPl Elph-Mcnc: 0.9 g/dL (ref 0.7–1.3)
Gamma Glob SerPl Elph-Mcnc: 0.9 g/dL (ref 0.4–1.8)
Globulin, Total: 2.8 g/dL (ref 2.2–3.9)
IgA: 198 mg/dL (ref 64–422)
IgG (Immunoglobin G), Serum: 1051 mg/dL (ref 586–1602)
IgM (Immunoglobulin M), Srm: 60 mg/dL (ref 26–217)
Total Protein ELP: 6.5 g/dL (ref 6.0–8.5)

## 2023-01-10 ENCOUNTER — Encounter (INDEPENDENT_AMBULATORY_CARE_PROVIDER_SITE_OTHER): Payer: Medicare Other

## 2023-01-12 NOTE — Progress Notes (Unsigned)
biphasic stent    +----------+--------+-----+--------+---------+--------+ POP Distal46                   biphasic stent    +----------+--------+-----+--------+---------+--------+ TP Trunk  43                   biphasic stent     +----------+--------+-----+--------+---------+--------+ ATA Distal48                   biphasic          +----------+--------+-----+--------+---------+--------+ PTA Prox  50                   biphasic          +----------+--------+-----+--------+---------+--------+ PTA Distal74                   triphasic         +----------+--------+-----+--------+---------+--------+  Summary: Left: Widely patent left LEA system and stents throughout.  See table(s) above for measurements and observations. Electronically signed by Levora Dredge MD on 01/06/2023 at 4:57:30 PM.    Final    PERIPHERAL VASCULAR CATHETERIZATION  Result Date: 12/27/2022 See surgical note for result.  VAS [place] LOWER EXTREMITY ARTERIAL DUPLEX  Result Date: 12/23/2022 LOWER EXTREMITY ARTERIAL DUPLEX STUDY Patient Name:  Melanie Browning  Date of Exam:   12/19/2022 Medical Rec #: [id number]      Accession #:    [id number] Date of Birth: 05/18/1941       Patient Gender: F Patient Age:   82 years Exam Location:  [facility] Procedure:      VAS [place] LOWER EXTREMITY ARTERIAL DUPLEX Referring Phys: Levora Dredge --------------------------------------------------------------------------------  Indications: Peripheral artery disease, and probable occlusion of placed stents;              left SFA distally.  Vascular Interventions: 10/13/2019: Left popliteal artery PTA;                         10/14/2019: Left popliteal artery reconstruction;                         02/03/2020: Left popliteal artery angioplasty;                         04/10/2021: Left distal SFA/Popliteal Artery                         thrombectomy/PTA/stent;                          11/20/2021: PTA and Stent placement left SFA and                         Popliteal Artery. PTA Left tibioperoneal trunk and                         Posterior Tibial Artery.                         06/05/2022 Rota Rex mechanical thrombectomy of the left                         SFA and  popliteal  Great Toe165               1.18                  +---------+------------------+-----+---------+-------+ +-------+-----------+-----------+------------+------------+ ABI/TBIToday's ABIToday's TBIPrevious ABIPrevious TBI +-------+-----------+-----------+------------+------------+ Right  1.16       .95        1.12        1.02         +-------+-----------+-----------+------------+------------+ Left   1.24       1.18       1.07        1.05         +-------+-----------+-----------+------------+------------+  Summary: Right: Resting right ankle-brachial index is within normal  range. The right toe-brachial index is normal. Left: Resting left ankle-brachial index is within normal range. The left toe-brachial index is normal. Entire LEA system including new stents are patent. *See table(s) above for measurements and observations.  Electronically signed by Levora Dredge MD on 01/06/2023 at 4:57:36 PM.    Final    VAS [place] LOWER EXTREMITY ARTERIAL DUPLEX  Result Date: 01/06/2023 LOWER EXTREMITY ARTERIAL DUPLEX STUDY Patient Name:  Melanie Browning  Date of Exam:   01/06/2023 Medical Rec #: [id number]      Accession #:    [id number] Date of Birth: 1941-02-28       Patient Gender: F Patient Age:   82 years Exam Location:  [facility] Procedure:      VAS [place] LOWER EXTREMITY ARTERIAL DUPLEX Referring Phys: Levora Dredge --------------------------------------------------------------------------------   Vascular Interventions: 12/27/2022 Percutaneous transluminal angioplasty and                         stent placement left superficial femoral and popliteal                         arteries to 6 mm                         4. Percutaneous transluminal angioplasty left posterior                         tibial and tibioperoneal trunk to 3.5 mm                         5. Atherectomy of the superficial femoral artery and                         popliteal artery using the Rota Rex catheter.                         6. Mechanical thrombectomy of the tibioperoneal trunk                         using the penumbra CAT 6 catheter.                         10/13/2019: Left popliteal artery PTA;                         10/14/2019: Left popliteal artery reconstruction;                         02/03/2020:  Great Toe165               1.18                  +---------+------------------+-----+---------+-------+ +-------+-----------+-----------+------------+------------+ ABI/TBIToday's ABIToday's TBIPrevious ABIPrevious TBI +-------+-----------+-----------+------------+------------+ Right  1.16       .95        1.12        1.02         +-------+-----------+-----------+------------+------------+ Left   1.24       1.18       1.07        1.05         +-------+-----------+-----------+------------+------------+  Summary: Right: Resting right ankle-brachial index is within normal  range. The right toe-brachial index is normal. Left: Resting left ankle-brachial index is within normal range. The left toe-brachial index is normal. Entire LEA system including new stents are patent. *See table(s) above for measurements and observations.  Electronically signed by Levora Dredge MD on 01/06/2023 at 4:57:36 PM.    Final    VAS [place] LOWER EXTREMITY ARTERIAL DUPLEX  Result Date: 01/06/2023 LOWER EXTREMITY ARTERIAL DUPLEX STUDY Patient Name:  Melanie Browning  Date of Exam:   01/06/2023 Medical Rec #: [id number]      Accession #:    [id number] Date of Birth: 1941-02-28       Patient Gender: F Patient Age:   82 years Exam Location:  [facility] Procedure:      VAS [place] LOWER EXTREMITY ARTERIAL DUPLEX Referring Phys: Levora Dredge --------------------------------------------------------------------------------   Vascular Interventions: 12/27/2022 Percutaneous transluminal angioplasty and                         stent placement left superficial femoral and popliteal                         arteries to 6 mm                         4. Percutaneous transluminal angioplasty left posterior                         tibial and tibioperoneal trunk to 3.5 mm                         5. Atherectomy of the superficial femoral artery and                         popliteal artery using the Rota Rex catheter.                         6. Mechanical thrombectomy of the tibioperoneal trunk                         using the penumbra CAT 6 catheter.                         10/13/2019: Left popliteal artery PTA;                         10/14/2019: Left popliteal artery reconstruction;                         02/03/2020:  Lab Results  Component Value Date   WBC 6.5 01/03/2023   HGB 10.0 (L) 01/03/2023   HCT 31.0 (L) 01/03/2023   MCV 101.6 (H) 01/03/2023   PLT 178 01/03/2023    BMET    Component Value Date/Time   BUN 24 (H) 12/27/2022 0731   CREATININE 0.62 12/27/2022 0731   GFRNONAA >60 12/27/2022 0731   Estimated Creatinine Clearance: 56.2 mL/min (by C-G formula based on SCr of 0.62 mg/dL).  COAG No results found for: "INR", "PROTIME"  Radiology VAS [place] ABI WITH/WO TBI  Result Date: 01/06/2023  LOWER EXTREMITY DOPPLER STUDY Patient Name:  Melanie Browning  Date of Exam:   01/06/2023 Medical Rec #: [id number]      Accession #:    [id number] Date of Birth: 1941/04/25       Patient Gender: F Patient Age:   24 years Exam Location:  [facility] Procedure:      VAS [place] ABI WITH/WO TBI Referring Phys: --------------------------------------------------------------------------------  Indications: Peripheral artery disease.  Vascular Interventions: 12/27/2022  Percutaneous transluminal angioplasty and                         stent placement left superficial femoral and popliteal                         arteries to 6 mm                         4. Percutaneous transluminal angioplasty left posterior                         tibial and tibioperoneal trunk to 3.5 mm                         5. Atherectomy of the superficial femoral artery and                         popliteal artery using the Rota Rex catheter.                         6. Mechanical thrombectomy of the tibioperoneal trunk                         using the penumbra CAT 6 catheter.                         10/13/2019: Left popliteal artery PTA;                         10/14/2019: Left popliteal artery reconstruction;                         02/03/2020: Left popliteal artery angioplasty;                         04/10/2021: Left distal SFA/Popliteal Artery                         thrombectomy/PTA/stent;                          11/20/2021: PTA and Stent placement left  Lab Results  Component Value Date   WBC 6.5 01/03/2023   HGB 10.0 (L) 01/03/2023   HCT 31.0 (L) 01/03/2023   MCV 101.6 (H) 01/03/2023   PLT 178 01/03/2023    BMET    Component Value Date/Time   BUN 24 (H) 12/27/2022 0731   CREATININE 0.62 12/27/2022 0731   GFRNONAA >60 12/27/2022 0731   Estimated Creatinine Clearance: 56.2 mL/min (by C-G formula based on SCr of 0.62 mg/dL).  COAG No results found for: "INR", "PROTIME"  Radiology VAS [place] ABI WITH/WO TBI  Result Date: 01/06/2023  LOWER EXTREMITY DOPPLER STUDY Patient Name:  Melanie Browning  Date of Exam:   01/06/2023 Medical Rec #: [id number]      Accession #:    [id number] Date of Birth: 1941/04/25       Patient Gender: F Patient Age:   24 years Exam Location:  [facility] Procedure:      VAS [place] ABI WITH/WO TBI Referring Phys: --------------------------------------------------------------------------------  Indications: Peripheral artery disease.  Vascular Interventions: 12/27/2022  Percutaneous transluminal angioplasty and                         stent placement left superficial femoral and popliteal                         arteries to 6 mm                         4. Percutaneous transluminal angioplasty left posterior                         tibial and tibioperoneal trunk to 3.5 mm                         5. Atherectomy of the superficial femoral artery and                         popliteal artery using the Rota Rex catheter.                         6. Mechanical thrombectomy of the tibioperoneal trunk                         using the penumbra CAT 6 catheter.                         10/13/2019: Left popliteal artery PTA;                         10/14/2019: Left popliteal artery reconstruction;                         02/03/2020: Left popliteal artery angioplasty;                         04/10/2021: Left distal SFA/Popliteal Artery                         thrombectomy/PTA/stent;                          11/20/2021: PTA and Stent placement left  biphasic stent    +----------+--------+-----+--------+---------+--------+ POP Distal46                   biphasic stent    +----------+--------+-----+--------+---------+--------+ TP Trunk  43                   biphasic stent     +----------+--------+-----+--------+---------+--------+ ATA Distal48                   biphasic          +----------+--------+-----+--------+---------+--------+ PTA Prox  50                   biphasic          +----------+--------+-----+--------+---------+--------+ PTA Distal74                   triphasic         +----------+--------+-----+--------+---------+--------+  Summary: Left: Widely patent left LEA system and stents throughout.  See table(s) above for measurements and observations. Electronically signed by Levora Dredge MD on 01/06/2023 at 4:57:30 PM.    Final    PERIPHERAL VASCULAR CATHETERIZATION  Result Date: 12/27/2022 See surgical note for result.  VAS [place] LOWER EXTREMITY ARTERIAL DUPLEX  Result Date: 12/23/2022 LOWER EXTREMITY ARTERIAL DUPLEX STUDY Patient Name:  Melanie Browning  Date of Exam:   12/19/2022 Medical Rec #: [id number]      Accession #:    [id number] Date of Birth: 05/18/1941       Patient Gender: F Patient Age:   82 years Exam Location:  [facility] Procedure:      VAS [place] LOWER EXTREMITY ARTERIAL DUPLEX Referring Phys: Levora Dredge --------------------------------------------------------------------------------  Indications: Peripheral artery disease, and probable occlusion of placed stents;              left SFA distally.  Vascular Interventions: 10/13/2019: Left popliteal artery PTA;                         10/14/2019: Left popliteal artery reconstruction;                         02/03/2020: Left popliteal artery angioplasty;                         04/10/2021: Left distal SFA/Popliteal Artery                         thrombectomy/PTA/stent;                          11/20/2021: PTA and Stent placement left SFA and                         Popliteal Artery. PTA Left tibioperoneal trunk and                         Posterior Tibial Artery.                         06/05/2022 Rota Rex mechanical thrombectomy of the left                         SFA and  popliteal  Lab Results  Component Value Date   WBC 6.5 01/03/2023   HGB 10.0 (L) 01/03/2023   HCT 31.0 (L) 01/03/2023   MCV 101.6 (H) 01/03/2023   PLT 178 01/03/2023    BMET    Component Value Date/Time   BUN 24 (H) 12/27/2022 0731   CREATININE 0.62 12/27/2022 0731   GFRNONAA >60 12/27/2022 0731   Estimated Creatinine Clearance: 56.2 mL/min (by C-G formula based on SCr of 0.62 mg/dL).  COAG No results found for: "INR", "PROTIME"  Radiology VAS [place] ABI WITH/WO TBI  Result Date: 01/06/2023  LOWER EXTREMITY DOPPLER STUDY Patient Name:  Melanie Browning  Date of Exam:   01/06/2023 Medical Rec #: [id number]      Accession #:    [id number] Date of Birth: 1941/04/25       Patient Gender: F Patient Age:   24 years Exam Location:  [facility] Procedure:      VAS [place] ABI WITH/WO TBI Referring Phys: --------------------------------------------------------------------------------  Indications: Peripheral artery disease.  Vascular Interventions: 12/27/2022  Percutaneous transluminal angioplasty and                         stent placement left superficial femoral and popliteal                         arteries to 6 mm                         4. Percutaneous transluminal angioplasty left posterior                         tibial and tibioperoneal trunk to 3.5 mm                         5. Atherectomy of the superficial femoral artery and                         popliteal artery using the Rota Rex catheter.                         6. Mechanical thrombectomy of the tibioperoneal trunk                         using the penumbra CAT 6 catheter.                         10/13/2019: Left popliteal artery PTA;                         10/14/2019: Left popliteal artery reconstruction;                         02/03/2020: Left popliteal artery angioplasty;                         04/10/2021: Left distal SFA/Popliteal Artery                         thrombectomy/PTA/stent;                          11/20/2021: PTA and Stent placement left  Great Toe165               1.18                  +---------+------------------+-----+---------+-------+ +-------+-----------+-----------+------------+------------+ ABI/TBIToday's ABIToday's TBIPrevious ABIPrevious TBI +-------+-----------+-----------+------------+------------+ Right  1.16       .95        1.12        1.02         +-------+-----------+-----------+------------+------------+ Left   1.24       1.18       1.07        1.05         +-------+-----------+-----------+------------+------------+  Summary: Right: Resting right ankle-brachial index is within normal  range. The right toe-brachial index is normal. Left: Resting left ankle-brachial index is within normal range. The left toe-brachial index is normal. Entire LEA system including new stents are patent. *See table(s) above for measurements and observations.  Electronically signed by Levora Dredge MD on 01/06/2023 at 4:57:36 PM.    Final    VAS [place] LOWER EXTREMITY ARTERIAL DUPLEX  Result Date: 01/06/2023 LOWER EXTREMITY ARTERIAL DUPLEX STUDY Patient Name:  Melanie Browning  Date of Exam:   01/06/2023 Medical Rec #: [id number]      Accession #:    [id number] Date of Birth: 1941-02-28       Patient Gender: F Patient Age:   82 years Exam Location:  [facility] Procedure:      VAS [place] LOWER EXTREMITY ARTERIAL DUPLEX Referring Phys: Levora Dredge --------------------------------------------------------------------------------   Vascular Interventions: 12/27/2022 Percutaneous transluminal angioplasty and                         stent placement left superficial femoral and popliteal                         arteries to 6 mm                         4. Percutaneous transluminal angioplasty left posterior                         tibial and tibioperoneal trunk to 3.5 mm                         5. Atherectomy of the superficial femoral artery and                         popliteal artery using the Rota Rex catheter.                         6. Mechanical thrombectomy of the tibioperoneal trunk                         using the penumbra CAT 6 catheter.                         10/13/2019: Left popliteal artery PTA;                         10/14/2019: Left popliteal artery reconstruction;                         02/03/2020:

## 2023-01-13 ENCOUNTER — Ambulatory Visit (INDEPENDENT_AMBULATORY_CARE_PROVIDER_SITE_OTHER): Payer: Medicare Other | Admitting: Vascular Surgery

## 2023-01-13 ENCOUNTER — Encounter (INDEPENDENT_AMBULATORY_CARE_PROVIDER_SITE_OTHER): Payer: Self-pay | Admitting: Vascular Surgery

## 2023-01-13 VITALS — BP 100/66 | HR 88 | Resp 16

## 2023-01-13 DIAGNOSIS — I70212 Atherosclerosis of native arteries of extremities with intermittent claudication, left leg: Secondary | ICD-10-CM

## 2023-01-13 DIAGNOSIS — E785 Hyperlipidemia, unspecified: Secondary | ICD-10-CM

## 2023-01-13 DIAGNOSIS — I25118 Atherosclerotic heart disease of native coronary artery with other forms of angina pectoris: Secondary | ICD-10-CM

## 2023-01-13 DIAGNOSIS — I872 Venous insufficiency (chronic) (peripheral): Secondary | ICD-10-CM | POA: Diagnosis not present

## 2023-01-13 DIAGNOSIS — S85002S Unspecified injury of popliteal artery, left leg, sequela: Secondary | ICD-10-CM | POA: Diagnosis not present

## 2023-01-14 ENCOUNTER — Encounter (INDEPENDENT_AMBULATORY_CARE_PROVIDER_SITE_OTHER): Payer: Self-pay | Admitting: Vascular Surgery

## 2023-01-14 MED ORDER — HYDROCODONE-ACETAMINOPHEN 5-325 MG PO TABS: 1.0000 | ORAL_TABLET | Freq: Four times a day (QID) | ORAL | 0 refills | Status: DC | PRN

## 2023-01-19 ENCOUNTER — Encounter: Payer: Self-pay | Admitting: Oncology

## 2023-01-19 ENCOUNTER — Encounter (INDEPENDENT_AMBULATORY_CARE_PROVIDER_SITE_OTHER): Payer: Self-pay

## 2023-01-23 ENCOUNTER — Telehealth (INDEPENDENT_AMBULATORY_CARE_PROVIDER_SITE_OTHER): Payer: Self-pay

## 2023-01-24 ENCOUNTER — Encounter (INDEPENDENT_AMBULATORY_CARE_PROVIDER_SITE_OTHER): Payer: Self-pay | Admitting: Vascular Surgery

## 2023-01-24 NOTE — Telephone Encounter (Signed)
Just FYI pt is on schedule with getting the 2nd opinion in [place] at the [facility] office

## 2023-01-26 NOTE — Progress Notes (Unsigned)
intact. Pain and light touch intact in extremities.  Symmetrical.  Speech is fluent. Motor exam as listed above. Psychiatric: Judgment intact, Mood & affect appropriate for pt's clinical situation. Dermatologic: No rashes or ulcers noted.  No changes consistent with cellulitis.   CBC Lab Results  Component Value Date   WBC 6.5 01/03/2023   HGB 10.0 (L) 01/03/2023   HCT 31.0 (L) 01/03/2023   MCV 101.6 (H) 01/03/2023   PLT 178 01/03/2023    BMET    Component Value Date/Time   BUN 24 (H) 12/27/2022 0731   CREATININE 0.62 12/27/2022 0731   GFRNONAA >60 12/27/2022 0731   CrCl cannot be calculated (Patient's most recent lab result is older than the maximum 21 days allowed.).  COAG No results found for: "INR", "PROTIME"  Radiology VAS [place] ABI WITH/WO TBI  Result Date: 01/06/2023  LOWER EXTREMITY DOPPLER STUDY Patient Name:  Melanie Browning  Date of Exam:   01/06/2023 Medical Rec #: [id number]      Accession #:    [id number] Date of Birth: 1940-12-12       Patient Gender: F Patient Age:   82 years Exam Location:  [facility] Procedure:      VAS [place] ABI WITH/WO  TBI Referring Phys: --------------------------------------------------------------------------------  Indications: Peripheral artery disease.  Vascular Interventions: 12/27/2022 Percutaneous transluminal angioplasty and                         stent placement left superficial femoral and popliteal                         arteries to 6 mm                         4. Percutaneous transluminal angioplasty left posterior                         tibial and tibioperoneal trunk to 3.5 mm                         5. Atherectomy of the superficial femoral artery and                         popliteal artery using the Rota Rex catheter.                         6. Mechanical thrombectomy of the tibioperoneal trunk                         using the penumbra CAT 6 catheter.                         10/13/2019: Left popliteal artery PTA;                         10/14/2019: Left popliteal artery reconstruction;                         02/03/2020: Left popliteal artery angioplasty;                         04/10/2021: Left distal SFA/Popliteal Artery  MRN : [id number]  Melanie Browning is a 82 y.o. (07-12-1941) female who presents with chief complaint of check circulation.  History of Present Illness:   The patient returns to the office for followup and review status post angiogram with intervention on 12/27/2022.    Procedure:  1.  Percutaneous transluminal angioplasty and stent placement left superficial femoral and popliteal arteries to 6 mm   2.  Percutaneous transluminal angioplasty left posterior tibial and tibioperoneal trunk to 3.5 mm   3.  Atherectomy of the superficial femoral artery and popliteal artery using the Rota Rex catheter.     4.  Mechanical thrombectomy of the tibioperoneal trunk using the penumbra CAT 6 catheter.     The patient notes over the past couple days her leg has began feeling much better.  She actually was comfortable walking the dog.  She has noticed that the black and blue in the calf area is mostly gone but that there is now black and blue down by her ankle.     No documented history of amaurosis fugax or recent TIA symptoms. There are no recent neurological changes noted. No documented history of DVT, PE or superficial thrombophlebitis. The patient denies recent episodes of angina or shortness of breath.    Previous ABI's Rt= 1.16 and Lt= 1.24 (previous ABI's Rt= 1.12 and Lt= 1.07) Previous duplex US of the left lower extremity arterial system shows the popliteal and distal SFA stents are widely patent.  There appears to be three-vessel runoff with triphasic signals     No outpatient medications have been marked as taking for the 01/27/23 encounter (Appointment) with Gilda Crease, Latina Craver, MD.    Past Medical History:  Diagnosis Date   Arthritis    Colitis    Dry eye    bilateral when living in [facility]   History of cervical cancer    had hysterectomy   HPV (human papilloma virus) infection    Hyperlipidemia    Ischemia of left lower extremity    Osteoporosis    Polymyalgia  rheumatica (HCC)    Skin cancer    nose   Thyroid disease    Vaginal dysplasia     Past Surgical History:  Procedure Laterality Date   ABDOMINAL HYSTERECTOMY     complete   COLONOSCOPY WITH PROPOFOL N/A 10/04/2020   Procedure: COLONOSCOPY WITH PROPOFOL;  Surgeon: Toledo, Boykin Nearing, MD;  Location: [facility];  Service: Gastroenterology;  Laterality: N/A;   colposcobpy     COLPOSCOPY     CORONARY STENT INTERVENTION N/A 11/15/2020   Procedure: CORONARY STENT INTERVENTION;  Surgeon: Alwyn Pea, MD;  Location: [facility];  Service: Cardiovascular;  Laterality: N/A;   knee athroscopy Left    LAPAROSCOPIC HYSTERECTOMY     LEFT HEART CATH AND CORONARY ANGIOGRAPHY N/A 11/15/2020   Procedure: LEFT HEART CATH AND CORONARY ANGIOGRAPHY;  Surgeon: Alwyn Pea, MD;  Location: [facility];  Service: Cardiovascular;  Laterality: N/A;   LOWER EXTREMITY ANGIOGRAPHY Left 09/05/2020   Procedure: LOWER EXTREMITY ANGIOGRAPHY;  Surgeon: Renford Dills, MD;  Location: [facility];  Service: Cardiovascular;  Laterality: Left;   LOWER EXTREMITY ANGIOGRAPHY Left 04/10/2021   Procedure: LOWER EXTREMITY ANGIOGRAPHY;  Surgeon: Renford Dills, MD;  Location: [facility];  Service: Cardiovascular;  Laterality: Left;   LOWER EXTREMITY ANGIOGRAPHY Left 11/20/2021   Procedure: Lower Extremity  intact. Pain and light touch intact in extremities.  Symmetrical.  Speech is fluent. Motor exam as listed above. Psychiatric: Judgment intact, Mood & affect appropriate for pt's clinical situation. Dermatologic: No rashes or ulcers noted.  No changes consistent with cellulitis.   CBC Lab Results  Component Value Date   WBC 6.5 01/03/2023   HGB 10.0 (L) 01/03/2023   HCT 31.0 (L) 01/03/2023   MCV 101.6 (H) 01/03/2023   PLT 178 01/03/2023    BMET    Component Value Date/Time   BUN 24 (H) 12/27/2022 0731   CREATININE 0.62 12/27/2022 0731   GFRNONAA >60 12/27/2022 0731   CrCl cannot be calculated (Patient's most recent lab result is older than the maximum 21 days allowed.).  COAG No results found for: "INR", "PROTIME"  Radiology VAS [place] ABI WITH/WO TBI  Result Date: 01/06/2023  LOWER EXTREMITY DOPPLER STUDY Patient Name:  Melanie Browning  Date of Exam:   01/06/2023 Medical Rec #: [id number]      Accession #:    [id number] Date of Birth: 1940-12-12       Patient Gender: F Patient Age:   82 years Exam Location:  [facility] Procedure:      VAS [place] ABI WITH/WO  TBI Referring Phys: --------------------------------------------------------------------------------  Indications: Peripheral artery disease.  Vascular Interventions: 12/27/2022 Percutaneous transluminal angioplasty and                         stent placement left superficial femoral and popliteal                         arteries to 6 mm                         4. Percutaneous transluminal angioplasty left posterior                         tibial and tibioperoneal trunk to 3.5 mm                         5. Atherectomy of the superficial femoral artery and                         popliteal artery using the Rota Rex catheter.                         6. Mechanical thrombectomy of the tibioperoneal trunk                         using the penumbra CAT 6 catheter.                         10/13/2019: Left popliteal artery PTA;                         10/14/2019: Left popliteal artery reconstruction;                         02/03/2020: Left popliteal artery angioplasty;                         04/10/2021: Left distal SFA/Popliteal Artery  intact. Pain and light touch intact in extremities.  Symmetrical.  Speech is fluent. Motor exam as listed above. Psychiatric: Judgment intact, Mood & affect appropriate for pt's clinical situation. Dermatologic: No rashes or ulcers noted.  No changes consistent with cellulitis.   CBC Lab Results  Component Value Date   WBC 6.5 01/03/2023   HGB 10.0 (L) 01/03/2023   HCT 31.0 (L) 01/03/2023   MCV 101.6 (H) 01/03/2023   PLT 178 01/03/2023    BMET    Component Value Date/Time   BUN 24 (H) 12/27/2022 0731   CREATININE 0.62 12/27/2022 0731   GFRNONAA >60 12/27/2022 0731   CrCl cannot be calculated (Patient's most recent lab result is older than the maximum 21 days allowed.).  COAG No results found for: "INR", "PROTIME"  Radiology VAS [place] ABI WITH/WO TBI  Result Date: 01/06/2023  LOWER EXTREMITY DOPPLER STUDY Patient Name:  Melanie Browning  Date of Exam:   01/06/2023 Medical Rec #: [id number]      Accession #:    [id number] Date of Birth: 1940-12-12       Patient Gender: F Patient Age:   82 years Exam Location:  [facility] Procedure:      VAS [place] ABI WITH/WO  TBI Referring Phys: --------------------------------------------------------------------------------  Indications: Peripheral artery disease.  Vascular Interventions: 12/27/2022 Percutaneous transluminal angioplasty and                         stent placement left superficial femoral and popliteal                         arteries to 6 mm                         4. Percutaneous transluminal angioplasty left posterior                         tibial and tibioperoneal trunk to 3.5 mm                         5. Atherectomy of the superficial femoral artery and                         popliteal artery using the Rota Rex catheter.                         6. Mechanical thrombectomy of the tibioperoneal trunk                         using the penumbra CAT 6 catheter.                         10/13/2019: Left popliteal artery PTA;                         10/14/2019: Left popliteal artery reconstruction;                         02/03/2020: Left popliteal artery angioplasty;                         04/10/2021: Left distal SFA/Popliteal Artery  PTA      173               1.24 triphasic        +---------+------------------+-----+---------+-------+ Great Toe165               1.18                  +---------+------------------+-----+---------+-------+ +-------+-----------+-----------+------------+------------+ ABI/TBIToday's ABIToday's TBIPrevious ABIPrevious TBI +-------+-----------+-----------+------------+------------+ Right  1.16       .95        1.12        1.02         +-------+-----------+-----------+------------+------------+ Left   1.24        1.18       1.07        1.05         +-------+-----------+-----------+------------+------------+  Summary: Right: Resting right ankle-brachial index is within normal range. The right toe-brachial index is normal. Left: Resting left ankle-brachial index is within normal range. The left toe-brachial index is normal. Entire LEA system including new stents are patent. *See table(s) above for measurements and observations.  Electronically signed by Levora Dredge MD on 01/06/2023 at 4:57:36 PM.    Final    VAS [place] LOWER EXTREMITY ARTERIAL DUPLEX  Result Date: 01/06/2023 LOWER EXTREMITY ARTERIAL DUPLEX STUDY Patient Name:  Melanie Browning  Date of Exam:   01/06/2023 Medical Rec #: [id number]      Accession #:    [id number] Date of Birth: 25-May-1941       Patient Gender: F Patient Age:   11 years Exam Location:  [facility] Procedure:      VAS [place] LOWER EXTREMITY ARTERIAL DUPLEX Referring Phys: Levora Dredge --------------------------------------------------------------------------------   Vascular Interventions: 12/27/2022 Percutaneous transluminal angioplasty and                         stent placement left superficial femoral and popliteal                         arteries to 6 mm                         4. Percutaneous transluminal angioplasty left posterior                         tibial and tibioperoneal trunk to 3.5 mm                         5. Atherectomy of the superficial femoral artery and                         popliteal artery using the Rota Rex catheter.                         6. Mechanical thrombectomy of the tibioperoneal trunk                         using the penumbra CAT 6 catheter.                         10/13/2019: Left popliteal artery PTA;  PTA      173               1.24 triphasic        +---------+------------------+-----+---------+-------+ Great Toe165               1.18                  +---------+------------------+-----+---------+-------+ +-------+-----------+-----------+------------+------------+ ABI/TBIToday's ABIToday's TBIPrevious ABIPrevious TBI +-------+-----------+-----------+------------+------------+ Right  1.16       .95        1.12        1.02         +-------+-----------+-----------+------------+------------+ Left   1.24        1.18       1.07        1.05         +-------+-----------+-----------+------------+------------+  Summary: Right: Resting right ankle-brachial index is within normal range. The right toe-brachial index is normal. Left: Resting left ankle-brachial index is within normal range. The left toe-brachial index is normal. Entire LEA system including new stents are patent. *See table(s) above for measurements and observations.  Electronically signed by Levora Dredge MD on 01/06/2023 at 4:57:36 PM.    Final    VAS [place] LOWER EXTREMITY ARTERIAL DUPLEX  Result Date: 01/06/2023 LOWER EXTREMITY ARTERIAL DUPLEX STUDY Patient Name:  Melanie Browning  Date of Exam:   01/06/2023 Medical Rec #: [id number]      Accession #:    [id number] Date of Birth: 25-May-1941       Patient Gender: F Patient Age:   11 years Exam Location:  [facility] Procedure:      VAS [place] LOWER EXTREMITY ARTERIAL DUPLEX Referring Phys: Levora Dredge --------------------------------------------------------------------------------   Vascular Interventions: 12/27/2022 Percutaneous transluminal angioplasty and                         stent placement left superficial femoral and popliteal                         arteries to 6 mm                         4. Percutaneous transluminal angioplasty left posterior                         tibial and tibioperoneal trunk to 3.5 mm                         5. Atherectomy of the superficial femoral artery and                         popliteal artery using the Rota Rex catheter.                         6. Mechanical thrombectomy of the tibioperoneal trunk                         using the penumbra CAT 6 catheter.                         10/13/2019: Left popliteal artery PTA;  MRN : [id number]  Melanie Browning is a 82 y.o. (07-12-1941) female who presents with chief complaint of check circulation.  History of Present Illness:   The patient returns to the office for followup and review status post angiogram with intervention on 12/27/2022.    Procedure:  1.  Percutaneous transluminal angioplasty and stent placement left superficial femoral and popliteal arteries to 6 mm   2.  Percutaneous transluminal angioplasty left posterior tibial and tibioperoneal trunk to 3.5 mm   3.  Atherectomy of the superficial femoral artery and popliteal artery using the Rota Rex catheter.     4.  Mechanical thrombectomy of the tibioperoneal trunk using the penumbra CAT 6 catheter.     The patient notes over the past couple days her leg has began feeling much better.  She actually was comfortable walking the dog.  She has noticed that the black and blue in the calf area is mostly gone but that there is now black and blue down by her ankle.     No documented history of amaurosis fugax or recent TIA symptoms. There are no recent neurological changes noted. No documented history of DVT, PE or superficial thrombophlebitis. The patient denies recent episodes of angina or shortness of breath.    Previous ABI's Rt= 1.16 and Lt= 1.24 (previous ABI's Rt= 1.12 and Lt= 1.07) Previous duplex US of the left lower extremity arterial system shows the popliteal and distal SFA stents are widely patent.  There appears to be three-vessel runoff with triphasic signals     No outpatient medications have been marked as taking for the 01/27/23 encounter (Appointment) with Gilda Crease, Latina Craver, MD.    Past Medical History:  Diagnosis Date   Arthritis    Colitis    Dry eye    bilateral when living in [facility]   History of cervical cancer    had hysterectomy   HPV (human papilloma virus) infection    Hyperlipidemia    Ischemia of left lower extremity    Osteoporosis    Polymyalgia  rheumatica (HCC)    Skin cancer    nose   Thyroid disease    Vaginal dysplasia     Past Surgical History:  Procedure Laterality Date   ABDOMINAL HYSTERECTOMY     complete   COLONOSCOPY WITH PROPOFOL N/A 10/04/2020   Procedure: COLONOSCOPY WITH PROPOFOL;  Surgeon: Toledo, Boykin Nearing, MD;  Location: [facility];  Service: Gastroenterology;  Laterality: N/A;   colposcobpy     COLPOSCOPY     CORONARY STENT INTERVENTION N/A 11/15/2020   Procedure: CORONARY STENT INTERVENTION;  Surgeon: Alwyn Pea, MD;  Location: [facility];  Service: Cardiovascular;  Laterality: N/A;   knee athroscopy Left    LAPAROSCOPIC HYSTERECTOMY     LEFT HEART CATH AND CORONARY ANGIOGRAPHY N/A 11/15/2020   Procedure: LEFT HEART CATH AND CORONARY ANGIOGRAPHY;  Surgeon: Alwyn Pea, MD;  Location: [facility];  Service: Cardiovascular;  Laterality: N/A;   LOWER EXTREMITY ANGIOGRAPHY Left 09/05/2020   Procedure: LOWER EXTREMITY ANGIOGRAPHY;  Surgeon: Renford Dills, MD;  Location: [facility];  Service: Cardiovascular;  Laterality: Left;   LOWER EXTREMITY ANGIOGRAPHY Left 04/10/2021   Procedure: LOWER EXTREMITY ANGIOGRAPHY;  Surgeon: Renford Dills, MD;  Location: [facility];  Service: Cardiovascular;  Laterality: Left;   LOWER EXTREMITY ANGIOGRAPHY Left 11/20/2021   Procedure: Lower Extremity  intact. Pain and light touch intact in extremities.  Symmetrical.  Speech is fluent. Motor exam as listed above. Psychiatric: Judgment intact, Mood & affect appropriate for pt's clinical situation. Dermatologic: No rashes or ulcers noted.  No changes consistent with cellulitis.   CBC Lab Results  Component Value Date   WBC 6.5 01/03/2023   HGB 10.0 (L) 01/03/2023   HCT 31.0 (L) 01/03/2023   MCV 101.6 (H) 01/03/2023   PLT 178 01/03/2023    BMET    Component Value Date/Time   BUN 24 (H) 12/27/2022 0731   CREATININE 0.62 12/27/2022 0731   GFRNONAA >60 12/27/2022 0731   CrCl cannot be calculated (Patient's most recent lab result is older than the maximum 21 days allowed.).  COAG No results found for: "INR", "PROTIME"  Radiology VAS [place] ABI WITH/WO TBI  Result Date: 01/06/2023  LOWER EXTREMITY DOPPLER STUDY Patient Name:  Melanie Browning  Date of Exam:   01/06/2023 Medical Rec #: [id number]      Accession #:    [id number] Date of Birth: 1940-12-12       Patient Gender: F Patient Age:   82 years Exam Location:  [facility] Procedure:      VAS [place] ABI WITH/WO  TBI Referring Phys: --------------------------------------------------------------------------------  Indications: Peripheral artery disease.  Vascular Interventions: 12/27/2022 Percutaneous transluminal angioplasty and                         stent placement left superficial femoral and popliteal                         arteries to 6 mm                         4. Percutaneous transluminal angioplasty left posterior                         tibial and tibioperoneal trunk to 3.5 mm                         5. Atherectomy of the superficial femoral artery and                         popliteal artery using the Rota Rex catheter.                         6. Mechanical thrombectomy of the tibioperoneal trunk                         using the penumbra CAT 6 catheter.                         10/13/2019: Left popliteal artery PTA;                         10/14/2019: Left popliteal artery reconstruction;                         02/03/2020: Left popliteal artery angioplasty;                         04/10/2021: Left distal SFA/Popliteal Artery

## 2023-01-27 ENCOUNTER — Ambulatory Visit (INDEPENDENT_AMBULATORY_CARE_PROVIDER_SITE_OTHER): Payer: Medicare Other | Admitting: Vascular Surgery

## 2023-01-27 ENCOUNTER — Encounter (INDEPENDENT_AMBULATORY_CARE_PROVIDER_SITE_OTHER): Payer: Self-pay | Admitting: Vascular Surgery

## 2023-01-27 VITALS — BP 154/84 | HR 84 | Resp 16 | Wt 139.9 lb

## 2023-01-27 DIAGNOSIS — I70212 Atherosclerosis of native arteries of extremities with intermittent claudication, left leg: Secondary | ICD-10-CM

## 2023-01-27 DIAGNOSIS — I872 Venous insufficiency (chronic) (peripheral): Secondary | ICD-10-CM | POA: Diagnosis not present

## 2023-01-27 DIAGNOSIS — S85002S Unspecified injury of popliteal artery, left leg, sequela: Secondary | ICD-10-CM

## 2023-01-27 DIAGNOSIS — I2510 Atherosclerotic heart disease of native coronary artery without angina pectoris: Secondary | ICD-10-CM

## 2023-01-27 DIAGNOSIS — E782 Mixed hyperlipidemia: Secondary | ICD-10-CM

## 2023-01-27 NOTE — Telephone Encounter (Signed)
They received the records and she wanted you to know.Melanie Browning

## 2023-01-28 ENCOUNTER — Encounter (INDEPENDENT_AMBULATORY_CARE_PROVIDER_SITE_OTHER): Payer: Self-pay | Admitting: Vascular Surgery

## 2023-01-29 ENCOUNTER — Encounter (INDEPENDENT_AMBULATORY_CARE_PROVIDER_SITE_OTHER): Payer: Self-pay | Admitting: Vascular Surgery

## 2023-01-31 ENCOUNTER — Encounter: Payer: Self-pay | Admitting: Oncology

## 2023-01-31 ENCOUNTER — Inpatient Hospital Stay: Payer: Medicare Other | Attending: Oncology | Admitting: Oncology

## 2023-01-31 VITALS — BP 130/75 | HR 82 | Temp 96.0°F | Resp 17 | Wt 140.0 lb

## 2023-01-31 DIAGNOSIS — E039 Hypothyroidism, unspecified: Secondary | ICD-10-CM | POA: Insufficient documentation

## 2023-01-31 DIAGNOSIS — Z85828 Personal history of other malignant neoplasm of skin: Secondary | ICD-10-CM | POA: Diagnosis not present

## 2023-01-31 DIAGNOSIS — Z8541 Personal history of malignant neoplasm of cervix uteri: Secondary | ICD-10-CM | POA: Insufficient documentation

## 2023-01-31 DIAGNOSIS — Z79899 Other long term (current) drug therapy: Secondary | ICD-10-CM | POA: Diagnosis not present

## 2023-01-31 DIAGNOSIS — Z7902 Long term (current) use of antithrombotics/antiplatelets: Secondary | ICD-10-CM | POA: Diagnosis not present

## 2023-01-31 DIAGNOSIS — Z8249 Family history of ischemic heart disease and other diseases of the circulatory system: Secondary | ICD-10-CM | POA: Insufficient documentation

## 2023-01-31 DIAGNOSIS — Z789 Other specified health status: Secondary | ICD-10-CM | POA: Diagnosis not present

## 2023-01-31 DIAGNOSIS — M353 Polymyalgia rheumatica: Secondary | ICD-10-CM | POA: Diagnosis not present

## 2023-01-31 DIAGNOSIS — Z800 Family history of malignant neoplasm of digestive organs: Secondary | ICD-10-CM | POA: Diagnosis not present

## 2023-01-31 DIAGNOSIS — Z809 Family history of malignant neoplasm, unspecified: Secondary | ICD-10-CM | POA: Diagnosis not present

## 2023-01-31 DIAGNOSIS — I739 Peripheral vascular disease, unspecified: Secondary | ICD-10-CM | POA: Diagnosis not present

## 2023-01-31 DIAGNOSIS — Z7289 Other problems related to lifestyle: Secondary | ICD-10-CM | POA: Diagnosis not present

## 2023-01-31 DIAGNOSIS — R7401 Elevation of levels of liver transaminase levels: Secondary | ICD-10-CM | POA: Diagnosis not present

## 2023-01-31 DIAGNOSIS — Z90710 Acquired absence of both cervix and uterus: Secondary | ICD-10-CM | POA: Insufficient documentation

## 2023-01-31 DIAGNOSIS — E785 Hyperlipidemia, unspecified: Secondary | ICD-10-CM | POA: Diagnosis not present

## 2023-01-31 DIAGNOSIS — D539 Nutritional anemia, unspecified: Secondary | ICD-10-CM | POA: Diagnosis present

## 2023-01-31 NOTE — Assessment & Plan Note (Addendum)
Encouraged her alcohol cessation effort.

## 2023-01-31 NOTE — Progress Notes (Signed)
Patient here for oncology follow-up appointment, expresses no new concerns at this time.    

## 2023-01-31 NOTE — Assessment & Plan Note (Signed)
Negative hepatitis panel, probably due to alcohol

## 2023-01-31 NOTE — Progress Notes (Signed)
Rate and Rhythm: Normal rate and regular rhythm.     Heart sounds: Normal heart sounds.  Pulmonary:     Effort: Pulmonary effort is normal. No respiratory distress.     Breath sounds: No wheezing.  Abdominal:     General: Bowel sounds are normal. There is no distension.     Palpations: Abdomen is soft.  Musculoskeletal:        General: No deformity. Normal range of motion.     Cervical back: Normal range of motion and neck supple.  Skin:    General: Skin is warm and dry.     Findings: No erythema or rash.  Neurological:     Mental Status: She is alert and oriented to person, place, and time. Mental status is at baseline.     Cranial Nerves: No cranial nerve deficit.     Coordination: Coordination normal.  Psychiatric:        Mood and Affect: Mood normal.      LABORATORY DATA:  I have reviewed the data as listed    Latest Ref Rng & Units 01/03/2023   11:42 AM 04/11/2021    7:00 AM  CBC  WBC 4.0 - 10.5 K/uL 6.5  6.5   Hemoglobin 12.0 - 15.0 g/dL 16.1  09.6   Hematocrit 36.0 - 46.0 % 31.0  36.6   Platelets 150 - 400 K/uL 178  167       Latest Ref Rng & Units 12/27/2022    7:31 AM 07/23/2022    8:10 AM 06/05/2022   10:57 AM  CMP  BUN 8 - 23 mg/dL 24  19  17    Creatinine 0.44 - 1.00 mg/dL 0.45  4.09  8.11    No results found for: "IRON", "TIBC", "FERRITIN", "IRONPCTSAT"   RADIOGRAPHIC STUDIES: I have personally reviewed the radiological images as listed and agreed with the findings in the report. VAS [place] ABI WITH/WO TBI  Result Date: 01/06/2023  LOWER EXTREMITY DOPPLER STUDY Patient Name:  Melanie Browning  Date of Exam:   01/06/2023 Medical Rec #: [id number]      Accession #:     [id number] Date of Birth: January 02, 1941       Patient Gender: F Patient Age:   69 years Exam Location:  [facility] Procedure:      VAS [place] ABI WITH/WO TBI Referring Phys: --------------------------------------------------------------------------------  Indications: Peripheral artery disease.  Vascular Interventions: 12/27/2022 Percutaneous transluminal angioplasty and                         stent placement left superficial femoral and popliteal                         arteries to 6 mm                         4. Percutaneous transluminal angioplasty left posterior                         tibial and tibioperoneal trunk to 3.5 mm                         5. Atherectomy of the superficial femoral artery and                         popliteal artery using the  ATA      158               1.13 triphasic         +---------+------------------+-----+---------+--------+ PTA      162               1.16 triphasic         +---------+------------------+-----+---------+--------+ Great Toe133               0.95 Normal            +---------+------------------+-----+---------+--------+ +---------+------------------+-----+---------+-------+ Left     Lt Pressure (mmHg)IndexWaveform Comment +---------+------------------+-----+---------+-------+ Brachial 140                                     +---------+------------------+-----+---------+-------+ ATA      170               1.21 triphasic        +---------+------------------+-----+---------+-------+ PTA      173               1.24 triphasic        +---------+------------------+-----+---------+-------+ Great Toe165               1.18                  +---------+------------------+-----+---------+-------+ +-------+-----------+-----------+------------+------------+ ABI/TBIToday's ABIToday's TBIPrevious ABIPrevious TBI  +-------+-----------+-----------+------------+------------+ Right  1.16       .95        1.12        1.02         +-------+-----------+-----------+------------+------------+ Left   1.24       1.18       1.07        1.05         +-------+-----------+-----------+------------+------------+  Summary: Right: Resting right ankle-brachial index is within normal range. The right toe-brachial index is normal. Left: Resting left ankle-brachial index is within normal range. The left toe-brachial index is normal. Entire LEA system including new stents are patent. *See table(s) above for measurements and observations.  Electronically signed by Levora Dredge MD on 01/06/2023 at 4:57:36 PM.    Final    VAS [place] LOWER EXTREMITY ARTERIAL DUPLEX  Result Date: 01/06/2023 LOWER EXTREMITY ARTERIAL DUPLEX STUDY Patient Name:  Melanie Browning  Date of Exam:   01/06/2023 Medical Rec #: [id number]      Accession #:    [id number] Date of Birth: 04-Oct-1940       Patient Gender: F Patient Age:   82 years Exam Location:  [facility] Procedure:      VAS [place] LOWER EXTREMITY ARTERIAL DUPLEX Referring Phys: Levora Dredge --------------------------------------------------------------------------------   Vascular Interventions: 12/27/2022 Percutaneous transluminal angioplasty and                         stent placement left superficial femoral and popliteal                         arteries to 6 mm                         4. Percutaneous transluminal angioplasty left posterior                         tibial and tibioperoneal trunk to 3.5 mm  ATA      158               1.13 triphasic         +---------+------------------+-----+---------+--------+ PTA      162               1.16 triphasic         +---------+------------------+-----+---------+--------+ Great Toe133               0.95 Normal            +---------+------------------+-----+---------+--------+ +---------+------------------+-----+---------+-------+ Left     Lt Pressure (mmHg)IndexWaveform Comment +---------+------------------+-----+---------+-------+ Brachial 140                                     +---------+------------------+-----+---------+-------+ ATA      170               1.21 triphasic        +---------+------------------+-----+---------+-------+ PTA      173               1.24 triphasic        +---------+------------------+-----+---------+-------+ Great Toe165               1.18                  +---------+------------------+-----+---------+-------+ +-------+-----------+-----------+------------+------------+ ABI/TBIToday's ABIToday's TBIPrevious ABIPrevious TBI  +-------+-----------+-----------+------------+------------+ Right  1.16       .95        1.12        1.02         +-------+-----------+-----------+------------+------------+ Left   1.24       1.18       1.07        1.05         +-------+-----------+-----------+------------+------------+  Summary: Right: Resting right ankle-brachial index is within normal range. The right toe-brachial index is normal. Left: Resting left ankle-brachial index is within normal range. The left toe-brachial index is normal. Entire LEA system including new stents are patent. *See table(s) above for measurements and observations.  Electronically signed by Levora Dredge MD on 01/06/2023 at 4:57:36 PM.    Final    VAS [place] LOWER EXTREMITY ARTERIAL DUPLEX  Result Date: 01/06/2023 LOWER EXTREMITY ARTERIAL DUPLEX STUDY Patient Name:  Melanie Browning  Date of Exam:   01/06/2023 Medical Rec #: [id number]      Accession #:    [id number] Date of Birth: 04-Oct-1940       Patient Gender: F Patient Age:   82 years Exam Location:  [facility] Procedure:      VAS [place] LOWER EXTREMITY ARTERIAL DUPLEX Referring Phys: Levora Dredge --------------------------------------------------------------------------------   Vascular Interventions: 12/27/2022 Percutaneous transluminal angioplasty and                         stent placement left superficial femoral and popliteal                         arteries to 6 mm                         4. Percutaneous transluminal angioplasty left posterior                         tibial and tibioperoneal trunk to 3.5 mm  ATA      158               1.13 triphasic         +---------+------------------+-----+---------+--------+ PTA      162               1.16 triphasic         +---------+------------------+-----+---------+--------+ Great Toe133               0.95 Normal            +---------+------------------+-----+---------+--------+ +---------+------------------+-----+---------+-------+ Left     Lt Pressure (mmHg)IndexWaveform Comment +---------+------------------+-----+---------+-------+ Brachial 140                                     +---------+------------------+-----+---------+-------+ ATA      170               1.21 triphasic        +---------+------------------+-----+---------+-------+ PTA      173               1.24 triphasic        +---------+------------------+-----+---------+-------+ Great Toe165               1.18                  +---------+------------------+-----+---------+-------+ +-------+-----------+-----------+------------+------------+ ABI/TBIToday's ABIToday's TBIPrevious ABIPrevious TBI  +-------+-----------+-----------+------------+------------+ Right  1.16       .95        1.12        1.02         +-------+-----------+-----------+------------+------------+ Left   1.24       1.18       1.07        1.05         +-------+-----------+-----------+------------+------------+  Summary: Right: Resting right ankle-brachial index is within normal range. The right toe-brachial index is normal. Left: Resting left ankle-brachial index is within normal range. The left toe-brachial index is normal. Entire LEA system including new stents are patent. *See table(s) above for measurements and observations.  Electronically signed by Levora Dredge MD on 01/06/2023 at 4:57:36 PM.    Final    VAS [place] LOWER EXTREMITY ARTERIAL DUPLEX  Result Date: 01/06/2023 LOWER EXTREMITY ARTERIAL DUPLEX STUDY Patient Name:  Melanie Browning  Date of Exam:   01/06/2023 Medical Rec #: [id number]      Accession #:    [id number] Date of Birth: 04-Oct-1940       Patient Gender: F Patient Age:   82 years Exam Location:  [facility] Procedure:      VAS [place] LOWER EXTREMITY ARTERIAL DUPLEX Referring Phys: Levora Dredge --------------------------------------------------------------------------------   Vascular Interventions: 12/27/2022 Percutaneous transluminal angioplasty and                         stent placement left superficial femoral and popliteal                         arteries to 6 mm                         4. Percutaneous transluminal angioplasty left posterior                         tibial and tibioperoneal trunk to 3.5 mm  ATA      158               1.13 triphasic         +---------+------------------+-----+---------+--------+ PTA      162               1.16 triphasic         +---------+------------------+-----+---------+--------+ Great Toe133               0.95 Normal            +---------+------------------+-----+---------+--------+ +---------+------------------+-----+---------+-------+ Left     Lt Pressure (mmHg)IndexWaveform Comment +---------+------------------+-----+---------+-------+ Brachial 140                                     +---------+------------------+-----+---------+-------+ ATA      170               1.21 triphasic        +---------+------------------+-----+---------+-------+ PTA      173               1.24 triphasic        +---------+------------------+-----+---------+-------+ Great Toe165               1.18                  +---------+------------------+-----+---------+-------+ +-------+-----------+-----------+------------+------------+ ABI/TBIToday's ABIToday's TBIPrevious ABIPrevious TBI  +-------+-----------+-----------+------------+------------+ Right  1.16       .95        1.12        1.02         +-------+-----------+-----------+------------+------------+ Left   1.24       1.18       1.07        1.05         +-------+-----------+-----------+------------+------------+  Summary: Right: Resting right ankle-brachial index is within normal range. The right toe-brachial index is normal. Left: Resting left ankle-brachial index is within normal range. The left toe-brachial index is normal. Entire LEA system including new stents are patent. *See table(s) above for measurements and observations.  Electronically signed by Levora Dredge MD on 01/06/2023 at 4:57:36 PM.    Final    VAS [place] LOWER EXTREMITY ARTERIAL DUPLEX  Result Date: 01/06/2023 LOWER EXTREMITY ARTERIAL DUPLEX STUDY Patient Name:  Melanie Browning  Date of Exam:   01/06/2023 Medical Rec #: [id number]      Accession #:    [id number] Date of Birth: 04-Oct-1940       Patient Gender: F Patient Age:   82 years Exam Location:  [facility] Procedure:      VAS [place] LOWER EXTREMITY ARTERIAL DUPLEX Referring Phys: Levora Dredge --------------------------------------------------------------------------------   Vascular Interventions: 12/27/2022 Percutaneous transluminal angioplasty and                         stent placement left superficial femoral and popliteal                         arteries to 6 mm                         4. Percutaneous transluminal angioplasty left posterior                         tibial and tibioperoneal trunk to 3.5 mm  Rate and Rhythm: Normal rate and regular rhythm.     Heart sounds: Normal heart sounds.  Pulmonary:     Effort: Pulmonary effort is normal. No respiratory distress.     Breath sounds: No wheezing.  Abdominal:     General: Bowel sounds are normal. There is no distension.     Palpations: Abdomen is soft.  Musculoskeletal:        General: No deformity. Normal range of motion.     Cervical back: Normal range of motion and neck supple.  Skin:    General: Skin is warm and dry.     Findings: No erythema or rash.  Neurological:     Mental Status: She is alert and oriented to person, place, and time. Mental status is at baseline.     Cranial Nerves: No cranial nerve deficit.     Coordination: Coordination normal.  Psychiatric:        Mood and Affect: Mood normal.      LABORATORY DATA:  I have reviewed the data as listed    Latest Ref Rng & Units 01/03/2023   11:42 AM 04/11/2021    7:00 AM  CBC  WBC 4.0 - 10.5 K/uL 6.5  6.5   Hemoglobin 12.0 - 15.0 g/dL 16.1  09.6   Hematocrit 36.0 - 46.0 % 31.0  36.6   Platelets 150 - 400 K/uL 178  167       Latest Ref Rng & Units 12/27/2022    7:31 AM 07/23/2022    8:10 AM 06/05/2022   10:57 AM  CMP  BUN 8 - 23 mg/dL 24  19  17    Creatinine 0.44 - 1.00 mg/dL 0.45  4.09  8.11    No results found for: "IRON", "TIBC", "FERRITIN", "IRONPCTSAT"   RADIOGRAPHIC STUDIES: I have personally reviewed the radiological images as listed and agreed with the findings in the report. VAS [place] ABI WITH/WO TBI  Result Date: 01/06/2023  LOWER EXTREMITY DOPPLER STUDY Patient Name:  Melanie Browning  Date of Exam:   01/06/2023 Medical Rec #: [id number]      Accession #:     [id number] Date of Birth: January 02, 1941       Patient Gender: F Patient Age:   69 years Exam Location:  [facility] Procedure:      VAS [place] ABI WITH/WO TBI Referring Phys: --------------------------------------------------------------------------------  Indications: Peripheral artery disease.  Vascular Interventions: 12/27/2022 Percutaneous transluminal angioplasty and                         stent placement left superficial femoral and popliteal                         arteries to 6 mm                         4. Percutaneous transluminal angioplasty left posterior                         tibial and tibioperoneal trunk to 3.5 mm                         5. Atherectomy of the superficial femoral artery and                         popliteal artery using the  ATA      158               1.13 triphasic         +---------+------------------+-----+---------+--------+ PTA      162               1.16 triphasic         +---------+------------------+-----+---------+--------+ Great Toe133               0.95 Normal            +---------+------------------+-----+---------+--------+ +---------+------------------+-----+---------+-------+ Left     Lt Pressure (mmHg)IndexWaveform Comment +---------+------------------+-----+---------+-------+ Brachial 140                                     +---------+------------------+-----+---------+-------+ ATA      170               1.21 triphasic        +---------+------------------+-----+---------+-------+ PTA      173               1.24 triphasic        +---------+------------------+-----+---------+-------+ Great Toe165               1.18                  +---------+------------------+-----+---------+-------+ +-------+-----------+-----------+------------+------------+ ABI/TBIToday's ABIToday's TBIPrevious ABIPrevious TBI  +-------+-----------+-----------+------------+------------+ Right  1.16       .95        1.12        1.02         +-------+-----------+-----------+------------+------------+ Left   1.24       1.18       1.07        1.05         +-------+-----------+-----------+------------+------------+  Summary: Right: Resting right ankle-brachial index is within normal range. The right toe-brachial index is normal. Left: Resting left ankle-brachial index is within normal range. The left toe-brachial index is normal. Entire LEA system including new stents are patent. *See table(s) above for measurements and observations.  Electronically signed by Levora Dredge MD on 01/06/2023 at 4:57:36 PM.    Final    VAS [place] LOWER EXTREMITY ARTERIAL DUPLEX  Result Date: 01/06/2023 LOWER EXTREMITY ARTERIAL DUPLEX STUDY Patient Name:  Melanie Browning  Date of Exam:   01/06/2023 Medical Rec #: [id number]      Accession #:    [id number] Date of Birth: 04-Oct-1940       Patient Gender: F Patient Age:   82 years Exam Location:  [facility] Procedure:      VAS [place] LOWER EXTREMITY ARTERIAL DUPLEX Referring Phys: Levora Dredge --------------------------------------------------------------------------------   Vascular Interventions: 12/27/2022 Percutaneous transluminal angioplasty and                         stent placement left superficial femoral and popliteal                         arteries to 6 mm                         4. Percutaneous transluminal angioplasty left posterior                         tibial and tibioperoneal trunk to 3.5 mm  Rate and Rhythm: Normal rate and regular rhythm.     Heart sounds: Normal heart sounds.  Pulmonary:     Effort: Pulmonary effort is normal. No respiratory distress.     Breath sounds: No wheezing.  Abdominal:     General: Bowel sounds are normal. There is no distension.     Palpations: Abdomen is soft.  Musculoskeletal:        General: No deformity. Normal range of motion.     Cervical back: Normal range of motion and neck supple.  Skin:    General: Skin is warm and dry.     Findings: No erythema or rash.  Neurological:     Mental Status: She is alert and oriented to person, place, and time. Mental status is at baseline.     Cranial Nerves: No cranial nerve deficit.     Coordination: Coordination normal.  Psychiatric:        Mood and Affect: Mood normal.      LABORATORY DATA:  I have reviewed the data as listed    Latest Ref Rng & Units 01/03/2023   11:42 AM 04/11/2021    7:00 AM  CBC  WBC 4.0 - 10.5 K/uL 6.5  6.5   Hemoglobin 12.0 - 15.0 g/dL 16.1  09.6   Hematocrit 36.0 - 46.0 % 31.0  36.6   Platelets 150 - 400 K/uL 178  167       Latest Ref Rng & Units 12/27/2022    7:31 AM 07/23/2022    8:10 AM 06/05/2022   10:57 AM  CMP  BUN 8 - 23 mg/dL 24  19  17    Creatinine 0.44 - 1.00 mg/dL 0.45  4.09  8.11    No results found for: "IRON", "TIBC", "FERRITIN", "IRONPCTSAT"   RADIOGRAPHIC STUDIES: I have personally reviewed the radiological images as listed and agreed with the findings in the report. VAS [place] ABI WITH/WO TBI  Result Date: 01/06/2023  LOWER EXTREMITY DOPPLER STUDY Patient Name:  Melanie Browning  Date of Exam:   01/06/2023 Medical Rec #: [id number]      Accession #:     [id number] Date of Birth: January 02, 1941       Patient Gender: F Patient Age:   69 years Exam Location:  [facility] Procedure:      VAS [place] ABI WITH/WO TBI Referring Phys: --------------------------------------------------------------------------------  Indications: Peripheral artery disease.  Vascular Interventions: 12/27/2022 Percutaneous transluminal angioplasty and                         stent placement left superficial femoral and popliteal                         arteries to 6 mm                         4. Percutaneous transluminal angioplasty left posterior                         tibial and tibioperoneal trunk to 3.5 mm                         5. Atherectomy of the superficial femoral artery and                         popliteal artery using the

## 2023-01-31 NOTE — Assessment & Plan Note (Addendum)
Anemia Hb10 g/dL. Labs are reviewed and discussed with patient. Negative multiple myeloma panel, normal light chain ratio,negative  flow cytometry, normal LDH, normal haptoglobin Possibly due to suppression effects from alcohol, quinine. Other DDX includes MDS I discussed about the possibility of bone marrow biopsy if she develops progressive cytopenia.

## 2023-02-14 ENCOUNTER — Other Ambulatory Visit: Payer: Self-pay | Admitting: Cardiovascular Disease

## 2023-02-14 NOTE — Telephone Encounter (Signed)
Please advise if OK to refill or defer to PCP. Thank you! 

## 2023-02-25 ENCOUNTER — Other Ambulatory Visit (INDEPENDENT_AMBULATORY_CARE_PROVIDER_SITE_OTHER): Payer: Self-pay | Admitting: Vascular Surgery

## 2023-02-26 ENCOUNTER — Encounter (INDEPENDENT_AMBULATORY_CARE_PROVIDER_SITE_OTHER): Payer: Self-pay | Admitting: Vascular Surgery

## 2023-02-26 ENCOUNTER — Encounter: Payer: Self-pay | Admitting: Cardiovascular Disease

## 2023-02-27 ENCOUNTER — Encounter (INDEPENDENT_AMBULATORY_CARE_PROVIDER_SITE_OTHER): Payer: Self-pay | Admitting: Vascular Surgery

## 2023-02-27 NOTE — Telephone Encounter (Signed)
Just an update.

## 2023-02-28 NOTE — Telephone Encounter (Signed)
She has some additional questions

## 2023-03-03 ENCOUNTER — Ambulatory Visit (INDEPENDENT_AMBULATORY_CARE_PROVIDER_SITE_OTHER): Payer: Medicare Other | Admitting: Vascular Surgery

## 2023-03-03 ENCOUNTER — Encounter (INDEPENDENT_AMBULATORY_CARE_PROVIDER_SITE_OTHER): Payer: Medicare Other

## 2023-03-03 ENCOUNTER — Encounter (INDEPENDENT_AMBULATORY_CARE_PROVIDER_SITE_OTHER): Payer: Self-pay | Admitting: Vascular Surgery

## 2023-03-19 ENCOUNTER — Inpatient Hospital Stay: Payer: Medicare Other | Attending: Oncology | Admitting: Obstetrics and Gynecology

## 2023-03-19 ENCOUNTER — Ambulatory Visit: Payer: Medicare Other

## 2023-03-19 VITALS — BP 141/64 | HR 75 | Temp 97.6°F | Resp 20 | Wt 147.0 lb

## 2023-03-19 DIAGNOSIS — I872 Venous insufficiency (chronic) (peripheral): Secondary | ICD-10-CM | POA: Diagnosis not present

## 2023-03-19 DIAGNOSIS — D539 Nutritional anemia, unspecified: Secondary | ICD-10-CM | POA: Insufficient documentation

## 2023-03-19 DIAGNOSIS — Z809 Family history of malignant neoplasm, unspecified: Secondary | ICD-10-CM | POA: Insufficient documentation

## 2023-03-19 DIAGNOSIS — Z79899 Other long term (current) drug therapy: Secondary | ICD-10-CM | POA: Insufficient documentation

## 2023-03-19 DIAGNOSIS — Z85828 Personal history of other malignant neoplasm of skin: Secondary | ICD-10-CM | POA: Diagnosis not present

## 2023-03-19 DIAGNOSIS — R7401 Elevation of levels of liver transaminase levels: Secondary | ICD-10-CM | POA: Insufficient documentation

## 2023-03-19 DIAGNOSIS — N903 Dysplasia of vulva, unspecified: Secondary | ICD-10-CM | POA: Diagnosis not present

## 2023-03-19 DIAGNOSIS — Z90710 Acquired absence of both cervix and uterus: Secondary | ICD-10-CM | POA: Diagnosis not present

## 2023-03-19 DIAGNOSIS — Z7901 Long term (current) use of anticoagulants: Secondary | ICD-10-CM | POA: Diagnosis not present

## 2023-03-19 DIAGNOSIS — E034 Atrophy of thyroid (acquired): Secondary | ICD-10-CM | POA: Insufficient documentation

## 2023-03-19 DIAGNOSIS — N952 Postmenopausal atrophic vaginitis: Secondary | ICD-10-CM | POA: Diagnosis not present

## 2023-03-19 DIAGNOSIS — Z7902 Long term (current) use of antithrombotics/antiplatelets: Secondary | ICD-10-CM | POA: Diagnosis not present

## 2023-03-19 DIAGNOSIS — Z800 Family history of malignant neoplasm of digestive organs: Secondary | ICD-10-CM | POA: Insufficient documentation

## 2023-03-19 DIAGNOSIS — N893 Dysplasia of vagina, unspecified: Secondary | ICD-10-CM

## 2023-03-19 DIAGNOSIS — I2510 Atherosclerotic heart disease of native coronary artery without angina pectoris: Secondary | ICD-10-CM | POA: Diagnosis not present

## 2023-03-19 DIAGNOSIS — Z90722 Acquired absence of ovaries, bilateral: Secondary | ICD-10-CM | POA: Insufficient documentation

## 2023-03-19 DIAGNOSIS — Z8249 Family history of ischemic heart disease and other diseases of the circulatory system: Secondary | ICD-10-CM | POA: Diagnosis not present

## 2023-03-19 DIAGNOSIS — E785 Hyperlipidemia, unspecified: Secondary | ICD-10-CM | POA: Diagnosis not present

## 2023-03-19 NOTE — Addendum Note (Signed)
Addended by: Alinda Dooms on: 03/19/2023 02:26 PM   Modules accepted: Orders

## 2023-03-19 NOTE — Progress Notes (Signed)
GYN ONC Interval [facility]  Telephone:([phone] [phone]  Patient Care Team: Mick Sell, MD as PCP - General (Infectious Diseases) Benita Gutter, RN as Oncology Nurse Navigator   Name of the patient: Melanie Browning  [id number]  [birth year]   Date of visit: 03/19/2023  Gynecologic Oncology Interval Visit   Referring Provider: Dr. 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 She is s/p TAH-BSO on 09/05/2015, but final surgical path was negative for dysplasia.  After surgery, she was closely monitored every 6 months with colposcopies and PAPs.   05/08/2022 HGSIL vaginal pap; HRHPV positive. She using the estrogen vaginal therapy twice a week since recommended in August.  Follow up exam, PAP, and colposcopy vaginal biopsy 12/23 showed LSIL/VAIN1.    Returns today for repeat exam with Pap. No new complaints. Still using vaginal estrogen twice a week.   Gynecologic Oncology History Melanie Browning is a pleasant G1P1 female who is seen in consultation from Dr. Sampson Goon for dysplasia. She is s/p TAH-BSO on 09/05/2015, but final surgical path was negative for dysplasia.  After surgery, she was closely monitored every 6 months with colposcopies and PAPs. Prior treatment in [place] for cervical and vaginal dysplasia from 2016 to 08/2019.   9/16 LSIL PAP, HR HPV+, Colposcopic biopsies 10/16, CIN1 on ectocervix, ECC negative. 11/16 [facility] biopsy LSIL, clear margins. ECC negative.  09/05/2015 TLH/BSO Dr Rowan Blase in [facility] for abnormal PAP. No residual disease in specimen. PAP 08/2016 LGSIL  PAP 02/09/2017 HSIL Colposcopy 02/2017 VAIN 1 PAP 05/2017 NILM 10/30/2017 ASCUS, HPV 16+ Colposcopy 11/26/2017 VAIN 1 Colposcopy 03/17/2018 No obvious disease noted (as per note from Dr. Pernell Dupre on 09/28/2019)  PAP 03/25/2019  LSIL/HPV+ Colposcopy with biopsy 04/20/2019 VAIN 1  PAP 09/21/2019, patient reports VAIN 1, HPV+ Colposcopy 7/21 normal PAP 10/25/20 HSIL, +HR HPV PAP 10/25/20 HSIL, -HR HPV  PAP 10/25/20 HSIL, +HR HPV Reported no gyn symptoms.   Colposcopy 2/22 negative We discussed options for management including surveillance versus Efudex treatment.  She was anxious and preferred treatment.  We prescribed Efudex once a week for 10 weeks and then RTC in 1-2 months for repeat PAP. She completed this course with some interruptions due to vulvar irritation despite skin protectants.   PAP 02/28/21 HSIL, HR HPV- No bleeding or discharge, slight odor.  Repeat Pap 04/18/21- ASC-H; cannot exclude high grade squamous intraepithelial.   Recommended she use Estradiol cream to improve health of vaginal mucosa prior to repeat colposcopy.    06/06/2021 Biopsy to evaluate abnormal Pap  DIAGNOSIS:  A. VAGINA; BIOPSY:  - MINUTE FRAGMENT OF VAGINAL MUCOSA WITH PARTIALLY DETACHED EPITHELIUM  AND AT LEAST LOW-GRADE SQUAMOUS INTRAEPITHELIAL LESION (VAIN 1, MILD  DYSPLASIA).  - NEGATIVE FOR DEFINITE HIGH-GRADE SQUAMOUS INTRAEPITHELIAL LESION AND MALIGNANCY.   12/05/21 PAP HSIL/HPV+ Colposcopy 3/23 with lesion in left fornix, but path did not show significant lesion.   VAGINAL FORNIX, LEFT; BIOPSY:  - 1 FRAGMENT OF ATYPICAL SQUAMOUS MUCOSA ASSOCIATED WITH POSSIBLE  PREVIOUS BIOPSY SITE REACTION.  - SEPARATE FRAGMENT OF ATROPHIC VAGINAL MUCOSA.  Other pertinent medical issues She had stent placed a few days ago in her leg due to artery narrowing.  On Plavix and ASA.  Small blood blister below right groin where vascular access was obtained. Bruises easily, but no bleeding.   She had colonoscopy for follow up of  Lymphocytic colitis and that was stable.   Also having problems with intermittent claudication in left leg and underwent vascular percutaneous procedures including a popliteal stent in 12/21.  She is taking Plavix and  ASA.   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   Muscle spasms of neck 04/06/2022   Cervical spondylosis 04/06/2022   Acute pain of left wrist 02/14/2022   Atherosclerosis of native arteries of extremity with rest pain (HCC) 11/20/2021   CAD (coronary artery disease) 12/10/2020   Coronary artery disease involving native coronary artery of native heart 11/30/2020   S/P drug eluting coronary stent placement 11/15/2020   Abdominal pain, LLQ (left lower quadrant) 09/28/2020   Long term current use of anticoagulant therapy 09/28/2020   Functional diarrhea 09/28/2020   Chronic venous insufficiency 04/23/2020   PAD (peripheral artery disease) (HCC) 04/19/2020   Hyperlipidemia 04/19/2020   Hypothyroidism 04/19/2020   Popliteal artery injury, left, 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   Disruption of external operation (surgical) wound, not elsewhere classified, initial encounter 11/09/2019   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) 10/29/2019   Stricture, artery (HCC) 10/29/2019   Critical lower limb ischemia (HCC) 10/13/2019   Hypothyroidism due to acquired atrophy of thyroid 04/07/2019   Syncope and collapse 04/07/2019   Senile osteoporosis 09/20/2016   Sleep disorder 07/18/2016   IGT (impaired glucose tolerance) 06/26/2015   Pap smear abnormality of cervix with ASCUS favoring dysplasia 06/26/2015   Family history of Guillain-Barre syndrome 04/26/2015   History of herpes genitalis 04/26/2015   History of HPV infection 04/26/2015   Dyslipidemia 04/17/2015   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   PMR (polymyalgia rheumatica) (HCC) 04/17/2015   Arthritis of shoulder region, 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   Past Medical History: Past Medical History:   Diagnosis Date   Arthritis    Colitis    Dry eye    bilateral when living in [facility]   History of cervical cancer    had hysterectomy   HPV (human papilloma virus) infection    Hyperlipidemia    Ischemia of left lower extremity    Osteoporosis    Polymyalgia rheumatica (HCC)    Skin cancer    nose   Thyroid disease    Vaginal dysplasia    Past Surgical History: Past Surgical History:  Procedure Laterality Date   ABDOMINAL HYSTERECTOMY     complete   COLONOSCOPY WITH PROPOFOL N/A 10/04/2020   Procedure: COLONOSCOPY WITH PROPOFOL;  Surgeon: Toledo, Boykin Nearing, MD;  Location: [facility];  Service: Gastroenterology;  Laterality: N/A;   colposcobpy     COLPOSCOPY     CORONARY STENT INTERVENTION N/A 11/15/2020   Procedure: CORONARY STENT INTERVENTION;  Surgeon: Alwyn Pea, MD;  Location: [facility];  Service: Cardiovascular;  Laterality: N/A;   knee athroscopy Left    LAPAROSCOPIC HYSTERECTOMY     LEFT HEART CATH AND CORONARY ANGIOGRAPHY N/A 11/15/2020   Procedure: LEFT HEART CATH AND CORONARY ANGIOGRAPHY;  Surgeon: Alwyn Pea, MD;  Location: [facility];  Service: Cardiovascular;  Laterality: N/A;   LOWER EXTREMITY ANGIOGRAPHY Left 09/05/2020   Procedure: LOWER EXTREMITY ANGIOGRAPHY;  Surgeon: Renford Dills, MD;  Location: [facility];  Service: Cardiovascular;  Laterality: Left;   LOWER EXTREMITY ANGIOGRAPHY  Left 04/10/2021   Procedure: LOWER EXTREMITY ANGIOGRAPHY;  Surgeon: Renford Dills, MD;  Location: [facility];  Service: Cardiovascular;  Laterality: Left;   LOWER EXTREMITY ANGIOGRAPHY Left 11/20/2021   Procedure: Lower Extremity Angiography;  Surgeon: Renford Dills, MD;  Location: [facility];  Service: Cardiovascular;  Laterality: Left;   LOWER EXTREMITY ANGIOGRAPHY Left 06/05/2022   Procedure: Lower Extremity Angiography;  Surgeon: Renford Dills, MD;  Location: [facility];  Service:  Cardiovascular;  Laterality: Left;   LOWER EXTREMITY ANGIOGRAPHY Left 07/23/2022   Procedure: Lower Extremity Angiography;  Surgeon: Renford Dills, MD;  Location: [facility];  Service: Cardiovascular;  Laterality: Left;   LOWER EXTREMITY ANGIOGRAPHY Left 12/27/2022   Procedure: Lower Extremity Angiography;  Surgeon: Renford Dills, MD;  Location: [facility];  Service: Cardiovascular;  Laterality: Left;   Past Gynecologic History:  Menarche: 14 Menstrual details: Lasts 3 days Menses regular: yes Last Menstrual Period: Unknown History of OCP/HRT use: No History of Abnormal pap: yes,  as per HPI Last pap:  08/2019 History of STDs: The patient reports a past history of: herpes. Sexually active: not asked  OB History:  OB History  No obstetric history on file.   Family History: Family History  Problem Relation Age of Onset   Cancer Mother    Pancreatic cancer Mother    Heart failure Mother    Heart attack Father 58   Heart disease Father    Social History: Social History   Socioeconomic History   Marital status: Married    Spouse name: Norm    Number of children: 1   Years of education: Not on file   Highest education level: Not on file  Occupational History   Occupation: retired   Tobacco Use   Smoking status: Never   Smokeless tobacco: Never  Vaping Use   Vaping Use: Never used  Substance and Sexual Activity   Alcohol use: Yes    Alcohol/week: 1.0 standard drink of alcohol    Types: 1 Glasses of wine per week   Drug use: Never   Sexual activity: Yes  Other Topics Concern   Not on file  Social History Narrative   L[facility] with [facility]    Social Determinants of Health   Financial Resource Strain: Not on file  Food Insecurity: No Food Insecurity (01/03/2023)   Hunger Vital Sign    Worried About Running Out of Food in the Last Year: Never true    Ran Out of Food in the Last Year: Never true  Transportation Needs: No  Transportation Needs (01/03/2023)   PRAPARE - Administrator, Civil Service (Medical): No    Lack of Transportation (Non-Medical): No  Physical Activity: Not on file  Stress: Not on file  Social Connections: Not on file  Intimate Partner Violence: Not At Risk (01/03/2023)   Humiliation, Afraid, Rape, and Kick questionnaire    Fear of Current or Ex-Partner: No    Emotionally Abused: No    Physically Abused: No    Sexually Abused: No    Allergies: Allergies  Allergen Reactions   Lactose Other (See Comments)    Gi upset and diarrhea  Other reaction(s): Other (See Comments) Gi upset and diarrhea Gi upset and diarrhea Gi upset and diarrhea    Current Medications: Current Outpatient Medications  Medication Sig Dispense Refill   aspirin EC 81 MG tablet Take 81 mg by mouth every  evening. Swallow whole.     estradiol (ESTRACE) 0.1 MG/GM vaginal cream Insert 1/4 applicator full into vagina at night twice a week 42.5 g 2   levothyroxine (SYNTHROID) 75 MCG tablet Take 75 mcg by mouth daily before breakfast.      losartan (COZAAR) 25 MG tablet Take 25 mg by mouth daily.     ticagrelor (BRILINTA) 90 MG TABS tablet Take 1 tablet (90 mg total) by mouth 2 (two) times daily. 180 tablet 0   atorvastatin (LIPITOR) 40 MG tablet Take 40 mg by mouth every evening.  (Patient not taking: Reported on 01/03/2023)     budesonide (ENTOCORT EC) 3 MG 24 hr capsule Take 9 mg by mouth daily. (Patient not taking: Reported on 01/03/2023)     citalopram (CELEXA) 10 MG tablet Take 10 mg by mouth daily. (Patient not taking: Reported on 03/19/2023)     diphenoxylate-atropine (LOMOTIL) 2.5-0.025 MG tablet TAKE 1 TABLET BY MOUTH 3 TIMES DAILY AS NEEDED FOR DIARRHEA. (Patient not taking: Reported on 12/02/2022) 90 tablet 0   ezetimibe (ZETIA) 10 MG tablet Take 1 tablet (10 mg total) by mouth daily. (Patient not taking: Reported on 01/03/2023) 90 tablet 3   HYDROcodone-acetaminophen (NORCO) 5-325 MG tablet Take 1-2  tablets by mouth every 6 (six) hours as needed for moderate pain or severe pain. (Patient not taking: Reported on 01/31/2023) 50 tablet 0   nitroGLYCERIN (NITROSTAT) 0.4 MG SL tablet Place under the tongue. (Patient not taking: Reported on 01/03/2023)     No current facility-administered medications for this visit.    Review of Systems General: no complaints  HEENT: no complaints  Lungs: no complaints  Cardiac: no complaints  GI: no complaints  GU: no complaints  Musculoskeletal: no complaints  Extremities: no complaints  Skin: no complaints  Neuro: no complaints  Endocrine: no complaints  Psych: no complaints        Objective:  Physical Examination:  BP (!) 141/64   Pulse 75   Temp 97.6 F (36.4 C)   Resp 20   Wt 147 lb (66.7 kg)   SpO2 100%   BMI 22.35 kg/m    ECOG Performance Status: 0 - Asymptomatic  GENERAL: Patient is a well appearing female in no acute distress HEENT: Atraumatic normocephalic NODES:  No inguinal lymphadenopathy palpated.  ABDOMEN:  Soft, nontender, nondistended.   EXTREMITIES:  No peripheral edema.   NEURO:  Nonfocal. Well oriented.  Appropriate affect.  Pelvic: Noted by NP EGBUS: no lesions Cervix: Surgically absent  Vagina: Atrophic and shortened with prominent dimples in fornices.  Pap smear was obtained. No lesions noted.  There was no discharge or active bleeding. Uterus: Surgically absent BME: no palpable masses  Assessment:  Melanie Browning is a 82 y.o. female s/p TAH-BSO 09/05/2015 for dysplasia but no dysplasia in specimen.  Low grade PAP abnormalities since then with colposcopy negative.  PAP 12/20 VAIN1 HPV+.  Moved to this area and first seen 7/21 with normal colposcopy.  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  Colposcopy of vagina 2/22 with no lesions seen.   No symptoms. We discussed options for management including surveillance versus Efudex treatment.  She is anxious.and preferred treatment.  She used  Efudex once a week for 10 weeks with some  interruption for vulvar irritation. PAP 02/28/21 HSIL, HR HPV-. Confirmed on [facility] review.  04/18/21- ASC-H; cannot exclude high grade squamous intraepithelial. On colposcopy 06/06/2021 with Lugol's small area of non staining biopsied, VAIN1. PAP 3/23 showed HSIL/HPV+.  On colposcopy an  area with punctation seen in the left fornix.  Biopsy left fornix 12/26/21 atypia only.  05/08/2022 HGSIL vaginal pap; HRHPV positive.  Colposcopy showed VAIN1.  Returns today for repeat PAP.  No new symptoms.  Using vaginal estrogen twice weekly.   Vaginal atrophy.  Recent arterial stent with vascular for arterial narrowing in right popliteal area. Patient on Plavix and ASA. Has gotten second opinion and may have a vascular bypass procedure.    Medical co-morbidities complicating care: PAD, Dyslipidemia  Plan:   Problem List Items Addressed This Visit       Genitourinary   Vaginal dysplasia - Primary   PAP done.  Continue vaginal estrogen in view of atrophy and abnormal PAPs, which did aid visualization.  If surgery for vaginectomy recommended in the future, may ask Dr Doy Hutching to do this, as UroGyn much more comfortable with this dissection and avoidance of the bladder.  Plan for return in 6 months as long as the PAP today not more concerning.    The patient's diagnosis, an outline of the further diagnostic and laboratory studies which will be required, the recommendation, and alternatives were discussed.  All questions were answered to the patient's satisfaction.   Leida Lauth, MD

## 2023-03-22 LAB — IGP, APTIMA HPV: HPV Aptima: NEGATIVE

## 2023-03-26 ENCOUNTER — Ambulatory Visit: Payer: Medicare Other

## 2023-04-01 ENCOUNTER — Encounter (INDEPENDENT_AMBULATORY_CARE_PROVIDER_SITE_OTHER): Payer: Self-pay | Admitting: Vascular Surgery

## 2023-04-02 ENCOUNTER — Other Ambulatory Visit (INDEPENDENT_AMBULATORY_CARE_PROVIDER_SITE_OTHER): Payer: Self-pay

## 2023-04-02 MED ORDER — TICAGRELOR 90 MG PO TABS: 90.0000 mg | ORAL_TABLET | Freq: Two times a day (BID) | ORAL | 0 refills | Status: DC

## 2023-05-14 ENCOUNTER — Encounter (INDEPENDENT_AMBULATORY_CARE_PROVIDER_SITE_OTHER): Payer: Self-pay | Admitting: Vascular Surgery

## 2023-05-15 ENCOUNTER — Other Ambulatory Visit (INDEPENDENT_AMBULATORY_CARE_PROVIDER_SITE_OTHER): Payer: Self-pay | Admitting: Vascular Surgery

## 2023-05-15 DIAGNOSIS — M79606 Pain in leg, unspecified: Secondary | ICD-10-CM

## 2023-05-15 DIAGNOSIS — I739 Peripheral vascular disease, unspecified: Secondary | ICD-10-CM

## 2023-05-15 NOTE — Telephone Encounter (Signed)
I spoke with pt to see if it was continuous or just while walking she states just while walking.  I let her know Dr Gilda Crease said if it is only when walking she can wait to come in on Monday as scheduled and she states understanding and will call if anything changes today or tomorrow.

## 2023-05-19 ENCOUNTER — Ambulatory Visit (INDEPENDENT_AMBULATORY_CARE_PROVIDER_SITE_OTHER): Payer: Medicare Other | Admitting: Vascular Surgery

## 2023-05-19 ENCOUNTER — Ambulatory Visit (INDEPENDENT_AMBULATORY_CARE_PROVIDER_SITE_OTHER): Payer: Medicare Other

## 2023-05-19 ENCOUNTER — Encounter (INDEPENDENT_AMBULATORY_CARE_PROVIDER_SITE_OTHER): Payer: Self-pay | Admitting: Vascular Surgery

## 2023-05-19 VITALS — BP 142/75 | HR 70 | Resp 16 | Ht 68.0 in | Wt 147.0 lb

## 2023-05-19 DIAGNOSIS — Z9889 Other specified postprocedural states: Secondary | ICD-10-CM

## 2023-05-19 DIAGNOSIS — S85002S Unspecified injury of popliteal artery, left leg, sequela: Secondary | ICD-10-CM | POA: Diagnosis not present

## 2023-05-19 DIAGNOSIS — I70223 Atherosclerosis of native arteries of extremities with rest pain, bilateral legs: Secondary | ICD-10-CM | POA: Diagnosis not present

## 2023-05-19 DIAGNOSIS — I25118 Atherosclerotic heart disease of native coronary artery with other forms of angina pectoris: Secondary | ICD-10-CM | POA: Diagnosis not present

## 2023-05-19 DIAGNOSIS — I739 Peripheral vascular disease, unspecified: Secondary | ICD-10-CM

## 2023-05-19 DIAGNOSIS — E782 Mixed hyperlipidemia: Secondary | ICD-10-CM

## 2023-05-19 DIAGNOSIS — M79606 Pain in leg, unspecified: Secondary | ICD-10-CM | POA: Diagnosis not present

## 2023-05-19 DIAGNOSIS — I872 Venous insufficiency (chronic) (peripheral): Secondary | ICD-10-CM | POA: Diagnosis not present

## 2023-05-19 NOTE — H&P (View-Only) (Signed)
MRN : [id number]  Melanie Browning is a 82 y.o. (06-22-1941) female who presents with chief complaint of check circulation.  History of Present Illness:  The patient returns to the office sooner than expected for followup and review status post angiogram with intervention on 12/27/2022.  Over the past week or so she is began experiencing claudication-like symptoms again in her left leg.  In the past this has heralded a reocclusion of her popliteal intervention.   Her most recent procedure:  1.  Percutaneous transluminal angioplasty and stent placement left superficial femoral and popliteal arteries to 6 mm   2.  Percutaneous transluminal angioplasty left posterior tibial and tibioperoneal trunk to 3.5 mm   3.  Atherectomy of the superficial femoral artery and popliteal artery using the Rota Rex catheter.     4.  Mechanical thrombectomy of the tibioperoneal trunk using the penumbra CAT 6 catheter.     The patient notes over the past couple week her leg has returned and she is now having claudication symptoms just trying to walk the dog.  She is also describing some mild rest pain symptoms.  No documented history of amaurosis fugax or recent TIA symptoms. There are no recent neurological changes noted. No documented history of DVT, PE or superficial thrombophlebitis. The patient denies recent episodes of angina or shortness of breath.   Duplex US of the left lower extremity arterial system today shows the popliteal and distal SFA stents are widely patent.  There appears to be three-vessel runoff with triphasic signals in the anterior tibial   Previous ABI's Rt= 1.16 and Lt= 1.24 (previous ABI's Rt= 1.12 and Lt= 1.07).  Previous vein mapping dated June 20, 2022 demonstrates great saphenous vein is patent it is 6 mm at the saphenofemoral junction.  In the calf it measures 2.3 to 2.6 mm.  No outpatient medications have been  marked as taking for the 05/19/23 encounter (Appointment) with Gilda Crease, Latina Craver, MD.    Past Medical History:  Diagnosis Date   Arthritis    Colitis    Dry eye    bilateral when living in [facility]   History of cervical cancer    had hysterectomy   HPV (human papilloma virus) infection    Hyperlipidemia    Ischemia of left lower extremity    Osteoporosis    Polymyalgia rheumatica (HCC)    Skin cancer    nose   Thyroid disease    Vaginal dysplasia     Past Surgical History:  Procedure Laterality Date   ABDOMINAL HYSTERECTOMY     complete   COLONOSCOPY WITH PROPOFOL N/A 10/04/2020   Procedure: COLONOSCOPY WITH PROPOFOL;  Surgeon: Toledo, Boykin Nearing, MD;  Location: [facility];  Service: Gastroenterology;  Laterality: N/A;   colposcobpy     COLPOSCOPY     CORONARY STENT INTERVENTION N/A 11/15/2020   Procedure: CORONARY STENT INTERVENTION;  Surgeon: Alwyn Pea, MD;  Location: [facility];  Service: Cardiovascular;  Laterality: N/A;   knee athroscopy Left    LAPAROSCOPIC HYSTERECTOMY     LEFT HEART CATH AND CORONARY  ANGIOGRAPHY N/A 11/15/2020   Procedure: LEFT HEART CATH AND CORONARY ANGIOGRAPHY;  Surgeon: Alwyn Pea, MD;  Location: [facility];  Service: Cardiovascular;  Laterality: N/A;   LOWER EXTREMITY ANGIOGRAPHY Left 09/05/2020   Procedure: LOWER EXTREMITY ANGIOGRAPHY;  Surgeon: Renford Dills, MD;  Location: [facility];  Service: Cardiovascular;  Laterality: Left;   LOWER EXTREMITY ANGIOGRAPHY Left 04/10/2021   Procedure: LOWER EXTREMITY ANGIOGRAPHY;  Surgeon: Renford Dills, MD;  Location: [facility];  Service: Cardiovascular;  Laterality: Left;   LOWER EXTREMITY ANGIOGRAPHY Left 11/20/2021   Procedure: Lower Extremity Angiography;  Surgeon: Renford Dills, MD;  Location: [facility];  Service: Cardiovascular;  Laterality: Left;   LOWER EXTREMITY ANGIOGRAPHY Left 06/05/2022   Procedure: Lower Extremity  Angiography;  Surgeon: Renford Dills, MD;  Location: [facility];  Service: Cardiovascular;  Laterality: Left;   LOWER EXTREMITY ANGIOGRAPHY Left 07/23/2022   Procedure: Lower Extremity Angiography;  Surgeon: Renford Dills, MD;  Location: [facility];  Service: Cardiovascular;  Laterality: Left;   LOWER EXTREMITY ANGIOGRAPHY Left 12/27/2022   Procedure: Lower Extremity Angiography;  Surgeon: Renford Dills, MD;  Location: [facility];  Service: Cardiovascular;  Laterality: Left;    Social History Social History   Tobacco Use   Smoking status: Never   Smokeless tobacco: Never  Vaping Use   Vaping status: Never Used  Substance Use Topics   Alcohol use: Yes    Alcohol/week: 1.0 standard drink of alcohol    Types: 1 Glasses of wine per week   Drug use: Never    Family History Family History  Problem Relation Age of Onset   Cancer Mother    Pancreatic cancer Mother    Heart failure Mother    Heart attack Father 1   Heart disease Father     Allergies  Allergen Reactions   Lactose Other (See Comments)    Gi upset and diarrhea  Other reaction(s): Other (See Comments) Gi upset and diarrhea Gi upset and diarrhea Gi upset and diarrhea     REVIEW OF SYSTEMS (Negative unless checked)  Constitutional: [] Weight loss  [] Fever  [] Chills Cardiac: [] Chest pain   [] Chest pressure   [] Palpitations   [] Shortness of breath when laying flat   [] Shortness of breath with exertion. Vascular:  [x] Pain in legs with walking   [] Pain in legs at rest  [] History of DVT   [] Phlebitis   [] Swelling in legs   [] Varicose veins   [] Non-healing ulcers Pulmonary:   [] Uses home oxygen   [] Productive cough   [] Hemoptysis   [] Wheeze  [] COPD   [] Asthma Neurologic:  [] Dizziness   [] Seizures   [] History of stroke   [] History of TIA  [] Aphasia   [] Vissual changes   [] Weakness or numbness in arm   [] Weakness or numbness in leg Musculoskeletal:   [] Joint swelling   [] Joint  pain   [] Low back pain Hematologic:  [] Easy bruising  [] Easy bleeding   [] Hypercoagulable state   [] Anemic Gastrointestinal:  [] Diarrhea   [] Vomiting  [] Gastroesophageal reflux/heartburn   [] Difficulty swallowing. Genitourinary:  [] Chronic kidney disease   [] Difficult urination  [] Frequent urination   [] Blood in urine Skin:  [] Rashes   [] Ulcers  Psychological:  [] History of anxiety   []  History of major depression.  Physical Examination  There were no vitals filed for this visit. There is no height or weight on file to calculate BMI. Gen: WD/WN, NAD Head: [place]/AT,  No temporalis wasting.  Ear/Nose/Throat: Hearing grossly intact, nares w/o erythema or drainage Eyes: PER, EOMI, sclera nonicteric.  Neck: Supple, no masses.  No bruit or JVD.  Pulmonary:  Good air movement, no audible wheezing, no use of accessory muscles.  Cardiac: RRR, normal S1, S2, no Murmurs. Vascular:  mild trophic changes, no open wounds Vessel Right Left  Radial Palpable Palpable  PT Palpable Trace  Palpable  DP Palpable 2+ Palpable  Gastrointestinal: soft, non-distended. No guarding/no peritoneal signs.  Musculoskeletal: M/S 5/5 throughout.  No visible deformity.  Neurologic: CN 2-12 intact. Pain and light touch intact in extremities.  Symmetrical.  Speech is fluent. Motor exam as listed above. Psychiatric: Judgment intact, Mood & affect appropriate for pt's clinical situation. Dermatologic: No rashes or ulcers noted.  No changes consistent with cellulitis.   CBC Lab Results  Component Value Date   WBC 6.5 01/03/2023   HGB 10.0 (L) 01/03/2023   HCT 31.0 (L) 01/03/2023   MCV 101.6 (H) 01/03/2023   PLT 178 01/03/2023    BMET    Component Value Date/Time   BUN 24 (H) 12/27/2022 0731   CREATININE 0.62 12/27/2022 0731   GFRNONAA >60 12/27/2022 0731   CrCl cannot be calculated (Patient's most recent lab result is older than the maximum 21 days allowed.).  COAG No results found for: "INR",  "PROTIME"  Radiology No results found.   Assessment/Plan 1. 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  Noninvasive studies as well as physical examination demonstrate that the reconstruction is still patent.  I do not see evidence of significant restenosis at this time.  No further invasive studies, angiography or surgery at this time The patient should continue walking and begin a more formal exercise program.  The patient should continue antiplatelet therapy and aggressive treatment of the lipid abnormalities  Continued surveillance is indicated as atherosclerosis is likely to progress with time.    Patient should undergo noninvasive studies as ordered. The patient will follow up with me to review the studies.   - VAS [place] LOWER EXTREMITY ARTERIAL DUPLEX; Future - VAS [place] ABI WITH/WO TBI; Future  2. Popliteal artery injury, left, sequela Patient will continue with exercise and surveillance.  At this point there does not appear to be an indication for reintervention. - VAS [place] LOWER EXTREMITY ARTERIAL DUPLEX; Future - VAS [place] ABI WITH/WO TBI; Future  3. Chronic venous insufficiency Recommend:   No surgery or intervention at this point in time.   I have reviewed my discussion with the patient regarding venous insufficiency and why it causes symptoms. I have discussed with the patient the chronic skin changes that accompany venous insufficiency and the long term sequela such as ulceration. Patient will contnue wearing graduated compression stockings on a daily basis, as this has provided excellent control of his edema. The patient will put the stockings on first thing in the morning and removing them in the evening. The patient is reminded not to sleep in the stockings.   In addition, behavioral modification including elevation during the day will be initiated. Exercise  is strongly encouraged.   Previous duplex ultrasound of the lower extremities shows normal deep system, no significant superficial reflux was identified.   Given the patient's good control and lack of any problems regarding the venous insufficiency and lymphedema a lymph pump in not need at this time.    4. Coronary artery disease  of native artery of native heart with stable angina pectoris (HCC) Continue cardiac and antihypertensive medications as already ordered and reviewed, no changes at this time.  Continue statin as ordered and reviewed, no changes at this time  Nitrates PRN for chest pain  5. Mixed hyperlipidemia Continue statin as ordered and reviewed, no changes at this time     Levora Dredge, MD  05/19/2023 8:42 AM

## 2023-05-19 NOTE — Progress Notes (Unsigned)
MRN : [id number]  Melanie Browning is a 82 y.o. (April 22, 1941) female who presents with chief complaint of check circulation.  History of Present Illness:  The patient returns to the office sooner than expected for followup and review status post angiogram with intervention on 12/27/2022.  Over the past week or so she is began experiencing claudication-like symptoms again in her left leg.  In the past this has heralded a reocclusion of her popliteal intervention.   Her most recent procedure:  1.  Percutaneous transluminal angioplasty and stent placement left superficial femoral and popliteal arteries to 6 mm   2.  Percutaneous transluminal angioplasty left posterior tibial and tibioperoneal trunk to 3.5 mm   3.  Atherectomy of the superficial femoral artery and popliteal artery using the Rota Rex catheter.     4.  Mechanical thrombectomy of the tibioperoneal trunk using the penumbra CAT 6 catheter.     The patient notes over the past couple week her leg has returned and she is now having claudication symptoms just trying to walk the dog.  She is also describing some mild rest pain symptoms.  No documented history of amaurosis fugax or recent TIA symptoms. There are no recent neurological changes noted. No documented history of DVT, PE or superficial thrombophlebitis. The patient denies recent episodes of angina or shortness of breath.   Duplex US of the left lower extremity arterial system today shows the popliteal and distal SFA stents are widely patent.  There appears to be three-vessel runoff with triphasic signals in the anterior tibial   Previous ABI's Rt= 1.16 and Lt= 1.24 (previous ABI's Rt= 1.12 and Lt= 1.07).  Previous vein mapping dated June 20, 2022 demonstrates great saphenous vein is patent it is 6 mm at the saphenofemoral junction.  In the calf it measures 2.3 to 2.6 mm.  No outpatient medications have been  marked as taking for the 05/19/23 encounter (Appointment) with Gilda Crease, Latina Craver, MD.    Past Medical History:  Diagnosis Date   Arthritis    Colitis    Dry eye    bilateral when living in [facility]   History of cervical cancer    had hysterectomy   HPV (human papilloma virus) infection    Hyperlipidemia    Ischemia of left lower extremity    Osteoporosis    Polymyalgia rheumatica (HCC)    Skin cancer    nose   Thyroid disease    Vaginal dysplasia     Past Surgical History:  Procedure Laterality Date   ABDOMINAL HYSTERECTOMY     complete   COLONOSCOPY WITH PROPOFOL N/A 10/04/2020   Procedure: COLONOSCOPY WITH PROPOFOL;  Surgeon: Toledo, Boykin Nearing, MD;  Location: [facility];  Service: Gastroenterology;  Laterality: N/A;   colposcobpy     COLPOSCOPY     CORONARY STENT INTERVENTION N/A 11/15/2020   Procedure: CORONARY STENT INTERVENTION;  Surgeon: Alwyn Pea, MD;  Location: [facility];  Service: Cardiovascular;  Laterality: N/A;   knee athroscopy Left    LAPAROSCOPIC HYSTERECTOMY     LEFT HEART CATH AND CORONARY  ANGIOGRAPHY N/A 11/15/2020   Procedure: LEFT HEART CATH AND CORONARY ANGIOGRAPHY;  Surgeon: Alwyn Pea, MD;  Location: [facility];  Service: Cardiovascular;  Laterality: N/A;   LOWER EXTREMITY ANGIOGRAPHY Left 09/05/2020   Procedure: LOWER EXTREMITY ANGIOGRAPHY;  Surgeon: Renford Dills, MD;  Location: [facility];  Service: Cardiovascular;  Laterality: Left;   LOWER EXTREMITY ANGIOGRAPHY Left 04/10/2021   Procedure: LOWER EXTREMITY ANGIOGRAPHY;  Surgeon: Renford Dills, MD;  Location: [facility];  Service: Cardiovascular;  Laterality: Left;   LOWER EXTREMITY ANGIOGRAPHY Left 11/20/2021   Procedure: Lower Extremity Angiography;  Surgeon: Renford Dills, MD;  Location: [facility];  Service: Cardiovascular;  Laterality: Left;   LOWER EXTREMITY ANGIOGRAPHY Left 06/05/2022   Procedure: Lower Extremity  Angiography;  Surgeon: Renford Dills, MD;  Location: [facility];  Service: Cardiovascular;  Laterality: Left;   LOWER EXTREMITY ANGIOGRAPHY Left 07/23/2022   Procedure: Lower Extremity Angiography;  Surgeon: Renford Dills, MD;  Location: [facility];  Service: Cardiovascular;  Laterality: Left;   LOWER EXTREMITY ANGIOGRAPHY Left 12/27/2022   Procedure: Lower Extremity Angiography;  Surgeon: Renford Dills, MD;  Location: [facility];  Service: Cardiovascular;  Laterality: Left;    Social History Social History   Tobacco Use   Smoking status: Never   Smokeless tobacco: Never  Vaping Use   Vaping status: Never Used  Substance Use Topics   Alcohol use: Yes    Alcohol/week: 1.0 standard drink of alcohol    Types: 1 Glasses of wine per week   Drug use: Never    Family History Family History  Problem Relation Age of Onset   Cancer Mother    Pancreatic cancer Mother    Heart failure Mother    Heart attack Father 55   Heart disease Father     Allergies  Allergen Reactions   Lactose Other (See Comments)    Gi upset and diarrhea  Other reaction(s): Other (See Comments) Gi upset and diarrhea Gi upset and diarrhea Gi upset and diarrhea     REVIEW OF SYSTEMS (Negative unless checked)  Constitutional: [] Weight loss  [] Fever  [] Chills Cardiac: [] Chest pain   [] Chest pressure   [] Palpitations   [] Shortness of breath when laying flat   [] Shortness of breath with exertion. Vascular:  [x] Pain in legs with walking   [] Pain in legs at rest  [] History of DVT   [] Phlebitis   [] Swelling in legs   [] Varicose veins   [] Non-healing ulcers Pulmonary:   [] Uses home oxygen   [] Productive cough   [] Hemoptysis   [] Wheeze  [] COPD   [] Asthma Neurologic:  [] Dizziness   [] Seizures   [] History of stroke   [] History of TIA  [] Aphasia   [] Vissual changes   [] Weakness or numbness in arm   [] Weakness or numbness in leg Musculoskeletal:   [] Joint swelling   [] Joint  pain   [] Low back pain Hematologic:  [] Easy bruising  [] Easy bleeding   [] Hypercoagulable state   [] Anemic Gastrointestinal:  [] Diarrhea   [] Vomiting  [] Gastroesophageal reflux/heartburn   [] Difficulty swallowing. Genitourinary:  [] Chronic kidney disease   [] Difficult urination  [] Frequent urination   [] Blood in urine Skin:  [] Rashes   [] Ulcers  Psychological:  [] History of anxiety   []  History of major depression.  Physical Examination  There were no vitals filed for this visit. There is no height or weight on file to calculate BMI. Gen: WD/WN, NAD Head: [place]/AT,  No temporalis wasting.  Ear/Nose/Throat: Hearing grossly intact, nares w/o erythema or drainage Eyes: PER, EOMI, sclera nonicteric.  Neck: Supple, no masses.  No bruit or JVD.  Pulmonary:  Good air movement, no audible wheezing, no use of accessory muscles.  Cardiac: RRR, normal S1, S2, no Murmurs. Vascular:  mild trophic changes, no open wounds Vessel Right Left  Radial Palpable Palpable  PT Palpable Trace  Palpable  DP Palpable 2+ Palpable  Gastrointestinal: soft, non-distended. No guarding/no peritoneal signs.  Musculoskeletal: M/S 5/5 throughout.  No visible deformity.  Neurologic: CN 2-12 intact. Pain and light touch intact in extremities.  Symmetrical.  Speech is fluent. Motor exam as listed above. Psychiatric: Judgment intact, Mood & affect appropriate for pt's clinical situation. Dermatologic: No rashes or ulcers noted.  No changes consistent with cellulitis.   CBC Lab Results  Component Value Date   WBC 6.5 01/03/2023   HGB 10.0 (L) 01/03/2023   HCT 31.0 (L) 01/03/2023   MCV 101.6 (H) 01/03/2023   PLT 178 01/03/2023    BMET    Component Value Date/Time   BUN 24 (H) 12/27/2022 0731   CREATININE 0.62 12/27/2022 0731   GFRNONAA >60 12/27/2022 0731   CrCl cannot be calculated (Patient's most recent lab result is older than the maximum 21 days allowed.).  COAG No results found for: "INR",  "PROTIME"  Radiology No results found.   Assessment/Plan 1. 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  Noninvasive studies as well as physical examination demonstrate that the reconstruction is still patent.  I do not see evidence of significant restenosis at this time.  No further invasive studies, angiography or surgery at this time The patient should continue walking and begin a more formal exercise program.  The patient should continue antiplatelet therapy and aggressive treatment of the lipid abnormalities  Continued surveillance is indicated as atherosclerosis is likely to progress with time.    Patient should undergo noninvasive studies as ordered. The patient will follow up with me to review the studies.   - VAS [place] LOWER EXTREMITY ARTERIAL DUPLEX; Future - VAS [place] ABI WITH/WO TBI; Future  2. Popliteal artery injury, left, sequela Patient will continue with exercise and surveillance.  At this point there does not appear to be an indication for reintervention. - VAS [place] LOWER EXTREMITY ARTERIAL DUPLEX; Future - VAS [place] ABI WITH/WO TBI; Future  3. Chronic venous insufficiency Recommend:   No surgery or intervention at this point in time.   I have reviewed my discussion with the patient regarding venous insufficiency and why it causes symptoms. I have discussed with the patient the chronic skin changes that accompany venous insufficiency and the long term sequela such as ulceration. Patient will contnue wearing graduated compression stockings on a daily basis, as this has provided excellent control of his edema. The patient will put the stockings on first thing in the morning and removing them in the evening. The patient is reminded not to sleep in the stockings.   In addition, behavioral modification including elevation during the day will be initiated. Exercise  is strongly encouraged.   Previous duplex ultrasound of the lower extremities shows normal deep system, no significant superficial reflux was identified.   Given the patient's good control and lack of any problems regarding the venous insufficiency and lymphedema a lymph pump in not need at this time.    4. Coronary artery disease  of native artery of native heart with stable angina pectoris (HCC) Continue cardiac and antihypertensive medications as already ordered and reviewed, no changes at this time.  Continue statin as ordered and reviewed, no changes at this time  Nitrates PRN for chest pain  5. Mixed hyperlipidemia Continue statin as ordered and reviewed, no changes at this time     Levora Dredge, MD  05/19/2023 8:42 AM

## 2023-05-20 ENCOUNTER — Encounter (INDEPENDENT_AMBULATORY_CARE_PROVIDER_SITE_OTHER): Payer: Self-pay | Admitting: Vascular Surgery

## 2023-06-16 ENCOUNTER — Ambulatory Visit (INDEPENDENT_AMBULATORY_CARE_PROVIDER_SITE_OTHER): Payer: Medicare Other | Admitting: Vascular Surgery

## 2023-06-16 ENCOUNTER — Encounter (INDEPENDENT_AMBULATORY_CARE_PROVIDER_SITE_OTHER): Payer: Self-pay | Admitting: Vascular Surgery

## 2023-06-16 ENCOUNTER — Telehealth (INDEPENDENT_AMBULATORY_CARE_PROVIDER_SITE_OTHER): Payer: Self-pay

## 2023-06-16 ENCOUNTER — Other Ambulatory Visit (INDEPENDENT_AMBULATORY_CARE_PROVIDER_SITE_OTHER): Payer: Medicare Other

## 2023-06-16 VITALS — BP 110/69 | HR 92 | Resp 18 | Ht 68.0 in | Wt 147.0 lb

## 2023-06-16 DIAGNOSIS — I872 Venous insufficiency (chronic) (peripheral): Secondary | ICD-10-CM

## 2023-06-16 DIAGNOSIS — S85002S Unspecified injury of popliteal artery, left leg, sequela: Secondary | ICD-10-CM

## 2023-06-16 DIAGNOSIS — I70223 Atherosclerosis of native arteries of extremities with rest pain, bilateral legs: Secondary | ICD-10-CM

## 2023-06-16 DIAGNOSIS — E785 Hyperlipidemia, unspecified: Secondary | ICD-10-CM

## 2023-06-16 DIAGNOSIS — I25118 Atherosclerotic heart disease of native coronary artery with other forms of angina pectoris: Secondary | ICD-10-CM

## 2023-06-16 NOTE — Progress Notes (Unsigned)
Cardiology Office Note  Date:  06/17/2023   ID:  Clarabelle Rattan, DOB 26-Sep-1941, MRN [id number]  PCP:  Mick Sell, MD   Chief Complaint  Patient presents with   6 month follow up     Patient is having a lower extremity angiography with Schneir @ 11:30. Patient c/o shortness of breath with walking into the office this am & left pain with walking. Medications reviewed by the patient verbally.     HPI:  Ms. Sevasti Carper is a 82 y.o.female patient with past medical history of peripheral vascular disease  previous critical limb ischemia requiring intervention  History of stent thrombosis lower extremities Bovine patch on right LE significant coronary disease including PCI and stent to LAD with DES 10/2020 HTN Who presents for follow-up of her shortness of breath  Last seen by myself in clinic January 2024  PV procedure with Dr. Everett Graff March 2024 --Percutaneous transluminal angioplasty and stent placement left superficial femoral and popliteal arteries to 6 mm --Percutaneous transluminal angioplasty left posterior tibial and tibioperoneal trunk to 3.5 mm --Atherectomy of the superficial femoral artery and popliteal artery using the Rota Rex catheter.   --Mechanical thrombectomy of the tibioperoneal trunk using the penumbra CAT 6 catheter.    No recent cholesterol available Total cholesterol 213 measured November 2023 Hemoglobin 10.2 Normal BMP  Has SOB on exertion, also has episodes of chest pain Does exercise, walks 2 dogs Off lipitor and zetia secondary to elevated LFts last year No recent lipid panel since off the medication  Lower extremity arterial Doppler showing occlusion on left Scheduled for angioplasty today Reports eventual plan is for bypass surgery on left likely later October 2024  EKG personally reviewed by myself on todays visit EKG Interpretation Date/Time:  Tuesday June 17 2023 08:51:30 EDT Ventricular Rate:  75 PR Interval:  178 QRS  Duration:  74 QT Interval:  388 QTC Calculation: 433 R Axis:   19  Text Interpretation: Normal sinus rhythm Normal ECG When compared with ECG of 15-Nov-2020 16:26, No significant change was found Confirmed by Julien Nordmann [phone]) on 06/17/2023 9:10:41 AM   Prior cardiac hx reviewed Cardiac CTA ordered 2/22 Severe proximal LAD disease noted Was not having sx at the time  Cardiac catheterization November 15, 2020 Stent placed to proximal LAD  Pharmacologic Myoview April 2023, low risk study, normal perfusion Ejection fraction 64%  maintained on Brilinta aspirin , failed plavix  Echo 4/23 NORMAL LEFT VENTRICULAR SYSTOLIC FUNCTION WITH AN ESTIMATED EF = >55 %  NORMAL RIGHT VENTRICULAR SYSTOLIC FUNCTION  MILD TRICUSPID AND MITRAL VALVE INSUFFICIENCY  TRACE AORTIC VALVE INSUFFICIENCY  MILD AORTIC VALVE STENOSIS   PMH:   has a past medical history of Arthritis, Colitis, Dry eye, History of cervical cancer, HPV (human papilloma virus) infection, Hyperlipidemia, Ischemia of left lower extremity, Osteoporosis, Polymyalgia rheumatica (HCC), Skin cancer, Thyroid disease, and Vaginal dysplasia.  PSH:    Past Surgical History:  Procedure Laterality Date   ABDOMINAL HYSTERECTOMY     complete   COLONOSCOPY WITH PROPOFOL N/A 10/04/2020   Procedure: COLONOSCOPY WITH PROPOFOL;  Surgeon: Toledo, Boykin Nearing, MD;  Location: [facility];  Service: Gastroenterology;  Laterality: N/A;   colposcobpy     COLPOSCOPY     CORONARY STENT INTERVENTION N/A 11/15/2020   Procedure: CORONARY STENT INTERVENTION;  Surgeon: Alwyn Pea, MD;  Location: [facility];  Service: Cardiovascular;  Laterality: N/A;   knee athroscopy Left    LAPAROSCOPIC HYSTERECTOMY     LEFT  HEART CATH AND CORONARY ANGIOGRAPHY N/A 11/15/2020   Procedure: LEFT HEART CATH AND CORONARY ANGIOGRAPHY;  Surgeon: Alwyn Pea, MD;  Location: [facility];  Service: Cardiovascular;  Laterality: N/A;   LOWER  EXTREMITY ANGIOGRAPHY Left 09/05/2020   Procedure: LOWER EXTREMITY ANGIOGRAPHY;  Surgeon: Renford Dills, MD;  Location: [facility];  Service: Cardiovascular;  Laterality: Left;   LOWER EXTREMITY ANGIOGRAPHY Left 04/10/2021   Procedure: LOWER EXTREMITY ANGIOGRAPHY;  Surgeon: Renford Dills, MD;  Location: [facility];  Service: Cardiovascular;  Laterality: Left;   LOWER EXTREMITY ANGIOGRAPHY Left 11/20/2021   Procedure: Lower Extremity Angiography;  Surgeon: Renford Dills, MD;  Location: [facility];  Service: Cardiovascular;  Laterality: Left;   LOWER EXTREMITY ANGIOGRAPHY Left 06/05/2022   Procedure: Lower Extremity Angiography;  Surgeon: Renford Dills, MD;  Location: [facility];  Service: Cardiovascular;  Laterality: Left;   LOWER EXTREMITY ANGIOGRAPHY Left 07/23/2022   Procedure: Lower Extremity Angiography;  Surgeon: Renford Dills, MD;  Location: [facility];  Service: Cardiovascular;  Laterality: Left;   LOWER EXTREMITY ANGIOGRAPHY Left 12/27/2022   Procedure: Lower Extremity Angiography;  Surgeon: Renford Dills, MD;  Location: [facility];  Service: Cardiovascular;  Laterality: Left;    Current Outpatient Medications on File Prior to Visit  Medication Sig Dispense Refill   aspirin EC 81 MG tablet Take 81 mg by mouth every evening. Swallow whole.     B Complex Vitamins (VITAMIN B-COMPLEX) TABS Take 1 tablet by mouth daily.     budesonide (ENTOCORT EC) 3 MG 24 hr capsule Take 9 mg by mouth daily.     diphenoxylate-atropine (LOMOTIL) 2.5-0.025 MG tablet TAKE 1 TABLET BY MOUTH 3 TIMES DAILY AS NEEDED FOR DIARRHEA. 90 tablet 0   estradiol (ESTRACE) 0.1 MG/GM vaginal cream Insert 1/4 applicator full into vagina at night twice a week 42.5 g 2   HYDROcodone-acetaminophen (NORCO) 5-325 MG tablet Take 1-2 tablets by mouth every 6 (six) hours as needed for moderate pain or severe pain. 50 tablet 0   levothyroxine  (SYNTHROID) 75 MCG tablet Take 75 mcg by mouth daily before breakfast.      losartan (COZAAR) 25 MG tablet Take 25 mg by mouth daily.     nitroGLYCERIN (NITROSTAT) 0.4 MG SL tablet Place under the tongue.     ticagrelor (BRILINTA) 90 MG TABS tablet Take 1 tablet (90 mg total) by mouth 2 (two) times daily. 180 tablet 0   atorvastatin (LIPITOR) 40 MG tablet Take 40 mg by mouth every evening.  (Patient not taking: Reported on 01/03/2023)     citalopram (CELEXA) 10 MG tablet Take 10 mg by mouth daily. (Patient not taking: Reported on 03/19/2023)     ezetimibe (ZETIA) 10 MG tablet Take 1 tablet (10 mg total) by mouth daily. (Patient not taking: Reported on 01/03/2023) 90 tablet 3   triamcinolone cream (KENALOG) 0.1 % Apply 1 Application topically 2 (two) times daily. (Patient not taking: Reported on 06/17/2023)     No current facility-administered medications on file prior to visit.     Allergies:   Lactose   Social History:  The patient  reports that she has never smoked. She has never used smokeless tobacco. She reports current alcohol use of about 1.0 standard drink of alcohol per week. She reports that she does not use drugs.   Family History:   family history includes Cancer in her mother; Heart attack (age  of onset: 26) in her father; Heart disease in her father; Heart failure in her mother; Pancreatic cancer in her mother.    Review of Systems: Review of Systems  Constitutional: Negative.   HENT: Negative.    Respiratory: Negative.    Cardiovascular: Negative.   Gastrointestinal: Negative.   Musculoskeletal: Negative.   Neurological: Negative.   Psychiatric/Behavioral: Negative.    All other systems reviewed and are negative.  PHYSICAL EXAM: VS:  BP 110/68 (BP Location: Left Arm, Patient Position: Sitting, Cuff Size: Normal)   Pulse 75   Ht 5\' 8"  (1.727 m)   Wt 150 lb 8 oz (68.3 kg)   SpO2 95%   BMI 22.88 kg/m  , BMI Body mass index is 22.88 kg/m. GEN: Well nourished, well  developed, in no acute distress HEENT: normal Neck: no JVD, carotid bruits, or masses Cardiac: RRR; no murmurs, rubs, or gallops,no edema  Respiratory:  clear to auscultation bilaterally, normal work of breathing GI: soft, nontender, nondistended, + BS MS: no deformity or atrophy Skin: warm and dry, no rash Neuro:  Strength and sensation are intact Psych: euthymic mood, full affect  Recent Labs: 12/27/2022: BUN 24; Creatinine, Ser 0.62 01/03/2023: Hemoglobin 10.0; Platelets 178    Lipid Panel No results found for: "CHOL", "HDL", "LDLCALC", "TRIG"    Wt Readings from Last 3 Encounters:  06/17/23 150 lb 8 oz (68.3 kg)  06/16/23 147 lb (66.7 kg)  05/19/23 147 lb (66.7 kg)     ASSESSMENT AND PLAN:  Problem List Items Addressed This Visit       Cardiology Problems   PAD (peripheral artery disease) (HCC)   Relevant Orders   EKG 12-Lead (Completed)   CAD (coronary artery disease)   Relevant Orders   NM Myocar Multi W/Spect W/Wall Motion / EF   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) - Primary   Relevant Orders   EKG 12-Lead (Completed)   Critical lower limb ischemia (HCC)   Relevant Orders   EKG 12-Lead (Completed)   Hyperlipidemia   Other Visit Diagnoses     Ischemic leg       Relevant Orders   EKG 12-Lead (Completed)   Dyspnea, unspecified type       Relevant Orders   NM Myocar Multi W/Spect W/Wall Motion / EF   ECHOCARDIOGRAM COMPLETE   Angina pectoris (HCC)       Relevant Orders   NM Myocar Multi W/Spect W/Wall Motion / EF   ECHOCARDIOGRAM COMPLETE   Medication management       Relevant Orders   CBC   Hepatic function panel   Lipid panel       Coronary artery disease with stable angina Prior stent to proximal LAD, Reports having some shortness of breath and chest discomfort though does exercise daily Off Lipitor and Zetia secondary to elevated LFTs, we were unaware No recent lipid panel  available On Brilinta for PAD Scheduled for angioplasty today, acceptable risk, no further testing needed prior to procedure later this morning  PAD Lower extremity claudication Prior stenting, surgery Followed by vascular surgery Has angioplasty later this morning She has indicated plan for bypass surgery on left leg late October 2024 On aspirin, Brilinta,  Not taking Zetia Lipitor We did discuss PCSK9 inhibitor if unable to achieve goal LDL less than 70 She prefers to check lipid panel first  Hyperlipidemia Off statins and Zetia last year Suggested she would likely need PCSK9 inhibitor Lipids pending today  Total encounter time more than 40 minutes  Greater than 50% was spent in counseling and coordination of care with the patient    Signed, Dossie Arbour, M.D., Ph.D. [facility] [place], [place] [phone]

## 2023-06-16 NOTE — Telephone Encounter (Signed)
Spoke with the patient and she is scheduled with Dr. Gilda Crease for a left leg angio at the [facility] with a 11:30 am. Pre-procedure instructions were discussed and patient stated she understood and will be sent to Mychart.

## 2023-06-16 NOTE — Progress Notes (Addendum)
MRN : [id number]  Melanie Browning is a 82 y.o. (April 13, 1941) female who presents with chief complaint of check circulation.  History of Present Illness:   The patient returns to the office sooner than expected for the onset of pain in the left foot particularly at night.  She notes this began this past Friday.  The patient notes that there has been a significant deterioration in the left lower extremity symptoms.  The patient notes interval shortening of their claudication distance and development of rest pain symptoms. No new ulcers or wounds have occurred since the last visit.  There have been no significant changes to the patient's overall health care.  The patient denies amaurosis fugax or recent TIA symptoms. There are no recent neurological changes noted. There is no history of DVT, PE or superficial thrombophlebitis. The patient denies recent episodes of angina or shortness of breath.   Duplex US of the lower extremity arterial system shows occlusion of the SFA/Pop stent  Current Meds  Medication Sig   aspirin EC 81 MG tablet Take 81 mg by mouth every evening. Swallow whole.   B Complex Vitamins (VITAMIN B-COMPLEX) TABS Take 1 tablet by mouth daily.   estradiol (ESTRACE) 0.1 MG/GM vaginal cream Insert 1/4 applicator full into vagina at night twice a week   levothyroxine (SYNTHROID) 75 MCG tablet Take 75 mcg by mouth daily before breakfast.    losartan (COZAAR) 25 MG tablet Take 25 mg by mouth daily.   ticagrelor (BRILINTA) 90 MG TABS tablet Take 1 tablet (90 mg total) by mouth 2 (two) times daily.   triamcinolone cream (KENALOG) 0.1 % Apply 1 Application topically 2 (two) times daily.    Past Medical History:  Diagnosis Date   Arthritis    Colitis    Dry eye    bilateral when living in [facility]   History of cervical cancer    had hysterectomy   HPV (human papilloma virus) infection    Hyperlipidemia     Ischemia of left lower extremity    Osteoporosis    Polymyalgia rheumatica (HCC)    Skin cancer    nose   Thyroid disease    Vaginal dysplasia     Past Surgical History:  Procedure Laterality Date   ABDOMINAL HYSTERECTOMY     complete   COLONOSCOPY WITH PROPOFOL N/A 10/04/2020   Procedure: COLONOSCOPY WITH PROPOFOL;  Surgeon: Toledo, Boykin Nearing, MD;  Location: [facility];  Service: Gastroenterology;  Laterality: N/A;   colposcobpy     COLPOSCOPY     CORONARY STENT INTERVENTION N/A 11/15/2020   Procedure: CORONARY STENT INTERVENTION;  Surgeon: Alwyn Pea, MD;  Location: [facility];  Service: Cardiovascular;  Laterality: N/A;   knee athroscopy Left    LAPAROSCOPIC HYSTERECTOMY     LEFT HEART CATH AND CORONARY ANGIOGRAPHY N/A 11/15/2020   Procedure: LEFT HEART CATH AND CORONARY ANGIOGRAPHY;  Surgeon: Alwyn Pea, MD;  Location: [facility];  Service: Cardiovascular;  Laterality: N/A;   LOWER EXTREMITY ANGIOGRAPHY Left 09/05/2020   Procedure: LOWER EXTREMITY ANGIOGRAPHY;  Surgeon: Renford Dills, MD;  Location: [facility];  Service: Cardiovascular;  Laterality: Left;   LOWER EXTREMITY ANGIOGRAPHY Left 04/10/2021   Procedure: LOWER EXTREMITY ANGIOGRAPHY;  Surgeon: Renford Dills, MD;  Location: [facility] INVASIVE CV LAB;  Service: Cardiovascular;  Laterality: Left;   LOWER EXTREMITY ANGIOGRAPHY Left 11/20/2021   Procedure: Lower Extremity Angiography;  Surgeon: Renford Dills, MD;  Location: [facility];  Service: Cardiovascular;  Laterality: Left;   LOWER EXTREMITY ANGIOGRAPHY Left 06/05/2022   Procedure: Lower Extremity Angiography;  Surgeon: Renford Dills, MD;  Location: [facility];  Service: Cardiovascular;  Laterality: Left;   LOWER EXTREMITY ANGIOGRAPHY Left 07/23/2022   Procedure: Lower Extremity Angiography;  Surgeon: Renford Dills, MD;  Location: [facility];  Service: Cardiovascular;  Laterality:  Left;   LOWER EXTREMITY ANGIOGRAPHY Left 12/27/2022   Procedure: Lower Extremity Angiography;  Surgeon: Renford Dills, MD;  Location: [facility] INV[facility] CV [facility];  Service: Cardiovascular;  Laterality: Left;    Social History Social History   Tobacco Use   Smoking status: Never   Smokeless tobacco: Never  Vaping Use   Vaping status: Never Used  Substance Use Topics   Alcohol use: Yes    Alcohol/week: 1.0 standard drink of alcohol    Types: 1 Glasses of wine per week   Drug use: Never    Family History Family History  Problem Relation Age of Onset   Cancer Mother    Pancreatic cancer Mother    Heart failure Mother    Heart attack Father 29   Heart disease Father     Allergies  Allergen Reactions   Lactose Other (See Comments)    Gi upset and diarrhea  Other reaction(s): Other (See Comments) Gi upset and diarrhea Gi upset and diarrhea Gi upset and diarrhea     REVIEW OF SYSTEMS (Negative unless checked)  Constitutional: [] Weight loss  [] Fever  [] Chills Cardiac: [] Chest pain   [] Chest pressure   [] Palpitations   [] Shortness of breath when laying flat   [] Shortness of breath with exertion. Vascular:  [x] Pain in legs with walking   [x] Pain in legs at rest  [] History of DVT   [] Phlebitis   [] Swelling in legs   [] Varicose veins   [] Non-healing ulcers Pulmonary:   [] Uses home oxygen   [] Productive cough   [] Hemoptysis   [] Wheeze  [] COPD   [] Asthma Neurologic:  [] Dizziness   [] Seizures   [] History of stroke   [] History of TIA  [] Aphasia   [] Vissual changes   [] Weakness or numbness in arm   [] Weakness or numbness in leg Musculoskeletal:   [] Joint swelling   [] Joint pain   [] Low back pain Hematologic:  [] Easy bruising  [] Easy bleeding   [] Hypercoagulable state   [] Anemic Gastrointestinal:  [] Diarrhea   [] Vomiting  [] Gastroesophageal reflux/heartburn   [] Difficulty swallowing. Genitourinary:  [] Chronic kidney disease   [] Difficult urination  [] Frequent urination   [] Blood in  urine Skin:  [] Rashes   [] Ulcers  Psychological:  [] History of anxiety   []  History of major depression.  Physical Examination  Vitals:   06/16/23 0955  BP: 110/69  Pulse: 92  Resp: 18  Weight: 147 lb (66.7 kg)  Height: 5\' 8"  (1.727 m)   Body mass index is 22.35 kg/m. Gen: WD/WN, NAD Head: [place]/AT, No temporalis wasting.  Ear/Nose/Throat: Hearing grossly intact, nares w/o erythema or drainage Eyes: PER, EOMI, sclera nonicteric.  Neck: Supple, no masses.  No bruit or JVD.  Pulmonary:  Good air movement, no audible wheezing, no use of accessory muscles.  Cardiac: RRR, normal S1, S2, no Murmurs. Vascular:  mild trophic changes, no open wounds Vessel Right  Left  Radial Palpable Palpable  PT Palpable Not Palpable  DP  Palpable Not Palpable  Gastrointestinal: soft, non-distended. No guarding/no peritoneal signs.  Musculoskeletal: M/S 5/5 throughout.  No visible deformity.  Neurologic: CN 2-12 intact. Pain and light touch intact in extremities.  Symmetrical.  Speech is fluent. Motor exam as listed above. Psychiatric: Judgment intact, Mood & affect appropriate for pt's clinical situation. Dermatologic: No rashes or ulcers noted.  No changes consistent with cellulitis.   CBC Lab Results  Component Value Date   WBC 6.5 01/03/2023   HGB 10.0 (L) 01/03/2023   HCT 31.0 (L) 01/03/2023   MCV 101.6 (H) 01/03/2023   PLT 178 01/03/2023    BMET    Component Value Date/Time   BUN 24 (H) 12/27/2022 0731   CREATININE 0.62 12/27/2022 0731   GFRNONAA >60 12/27/2022 0731   CrCl cannot be calculated (Patient's most recent lab result is older than the maximum 21 days allowed.).  COAG No results found for: "INR", "PROTIME"  Radiology VAS [place] LOWER EXTREMITY ARTERIAL DUPLEX  Result Date: 05/19/2023 LOWER EXTREMITY ARTERIAL DUPLEX STUDY Patient Name:  DANNISHA THREADGILL  Date of Exam:   05/19/2023 Medical Rec #: [id number]      Accession #:    [id number] Date of Birth: May 04, 1941       Patient  Gender: F Patient Age:   63 years Exam Location:  [facility] Procedure:      VAS [place] LOWER EXTREMITY ARTERIAL DUPLEX Referring Phys: Levora Dredge --------------------------------------------------------------------------------  Indications: Peripheral artery disease.  Vascular Interventions: 12/27/2022 Percutaneous transluminal angioplasty and                         stent placement left superficial femoral and popliteal                         arteries to 6 mm                         4. Percutaneous transluminal angioplasty left posterior                         tibial and tibioperoneal trunk to 3.5 mm                         5. Atherectomy of the superficial femoral artery and                         popliteal artery using the Rota Rex catheter.                         6. Mechanical thrombectomy of the tibioperoneal trunk                         using the penumbra CAT 6 catheter.                         10/13/2019: Left popliteal artery PTA;                         10/14/2019: Left popliteal artery reconstruction;                         02/03/2020:  Left popliteal artery angioplasty;                         04/10/2021: Left distal SFA/Popliteal Artery                         thrombectomy/PTA/stent;                          11/20/2021: PTA and Stent placement left SFA and                         Popliteal Artery. PTA Left tibioperoneal trunk and                         Posterior Tibial Artery.                         06/05/2022 Rota Rex mechanical thrombectomy of the left                         SFA and popliteal                         3. Percutaneous transluminal angioplasty and stent                         placement left superficial femoral and popliteal                         arteries to 5 mm maximally                         4. Percutaneous transluminal angioplasty left posterior                         tibial to approximately 3.5 mm. Current ABI:            n/a Comparison Study: 12/2022  Performing Technologist: Salvadore Farber RVT  Examination Guidelines: A complete evaluation includes B-mode imaging, spectral Doppler, color Doppler, and power Doppler as needed of all accessible portions of each vessel. Bilateral testing is considered an integral part of a complete examination. Limited examinations for reoccurring indications may be performed as noted.   +-----------+--------+-----+--------+--------+--------+ LEFT       PSV cm/sRatioStenosisWaveformComments +-----------+--------+-----+--------+--------+--------+ CFA Mid    81                   biphasic         +-----------+--------+-----+--------+--------+--------+ SFA Prox   109                  biphasic         +-----------+--------+-----+--------+--------+--------+ SFA Mid    89                   biphasicstent    +-----------+--------+-----+--------+--------+--------+ SFA Distal 84                   biphasicstent    +-----------+--------+-----+--------+--------+--------+ POP Prox                                stent    +-----------+--------+-----+--------+--------+--------+ POP Distal 57  biphasicstent    +-----------+--------+-----+--------+--------+--------+ TP Trunk   122                  biphasicstent    +-----------+--------+-----+--------+--------+--------+ ATA Distal 92                   biphasic         +-----------+--------+-----+--------+--------+--------+ PTA Distal 21                   biphasic         +-----------+--------+-----+--------+--------+--------+ PERO Distal30                   biphasic         +-----------+--------+-----+--------+--------+--------+  Summary: Left: Patent left LEA system and stents throughout.  See table(s) above for measurements and observations. Electronically signed by Levora Dredge MD on 05/19/2023 at 5:47:13 PM.    Final      Assessment/Plan 1. 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  This represents a limb threatening ischemia and places the patient at the risk for left limb loss.  Patient should undergo angiography of the left lower extremity with the hope for intervention for limb salvage.  The risks and benefits as well as the alternative therapies was discussed in detail with the patient.  All questions were answered.  Patient agrees to proceed with left lower extremity angiography.  The patient will follow up with me in the office after the procedure.   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 Popliteal artery injury, left, sequela Recommend:  The patient has evidence of severe atherosclerotic changes of both lower extremities with rest pain that is associated with preulcerative changes and impending tissue loss of the left foot.  This represents a limb threatening ischemia and places the patient at the risk for left limb loss.  Patient should undergo angiography of the left lower extremity with the hope for intervention for limb salvage.  The risks and benefits as well as the alternative therapies was discussed in detail with the patient.  All questions were answered.  Patient agrees to proceed with left lower extremity angiography.  The patient will follow up with me in the office after the procedure.   3. Chronic venous insufficiency No surgery or intervention at this point in time.   The patient is CEAP [id number]   I have discussed with the patient venous insufficiency and why it  causes symptoms. I have  discussed with the patient the chronic skin changes that accompany venous insufficiency and the long term sequela such as infection and ulceration.  Patient will begin wearing graduated compression stockings or compression wraps on a daily basis.  The patient will put the compression on first thing in the morning and removing them in the evening. The patient is instructed specifically not to sleep in the compression.    In addition, behavioral modification including several periods of elevation of the lower extremities during the day will be continued. I have demonstrated that proper elevation is a  position with the ankles at heart level.  The patient is instructed to begin routine exercise, especially walking on a daily basis  4. Coronary artery disease of native artery of native heart with stable angina pectoris (HCC) Continue cardiac and antihypertensive medications as already ordered and reviewed, no changes at this time.  Continue statin as ordered and reviewed, no changes at this time  Nitrates PRN for chest pain  5. Dyslipidemia Continue statin as ordered and reviewed, no changes at this time    Levora Dredge, MD  06/16/2023 10:10 AM

## 2023-06-16 NOTE — H&P (View-Only) (Signed)
MRN : [id number]  Melanie Browning is a 82 y.o. (10-14-1940) female who presents with chief complaint of check circulation.  History of Present Illness:   The patient returns to the office sooner than expected for the onset of pain in the left foot particularly at night.  She notes this began this past Friday.  The patient notes that there has been a significant deterioration in the left lower extremity symptoms.  The patient notes interval shortening of their claudication distance and development of rest pain symptoms. No new ulcers or wounds have occurred since the last visit.  There have been no significant changes to the patient's overall health care.  The patient denies amaurosis fugax or recent TIA symptoms. There are no recent neurological changes noted. There is no history of DVT, PE or superficial thrombophlebitis. The patient denies recent episodes of angina or shortness of breath.   Duplex US of the lower extremity arterial system shows occlusion of the SFA/Pop stent  Current Meds  Medication Sig   aspirin EC 81 MG tablet Take 81 mg by mouth every evening. Swallow whole.   B Complex Vitamins (VITAMIN B-COMPLEX) TABS Take 1 tablet by mouth daily.   estradiol (ESTRACE) 0.1 MG/GM vaginal cream Insert 1/4 applicator full into vagina at night twice a week   levothyroxine (SYNTHROID) 75 MCG tablet Take 75 mcg by mouth daily before breakfast.    losartan (COZAAR) 25 MG tablet Take 25 mg by mouth daily.   ticagrelor (BRILINTA) 90 MG TABS tablet Take 1 tablet (90 mg total) by mouth 2 (two) times daily.   triamcinolone cream (KENALOG) 0.1 % Apply 1 Application topically 2 (two) times daily.    Past Medical History:  Diagnosis Date   Arthritis    Colitis    Dry eye    bilateral when living in [facility]   History of cervical cancer    had hysterectomy   HPV (human papilloma virus) infection    Hyperlipidemia     Ischemia of left lower extremity    Osteoporosis    Polymyalgia rheumatica (HCC)    Skin cancer    nose   Thyroid disease    Vaginal dysplasia     Past Surgical History:  Procedure Laterality Date   ABDOMINAL HYSTERECTOMY     complete   COLONOSCOPY WITH PROPOFOL N/A 10/04/2020   Procedure: COLONOSCOPY WITH PROPOFOL;  Surgeon: Toledo, Boykin Nearing, MD;  Location: [facility];  Service: Gastroenterology;  Laterality: N/A;   colposcobpy     COLPOSCOPY     CORONARY STENT INTERVENTION N/A 11/15/2020   Procedure: CORONARY STENT INTERVENTION;  Surgeon: Alwyn Pea, MD;  Location: [facility];  Service: Cardiovascular;  Laterality: N/A;   knee athroscopy Left    LAPAROSCOPIC HYSTERECTOMY     LEFT HEART CATH AND CORONARY ANGIOGRAPHY N/A 11/15/2020   Procedure: LEFT HEART CATH AND CORONARY ANGIOGRAPHY;  Surgeon: Alwyn Pea, MD;  Location: [facility];  Service: Cardiovascular;  Laterality: N/A;   LOWER EXTREMITY ANGIOGRAPHY Left 09/05/2020   Procedure: LOWER EXTREMITY ANGIOGRAPHY;  Surgeon: Renford Dills, MD;  Location: [facility];  Service: Cardiovascular;  Laterality: Left;   LOWER EXTREMITY ANGIOGRAPHY Left 04/10/2021   Procedure: LOWER EXTREMITY ANGIOGRAPHY;  MRN : [id number]  Melanie Browning is a 82 y.o. (10-14-1940) female who presents with chief complaint of check circulation.  History of Present Illness:   The patient returns to the office sooner than expected for the onset of pain in the left foot particularly at night.  She notes this began this past Friday.  The patient notes that there has been a significant deterioration in the left lower extremity symptoms.  The patient notes interval shortening of their claudication distance and development of rest pain symptoms. No new ulcers or wounds have occurred since the last visit.  There have been no significant changes to the patient's overall health care.  The patient denies amaurosis fugax or recent TIA symptoms. There are no recent neurological changes noted. There is no history of DVT, PE or superficial thrombophlebitis. The patient denies recent episodes of angina or shortness of breath.   Duplex US of the lower extremity arterial system shows occlusion of the SFA/Pop stent  Current Meds  Medication Sig   aspirin EC 81 MG tablet Take 81 mg by mouth every evening. Swallow whole.   B Complex Vitamins (VITAMIN B-COMPLEX) TABS Take 1 tablet by mouth daily.   estradiol (ESTRACE) 0.1 MG/GM vaginal cream Insert 1/4 applicator full into vagina at night twice a week   levothyroxine (SYNTHROID) 75 MCG tablet Take 75 mcg by mouth daily before breakfast.    losartan (COZAAR) 25 MG tablet Take 25 mg by mouth daily.   ticagrelor (BRILINTA) 90 MG TABS tablet Take 1 tablet (90 mg total) by mouth 2 (two) times daily.   triamcinolone cream (KENALOG) 0.1 % Apply 1 Application topically 2 (two) times daily.    Past Medical History:  Diagnosis Date   Arthritis    Colitis    Dry eye    bilateral when living in [facility]   History of cervical cancer    had hysterectomy   HPV (human papilloma virus) infection    Hyperlipidemia     Ischemia of left lower extremity    Osteoporosis    Polymyalgia rheumatica (HCC)    Skin cancer    nose   Thyroid disease    Vaginal dysplasia     Past Surgical History:  Procedure Laterality Date   ABDOMINAL HYSTERECTOMY     complete   COLONOSCOPY WITH PROPOFOL N/A 10/04/2020   Procedure: COLONOSCOPY WITH PROPOFOL;  Surgeon: Toledo, Boykin Nearing, MD;  Location: [facility];  Service: Gastroenterology;  Laterality: N/A;   colposcobpy     COLPOSCOPY     CORONARY STENT INTERVENTION N/A 11/15/2020   Procedure: CORONARY STENT INTERVENTION;  Surgeon: Alwyn Pea, MD;  Location: [facility];  Service: Cardiovascular;  Laterality: N/A;   knee athroscopy Left    LAPAROSCOPIC HYSTERECTOMY     LEFT HEART CATH AND CORONARY ANGIOGRAPHY N/A 11/15/2020   Procedure: LEFT HEART CATH AND CORONARY ANGIOGRAPHY;  Surgeon: Alwyn Pea, MD;  Location: [facility];  Service: Cardiovascular;  Laterality: N/A;   LOWER EXTREMITY ANGIOGRAPHY Left 09/05/2020   Procedure: LOWER EXTREMITY ANGIOGRAPHY;  Surgeon: Renford Dills, MD;  Location: [facility];  Service: Cardiovascular;  Laterality: Left;   LOWER EXTREMITY ANGIOGRAPHY Left 04/10/2021   Procedure: LOWER EXTREMITY ANGIOGRAPHY;  Left  Radial Palpable Palpable  PT Palpable Not Palpable  DP  Palpable Not Palpable  Gastrointestinal: soft, non-distended. No guarding/no peritoneal signs.  Musculoskeletal: M/S 5/5 throughout.  No visible deformity.  Neurologic: CN 2-12 intact. Pain and light touch intact in extremities.  Symmetrical.  Speech is fluent. Motor exam as listed above. Psychiatric: Judgment intact, Mood & affect appropriate for pt's clinical situation. Dermatologic: No rashes or ulcers noted.  No changes consistent with cellulitis.   CBC Lab Results  Component Value Date   WBC 6.5 01/03/2023   HGB 10.0 (L) 01/03/2023   HCT 31.0 (L) 01/03/2023   MCV 101.6 (H) 01/03/2023   PLT 178 01/03/2023    BMET    Component Value Date/Time   BUN 24 (H) 12/27/2022 0731   CREATININE 0.62 12/27/2022 0731   GFRNONAA >60 12/27/2022 0731   CrCl cannot be calculated (Patient's most recent lab result is older than the maximum 21 days allowed.).  COAG No results found for: "INR", "PROTIME"  Radiology VAS [place] LOWER EXTREMITY ARTERIAL DUPLEX  Result Date: 05/19/2023 LOWER EXTREMITY ARTERIAL DUPLEX STUDY Patient Name:  NEELEY SEDIVY  Date of Exam:   05/19/2023 Medical Rec #: [id number]      Accession #:    [id number] Date of Birth: 1940-12-20       Patient  Gender: F Patient Age:   76 years Exam Location:  [facility] Procedure:      VAS [place] LOWER EXTREMITY ARTERIAL DUPLEX Referring Phys: Levora Dredge --------------------------------------------------------------------------------  Indications: Peripheral artery disease.  Vascular Interventions: 12/27/2022 Percutaneous transluminal angioplasty and                         stent placement left superficial femoral and popliteal                         arteries to 6 mm                         4. Percutaneous transluminal angioplasty left posterior                         tibial and tibioperoneal trunk to 3.5 mm                         5. Atherectomy of the superficial femoral artery and                         popliteal artery using the Rota Rex catheter.                         6. Mechanical thrombectomy of the tibioperoneal trunk                         using the penumbra CAT 6 catheter.                         10/13/2019: Left popliteal artery PTA;                         10/14/2019: Left popliteal artery reconstruction;                         02/03/2020:  MRN : [id number]  Melanie Browning is a 82 y.o. (10-14-1940) female who presents with chief complaint of check circulation.  History of Present Illness:   The patient returns to the office sooner than expected for the onset of pain in the left foot particularly at night.  She notes this began this past Friday.  The patient notes that there has been a significant deterioration in the left lower extremity symptoms.  The patient notes interval shortening of their claudication distance and development of rest pain symptoms. No new ulcers or wounds have occurred since the last visit.  There have been no significant changes to the patient's overall health care.  The patient denies amaurosis fugax or recent TIA symptoms. There are no recent neurological changes noted. There is no history of DVT, PE or superficial thrombophlebitis. The patient denies recent episodes of angina or shortness of breath.   Duplex US of the lower extremity arterial system shows occlusion of the SFA/Pop stent  Current Meds  Medication Sig   aspirin EC 81 MG tablet Take 81 mg by mouth every evening. Swallow whole.   B Complex Vitamins (VITAMIN B-COMPLEX) TABS Take 1 tablet by mouth daily.   estradiol (ESTRACE) 0.1 MG/GM vaginal cream Insert 1/4 applicator full into vagina at night twice a week   levothyroxine (SYNTHROID) 75 MCG tablet Take 75 mcg by mouth daily before breakfast.    losartan (COZAAR) 25 MG tablet Take 25 mg by mouth daily.   ticagrelor (BRILINTA) 90 MG TABS tablet Take 1 tablet (90 mg total) by mouth 2 (two) times daily.   triamcinolone cream (KENALOG) 0.1 % Apply 1 Application topically 2 (two) times daily.    Past Medical History:  Diagnosis Date   Arthritis    Colitis    Dry eye    bilateral when living in [facility]   History of cervical cancer    had hysterectomy   HPV (human papilloma virus) infection    Hyperlipidemia     Ischemia of left lower extremity    Osteoporosis    Polymyalgia rheumatica (HCC)    Skin cancer    nose   Thyroid disease    Vaginal dysplasia     Past Surgical History:  Procedure Laterality Date   ABDOMINAL HYSTERECTOMY     complete   COLONOSCOPY WITH PROPOFOL N/A 10/04/2020   Procedure: COLONOSCOPY WITH PROPOFOL;  Surgeon: Toledo, Boykin Nearing, MD;  Location: [facility];  Service: Gastroenterology;  Laterality: N/A;   colposcobpy     COLPOSCOPY     CORONARY STENT INTERVENTION N/A 11/15/2020   Procedure: CORONARY STENT INTERVENTION;  Surgeon: Alwyn Pea, MD;  Location: [facility];  Service: Cardiovascular;  Laterality: N/A;   knee athroscopy Left    LAPAROSCOPIC HYSTERECTOMY     LEFT HEART CATH AND CORONARY ANGIOGRAPHY N/A 11/15/2020   Procedure: LEFT HEART CATH AND CORONARY ANGIOGRAPHY;  Surgeon: Alwyn Pea, MD;  Location: [facility];  Service: Cardiovascular;  Laterality: N/A;   LOWER EXTREMITY ANGIOGRAPHY Left 09/05/2020   Procedure: LOWER EXTREMITY ANGIOGRAPHY;  Surgeon: Renford Dills, MD;  Location: [facility];  Service: Cardiovascular;  Laterality: Left;   LOWER EXTREMITY ANGIOGRAPHY Left 04/10/2021   Procedure: LOWER EXTREMITY ANGIOGRAPHY;  Left  Radial Palpable Palpable  PT Palpable Not Palpable  DP  Palpable Not Palpable  Gastrointestinal: soft, non-distended. No guarding/no peritoneal signs.  Musculoskeletal: M/S 5/5 throughout.  No visible deformity.  Neurologic: CN 2-12 intact. Pain and light touch intact in extremities.  Symmetrical.  Speech is fluent. Motor exam as listed above. Psychiatric: Judgment intact, Mood & affect appropriate for pt's clinical situation. Dermatologic: No rashes or ulcers noted.  No changes consistent with cellulitis.   CBC Lab Results  Component Value Date   WBC 6.5 01/03/2023   HGB 10.0 (L) 01/03/2023   HCT 31.0 (L) 01/03/2023   MCV 101.6 (H) 01/03/2023   PLT 178 01/03/2023    BMET    Component Value Date/Time   BUN 24 (H) 12/27/2022 0731   CREATININE 0.62 12/27/2022 0731   GFRNONAA >60 12/27/2022 0731   CrCl cannot be calculated (Patient's most recent lab result is older than the maximum 21 days allowed.).  COAG No results found for: "INR", "PROTIME"  Radiology VAS [place] LOWER EXTREMITY ARTERIAL DUPLEX  Result Date: 05/19/2023 LOWER EXTREMITY ARTERIAL DUPLEX STUDY Patient Name:  NEELEY SEDIVY  Date of Exam:   05/19/2023 Medical Rec #: [id number]      Accession #:    [id number] Date of Birth: 1940-12-20       Patient  Gender: F Patient Age:   76 years Exam Location:  [facility] Procedure:      VAS [place] LOWER EXTREMITY ARTERIAL DUPLEX Referring Phys: Levora Dredge --------------------------------------------------------------------------------  Indications: Peripheral artery disease.  Vascular Interventions: 12/27/2022 Percutaneous transluminal angioplasty and                         stent placement left superficial femoral and popliteal                         arteries to 6 mm                         4. Percutaneous transluminal angioplasty left posterior                         tibial and tibioperoneal trunk to 3.5 mm                         5. Atherectomy of the superficial femoral artery and                         popliteal artery using the Rota Rex catheter.                         6. Mechanical thrombectomy of the tibioperoneal trunk                         using the penumbra CAT 6 catheter.                         10/13/2019: Left popliteal artery PTA;                         10/14/2019: Left popliteal artery reconstruction;                         02/03/2020:  Left  Radial Palpable Palpable  PT Palpable Not Palpable  DP  Palpable Not Palpable  Gastrointestinal: soft, non-distended. No guarding/no peritoneal signs.  Musculoskeletal: M/S 5/5 throughout.  No visible deformity.  Neurologic: CN 2-12 intact. Pain and light touch intact in extremities.  Symmetrical.  Speech is fluent. Motor exam as listed above. Psychiatric: Judgment intact, Mood & affect appropriate for pt's clinical situation. Dermatologic: No rashes or ulcers noted.  No changes consistent with cellulitis.   CBC Lab Results  Component Value Date   WBC 6.5 01/03/2023   HGB 10.0 (L) 01/03/2023   HCT 31.0 (L) 01/03/2023   MCV 101.6 (H) 01/03/2023   PLT 178 01/03/2023    BMET    Component Value Date/Time   BUN 24 (H) 12/27/2022 0731   CREATININE 0.62 12/27/2022 0731   GFRNONAA >60 12/27/2022 0731   CrCl cannot be calculated (Patient's most recent lab result is older than the maximum 21 days allowed.).  COAG No results found for: "INR", "PROTIME"  Radiology VAS [place] LOWER EXTREMITY ARTERIAL DUPLEX  Result Date: 05/19/2023 LOWER EXTREMITY ARTERIAL DUPLEX STUDY Patient Name:  NEELEY SEDIVY  Date of Exam:   05/19/2023 Medical Rec #: [id number]      Accession #:    [id number] Date of Birth: 1940-12-20       Patient  Gender: F Patient Age:   76 years Exam Location:  [facility] Procedure:      VAS [place] LOWER EXTREMITY ARTERIAL DUPLEX Referring Phys: Levora Dredge --------------------------------------------------------------------------------  Indications: Peripheral artery disease.  Vascular Interventions: 12/27/2022 Percutaneous transluminal angioplasty and                         stent placement left superficial femoral and popliteal                         arteries to 6 mm                         4. Percutaneous transluminal angioplasty left posterior                         tibial and tibioperoneal trunk to 3.5 mm                         5. Atherectomy of the superficial femoral artery and                         popliteal artery using the Rota Rex catheter.                         6. Mechanical thrombectomy of the tibioperoneal trunk                         using the penumbra CAT 6 catheter.                         10/13/2019: Left popliteal artery PTA;                         10/14/2019: Left popliteal artery reconstruction;                         02/03/2020:

## 2023-06-16 NOTE — Telephone Encounter (Signed)
Pt LVM stating she has a blockage and needs to be seen today by Dr Gilda Crease. There has already been an appt made for pt to see dr Gilda Crease this morning

## 2023-06-17 ENCOUNTER — Ambulatory Visit: Payer: Medicare Other | Attending: Cardiovascular Disease | Admitting: Cardiovascular Disease

## 2023-06-17 ENCOUNTER — Encounter
Admission: RE | Disposition: A | Discharge status: Home / Self Care | Payer: Self-pay | Source: Home / Self Care | Attending: Vascular Surgery

## 2023-06-17 ENCOUNTER — Encounter: Payer: Self-pay | Admitting: Cardiovascular Disease

## 2023-06-17 ENCOUNTER — Encounter: Payer: Self-pay | Admitting: Vascular Surgery

## 2023-06-17 ENCOUNTER — Other Ambulatory Visit: Payer: Self-pay

## 2023-06-17 ENCOUNTER — Observation Stay
Admission: RE | Admit: 2023-06-17 | Discharge: 2023-06-18 | Disposition: A | Discharge status: Home / Self Care | Payer: Medicare Other | Attending: Vascular Surgery | Admitting: Vascular Surgery

## 2023-06-17 VITALS — BP 110/68 | HR 75 | Ht 68.0 in | Wt 150.5 lb

## 2023-06-17 DIAGNOSIS — I70229 Atherosclerosis of native arteries of extremities with rest pain, unspecified extremity: Secondary | ICD-10-CM

## 2023-06-17 DIAGNOSIS — I872 Venous insufficiency (chronic) (peripheral): Secondary | ICD-10-CM | POA: Diagnosis not present

## 2023-06-17 DIAGNOSIS — I209 Angina pectoris, unspecified: Secondary | ICD-10-CM | POA: Diagnosis present

## 2023-06-17 DIAGNOSIS — I998 Other disorder of circulatory system: Secondary | ICD-10-CM | POA: Diagnosis not present

## 2023-06-17 DIAGNOSIS — Z955 Presence of coronary angioplasty implant and graft: Secondary | ICD-10-CM | POA: Insufficient documentation

## 2023-06-17 DIAGNOSIS — T82856A Stenosis of peripheral vascular stent, initial encounter: Secondary | ICD-10-CM

## 2023-06-17 DIAGNOSIS — I70219 Atherosclerosis of native arteries of extremities with intermittent claudication, unspecified extremity: Principal | ICD-10-CM

## 2023-06-17 DIAGNOSIS — E782 Mixed hyperlipidemia: Secondary | ICD-10-CM | POA: Diagnosis present

## 2023-06-17 DIAGNOSIS — I743 Embolism and thrombosis of arteries of the lower extremities: Secondary | ICD-10-CM | POA: Diagnosis not present

## 2023-06-17 DIAGNOSIS — I700 Atherosclerosis of aorta: Secondary | ICD-10-CM

## 2023-06-17 DIAGNOSIS — I70222 Atherosclerosis of native arteries of extremities with rest pain, left leg: Principal | ICD-10-CM | POA: Insufficient documentation

## 2023-06-17 DIAGNOSIS — Z9889 Other specified postprocedural states: Secondary | ICD-10-CM | POA: Diagnosis not present

## 2023-06-17 DIAGNOSIS — I25118 Atherosclerotic heart disease of native coronary artery with other forms of angina pectoris: Secondary | ICD-10-CM

## 2023-06-17 DIAGNOSIS — Z79899 Other long term (current) drug therapy: Secondary | ICD-10-CM | POA: Diagnosis present

## 2023-06-17 DIAGNOSIS — I739 Peripheral vascular disease, unspecified: Secondary | ICD-10-CM | POA: Diagnosis present

## 2023-06-17 DIAGNOSIS — R0600 Dyspnea, unspecified: Secondary | ICD-10-CM | POA: Diagnosis present

## 2023-06-17 DIAGNOSIS — I25119 Atherosclerotic heart disease of native coronary artery with unspecified angina pectoris: Secondary | ICD-10-CM | POA: Diagnosis not present

## 2023-06-17 DIAGNOSIS — I70212 Atherosclerosis of native arteries of extremities with intermittent claudication, left leg: Secondary | ICD-10-CM

## 2023-06-17 HISTORY — PX: LOWER EXTREMITY ANGIOGRAPHY: CATH118251

## 2023-06-17 LAB — CREATININE, SERUM
Creatinine, Ser: 0.7 mg/dL (ref 0.44–1.00)
GFR, Estimated: >60 mL/min (ref >60)

## 2023-06-17 LAB — BUN: BUN: 15 mg/dL (ref 8–23)

## 2023-06-17 SURGERY — LOWER EXTREMITY ANGIOGRAPHY
Anesthesia: Moderate Sedation | Site: Leg Lower | Laterality: Left

## 2023-06-17 MED ORDER — CEFAZOLIN SODIUM-DEXTROSE 2-4 GM/100ML-% IV SOLN
INTRAVENOUS | Status: AC
  Filled 2023-06-17: qty 100

## 2023-06-17 MED ORDER — IODIXANOL 320 MG/ML IV SOLN
INTRAVENOUS | Status: DC | PRN
  Administered 2023-06-17: 95 mL

## 2023-06-17 MED ORDER — CEFAZOLIN SODIUM-DEXTROSE 2-4 GM/100ML-% IV SOLN
2.0000 g | INTRAVENOUS | Status: AC
  Administered 2023-06-17: 2 g via INTRAVENOUS

## 2023-06-17 MED ORDER — SODIUM CHLORIDE 0.9 % IV SOLN: INTRAVENOUS | Status: DC

## 2023-06-17 MED ORDER — ASPIRIN 81 MG PO TBEC: 81.0000 mg | DELAYED_RELEASE_TABLET | Freq: Every evening | ORAL | Status: DC

## 2023-06-17 MED ORDER — HEPARIN SODIUM (PORCINE) 1000 UNIT/ML IJ SOLN
INTRAMUSCULAR | Status: AC
  Filled 2023-06-17: qty 10

## 2023-06-17 MED ORDER — SODIUM CHLORIDE 0.9 % IV SOLN: 250.0000 mL | INTRAVENOUS | Status: DC | PRN

## 2023-06-17 MED ORDER — FAMOTIDINE 20 MG PO TABS: 40.0000 mg | ORAL_TABLET | Freq: Once | ORAL | Status: DC | PRN

## 2023-06-17 MED ORDER — MORPHINE SULFATE (PF) 4 MG/ML IV SOLN: 2.0000 mg | INTRAVENOUS | Status: DC | PRN

## 2023-06-17 MED ORDER — NITROGLYCERIN 1 MG/10 ML FOR IR/CATH LAB
INTRA_ARTERIAL | Status: AC
  Filled 2023-06-17: qty 10

## 2023-06-17 MED ORDER — HEPARIN SODIUM (PORCINE) 1000 UNIT/ML IJ SOLN
INTRAMUSCULAR | Status: DC | PRN
  Administered 2023-06-17: 6000 [IU] via INTRAVENOUS

## 2023-06-17 MED ORDER — METHYLPREDNISOLONE SODIUM SUCC 125 MG IJ SOLR: 125.0000 mg | Freq: Once | INTRAMUSCULAR | Status: DC | PRN

## 2023-06-17 MED ORDER — TIROFIBAN (AGGRASTAT) BOLUS VIA INFUSION
25.0000 ug/kg | Freq: Once | INTRAVENOUS | Status: AC
  Administered 2023-06-17: 12500 ug via INTRAVENOUS

## 2023-06-17 MED ORDER — NITROGLYCERIN 1 MG/10 ML FOR IR/CATH LAB
INTRA_ARTERIAL | Status: DC | PRN
  Administered 2023-06-17: 300 ug via INTRA_ARTERIAL
  Administered 2023-06-17: 200 ug via INTRA_ARTERIAL

## 2023-06-17 MED ORDER — BUDESONIDE 3 MG PO CPEP
9.0000 mg | ORAL_CAPSULE | Freq: Every day | ORAL | Status: DC
  Filled 2023-06-17: qty 3

## 2023-06-17 MED ORDER — HYDROMORPHONE HCL 1 MG/ML IJ SOLN: 1.0000 mg | Freq: Once | INTRAMUSCULAR | Status: DC | PRN

## 2023-06-17 MED ORDER — TIROFIBAN HCL IV 12.5 MG/250 ML
INTRAVENOUS | Status: AC
  Filled 2023-06-17: qty 250

## 2023-06-17 MED ORDER — FENTANYL CITRATE (PF) 100 MCG/2ML IJ SOLN
INTRAMUSCULAR | Status: AC
  Filled 2023-06-17: qty 2

## 2023-06-17 MED ORDER — LIDOCAINE HCL (PF) 1 % IJ SOLN
INTRAMUSCULAR | Status: DC | PRN
  Administered 2023-06-17: 10 mL via INTRADERMAL

## 2023-06-17 MED ORDER — SODIUM CHLORIDE 0.9% FLUSH: 3.0000 mL | INTRAVENOUS | Status: DC | PRN

## 2023-06-17 MED ORDER — FENTANYL CITRATE (PF) 100 MCG/2ML IJ SOLN
INTRAMUSCULAR | Status: DC | PRN
  Administered 2023-06-17: 25 ug via INTRAVENOUS
  Administered 2023-06-17: 12.5 ug via INTRAVENOUS
  Administered 2023-06-17: 25 ug via INTRAVENOUS
  Administered 2023-06-17: 12.5 ug via INTRAVENOUS
  Administered 2023-06-17: 50 ug via INTRAVENOUS
  Administered 2023-06-17: 12.5 ug via INTRAVENOUS

## 2023-06-17 MED ORDER — SODIUM CHLORIDE 0.9% FLUSH
3.0000 mL | Freq: Two times a day (BID) | INTRAVENOUS | Status: DC
  Administered 2023-06-17: 3 mL via INTRAVENOUS

## 2023-06-17 MED ORDER — MIDAZOLAM HCL 2 MG/2ML IJ SOLN
INTRAMUSCULAR | Status: AC
  Filled 2023-06-17: qty 2

## 2023-06-17 MED ORDER — ONDANSETRON HCL 4 MG/2ML IJ SOLN: 4.0000 mg | Freq: Four times a day (QID) | INTRAMUSCULAR | Status: DC | PRN

## 2023-06-17 MED ORDER — CITALOPRAM HYDROBROMIDE 10 MG PO TABS: 10.0000 mg | ORAL_TABLET | Freq: Every day | ORAL | Status: DC

## 2023-06-17 MED ORDER — ACETAMINOPHEN 325 MG PO TABS: 650.0000 mg | ORAL_TABLET | ORAL | Status: DC | PRN

## 2023-06-17 MED ORDER — LEVOTHYROXINE SODIUM 75 MCG PO TABS
75.0000 ug | ORAL_TABLET | Freq: Every day | ORAL | Status: DC
  Administered 2023-06-18: 75 ug via ORAL
  Filled 2023-06-17: qty 3
  Filled 2023-06-17: qty 1

## 2023-06-17 MED ORDER — MIDAZOLAM HCL 2 MG/2ML IJ SOLN
INTRAMUSCULAR | Status: AC
  Filled 2023-06-17: qty 4

## 2023-06-17 MED ORDER — MIDAZOLAM HCL 2 MG/2ML IJ SOLN
INTRAMUSCULAR | Status: DC | PRN
  Administered 2023-06-17: .5 mg via INTRAVENOUS
  Administered 2023-06-17: 2 mg via INTRAVENOUS
  Administered 2023-06-17: .5 mg via INTRAVENOUS
  Administered 2023-06-17: 1 mg via INTRAVENOUS
  Administered 2023-06-17 (×2): .5 mg via INTRAVENOUS

## 2023-06-17 MED ORDER — MIDAZOLAM HCL 2 MG/ML PO SYRP: 8.0000 mg | ORAL_SOLUTION | Freq: Once | ORAL | Status: DC | PRN

## 2023-06-17 MED ORDER — FENTANYL CITRATE PF 50 MCG/ML IJ SOSY
PREFILLED_SYRINGE | INTRAMUSCULAR | Status: AC
  Filled 2023-06-17: qty 1

## 2023-06-17 MED ORDER — TIROFIBAN HCL IV 12.5 MG/250 ML
0.0750 ug/kg/min | INTRAVENOUS | Status: AC
  Administered 2023-06-17: 0.075 ug/kg/min via INTRAVENOUS
  Filled 2023-06-17: qty 250

## 2023-06-17 MED ORDER — SODIUM CHLORIDE 0.9 % IV SOLN: INTRAVENOUS | Status: AC

## 2023-06-17 MED ORDER — OXYCODONE HCL 5 MG PO TABS: 5.0000 mg | ORAL_TABLET | ORAL | Status: DC | PRN

## 2023-06-17 MED ORDER — DIPHENHYDRAMINE HCL 50 MG/ML IJ SOLN: 50.0000 mg | Freq: Once | INTRAMUSCULAR | Status: DC | PRN

## 2023-06-17 SURGICAL SUPPLY — 39 items
BALLN LUTONIX 018 4X300X130 (BALLOONS) ×1
BALLN LUTONIX 018 4X60X130 (BALLOONS) ×2
BALLN LUTONIX 018 5X60X130 (BALLOONS) ×1
BALLN ULTRVRSE 2X150X150 (BALLOONS) ×1
BALLN ULTRVRSE 3X100X150 (BALLOONS) ×1
BALLOON LUTONIX 018 4X300X130 (BALLOONS) IMPLANT
BALLOON LUTONIX 018 4X60X130 (BALLOONS) IMPLANT
BALLOON LUTONIX 018 5X60X130 (BALLOONS) IMPLANT
BALLOON ULTRVRSE 2X150X150 (BALLOONS) IMPLANT
BALLOON ULTRVRSE 3X100X150 (BALLOONS) IMPLANT
CANISTER PENUMBRA ENGINE (MISCELLANEOUS) IMPLANT
CATH ANGIO 5F PIGTAIL 65CM (CATHETERS) IMPLANT
CATH INDIGO CAT6 KIT (CATHETERS) IMPLANT
CATH INDIGO SEP 6 (CATHETERS) IMPLANT
CATH ROTAREX 135 6FR (CATHETERS) IMPLANT
CATH VERT 5FR 125CM (CATHETERS) IMPLANT
CATH VERT 5X100 (CATHETERS) IMPLANT
COVER PROBE ULTRASOUND 5X96 (MISCELLANEOUS) IMPLANT
DEVICE STARCLOSE SE CLOSURE (Vascular Products) IMPLANT
GLIDEWIRE ADV .035X180CM (WIRE) IMPLANT
GLIDEWIRE ADV .035X260CM (WIRE) IMPLANT
GOWN STRL REUS W/ TWL LRG LVL3 (GOWN DISPOSABLE) ×1 IMPLANT
GOWN STRL REUS W/TWL LRG LVL3 (GOWN DISPOSABLE) ×1
GUIDEWIRE PFTE-COATED .018X300 (WIRE) IMPLANT
KIT ENCORE 26 ADVANTAGE (KITS) IMPLANT
NDL ENTRY 21GA 7CM ECHOTIP (NEEDLE) IMPLANT
NEEDLE ENTRY 21GA 7CM ECHOTIP (NEEDLE) ×1 IMPLANT
PACK ANGIOGRAPHY (CUSTOM PROCEDURE TRAY) ×1 IMPLANT
SET INTRO CAPELLA COAXIAL (SET/KITS/TRAYS/PACK) IMPLANT
SHEATH BRITE TIP 5FRX11 (SHEATH) IMPLANT
SHEATH PINNACLE MP 6F 45CM (SHEATH) IMPLANT
SHEATH RAABE 6FR (SHEATH) IMPLANT
STENT LIFESTENT 5F 6X100X135 (Permanent Stent) IMPLANT
SYR MEDRAD MARK 7 150ML (SYRINGE) IMPLANT
TUBING CONTRAST HIGH PRESS 72 (TUBING) IMPLANT
WIRE G V18X300CM (WIRE) IMPLANT
WIRE GUIDERIGHT .035X150 (WIRE) IMPLANT
WIRE RUNTHROUGH .014X300CM (WIRE) IMPLANT
WIRE SUPRACORE 300CM (WIRE) IMPLANT

## 2023-06-17 NOTE — Op Note (Signed)
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  Post-operative diagnosis:  Same  Procedure(s) Performed:             1.  Introduction catheter into left lower extremity 3rd order catheter placement with additional third order catheter placement.             2.    Contrast injection left lower extremity for distal runoff             3.  Percutaneous transluminal angioplasty and stent placement left superficial femoral and popliteal arteries to 5 mm             4.  Percutaneous transluminal angioplasty left anterior tibial and tibioperoneal trunk and peroneal.             5.  Thrombectomy left SFA and popliteal using the [place] Rex catheter.             6.  Thrombectomy left anterior tibial artery using the penumbra CAT 6 catheter             7.  Star close closure right  common femoral arteriotomy  Anesthesia: Conscious sedation was administered under my direct supervision by the interventional radiology RN. IV Versed plus fentanyl were utilized. Continuous ECG, pulse oximetry and blood pressure was monitored throughout the entire procedure.  Conscious sedation was for a total of 1 hour 55 minutes and 35 seconds.  Sheath: 6 [place] Rabie sheath exchanged for a 6 [place] destination sheath retrograde right common femoral  Contrast: 95 cc  Fluoroscopy Time: 16.5 minutes  Indications:  Melanie Browning presents with increasing pain of the left lower extremity.  Noninvasive study performed yesterday as well as physical examination are consistent with reocclusion of the SFA popliteal stent this suggests the patient is having limb threatening ischemia. The risks and benefits are reviewed all questions answered patient agrees to proceed.  Procedure:   Melanie Browning is a 82 y.o.  y.o. female who was identified and appropriate procedural time out was performed.  The patient was then placed supine on the table and prepped and draped in the usual sterile fashion.    Ultrasound was placed in the sterile sleeve and the right groin was evaluated the right common femoral artery was echolucent and pulsatile indicating patency.  Image was recorded for the permanent record and under real-time visualization a microneedle was inserted into the common femoral artery followed by the microwire and then the micro-sheath.  A J-wire was then advanced through the micro-sheath and a  5 [place] sheath was then inserted over a J-wire. J-wire was then advanced and a 5 French pigtail catheter was positioned at the level of T12.  AP projection of the aorta was then obtained. Pigtail catheter was repositioned to above the bifurcation and a RAO view of the pelvis was obtained.  Subsequently a pigtail catheter with an Advantage wire was used to cross the aortic bifurcation.  The catheter and wire were advanced down into the left distal external iliac artery. Oblique view of the femoral bifurcation was then obtained and subsequently the wire was reintroduced and the pigtail catheter negotiated into the SFA representing third order catheter placement. Distal runoff was then performed.  6000 units of heparin was then given and allowed to circulate for several minutes.  A 6 [place]  sheath was advanced up and over the bifurcation and positioned in the superficial femoral artery  Hand-injection of contrast through the sheath proximally reconstituted what I believe was the anterior tibial.  KMP catheter and advantage Glidewire were then negotiated down into the distal popliteal and then into the anterior tibial. Catheter was then advanced. Hand injection contrast demonstrated the anterior tibial anatomy in further detail and demonstrated intraluminal positioning.  A 3 mm x 100 mm Ultraverse balloon was used to  angioplasty the proximal anterior tibial and distal popliteal artery.  The inflation was for 1 minute at 8 atm. 0.035 advantage wire was exchanged for a V18 wire and the [place] Rex catheter prepped on the field.  Thrombectomy was then performed from the leading edge of the stent in the mid SFA down through the popliteal.  Follow-up imaging now demonstrated recanalization with forward flow Next a 4 mm x 300 mm balloon was then advanced across the popliteal and SFA inflated to 8 atm for 1 minute.    At this point imaging through the SFA and popliteal demonstrated very slow flow and there appeared to be obstruction at the level of the tibial artery.  Intra-arterial nitroglycerin was administered but this did not improve the overall flow dynamic.  A 4 mm Lutonix drug-eluting balloon was then advanced down across the distal popliteal and into the proximal anterior tibial.  Inflation of 4 mm was performed.  This demonstrated improved flow however there now appears to be thrombus within the anterior tibial proper and poor flow distally perhaps related to spasm.  A 0.035 supra core wire was introduced and the Rabie sheath exchanged for a destination sheath which was advanced up and over the bifurcation position with its tip in the superficial femoral artery.  The penumbra CAT 6 device was then prepped on the field and using a combination of the CAT 6 with a separator the entire length of the anterior tibial was treated.  This demonstrated marked improvement in flow.  Distally there did appear to be narrowing and a 2 mm x 150 mm Ultraverse balloon was utilized to treat the distal anterior tibial.  Hand-injection of contrast from the sheath proximally now demonstrated fairly brisk flow throughout the SFA popliteal and anterior tibial.  This also now demonstrated reconstitution of the posterior tibial and the peroneal distal to an occluded tibioperoneal trunk.  The catheter and wire were then pulled back into the distal  popliteal and after an RAO projection was obtained I was able to negotiate the wire and catheter through the occluded tibioperoneal trunk into the peroneal.  Hand-injection contrast verified intraluminal positioning.  A 4 mm x 60 mm Lutonix drug-eluting balloon was then advanced across this occlusion inflated to 6 atm for 1 full minute.  Follow-up imaging now demonstrated rapid flow of contrast through the tibioperoneal trunk and peroneal.  Imaging at this point by hand-injection also shows reconstitution of the posterior tibial artery and the proximal one third.  This artery is widely patent and free of any hemodynamically significant disease down to the ankle filling the lateral and medial plantar arteries as well as the pedal arch.  This will serve as an excellent target for future bypass.  I then reimaged the SFA stent just proximal to Hunter's canal and greater than 60% residual stenosis was noted in the 20 mm just proximal to the stent or at the leading edge of the stent.  There also appeared to be some retained thrombus slightly more distally.  I  elected to place a 6 mm x 100 mm life stent and postdilated this with a 4 mm Lutonix drug-eluting balloon inflated to 8 atm for approximately 1 minute.  Follow-up imaging now demonstrated less than 20% residual stenosis throughout the entire length of the SFA popliteal with patency of the anterior tibial as well as the tibioperoneal trunk and peroneal.  After review of these images the sheath is pulled into the right external iliac oblique of the common femoral is obtained and a Star close device deployed. There no immediate Complications.  Findings:  The abdominal aorta is opacified with a bolus injection contrast. Renal arteries are single and widely patent without evidence of hemodynamically significant stenosis.  The aorta itself has diffuse disease but no hemodynamically significant lesions. The common and external iliac arteries are widely patent  bilaterally.  The left common femoral is widely patent as is the profunda femoris.  The SFA does indeed occlude approximately 2 cm above or proximal to the leading edge of the existing stent.  The entire popliteal is occluded.  There is faint reconstitution of the proximal anterior tibial.  There is poor visualization of the posterior tibial and peroneal on initial imaging.  Following thrombectomy there is establishment of a channel through the SFA popliteal.  Unfortunately there does appear to been some thrombus that embolized distally into the anterior tibial and this was treated with thrombectomy using the penumbra CAT 6 catheter successfully as well.  Angioplasty within the anterior tibial and tibioperoneal trunk as well as the peroneal as described below was successful.  Angioplasty and stent placement in the mid SFA also was successful. Following angioplasty tibioperoneal trunk and peroneal now is in-line flow and looks quite nice with less than 10% residual stenosis.   Follow-up imaging now demonstrated rapid flow of contrast through the tibioperoneal trunk and peroneal.  Imaging at this point by hand-injection also shows reconstitution of the posterior tibial artery and the proximal one third.  This artery is widely patent and free of any hemodynamically significant disease down to the ankle filling the lateral and medial plantar arteries as well as the pedal arch.  This will serve as an excellent target for future bypass.  Summary: Successful recanalization left lower extremity for limb salvage and elimination of rest pain.  At this point I believe we should move forward with a more definitive bypass before she reoccludes again and have discussed this with Melanie Browning and she is in agreement.                           Disposition: Patient was taken to the recovery room in stable condition having tolerated the procedure well.  Earl Lites Arushi Partridge 06/17/2023,5:20 PM

## 2023-06-17 NOTE — Interval H&P Note (Signed)
History and Physical Interval Note:  06/17/2023 1:47 PM  Female Masis  has presented today for surgery, with the diagnosis of LLE Angio   ASO w claudication.  The various methods of treatment have been discussed with the patient and family. After consideration of risks, benefits and other options for treatment, the patient has consented to  Procedure(s): Lower Extremity Angiography (Left) as a surgical intervention.  The patient's history has been reviewed, patient examined, no change in status, stable for surgery.  I have reviewed the patient's chart and labs.  Questions were answered to the patient's satisfaction.     Levora Dredge

## 2023-06-17 NOTE — Patient Instructions (Addendum)
Medication Instructions:  No changes  If you need a refill on your cardiac medications before your next appointment, please call your pharmacy.   Lab work: Labs today: CBC, lfts, lipids  Testing/Procedures: Your physician has requested that you have an echocardiogram. Echocardiography is a painless test that uses sound waves to create images of your heart. It provides your doctor with information about the size and shape of your heart and how well your heart's chambers and valves are working.   You may receive an ultrasound enhancing agent through an IV if needed to better visualize your heart during the echo. This procedure takes approximately one hour.  There are no restrictions for this procedure.  This will take place at [address], [place] [address]   Your provider has ordered a Lexiscan/ Exercise Myoview Stress test. This will take place at [facility]. Please report to the [facility] medical mall entrance. The volunteers at the first desk will direct you where to go.  [facility] MYOVIEW  Your provider has ordered a Stress Test with nuclear imaging. The purpose of this test is to evaluate the blood supply to your heart muscle. This procedure is referred to as a "Non-Invasive Stress Test." This is because other than having an IV started in your vein, nothing is inserted or "invades" your body. Cardiac stress tests are done to find areas of poor blood flow to the heart by determining the extent of coronary artery disease (CAD). Some patients exercise on a treadmill, which naturally increases the blood flow to your heart, while others who are unable to walk on a treadmill due to physical limitations will have a pharmacologic/chemical stress agent called Lexiscan . This medicine will mimic walking on a treadmill by temporarily increasing your coronary blood flow.   Please note: these test may take anywhere between 2-4 hours to complete  How to prepare for your Myoview  test:  Nothing to eat for 6 hours prior to the test No caffeine for 24 hours prior to test No smoking 24 hours prior to test. Your medication may be taken with water.  If your doctor stopped a medication because of this test, do not take that medication. Ladies, please do not wear dresses.  Skirts or pants are appropriate. Please wear a short sleeve shirt. No perfume, cologne or lotion. Wear comfortable walking shoes. No heels!   PLEASE NOTIFY THE OFFICE AT LEAST 24 HOURS IN ADVANCE IF YOU ARE UNABLE TO KEEP YOUR APPOINTMENT.  [phone] AND  PLEASE NOTIFY NUCLEAR MEDICINE AT [facility] AT LEAST 24 HOURS IN ADVANCE IF YOU ARE UNABLE TO KEEP YOUR APPOINTMENT. [phone]    Follow-Up: At [facility], you and your health needs are our priority.  As part of our continuing mission to provide you with exceptional heart care, we have created designated Provider Care Teams.  These Care Teams include your primary Cardiologist (physician) and Advanced Practice Providers (APPs -  Physician Assistants and Nurse Practitioners) who all work together to provide you with the care you need, when you need it.  You will need a follow up appointment in 6 months  Providers on your designated Care Team:   Nicolasa Ducking, NP Eula Listen, PA-C Cadence Fransico Michael, [place]  COVID-19 Vaccine Information can be found at: [email or web address] For questions related to vaccine distribution or appointments, please email [email or web address] or call 9305277690.

## 2023-06-18 ENCOUNTER — Encounter: Payer: Self-pay | Admitting: Vascular Surgery

## 2023-06-18 ENCOUNTER — Other Ambulatory Visit (HOSPITAL_COMMUNITY): Payer: Self-pay

## 2023-06-18 DIAGNOSIS — I70222 Atherosclerosis of native arteries of extremities with rest pain, left leg: Secondary | ICD-10-CM | POA: Diagnosis not present

## 2023-06-18 MED ORDER — TICAGRELOR 90 MG PO TABS
90.0000 mg | ORAL_TABLET | Freq: Once | ORAL | Status: AC
  Administered 2023-06-18: 90 mg via ORAL
  Filled 2023-06-18: qty 1

## 2023-06-18 MED ORDER — LOSARTAN POTASSIUM 25 MG PO TABS
25.0000 mg | ORAL_TABLET | Freq: Every day | ORAL | Status: DC
  Administered 2023-06-18: 25 mg via ORAL
  Filled 2023-06-18: qty 1

## 2023-06-18 NOTE — Discharge Summary (Cosign Needed Addendum)
[facility]    Discharge Summary    Patient ID:  Melanie Browning MRN: [id number] DOB/AGE: [birth year] 80 y.o.  Admit date: 06/17/2023 Discharge date: 06/18/2023 Date of Surgery: 06/17/2023 Surgeon: Surgeon(s): Schnier, Latina Craver, MD  Admission Diagnosis: LLE Angio   ASO w claudication  Discharge Diagnoses:  LLE Angio   ASO w claudication  Secondary Diagnoses: Past Medical History:  Diagnosis Date   Arthritis    Colitis    Dry eye    bilateral when living in [facility]   History of cervical cancer    had hysterectomy   HPV (human papilloma virus) infection    Hyperlipidemia    Ischemia of left lower extremity    Osteoporosis    Polymyalgia rheumatica (HCC)    Skin cancer    nose   Thyroid disease    Vaginal dysplasia     Procedure(s): Lower Extremity Angiography  Discharged Condition: good  HPI:  Melanie Browning is an 82 yo female now POD# 1 from left lower extremity angioplasty with stent placement. Patient is resting comfortably today. Right groin incision in clean dry and intact. No hematoma or seroma to note. Patient ambulating, urinating and eating well. No complications to note. Vitals all remain stable. Patient to be discharged home.   Patient discharged on ASA 81 mg daily, Brilinta 90 mg daily, and Lipitor 40 mg daily with stent placement.   Hospital Course:  Melanie Browning is a 82 y.o. female is S/P Left Lower extremity angiogram with angioplasty and stent placement.  Extubated: POD # 0 Physical Exam:   Alert notes x3, no acute distress Face: Symmetrical.  Tongue is midline. Neck: Trachea is midline.  No swelling or bruising. Cardiovascular: Regular rate and rhythm Pulmonary: Clear to auscultation bilaterally Abdomen: Soft, nontender, nondistended Right groin access: Clean dry and intact.  No swelling or drainage noted Left groin access: Clean dry and intact.  No swelling or drainage noted Left lower extremity: Thigh soft.  Calf  soft.  Extremities warm distally toes. Hard to palpate pedal pulses however the foot is warm is her good capillary refill. Right lower extremity: Thigh soft.  Calf soft.  Extremities warm distally toes. Hard to palpate pedal pulses however the foot is warm is her good capillary refill. Neurological: No deficits noted   Post-op wounds:  clean, dry, intact or healing well  Pt. Ambulating, voiding and taking PO diet without difficulty. Pt pain controlled with PO pain meds.  Labs:  As below  Complications: none  Consults:    Significant Diagnostic Studies: CBC Lab Results  Component Value Date   WBC 7.9 06/17/2023   HGB 13.2 06/17/2023   HCT 39.8 06/17/2023   MCV 99 (H) 06/17/2023   PLT 181 06/17/2023    BMET    Component Value Date/Time   BUN 15 06/17/2023 1148   CREATININE 0.70 06/17/2023 1148   GFRNONAA >60 06/17/2023 1148   COAG No results found for: "INR", "PROTIME"   Disposition:  Discharge to :Home  Allergies as of 06/18/2023       Reactions   Lactose Other (See Comments), Nausea And Vomiting   Gi upset and diarrhea Other reaction(s): Other (See Comments) Gi upset and diarrhea  Other reaction(s): Other (See Comments) Gi upset and diarrhea Gi upset and diarrhea Gi upset and diarrhea    Gi upset and diarrhea    Gi upset and diarrhea Gi upset and diarrhea Gi upset and diarrhea Gi upset and diarrhea  Gi upset and diarrhea  Other reaction(s): Other (See Comments) Gi upset and diarrhea Gi upset and diarrhea Gi upset and diarrhea  Gi upset and diarrhea  Gi upset and diarrhea Gi upset and diarrhea Gi upset and diarrhea        Medication List     TAKE these medications    aspirin EC 81 MG tablet Take 81 mg by mouth every evening. Swallow whole.   atorvastatin 40 MG tablet Commonly known as: LIPITOR Take 40 mg by mouth every evening.   budesonide 3 MG 24 hr capsule Commonly known as: ENTOCORT EC Take 9 mg by mouth daily.   citalopram 10 MG  tablet Commonly known as: CELEXA Take 10 mg by mouth daily.   diphenoxylate-atropine 2.5-0.025 MG tablet Commonly known as: LOMOTIL TAKE 1 TABLET BY MOUTH 3 TIMES DAILY AS NEEDED FOR DIARRHEA.   estradiol 0.1 MG/GM vaginal cream Commonly known as: ESTRACE Insert 1/4 applicator full into vagina at night twice a week   ezetimibe 10 MG tablet Commonly known as: ZETIA Take 1 tablet (10 mg total) by mouth daily.   HYDROcodone-acetaminophen 5-325 MG tablet Commonly known as: Norco Take 1-2 tablets by mouth every 6 (six) hours as needed for moderate pain or severe pain.   levothyroxine 75 MCG tablet Commonly known as: SYNTHROID Take 75 mcg by mouth daily before breakfast.   losartan 25 MG tablet Commonly known as: COZAAR Take 25 mg by mouth daily.   nitroGLYCERIN 0.4 MG SL tablet Commonly known as: NITROSTAT Place under the tongue.   ticagrelor 90 MG Tabs tablet Commonly known as: Brilinta Take 1 tablet (90 mg total) by mouth 2 (two) times daily.   triamcinolone cream 0.1 % Commonly known as: KENALOG Apply 1 Application topically 2 (two) times daily.   Vitamin B-Complex Tabs Take 1 tablet by mouth daily.       Verbal and written Discharge instructions given to the patient. Wound care per Discharge AVS   Signed: Marcie Bal, NP  06/18/2023, 12:54 PM

## 2023-06-18 NOTE — Progress Notes (Signed)
At 2015 writer in to assess pt's Right femoral dressing. Pt's Right fem site bleeding through dressing and soaked a purewick . Pt cleaned and additional pressure dressing applied to Right femoral site. Dr. Gilda Crease notified and new orders given. Aggastat started at 6.48ml/hr. VSS. Will continue to monitor.

## 2023-06-18 NOTE — Progress Notes (Signed)
Pt with no further breakthrough bleeding noted. Pt did use bedpan due to inability to use purewick. Urine output approx clear yellow urine. Telemetry reapplied. Will continue to monitor.

## 2023-06-18 NOTE — TOC Benefit Eligibility Note (Signed)
Patient Product/process development scientist completed.    The patient is insured through [facility]. Patient has Medicare and is not eligible for a copay card, but may be able to apply for patient assistance, if available.    Ran test claim for Repatha Sureclick 140 mg/ml Soaj and Requires Prior Authorization   This test claim was processed through [employer]- copay amounts may vary at other pharmacies due to [employer], or as the patient moves through the different stages of their insurance plan.     Roland Earl, CPHT Pharmacy Technician III Certified Patient Advocate [facility] Patient Advocate Team Direct Number: [phone]  Fax: [phone]

## 2023-06-18 NOTE — Discharge Instructions (Signed)
Do not lift any thing heavy for the next two weeks. Do not lift anything heavier than a gallon of Milk.   You may shower tomorrow when home. Remove dressing to the insertion site after showering and replace with a band aid for the next 3 days.   Do not drive for one week after procedure especially if taking narcotic pain medications.   Follow up with [facility] to schedule your surgery in October.

## 2023-06-18 NOTE — Care Management CC44 (Signed)
Condition Code 44 Documentation Completed  Patient Details  Name: Sameka Meenach MRN: [id number] Date of Birth: 1940-11-16   Condition Code 44 given:  Yes Patient signature on Condition Code 44 notice:  Yes Documentation of 2 MD's agreement:  Yes Code 44 added to claim:  Yes    Truddie Hidden, RN 06/18/2023, 3:31 PM

## 2023-06-20 ENCOUNTER — Telehealth (INDEPENDENT_AMBULATORY_CARE_PROVIDER_SITE_OTHER): Payer: Self-pay

## 2023-06-20 ENCOUNTER — Telehealth: Payer: Self-pay | Admitting: Cardiovascular Disease

## 2023-06-20 NOTE — Telephone Encounter (Signed)
Scheduled 9/26 at [facility]

## 2023-06-20 NOTE — Telephone Encounter (Addendum)
Spoke w/ pt.  She reports that she had visit w/ Dr. Mariah Milling on Tues and saw Dr. Gilda Crease later that day who sched her her bypass on left popliteal artery on 07/16/23. She previously had 7 stents placed in this artery, so bypass is next option, as she "has too many stents and they won't last". She already has 1 coronary stent and has no issues w/ that, but Dr. Gilda Crease wants Dr. Windell Hummingbird blessing to proceed w/ bypass.   She states that the next available ECHO in our office is not until 10/10 and she is worried this is not enough time to intervene if needed before her bypass on 10/16. She would like to know if she can be squeezed in sooner, as she lives at [facility] and our location is so close, but is willing to go elsewhere if needed. Advised her that I will make scheduling aware and see what they can do. She also asks that I make Dr. Mariah Milling aware in case he can help expedite her request.

## 2023-06-20 NOTE — Telephone Encounter (Signed)
Pt is requesting a callback regarding her needing to schedule her ECHO but she stated that having it done in October is too late so she'd like to see if she can have the location on the order changed and maybe coming in sooner. She'd like to discuss further with a nurse. Please advise

## 2023-06-20 NOTE — Telephone Encounter (Signed)
Spoke with the patient and she is scheduled with Dr. Gilda Crease on 07/16/23 for a left femoral posterior tibial bypass at [facility]. Pre-op is scheduled on 07/07/23 at 9:00 am at the [facility]. Pre-surgical instructions were discussed and will be sent to Mychart and mailed.

## 2023-06-24 ENCOUNTER — Encounter
Admission: RE | Admit: 2023-06-24 | Discharge: 2023-06-24 | Disposition: A | Discharge status: Home / Self Care | Payer: Medicare Other | Source: Ambulatory Visit | Attending: Cardiovascular Disease | Admitting: Cardiovascular Disease

## 2023-06-24 DIAGNOSIS — I25118 Atherosclerotic heart disease of native coronary artery with other forms of angina pectoris: Secondary | ICD-10-CM | POA: Insufficient documentation

## 2023-06-24 DIAGNOSIS — R0600 Dyspnea, unspecified: Secondary | ICD-10-CM | POA: Diagnosis present

## 2023-06-24 DIAGNOSIS — I209 Angina pectoris, unspecified: Secondary | ICD-10-CM | POA: Diagnosis present

## 2023-06-24 MED ORDER — REGADENOSON 0.4 MG/5ML IV SOLN
0.4000 mg | Freq: Once | INTRAVENOUS | Status: AC
  Administered 2023-06-24: 0.4 mg via INTRAVENOUS

## 2023-06-24 MED ORDER — TECHNETIUM TC 99M TETROFOSMIN IV KIT
10.0000 | PACK | Freq: Once | INTRAVENOUS | Status: AC
  Administered 2023-06-24: 10.39 via INTRAVENOUS

## 2023-06-24 MED ORDER — TECHNETIUM TC 99M TETROFOSMIN IV KIT
29.7200 | PACK | Freq: Once | INTRAVENOUS | Status: AC | PRN
  Administered 2023-06-24: 29.72 via INTRAVENOUS

## 2023-06-25 LAB — NM MYOCAR MULTI W/SPECT W/WALL MOTION / EF
LV dias vol: 62 mL (ref 46–106)
LV sys vol: 24 mL
MPHR: 98 {beats}/min
Nuc Stress EF: 61 %
Peak HR: 98 {beats}/min
Percent HR: 70 %
Rest HR: 68 {beats}/min
Rest Nuclear Isotope Dose: 10.4 mCi
SDS: 0
SRS: 7
SSS: 3
Stress Nuclear Isotope Dose: 29.7 mCi
TID: 1.02

## 2023-06-26 ENCOUNTER — Ambulatory Visit (HOSPITAL_COMMUNITY): Payer: Medicare Other | Attending: Cardiovascular Disease

## 2023-06-26 DIAGNOSIS — R0600 Dyspnea, unspecified: Secondary | ICD-10-CM | POA: Insufficient documentation

## 2023-06-26 DIAGNOSIS — R0609 Other forms of dyspnea: Secondary | ICD-10-CM

## 2023-06-26 DIAGNOSIS — I5189 Other ill-defined heart diseases: Secondary | ICD-10-CM

## 2023-06-26 DIAGNOSIS — I350 Nonrheumatic aortic (valve) stenosis: Secondary | ICD-10-CM

## 2023-06-26 DIAGNOSIS — I209 Angina pectoris, unspecified: Secondary | ICD-10-CM | POA: Insufficient documentation

## 2023-06-26 HISTORY — DX: Other ill-defined heart diseases: I51.89

## 2023-06-26 HISTORY — DX: Nonrheumatic aortic (valve) stenosis: I35.0

## 2023-06-26 LAB — ECHOCARDIOGRAM COMPLETE
AR max vel: 0.75 cm2
AV Area VTI: 0.73 cm2
AV Area mean vel: 0.74 cm2
AV Mean grad: 18.2 mmHg
AV Peak grad: 30.4 mmHg
Ao pk vel: 2.76 m/s
Area-P 1/2: 3.74 cm2
P 1/2 time: 337 msec
S' Lateral: 2.6 cm

## 2023-06-30 ENCOUNTER — Other Ambulatory Visit (INDEPENDENT_AMBULATORY_CARE_PROVIDER_SITE_OTHER): Payer: Self-pay | Admitting: Vascular Surgery

## 2023-07-06 ENCOUNTER — Encounter (INDEPENDENT_AMBULATORY_CARE_PROVIDER_SITE_OTHER): Payer: Self-pay | Admitting: Vascular Surgery

## 2023-07-07 ENCOUNTER — Other Ambulatory Visit: Payer: Self-pay

## 2023-07-07 ENCOUNTER — Other Ambulatory Visit (INDEPENDENT_AMBULATORY_CARE_PROVIDER_SITE_OTHER): Payer: Self-pay | Admitting: Nurse Practitioner

## 2023-07-07 ENCOUNTER — Encounter: Payer: Self-pay | Admitting: Cardiovascular Disease

## 2023-07-07 ENCOUNTER — Encounter
Admission: RE | Admit: 2023-07-07 | Discharge: 2023-07-07 | Disposition: A | Discharge status: Home / Self Care | Payer: Medicare Other | Source: Ambulatory Visit | Attending: Vascular Surgery | Admitting: Vascular Surgery

## 2023-07-07 ENCOUNTER — Telehealth: Payer: Self-pay | Admitting: *Deleted

## 2023-07-07 DIAGNOSIS — Z01812 Encounter for preprocedural laboratory examination: Secondary | ICD-10-CM | POA: Insufficient documentation

## 2023-07-07 DIAGNOSIS — I739 Peripheral vascular disease, unspecified: Secondary | ICD-10-CM | POA: Diagnosis not present

## 2023-07-07 DIAGNOSIS — Z01818 Encounter for other preprocedural examination: Secondary | ICD-10-CM

## 2023-07-07 HISTORY — DX: Hyperlipidemia, unspecified: E78.5

## 2023-07-07 HISTORY — DX: Impaired glucose tolerance (oral): R73.02

## 2023-07-07 HISTORY — DX: Spondylosis without myelopathy or radiculopathy, cervical region: M47.812

## 2023-07-07 HISTORY — DX: Nutritional anemia, unspecified: D53.9

## 2023-07-07 HISTORY — DX: Atherosclerosis of native arteries of extremities with intermittent claudication, left leg: I70.212

## 2023-07-07 HISTORY — DX: Elevation of levels of liver transaminase levels: R74.01

## 2023-07-07 HISTORY — DX: Peripheral vascular disease, unspecified: I73.9

## 2023-07-07 HISTORY — DX: Hypothyroidism, unspecified: E03.9

## 2023-07-07 LAB — BASIC METABOLIC PANEL
Anion gap: 6 (ref 5–15)
BUN: 17 mg/dL (ref 8–23)
CO2: 24 mmol/L (ref 22–32)
Calcium: 8.9 mg/dL (ref 8.9–10.3)
Chloride: 103 mmol/L (ref 98–111)
Creatinine, Ser: 0.59 mg/dL (ref 0.44–1.00)
GFR, Estimated: >60 mL/min (ref >60)
Glucose, Bld: 102 mg/dL — ABNORMAL HIGH (ref 70–99)
Potassium: 3.8 mmol/L (ref 3.5–5.1)
Sodium: 133 mmol/L — ABNORMAL LOW (ref 135–145)

## 2023-07-07 LAB — SURGICAL PCR SCREEN
MRSA, PCR: NEGATIVE
Staphylococcus aureus: NEGATIVE

## 2023-07-07 NOTE — Patient Instructions (Addendum)
Your procedure is scheduled on:  Wednesday, October 16  Report to the Registration Desk on the 1st floor of the [facility] To find out your arrival time, please call [phone] between 1PM - 3PM on:  Tuesday, October 15 If your arrival time is 6:00 am, do not arrive before that time as the [facility] entrance doors do not open until 6:00 am.  REMEMBER: Instructions that are not followed completely may result in serious medical risk, up to and including death; or upon the discretion of your surgeon and anesthesiologist your surgery may need to be rescheduled.  Do not eat food after midnight the night before surgery.  No gum chewing or hard candies.  One week prior to surgery: Wednesday October 9  Stop Anti-inflammatories (NSAIDS) such as Advil, Aleve, Ibuprofen, Motrin, Naproxen, Naprosyn and Aspirin based products such as Excedrin, Goody's Powder, BC Powder.  Stop ANY OVER THE COUNTER supplements until after surgery. B Complex Vitamins (VITAMIN B-COMPLEX)   You may however, continue to take Tylenol if needed for pain up until the day of surgery.  Continue taking all prescribed medications with the exception of the following: losartan (COZAAR) do not take the morning of surgery, last dose Tuesday October 15   Follow recommendations from Cardiologist or PCP regarding stopping blood thinners. BRILINTA ( Cardiology will contact you regarding when to stop)   TAKE ONLY THESE MEDICATIONS THE MORNING OF SURGERY WITH A SIP OF WATER:  budesonide (ENTOCORT EC) if needed  levothyroxine (SYNTHROID)   No Alcohol for 24 hours before or after surgery.  No Smoking including e-cigarettes for 24 hours before surgery.  No chewable tobacco products for at least 6 hours before surgery.  No nicotine patches on the day of surgery.  Do not use any "recreational" drugs for at least a week (preferably 2 weeks) before your surgery.  Please be advised that the combination of cocaine and anesthesia  may have negative outcomes, up to and including death. If you test positive for cocaine, your surgery will be cancelled.  On the morning of surgery brush your teeth with toothpaste and water, you may rinse your mouth with mouthwash if you wish. Do not swallow any toothpaste or mouthwash.  Use CHG Soap as directed on instruction sheet.  Do not wear jewelry, make-up, hairpins, clips or nail polish.  For welded (permanent) jewelry: bracelets, anklets, waist bands, etc.  Please have this removed prior to surgery.  If it is not removed, there is a chance that hospital personnel will need to cut it off on the day of surgery.  Do not wear lotions, powders, or perfumes.   Do not shave body hair from the neck down 48 hours before surgery.  Contact lenses, hearing aids and dentures may not be worn into surgery.  Do not bring valuables to the hospital. [facility] is not responsible for any missing/lost belongings or valuables.   Notify your doctor if there is any change in your medical condition (cold, fever, infection).  Wear comfortable clothing (specific to your surgery type) to the hospital.  After surgery, you can help prevent lung complications by doing breathing exercises.  Take deep breaths and cough every 1-2 hours.   If you are being admitted to the hospital overnight, leave your suitcase in the car. After surgery it may be brought to your room.  In case of increased patient census, it may be necessary for you, the patient, to continue your postoperative care in the Same Day Surgery department.  If you are being discharged the day of surgery, you will not be allowed to drive home. You will need a responsible individual to drive you home and stay with you for 24 hours after surgery.   If you are taking public transportation, you will need to have a responsible individual with you.  Please call the Pre-admissions Testing Dept. at [phone] if you have any questions about these  instructions.  Surgery Visitation Policy:  Patients having surgery or a procedure may have two visitors.  Children under the age of 69 must have an adult with them who is not the patient.  Inpatient Visitation:    Visiting hours are 7 a.m. to 8 p.m. Up to four visitors are allowed at one time in a patient room. The visitors may rotate out with other people during the day.  One visitor age 62 or older may stay with the patient overnight and must be in the room by 8 p.m.            Preparing for Surgery with CHLORHEXIDINE GLUCONATE (CHG) Soap  Chlorhexidine Gluconate (CHG) Soap  o An antiseptic cleaner that kills germs and bonds with the skin to continue killing germs even after washing  o Used for showering the night before surgery and morning of surgery  Before surgery, you can play an important role by reducing the number of germs on your skin.  CHG (Chlorhexidine gluconate) soap is an antiseptic cleanser which kills germs and bonds with the skin to continue killing germs even after washing.  Please do not use if you have an allergy to CHG or antibacterial soaps. If your skin becomes reddened/irritated stop using the CHG.  1. Shower the NIGHT BEFORE SURGERY and the MORNING OF SURGERY with CHG soap.  2. If you choose to wash your hair, wash your hair first as usual with your normal shampoo.  3. After shampooing, rinse your hair and body thoroughly to remove the shampoo.  4. Use CHG as you would any other liquid soap. You can apply CHG directly to the skin and wash gently with a scrungie or a clean washcloth.  5. Apply the CHG soap to your body only from the neck down. Do not use on open wounds or open sores. Avoid contact with your eyes, ears, mouth, and genitals (private parts). Wash face and genitals (private parts) with your normal soap.  6. Wash thoroughly, paying special attention to the area where your surgery will be performed.  7. Thoroughly rinse your body  with warm water.  8. Do not shower/wash with your normal soap after using and rinsing off the CHG soap.  9. Pat yourself dry with a clean towel.  10. Wear clean pajamas to bed the night before surgery.  12. Place clean sheets on your bed the night of your first shower and do not sleep with pets.  13. Shower again with the CHG soap on the day of surgery prior to arriving at the hospital.  14. Do not apply any deodorants/lotions/powders.  15. Please wear clean clothes to the hospital.

## 2023-07-07 NOTE — Telephone Encounter (Signed)
FYI

## 2023-07-07 NOTE — Telephone Encounter (Signed)
-----   Message from Verlee Monte sent at 07/07/2023  1:44 PM EDT ----- Regarding: Request for pre-operative cardiac clearance Request for pre-operative cardiac clearance:  1. What type of surgery is being performed?  BYPASS GRAFT FEMORAL-POPLITEAL ARTERY (FEMORAL-POSTERIOR TIBIAL BYPASS W/ SAPHENOUS VEIN  2. When is this surgery scheduled?  07/16/2023  3. Type of clearance being requested (medical, pharmacy, both)? MEDICAL   4. Are there any medications that need to be held prior to surgery? NONE. Will continue daily low dose ASA.   5. Practice name and name of physician performing surgery?  Performing surgeon: Dr. Levora Dredge, MD Requesting clearance: Melanie Mulling, FNP-C    6. Anesthesia type (none, local, MAC, general)? GENERAL  7. What is the office phone and fax number?   Phone: [phone] Fax: [phone]  ATTENTION: Unable to create telephone message as per your standard workflow. Directed by [facility] providers to send requests for cardiac clearance to this pool for appropriate distribution to provider covering pre-operative clearances.   Melanie Mulling, MSN, APRN, FNP-C, CEN [facility]  Peri-operative Services Nurse Practitioner Phone: [phone] 07/07/23 1:44 PM

## 2023-07-07 NOTE — Pre-Procedure Instructions (Signed)
Contacting Schnier's office Nurse Practitioner Sheppard Plumber has said the patient needs to stop the Brilinta 3 days before her surgery. The last dose will be Saturday October 12. Patient called and left message. Also left the office number [phone] in case she had any questions.

## 2023-07-07 NOTE — Telephone Encounter (Signed)
Dr. Mariah Milling , patient's chart was reviewed for preoperative cardiac evaluation.  She was seen by you on 06/17/2023 prior to LE angiography for PAD.  She is now pending  Fem-Pop with Dr. Gilda Crease on 07/16/2023,  She had a low risk NM stress test on 06/24/2023 and echo with normal EF and Grade II diastolic dysfunction. She is on Brilinta. According to protocol, we request that you comment on cardiac risk for upcoming procedure since office visit was less than 2 months ago.   Please route your response to p cv div preop.  Thank you, Joni Reining DNP, [facility]

## 2023-07-09 ENCOUNTER — Telehealth: Payer: Self-pay

## 2023-07-10 NOTE — Telephone Encounter (Signed)
Patient Name: Melanie Browning  DOB: Jan 13, 1941 MRN: [id number]  Primary Cardiologist: None  Chart reviewed as part of pre-operative protocol coverage. Given past medical history and time since last visit, based on ACC/AHA guidelines, Kathalyn Dinallo is at acceptable risk for the planned procedure without further cardiovascular testing.   Per Dr. Mariah Milling, who last saw pt on 06/17/2023, "Acceptable risk for vascular surgery No further cardiac testing needed Thx TGollan."  I will route this recommendation to the requesting party via Epic fax function and remove from pre-op pool.  Please call with questions.  Joylene Grapes, NP 07/10/2023, 4:07 PM

## 2023-07-11 ENCOUNTER — Encounter: Payer: Self-pay | Admitting: Vascular Surgery

## 2023-07-14 ENCOUNTER — Encounter: Payer: Self-pay | Admitting: Vascular Surgery

## 2023-07-14 NOTE — Progress Notes (Signed)
Perioperative / Anesthesia Services  Pre-Admission Testing Clinical Review / Pre-Operative Anesthesia Consult  Date: 07/15/23  Patient Demographics:  Name: Melanie Browning DOB:   [birth year] MRN:   [id number]  Planned Surgical Procedure(s):    Case: [id number] Date/Time: 07/16/23 0715   Procedures:      BYPASS GRAFT FEMORAL-POPLITEAL ARTERY (FEMORAL-POSTERIOR TIBIAL BYPASS W/ SAPHENOUS VEIN- [id number]) (Left)     APPLICATION OF CELL SAVER   Anesthesia type: General   Pre-op diagnosis: ASO WITH REST PAIN   Location: [facility] OR ROOM 08 / [facility]   Surgeons: Renford Dills, MD     NOTE: Available PAT nursing documentation and vital signs have been reviewed. Clinical nursing staff has updated patient's PMH/PSHx, current medication list, and drug allergies/intolerances to ensure comprehensive history available to assist in medical decision making as it pertains to the aforementioned surgical procedure and anticipated anesthetic course. Extensive review of available clinical information personally performed. [place] [facility] and PSHx updated with any diagnoses/procedures that  may have been inadvertently omitted during her intake with the pre-admission testing department's nursing staff.  Clinical Discussion:  Melanie Browning is a 82 y.o. female who is submitted for pre-surgical anesthesia review and clearance prior to her undergoing the above procedure. Patient has never been a smoker. Pertinent PMH includes: CAD, aortic stenosis, diastolic dysfunction, PAD with critical limb ischemia, aortic stenosis, HTN, HLD, hypothyroidism, LUL pulmonary nodule), macrocytic anemia, polymyalgia rheumatica, cervical spondylosis, OA.  Patient is followed by cardiology Mariah Milling, MD). She was last seen in the cardiology clinic on 06/17/2023; notes reviewed. At the time of her clinic visit, patient experiencing episodes of chest pain, exertional dyspnea, and claudication pain in her lower extremities.  Patient denied any PND, orthopnea, palpitations, significant peripheral edema, weakness, fatigue, vertiginous symptoms, or presyncope/syncope. Patient with a past medical history significant for cardiovascular diagnoses. Documented physical exam was grossly benign, providing no evidence of acute exacerbation and/or decompensation of the patient's known cardiovascular conditions.  Cardiac CTA was performed on 11/09/2020 revealing an elevated coronary calcium score of 539.  This placed patient in the 83rd percentile for age/sex/race matched control.  Study suggestive of a severe stenosis of the proximal LAD (70-99%), in addition to minimal stenosis of the distal RCA and proximal LCx (<25%).  Patient with normal coronary origin and RIGHT-sided dominance.  Cardiac catheterization was recommended.  Patient underwent diagnostic LEFT heart catheterization on 11/15/2020 revealing a 90% stenosis of the proximal LAD.  RCA was free of disease.  There was moderate disease with moderate calcification noted in the LM.  Subsequently PCI was performed placing a 2.75 x 18 mm Resolute Onyx DES x 1 proximal LAD lesion yielding excellent angiographic result and TIMI-3 flow.  Patient with a history of severe peripheral artery disease.  She has undergone multiple angioplasties and stenting procedures with vascular surgery for critical LEFT lower extremity ischemia.  Most recent vascular intervention occurred on 06/17/2023, at which time patient underwent mechanical thrombectomy of the LEFT SFA, left popliteal, and LEFT anterior tibial arteries.  Thrombectomy followed by PTA and stenting of the LEFT SFA and popliteal arteries.  Following procedure, vascular surgeon noted that following multiple procedures for limb salvage, more definitive bypass required in order to prevent reocclusion.  Given patient's extensive PVD, she is on daily DAPT therapy using low-dose ASA + ticagrelor.  Patient reportedly compliant with therapy with no  evidence reports of GI/GU related bleeding. Blood pressure well controlled at 110/68 mmHg on currently prescribed ARB (losartan) monotherapy.  Due to elevated LFTs earlier in the year, patient is not currently taking any type of lipid-lowering therapies for her HLD diagnosis and ASCVD prevention.  She does have a supply of short acting nitrates (NTG) to use for recurrent angina/anginal equivalent symptoms; denied recent use.  Patient is not diabetic. Patient does not have an OSAH diagnosis.  Functional capacity limited by patient's age, vascular disease, and other medical comorbidities.  With that said, patient is able to walk her 2 dogs without significant cardiovascular limitation.  Again, patient does experience some exertional dyspnea and episodes of nonspecific chest pain.  Patient is felt to be able to achieve at least 4 METS of physical activity without experiencing the symptoms.  In prior to upcoming vascular surgery, in the setting of known DOE and chest pain, cardiology requesting additional noninvasive evaluation of patient's symptoms.  No changes were made to her medication regimen.  Patient to follow-up with outpatient cardiology in 6 months or sooner if needed.  Since patient was seen in the office by cardiology, she has undergone the recommended noninvasive cardiovascular testing to further assess her exertional dyspnea and chest pain.  Testing results as follows:  Myocardial perfusion imaging study was performed on 06/24/2023 revealing a normal left ventricular systolic function with an EF of 60-65%.  Coronary artery calcifications/stent, aortic atherosclerosis, and aortic valve calcifications noted on the attenuation correction imaging.  Study revealed no evidence of stress-induced myocardial ischemia or arrhythmia; no scintigraphic evidence of scar.  Study determined to be normal and low risk.  TTE performed on 06/26/2023 revealed a normal left ventricular systolic function with an EF of  60-65%.  There were no regional wall motion abnormalities. Left ventricular diastolic Doppler parameters consistent with pseudonormalization (G2DD).  Right ventricular size and function normal.  There was severe calcification of the aortic valve with mild associated regurgitation.  Calcification/thickening resulting in mild to moderate aortic valve stenosis with a mean pressure gradient of 18.2 mmHg; V-max = 2.76 m/s; AVA (VTI) 0.73 cm.  Melanie Browning is scheduled for a BYPASS GRAFT FEMORAL-POPLITEAL ARTERY (FEMORAL-POSTERIOR TIBIAL BYPASS W/ SAPHENOUS VEIN on 07/16/2023 with Dr. Levora Dredge, MD.  Given patient's past medical history significant for cardiovascular diagnoses, presurgical cardiac clearance was sought by the PAT team. Per cardiology, "based ACC/AHA guidelines, the patient's past medical history, and the amount of time since her last clinic visit, this patient would be at an overall ACCEPTABLE risk for the planned procedure without further cardiovascular testing or intervention at this time".   Again, this patient is on daily DAPT therapy.  She has been instructed on recommendations for holding her ticagrelor for 3 days prior to her procedure with plans to restart as soon as postoperative bleeding risk felt to be minimized by her attending surgeon. The patient has been instructed that her last dose of her ticagrelor should be on 07/12/2023.  Patient will continue her daily low-dose ASA throughout her perioperative course per directions from vascular surgery.  Patient denies previous perioperative complications with anesthesia in the past. In review of the available records, it is noted that patient underwent a general anesthetic course here at [facility] (ASA II) in 09/2020 without documented complications.      07/07/2023    9:10 AM 06/18/2023    7:53 AM 06/18/2023    3:04 AM  Vitals with BMI  Height 5\' 8"     Weight 155 lbs 2 oz    BMI 23.59     Systolic 128 164  168  Diastolic 66 80 70  Pulse 80 85 52    Providers/Specialists:   NOTE: Primary physician provider listed below. Patient may have been seen by APP or partner within same practice.   PROVIDER ROLE / SPECIALTY LAST OV  Schnier, Latina Craver, MD Vascular Surgery (Surgeon) 06/17/2023  Mick Sell, MD Primary Care Provider 05/30/2023  Julien Nordmann, MD Cardiology 06/17/2023; update preop APP call on 07/10/2023  Bea Graff, MD Endocrinology 11/27/2022  Leida Lauth, MD GYN Oncology 03/19/2023  Rickard Patience, MD Medical Oncology 01/31/2023   Allergies:  Lactose  Current Home Medications:   No current facility-administered medications for this encounter.    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)    a.) multiple vascular interventions (PTA, stenting, arthrectomy, and thrombectomy) of LEFT SFA, LEFT popliteal, LEFT posterior tibial, and LEFT anterior tibial arteries due to critical limb ischemia   CAD (coronary artery disease) 11/09/2020   a.) cCTA 11/09/2020: Ca2+ 539 (83rd %'ile for age/sex/race match control); <25% dRCA & pLCx, >70% pLAD; b.) LHC/PCI 11/15/2020: 90% pLAD (2.75 x 18 mm Resolute Onyx DES); c.) MV 06/26/2023: no sig ischemia   Cervical spondylosis    Colitis    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)    a.) s/p multiple  vascular interventions   Diastolic dysfunction 06/26/2023   a.) TTE 06/26/2023: EF 60-65%, no RWMAs, G2DD, sev AoV thickening with mild AR, mild-mod AS (MPG 18.2 mmHg; AVA 0.73 cm2)   [place]   Dyslipidemia    History of cervical cancer    a.) s/p TAH   HPV (human papilloma virus) infection    Hyperlipidemia    Hypothyroidism    IGT (impaired glucose tolerance)    Left upper lobe pulmonary nodule    Long term current use of aspirin    Long term current use of ticagrelor therapy    Macrocytic anemia    Osteoporosis    a.) Tx'd with zolodronic acid (Reclast) infusions   PAD (peripheral artery disease) (HCC)    Polymyalgia rheumatica (HCC)    Skin cancer of nose    Transaminitis    Vaginal dysplasia    Past Surgical History:  Procedure Laterality Date   COLONOSCOPY WITH PROPOFOL N/A 10/04/2020   Procedure: COLONOSCOPY WITH PROPOFOL;  Surgeon: Toledo, Boykin Nearing, MD;  Location: [facility] ENDOSCOPY;  Service: Gastroenterology;  Laterality: N/A;   COLPOSCOPY     CORONARY STENT INTERVENTION N/A 11/15/2020   Procedure: CORONARY STENT INTERVENTION;  Surgeon: Alwyn Pea, MD;  Location: [facility] INVASIVE CV LAB;  Service: Cardiovascular;  Laterality: N/A;   KNEE ARTHROSCOPY Left    LAPAROSCOPIC HYSTERECTOMY     LEFT HEART CATH AND CORONARY ANGIOGRAPHY N/A 11/15/2020   Procedure: LEFT HEART CATH AND CORONARY ANGIOGRAPHY;  Surgeon: Juliann Pares,  Bobbie Stack, MD;  Location: [facility] INVASIVE CV LAB;  Service: Cardiovascular;  Laterality: N/A;   LOWER EXTREMITY ANGIOGRAPHY Left 09/05/2020   Procedure: LOWER EXTREMITY ANGIOGRAPHY;  Surgeon: Renford Dills, MD;  Location: [facility] INV[facility] LAB;  Service: Cardiovascular;  Laterality: Left;   LOWER EXTREMITY ANGIOGRAPHY Left 04/10/2021   Procedure: LOWER EXTREMITY ANGIOGRAPHY;  Surgeon: Renford Dills, MD;  Location: [facility];  Service: Cardiovascular;  Laterality: Left;   LOWER EXTREMITY ANGIOGRAPHY  Left 11/20/2021   Procedure: Lower Extremity Angiography;  Surgeon: Renford Dills, MD;  Location: [facility] LAB;  Service: Cardiovascular;  Laterality: Left;   LOWER EXTREMITY ANGIOGRAPHY Left 06/05/2022   Procedure: Lower Extremity Angiography;  Surgeon: Renford Dills, MD;  Location: [facility];  Service: Cardiovascular;  Laterality: Left;   LOWER EXTREMITY ANGIOGRAPHY Left 07/23/2022   Procedure: Lower Extremity Angiography;  Surgeon: Renford Dills, MD;  Location: [facility];  Service: Cardiovascular;  Laterality: Left;   LOWER EXTREMITY ANGIOGRAPHY Left 12/27/2022   Procedure: Lower Extremity Angiography;  Surgeon: Renford Dills, MD;  Location: [facility] LAB;  Service: Cardiovascular;  Laterality: Left;   LOWER EXTREMITY ANGIOGRAPHY Left 06/17/2023   Procedure: Lower Extremity Angiography;  Surgeon: Renford Dills, MD;  Location: [facility] INVAS[facility] CV LAB;  Service: Cardiovascular;  Laterality: Left;   TOTAL ABDOMINAL HYSTERECTOMY     Family History  Problem Relation Age of Onset   Cancer Mother    Pancreatic cancer Mother    Heart failure Mother    Heart attack Father 31   Heart disease Father    Social History   Tobacco Use   Smoking status: Never   Smokeless tobacco: Never  Vaping Use   Vaping status: Never Used  Substance Use Topics   Alcohol use: Yes    Alcohol/week: 1.0 standard drink of alcohol    Types: 1 Glasses of wine per week   Drug use: Never    Pertinent Clinical Results:  LABS:   No visits with results within 3 Day(s) from this visit.  Latest known visit with results is:  Hospital Outpatient Visit on 07/07/2023  Component Date Value Ref Range Status   Sodium 07/07/2023 133 (L)  135 - 145 mmol/L Final   Potassium 07/07/2023 3.8  3.5 - 5.1 mmol/L Final   Chloride 07/07/2023 103  98 - 111 mmol/L Final   CO2 07/07/2023 24  22 - 32 mmol/L Final   Glucose, Bld 07/07/2023 102 (H)  70 - 99 mg/dL Final    Glucose reference range applies only to samples taken after fasting for at least 8 hours.   BUN 07/07/2023 17  8 - 23 mg/dL Final   Creatinine, Ser 07/07/2023 0.59  0.44 - 1.00 mg/dL Final   Calcium 66/44/0347 8.9  8.9 - 10.3 mg/dL Final   GFR, Estimated 07/07/2023 >60  >60 mL/min Final   Comment: (NOTE) Calculated using the CKD-EPI Creatinine Equation (2021)    Anion gap 07/07/2023 6  5 - 15 Final   Performed at [facility], [address] [place], [place] [address]   MRSA, PCR 07/07/2023 NEGATIVE  NEGATIVE Final   Staphylococcus aureus 07/07/2023 NEGATIVE  NEGATIVE Final   Comment: (NOTE) The Xpert SA Assay (FDA approved for NASAL specimens in patients 63 years of age and older), is one component of a comprehensive surveillance program. It is not intended to diagnose infection nor to guide or monitor treatment. Performed at [facility], [address] [place], [place] [address]     ECG: Date: 06/17/2023 Time ECG obtained: 0851 AM Rate: 75 bpm Rhythm: normal sinus Axis (leads I and aVF): Normal Intervals: PR 178 ms. QRS 74 ms. QTc 433 ms. ST segment and T wave changes: No evidence of acute ST segment elevation or depression.  Comparison: Similar to previous tracing obtained on 10/01/2022   IMAGING / PROCEDURES: TRANSTHORACIC ECHOCARDIOGRAM performed on 06/26/2023 Left ventricular ejection fraction, by estimation, is 60 to 65%. The left ventricle has normal function. The left ventricle has no regional wall motion abnormalities. Left ventricular diastolic parameters are consistent with Grade II diastolic dysfunction (pseudonormalization).  Right ventricular systolic function is normal. The right ventricular size is normal.  The mitral valve is normal in structure. No evidence of mitral valve regurgitation. No evidence of mitral stenosis.  The aortic valve is normal in structure. There is severe calcification of the aortic valve. There is severe thickening  of the aortic valve. Aortic valve regurgitation is mild. Mild to moderate aortic valve stenosis. Aortic regurgitation PHT measures 337 msec. Aortic valve mean gradient measures 18.2 mmHg. Aortic valve Vmax measures 2.76 m/s.  The inferior vena cava is normal in size with greater than 50% respiratory variability, suggesting right atrial pressure of 3 mmHg.   MYOCARDIAL PERFUSION IMAGING STUDY (LEXISCAN) performed on 09/24/204 Normal pharmacologic myocardial perfusion stress test without evidence of significant ischemia or scar. Left ventricular systolic function is normal (LVEF 60-65%). Coronary artery calcification/stent, aortic atherosclerosis, and aortic valve calcifications noted on the attenuation correction CT. This is a low risk study.  LEFT HEART CATHETERIZATION AND CORONARY ANGIOGRAPHY performed on 11/15/2020 Normal left ventricular systolic function 90% stenosis of the proximal LAD PCI performed placing a 2.75 x 18 mm resolute Onyx DES to the proximal LAD yielding excellent angiographic result and TIMI-3 flow. Recommendations: patient to be maintained on aspirin and Plavix indefinitely   CT CORONARY MORPH W/CTA COR W/SCORE W/CA W/CM &/OR WO/CM performed on 11/09/2020 Coronary calcium score of 539. This was 83rd percentile for age and sex matched control. Normal coronary origin with right dominance. Calcified plaque in the proximal LAD causing severe stenosis. Calcified plaque causing minimal stenosis in the distal RCA and proximal LCx. CAD-RADS 4 Severe stenosis. (70-99% or > 50% left main). Cardiac catheterization or CT FFR is recommended. Consider symptom-guided anti-ischemic pharmacotherapy as well as risk factor modification per guideline directed care. 3-4 mm LEFT upper lobe pulmonary nodule. No follow-up needed if patient is low-risk. Non-contrast chest CT can be considered in 12 months if patient is high-risk. This recommendation follows the consensus statement: Guidelines for  Management of Incidental Pulmonary Nodules Detected on CT Images: From the Fleischner Society 2017; Radiology 2017; 284:228-243. Aortic atherosclerosis.  Impression and Plan:  Melanie Browning has been referred for pre-anesthesia review and clearance prior to her undergoing the planned anesthetic and procedural courses. Available labs, pertinent testing, and imaging results were personally reviewed by me in preparation for upcoming operative/procedural course. [facility] medical record has been updated following extensive record review and patient interview with PAT staff.   This patient has been appropriately cleared by cardiology with an overall ACCEPTABLE risk of experiencing significant perioperative cardiovascular complications. Based on clinical review performed today (07/15/23), barring any significant acute changes in the patient's overall condition, it is anticipated that she will be able to proceed with the planned surgical intervention. Any acute changes in clinical condition may necessitate her procedure being postponed and/or cancelled. Patient will meet  with anesthesia team (MD and/or CRNA) on the day of her procedure for preoperative evaluation/assessment. Questions regarding anesthetic course will be fielded at that time.   Pre-surgical instructions were reviewed with the patient during her PAT appointment, and questions were fielded to satisfaction by PAT clinical staff. She has been instructed on which medications that she will need to hold prior to surgery, as well as the ones that have been deemed safe/appropriate to take on the day of her procedure. As part of the general education provided by PAT, patient made aware both verbally and in writing, that she would need to abstain from the use of any illegal substances during her perioperative course.  She was advised that failure to follow the provided instructions could necessitate case cancellation or result in serious perioperative  complications up to and including death. Patient encouraged to contact PAT and/or her surgeon's office to discuss any questions or concerns that may arise prior to surgery; verbalized understanding.   Quentin Mulling, MSN, APRN, FNP-C, CEN [facility]  Perioperative Services Nurse Practitioner Phone: [phone] Fax: [phone] 07/15/23 9:08 AM  NOTE: This note has been prepared using Dragon dictation software. Despite my best ability to proofread, there is always the potential that unintentional transcriptional errors may still occur from this process.

## 2023-07-16 ENCOUNTER — Inpatient Hospital Stay: Payer: Medicare Other | Admitting: Urgent Care

## 2023-07-16 ENCOUNTER — Encounter
Admission: RE | Disposition: A | Discharge status: Home / Self Care | Payer: Self-pay | Source: Skilled Nursing Facility | Attending: Vascular Surgery

## 2023-07-16 ENCOUNTER — Inpatient Hospital Stay
Admission: RE | Admit: 2023-07-16 | Discharge: 2023-07-25 | DRG: 254 | Disposition: A | Discharge status: Home / Self Care | Payer: Medicare Other | Source: Skilled Nursing Facility | Attending: Vascular Surgery | Admitting: Vascular Surgery

## 2023-07-16 ENCOUNTER — Other Ambulatory Visit: Payer: Self-pay

## 2023-07-16 ENCOUNTER — Encounter: Payer: Self-pay | Admitting: Vascular Surgery

## 2023-07-16 DIAGNOSIS — Z9889 Other specified postprocedural states: Secondary | ICD-10-CM | POA: Diagnosis not present

## 2023-07-16 DIAGNOSIS — Z91011 Allergy to milk products: Secondary | ICD-10-CM

## 2023-07-16 DIAGNOSIS — Z66 Do not resuscitate: Secondary | ICD-10-CM | POA: Diagnosis present

## 2023-07-16 DIAGNOSIS — I10 Essential (primary) hypertension: Secondary | ICD-10-CM | POA: Diagnosis present

## 2023-07-16 DIAGNOSIS — Z01812 Encounter for preprocedural laboratory examination: Principal | ICD-10-CM

## 2023-07-16 DIAGNOSIS — E039 Hypothyroidism, unspecified: Secondary | ICD-10-CM | POA: Diagnosis present

## 2023-07-16 DIAGNOSIS — Z79899 Other long term (current) drug therapy: Secondary | ICD-10-CM | POA: Diagnosis not present

## 2023-07-16 DIAGNOSIS — Z8249 Family history of ischemic heart disease and other diseases of the circulatory system: Secondary | ICD-10-CM

## 2023-07-16 DIAGNOSIS — I739 Peripheral vascular disease, unspecified: Secondary | ICD-10-CM

## 2023-07-16 DIAGNOSIS — I700 Atherosclerosis of aorta: Secondary | ICD-10-CM | POA: Diagnosis present

## 2023-07-16 DIAGNOSIS — Z800 Family history of malignant neoplasm of digestive organs: Secondary | ICD-10-CM

## 2023-07-16 DIAGNOSIS — M353 Polymyalgia rheumatica: Secondary | ICD-10-CM | POA: Diagnosis present

## 2023-07-16 DIAGNOSIS — Z79890 Hormone replacement therapy (postmenopausal): Secondary | ICD-10-CM | POA: Diagnosis not present

## 2023-07-16 DIAGNOSIS — Z8541 Personal history of malignant neoplasm of cervix uteri: Secondary | ICD-10-CM

## 2023-07-16 DIAGNOSIS — R911 Solitary pulmonary nodule: Secondary | ICD-10-CM | POA: Diagnosis present

## 2023-07-16 DIAGNOSIS — Z85828 Personal history of other malignant neoplasm of skin: Secondary | ICD-10-CM

## 2023-07-16 DIAGNOSIS — I70222 Atherosclerosis of native arteries of extremities with rest pain, left leg: Secondary | ICD-10-CM | POA: Diagnosis not present

## 2023-07-16 DIAGNOSIS — E785 Hyperlipidemia, unspecified: Secondary | ICD-10-CM | POA: Diagnosis present

## 2023-07-16 DIAGNOSIS — I70223 Atherosclerosis of native arteries of extremities with rest pain, bilateral legs: Secondary | ICD-10-CM | POA: Diagnosis present

## 2023-07-16 DIAGNOSIS — Z955 Presence of coronary angioplasty implant and graft: Secondary | ICD-10-CM | POA: Diagnosis not present

## 2023-07-16 DIAGNOSIS — M810 Age-related osteoporosis without current pathological fracture: Secondary | ICD-10-CM | POA: Diagnosis present

## 2023-07-16 DIAGNOSIS — Z7902 Long term (current) use of antithrombotics/antiplatelets: Secondary | ICD-10-CM

## 2023-07-16 DIAGNOSIS — Z79818 Long term (current) use of other agents affecting estrogen receptors and estrogen levels: Secondary | ICD-10-CM | POA: Diagnosis not present

## 2023-07-16 DIAGNOSIS — I350 Nonrheumatic aortic (valve) stenosis: Secondary | ICD-10-CM | POA: Diagnosis present

## 2023-07-16 DIAGNOSIS — I70229 Atherosclerosis of native arteries of extremities with rest pain, unspecified extremity: Secondary | ICD-10-CM | POA: Diagnosis present

## 2023-07-16 DIAGNOSIS — I872 Venous insufficiency (chronic) (peripheral): Secondary | ICD-10-CM | POA: Diagnosis present

## 2023-07-16 DIAGNOSIS — I25118 Atherosclerotic heart disease of native coronary artery with other forms of angina pectoris: Secondary | ICD-10-CM | POA: Diagnosis present

## 2023-07-16 DIAGNOSIS — Z7982 Long term (current) use of aspirin: Secondary | ICD-10-CM | POA: Diagnosis not present

## 2023-07-16 HISTORY — DX: Atherosclerosis of native arteries of extremities with rest pain, left leg: I70.222

## 2023-07-16 HISTORY — DX: Solitary pulmonary nodule: R91.1

## 2023-07-16 HISTORY — DX: Other long term (current) drug therapy: Z79.899

## 2023-07-16 HISTORY — DX: Unspecified malignant neoplasm of skin of nose: C44.301

## 2023-07-16 HISTORY — DX: Long term (current) use of aspirin: Z79.82

## 2023-07-16 HISTORY — DX: Atherosclerosis of aorta: I70.0

## 2023-07-16 HISTORY — PX: FEMORAL-POPLITEAL BYPASS GRAFT: SHX937

## 2023-07-16 LAB — CREATININE, SERUM
Creatinine, Ser: 0.65 mg/dL (ref 0.44–1.00)
GFR, Estimated: >60 mL/min (ref >60)

## 2023-07-16 LAB — CBC
HCT: 23.4 % — ABNORMAL LOW (ref 36.0–46.0)
Hemoglobin: 7.8 g/dL — ABNORMAL LOW (ref 12.0–15.0)
MCH: 33.1 pg (ref 26.0–34.0)
MCHC: 33.3 g/dL (ref 30.0–36.0)
MCV: 99.2 fL (ref 80.0–100.0)
Platelets: 133 10*3/uL — ABNORMAL LOW (ref 150–400)
RBC: 2.36 MIL/uL — ABNORMAL LOW (ref 3.87–5.11)
RDW: 13.7 % (ref 11.5–15.5)
WBC: 10.6 10*3/uL — ABNORMAL HIGH (ref 4.0–10.5)
nRBC: 0 % (ref 0.0–0.2)

## 2023-07-16 LAB — ABO/RH: ABO/RH(D): A POS

## 2023-07-16 LAB — GLUCOSE, CAPILLARY: Glucose-Capillary: 115 mg/dL — ABNORMAL HIGH (ref 70–99)

## 2023-07-16 SURGERY — BYPASS GRAFT FEMORAL-POPLITEAL ARTERY
Anesthesia: General

## 2023-07-16 MED ORDER — HYDRALAZINE HCL 20 MG/ML IJ SOLN: 5.0000 mg | INTRAMUSCULAR | Status: DC | PRN

## 2023-07-16 MED ORDER — ONDANSETRON HCL 4 MG/2ML IJ SOLN
INTRAMUSCULAR | Status: AC
  Filled 2023-07-16: qty 2

## 2023-07-16 MED ORDER — POTASSIUM CHLORIDE CRYS ER 20 MEQ PO TBCR: 20.0000 meq | EXTENDED_RELEASE_TABLET | Freq: Every day | ORAL | Status: DC | PRN

## 2023-07-16 MED ORDER — ONDANSETRON HCL 4 MG/2ML IJ SOLN
4.0000 mg | Freq: Four times a day (QID) | INTRAMUSCULAR | Status: DC | PRN
  Administered 2023-07-20 – 2023-07-21 (×4): 4 mg via INTRAVENOUS
  Filled 2023-07-16 (×5): qty 2

## 2023-07-16 MED ORDER — ASPIRIN 81 MG PO TBEC
81.0000 mg | DELAYED_RELEASE_TABLET | Freq: Every evening | ORAL | Status: DC
  Administered 2023-07-16 – 2023-07-24 (×9): 81 mg via ORAL
  Filled 2023-07-16 (×10): qty 1

## 2023-07-16 MED ORDER — OXYCODONE HCL 5 MG/5ML PO SOLN: 5.0000 mg | Freq: Once | ORAL | Status: DC | PRN

## 2023-07-16 MED ORDER — FENTANYL CITRATE (PF) 100 MCG/2ML IJ SOLN
INTRAMUSCULAR | Status: DC | PRN
  Administered 2023-07-16: 50 ug via INTRAVENOUS
  Administered 2023-07-16 (×2): 25 ug via INTRAVENOUS

## 2023-07-16 MED ORDER — PHENYLEPHRINE 80 MCG/ML (10ML) SYRINGE FOR IV PUSH (FOR BLOOD PRESSURE SUPPORT)
PREFILLED_SYRINGE | INTRAVENOUS | Status: AC
  Filled 2023-07-16: qty 10

## 2023-07-16 MED ORDER — DEXAMETHASONE SODIUM PHOSPHATE 10 MG/ML IJ SOLN
INTRAMUSCULAR | Status: DC | PRN
  Administered 2023-07-16: 6 mg via INTRAVENOUS

## 2023-07-16 MED ORDER — SUGAMMADEX SODIUM 200 MG/2ML IV SOLN
INTRAVENOUS | Status: DC | PRN
  Administered 2023-07-16: 140.8 mg via INTRAVENOUS

## 2023-07-16 MED ORDER — ONDANSETRON HCL 4 MG/2ML IJ SOLN
INTRAMUSCULAR | Status: DC | PRN
  Administered 2023-07-16: 4 mg via INTRAVENOUS

## 2023-07-16 MED ORDER — CEFAZOLIN SODIUM-DEXTROSE 2-4 GM/100ML-% IV SOLN
2.0000 g | INTRAVENOUS | Status: AC
  Administered 2023-07-16: 2 g via INTRAVENOUS

## 2023-07-16 MED ORDER — SODIUM CHLORIDE 0.9 % IV SOLN
INTRAVENOUS | Status: DC | PRN
  Administered 2023-07-16: 500 mL

## 2023-07-16 MED ORDER — ORAL CARE MOUTH RINSE: 15.0000 mL | Freq: Once | OROMUCOSAL | Status: AC

## 2023-07-16 MED ORDER — FAMOTIDINE 20 MG PO TABS
ORAL_TABLET | ORAL | Status: AC
  Filled 2023-07-16: qty 1

## 2023-07-16 MED ORDER — ROCURONIUM BROMIDE 10 MG/ML (PF) SYRINGE
PREFILLED_SYRINGE | INTRAVENOUS | Status: AC
  Filled 2023-07-16: qty 10

## 2023-07-16 MED ORDER — CHLORHEXIDINE GLUCONATE 0.12 % MT SOLN
15.0000 mL | Freq: Once | OROMUCOSAL | Status: AC
  Administered 2023-07-16: 15 mL via OROMUCOSAL

## 2023-07-16 MED ORDER — ONDANSETRON HCL 4 MG/2ML IJ SOLN: 4.0000 mg | Freq: Four times a day (QID) | INTRAMUSCULAR | Status: DC | PRN

## 2023-07-16 MED ORDER — PROPOFOL 10 MG/ML IV BOLUS
INTRAVENOUS | Status: DC | PRN
  Administered 2023-07-16: 120 mg via INTRAVENOUS
  Administered 2023-07-16: 30 mg via INTRAVENOUS

## 2023-07-16 MED ORDER — FIBRIN SEALANT 2 ML SINGLE DOSE KIT
PACK | CUTANEOUS | Status: DC | PRN
  Administered 2023-07-16: 10 mL via TOPICAL

## 2023-07-16 MED ORDER — LIDOCAINE HCL (PF) 2 % IJ SOLN
INTRAMUSCULAR | Status: AC
  Filled 2023-07-16: qty 5

## 2023-07-16 MED ORDER — ROCURONIUM BROMIDE 100 MG/10ML IV SOLN
INTRAVENOUS | Status: DC | PRN
  Administered 2023-07-16: 10 mg via INTRAVENOUS
  Administered 2023-07-16: 40 mg via INTRAVENOUS

## 2023-07-16 MED ORDER — DEXAMETHASONE SODIUM PHOSPHATE 10 MG/ML IJ SOLN
INTRAMUSCULAR | Status: AC
  Filled 2023-07-16: qty 1

## 2023-07-16 MED ORDER — CHLORHEXIDINE GLUCONATE CLOTH 2 % EX PADS
6.0000 | MEDICATED_PAD | Freq: Every day | CUTANEOUS | Status: DC
  Administered 2023-07-16 – 2023-07-25 (×8): 6 via TOPICAL

## 2023-07-16 MED ORDER — ALUM & MAG HYDROXIDE-SIMETH 200-200-20 MG/5ML PO SUSP: 15.0000 mL | ORAL | Status: DC | PRN

## 2023-07-16 MED ORDER — CHLORHEXIDINE GLUCONATE CLOTH 2 % EX PADS: 6.0000 | MEDICATED_PAD | Freq: Once | CUTANEOUS | Status: DC

## 2023-07-16 MED ORDER — PHENOL 1.4 % MT LIQD: 1.0000 | OROMUCOSAL | Status: DC | PRN

## 2023-07-16 MED ORDER — VISTASEAL 10 ML SINGLE DOSE KIT
PACK | CUTANEOUS | Status: AC
  Filled 2023-07-16: qty 10

## 2023-07-16 MED ORDER — CEFAZOLIN SODIUM-DEXTROSE 2-4 GM/100ML-% IV SOLN
INTRAVENOUS | Status: AC
  Filled 2023-07-16: qty 100

## 2023-07-16 MED ORDER — GUAIFENESIN-DM 100-10 MG/5ML PO SYRP: 15.0000 mL | ORAL_SOLUTION | ORAL | Status: DC | PRN

## 2023-07-16 MED ORDER — METOPROLOL TARTRATE 5 MG/5ML IV SOLN: 2.0000 mg | INTRAVENOUS | Status: DC | PRN

## 2023-07-16 MED ORDER — PROPOFOL 10 MG/ML IV BOLUS
INTRAVENOUS | Status: AC
  Filled 2023-07-16: qty 20

## 2023-07-16 MED ORDER — MIDAZOLAM HCL 2 MG/2ML IJ SOLN
INTRAMUSCULAR | Status: DC | PRN
  Administered 2023-07-16: 2 mg via INTRAVENOUS

## 2023-07-16 MED ORDER — LIDOCAINE HCL (CARDIAC) PF 100 MG/5ML IV SOSY
PREFILLED_SYRINGE | INTRAVENOUS | Status: DC | PRN
  Administered 2023-07-16: 70 mg via INTRAVENOUS

## 2023-07-16 MED ORDER — OXYCODONE-ACETAMINOPHEN 5-325 MG PO TABS
1.0000 | ORAL_TABLET | ORAL | Status: DC | PRN
  Administered 2023-07-16 (×2): 1 via ORAL
  Administered 2023-07-17 – 2023-07-22 (×20): 2 via ORAL
  Administered 2023-07-22: 1 via ORAL
  Administered 2023-07-22: 2 via ORAL
  Administered 2023-07-22: 1 via ORAL
  Administered 2023-07-22 – 2023-07-23 (×5): 2 via ORAL
  Administered 2023-07-24: 1 via ORAL
  Administered 2023-07-24 (×2): 2 via ORAL
  Administered 2023-07-24: 1 via ORAL
  Administered 2023-07-25 (×2): 2 via ORAL
  Filled 2023-07-16: qty 1
  Filled 2023-07-16 (×6): qty 2
  Filled 2023-07-16: qty 1
  Filled 2023-07-16 (×10): qty 2
  Filled 2023-07-16 (×2): qty 1
  Filled 2023-07-16 (×4): qty 2
  Filled 2023-07-16 (×2): qty 1
  Filled 2023-07-16 (×4): qty 2
  Filled 2023-07-16: qty 1
  Filled 2023-07-16: qty 2
  Filled 2023-07-16: qty 1
  Filled 2023-07-16 (×4): qty 2

## 2023-07-16 MED ORDER — ACETAMINOPHEN 325 MG PO TABS: 325.0000 mg | ORAL_TABLET | ORAL | Status: DC | PRN

## 2023-07-16 MED ORDER — MIDAZOLAM HCL 2 MG/2ML IJ SOLN
INTRAMUSCULAR | Status: AC
  Filled 2023-07-16: qty 2

## 2023-07-16 MED ORDER — SODIUM CHLORIDE 0.9 % IV SOLN: 500.0000 mL | Freq: Once | INTRAVENOUS | Status: DC | PRN

## 2023-07-16 MED ORDER — MAGNESIUM SULFATE 2 GM/50ML IV SOLN: 2.0000 g | Freq: Every day | INTRAVENOUS | Status: DC | PRN

## 2023-07-16 MED ORDER — ACETAMINOPHEN 10 MG/ML IV SOLN
INTRAVENOUS | Status: AC
  Filled 2023-07-16: qty 100

## 2023-07-16 MED ORDER — DIPHENOXYLATE-ATROPINE 2.5-0.025 MG PO TABS
1.0000 | ORAL_TABLET | Freq: Four times a day (QID) | ORAL | Status: DC | PRN
  Administered 2023-07-20 (×2): 1 via ORAL
  Filled 2023-07-16 (×2): qty 1

## 2023-07-16 MED ORDER — TICAGRELOR 90 MG PO TABS
90.0000 mg | ORAL_TABLET | Freq: Two times a day (BID) | ORAL | Status: DC
  Administered 2023-07-17 – 2023-07-25 (×17): 90 mg via ORAL
  Filled 2023-07-16 (×16): qty 1

## 2023-07-16 MED ORDER — HEPARIN SODIUM (PORCINE) 1000 UNIT/ML IJ SOLN
INTRAMUSCULAR | Status: DC | PRN
Start: 2023-07-16 — End: 2023-07-16
  Administered 2023-07-16: 6000 [IU] via INTRAVENOUS

## 2023-07-16 MED ORDER — ENOXAPARIN SODIUM 40 MG/0.4ML IJ SOSY
40.0000 mg | PREFILLED_SYRINGE | INTRAMUSCULAR | Status: DC
  Administered 2023-07-17 – 2023-07-22 (×6): 40 mg via SUBCUTANEOUS
  Filled 2023-07-16 (×6): qty 0.4

## 2023-07-16 MED ORDER — SENNOSIDES-DOCUSATE SODIUM 8.6-50 MG PO TABS
1.0000 | ORAL_TABLET | Freq: Every evening | ORAL | Status: DC | PRN
  Administered 2023-07-23: 1 via ORAL
  Filled 2023-07-16: qty 1

## 2023-07-16 MED ORDER — CEFAZOLIN SODIUM-DEXTROSE 2-4 GM/100ML-% IV SOLN
2.0000 g | Freq: Three times a day (TID) | INTRAVENOUS | Status: AC
  Administered 2023-07-16 (×2): 2 g via INTRAVENOUS
  Filled 2023-07-16 (×2): qty 100

## 2023-07-16 MED ORDER — HEPARIN SODIUM (PORCINE) 5000 UNIT/ML IJ SOLN
INTRAMUSCULAR | Status: AC
  Filled 2023-07-16: qty 1

## 2023-07-16 MED ORDER — HEPARIN SODIUM (PORCINE) 1000 UNIT/ML IJ SOLN
INTRAMUSCULAR | Status: AC
  Filled 2023-07-16: qty 10

## 2023-07-16 MED ORDER — CHLORHEXIDINE GLUCONATE 0.12 % MT SOLN
OROMUCOSAL | Status: AC
  Filled 2023-07-16: qty 15

## 2023-07-16 MED ORDER — LACTATED RINGERS IV SOLN: INTRAVENOUS | Status: DC | PRN

## 2023-07-16 MED ORDER — HEPARIN 30,000 UNITS/1000 ML (OHS) CELLSAVER SOLUTION
Status: AC
  Filled 2023-07-16: qty 1000

## 2023-07-16 MED ORDER — SODIUM CHLORIDE 0.9% FLUSH: 10.0000 mL | Freq: Two times a day (BID) | INTRAVENOUS | Status: DC

## 2023-07-16 MED ORDER — LACTATED RINGERS IV SOLN: 10.0000 mL/h | INTRAVENOUS | Status: DC

## 2023-07-16 MED ORDER — FENTANYL CITRATE (PF) 100 MCG/2ML IJ SOLN
INTRAMUSCULAR | Status: AC
  Filled 2023-07-16: qty 2

## 2023-07-16 MED ORDER — HEMOSTATIC AGENTS (NO CHARGE) OPTIME
TOPICAL | Status: DC | PRN
  Administered 2023-07-16 (×2): 1 via TOPICAL

## 2023-07-16 MED ORDER — DOCUSATE SODIUM 100 MG PO CAPS
100.0000 mg | ORAL_CAPSULE | Freq: Every day | ORAL | Status: DC
  Administered 2023-07-17 – 2023-07-25 (×5): 100 mg via ORAL
  Filled 2023-07-16 (×9): qty 1

## 2023-07-16 MED ORDER — ACETAMINOPHEN 325 MG RE SUPP: 325.0000 mg | RECTAL | Status: DC | PRN

## 2023-07-16 MED ORDER — PHENYLEPHRINE HCL-NACL 20-0.9 MG/250ML-% IV SOLN
INTRAVENOUS | Status: AC
  Filled 2023-07-16: qty 250

## 2023-07-16 MED ORDER — LEVOTHYROXINE SODIUM 50 MCG PO TABS
75.0000 ug | ORAL_TABLET | Freq: Every day | ORAL | Status: DC
  Administered 2023-07-17 – 2023-07-25 (×9): 75 ug via ORAL
  Filled 2023-07-16 (×9): qty 1

## 2023-07-16 MED ORDER — FAMOTIDINE IN NACL 20-0.9 MG/50ML-% IV SOLN
20.0000 mg | Freq: Two times a day (BID) | INTRAVENOUS | Status: DC
  Filled 2023-07-16: qty 50

## 2023-07-16 MED ORDER — HEPARIN 30,000 UNITS/1000 ML (OHS) CELLSAVER SOLUTION
Status: AC | PRN
  Administered 2023-07-16: 1

## 2023-07-16 MED ORDER — FAMOTIDINE 20 MG PO TABS
20.0000 mg | ORAL_TABLET | Freq: Once | ORAL | Status: AC
  Administered 2023-07-16: 20 mg via ORAL

## 2023-07-16 MED ORDER — SODIUM CHLORIDE 0.9 % IV SOLN: INTRAVENOUS | Status: DC

## 2023-07-16 MED ORDER — MORPHINE SULFATE (PF) 2 MG/ML IV SOLN
2.0000 mg | INTRAVENOUS | Status: DC | PRN
  Administered 2023-07-16 (×2): 2 mg via INTRAVENOUS
  Administered 2023-07-16: 4 mg via INTRAVENOUS
  Administered 2023-07-17 – 2023-07-22 (×3): 2 mg via INTRAVENOUS
  Administered 2023-07-22: 4 mg via INTRAVENOUS
  Administered 2023-07-22 (×2): 2 mg via INTRAVENOUS
  Filled 2023-07-16 (×6): qty 1
  Filled 2023-07-16 (×2): qty 2
  Filled 2023-07-16: qty 1

## 2023-07-16 MED ORDER — HYDROMORPHONE HCL 1 MG/ML IJ SOLN: 1.0000 mg | Freq: Once | INTRAMUSCULAR | Status: DC | PRN

## 2023-07-16 MED ORDER — NITROGLYCERIN 0.4 MG SL SUBL: 0.4000 mg | SUBLINGUAL_TABLET | SUBLINGUAL | Status: DC | PRN

## 2023-07-16 MED ORDER — LOSARTAN POTASSIUM 25 MG PO TABS
25.0000 mg | ORAL_TABLET | Freq: Every day | ORAL | Status: DC
  Administered 2023-07-16 – 2023-07-25 (×10): 25 mg via ORAL
  Filled 2023-07-16 (×10): qty 1

## 2023-07-16 MED ORDER — SORBITOL 70 % SOLN: 30.0000 mL | Freq: Every day | Status: DC | PRN

## 2023-07-16 MED ORDER — GLYCOPYRROLATE 0.2 MG/ML IJ SOLN
INTRAMUSCULAR | Status: AC
  Filled 2023-07-16: qty 1

## 2023-07-16 MED ORDER — DOPAMINE-DEXTROSE 3.2-5 MG/ML-% IV SOLN: 3.0000 ug/kg/min | INTRAVENOUS | Status: DC

## 2023-07-16 MED ORDER — PHENYLEPHRINE 80 MCG/ML (10ML) SYRINGE FOR IV PUSH (FOR BLOOD PRESSURE SUPPORT)
PREFILLED_SYRINGE | INTRAVENOUS | Status: DC | PRN
  Administered 2023-07-16: 40 ug via INTRAVENOUS
  Administered 2023-07-16: 80 ug via INTRAVENOUS
  Administered 2023-07-16: 120 ug via INTRAVENOUS
  Administered 2023-07-16: 160 ug via INTRAVENOUS
  Administered 2023-07-16 (×3): 80 ug via INTRAVENOUS

## 2023-07-16 MED ORDER — NITROGLYCERIN IN D5W 200-5 MCG/ML-% IV SOLN: 5.0000 ug/min | INTRAVENOUS | Status: DC

## 2023-07-16 MED ORDER — TICAGRELOR 90 MG PO TABS
90.0000 mg | ORAL_TABLET | Freq: Two times a day (BID) | ORAL | Status: DC
  Administered 2023-07-16: 90 mg via ORAL
  Filled 2023-07-16 (×2): qty 1

## 2023-07-16 MED ORDER — PROPOFOL 1000 MG/100ML IV EMUL
INTRAVENOUS | Status: AC
  Filled 2023-07-16: qty 100

## 2023-07-16 MED ORDER — PHENYLEPHRINE HCL-NACL 20-0.9 MG/250ML-% IV SOLN
INTRAVENOUS | Status: DC | PRN
  Administered 2023-07-16: 40 ug/min via INTRAVENOUS

## 2023-07-16 MED ORDER — GLYCOPYRROLATE 0.2 MG/ML IJ SOLN
INTRAMUSCULAR | Status: DC | PRN
  Administered 2023-07-16: .1 mg via INTRAVENOUS

## 2023-07-16 MED ORDER — LABETALOL HCL 5 MG/ML IV SOLN: 10.0000 mg | INTRAVENOUS | Status: DC | PRN

## 2023-07-16 MED ORDER — OXYCODONE HCL 5 MG PO TABS: 5.0000 mg | ORAL_TABLET | Freq: Once | ORAL | Status: DC | PRN

## 2023-07-16 MED ORDER — FENTANYL CITRATE (PF) 100 MCG/2ML IJ SOLN
25.0000 ug | INTRAMUSCULAR | Status: DC | PRN
  Administered 2023-07-16: 50 ug via INTRAVENOUS

## 2023-07-16 MED ORDER — ACETAMINOPHEN 10 MG/ML IV SOLN
1000.0000 mg | Freq: Once | INTRAVENOUS | Status: DC | PRN
  Administered 2023-07-16: 1000 mg via INTRAVENOUS

## 2023-07-16 SURGICAL SUPPLY — 91 items
5 PAIRS OF YELLOW SUTURE CLAMP (MISCELLANEOUS) ×4
ADH SKN CLS APL DERMABOND .7 (GAUZE/BANDAGES/DRESSINGS) ×4
AGENT HMST PWDR BTL CLGN 5GM (Miscellaneous) ×2 IMPLANT
APL PRP STRL LF DISP 70% ISPRP (MISCELLANEOUS) ×4
BAG COUNTER SPONGE SURGICOUNT (BAG) ×2 IMPLANT
BAG DECANTER FOR FLEXI CONT (MISCELLANEOUS) ×2 IMPLANT
BAG ISL LRG 20X20 DRWSTRG (DRAPES) ×2
BAG ISOLATATION DRAPE 20X20 ST (DRAPES) ×2 IMPLANT
BAG SPNG CNTER NS LX DISP (BAG) ×2
BLADE SURG 15 STRL LF DISP TIS (BLADE) ×2 IMPLANT
BLADE SURG 15 STRL SS (BLADE) ×2
BLADE SURG SZ11 CARB STEEL (BLADE) ×2 IMPLANT
BOOT SUTURE VASCULAR YLW (MISCELLANEOUS) ×4
BRUSH SCRUB EZ 4% CHG (MISCELLANEOUS) ×2 IMPLANT
CANISTER WOUND CARE 500ML ATS (WOUND CARE) IMPLANT
CAP TUBING WOUND VAC TRAC (MISCELLANEOUS) IMPLANT
CHLORAPREP W/TINT 26 (MISCELLANEOUS) ×4 IMPLANT
CLAMP SUTURE YELLOW 5 PAIRS (MISCELLANEOUS) ×4 IMPLANT
CLIP SPRNG 6 S-JAW DBL (CLIP) IMPLANT
CLIP SPRNG 6MM S-JAW DBL (CLIP) ×2
COLLAGEN CELLERATERX 5 GRAM (Miscellaneous) IMPLANT
DERMABOND ADVANCED .7 DNX12 (GAUZE/BANDAGES/DRESSINGS) ×4 IMPLANT
DRAPE INCISE IOBAN 66X45 STRL (DRAPES) ×4 IMPLANT
DRAPE LEGGINS SURG 28X43 STRL (DRAPES) IMPLANT
DRAPE SHEET LG 3/4 BI-LAMINATE (DRAPES) ×2 IMPLANT
DRESSING SURGICEL FIBRLLR 1X2 (HEMOSTASIS) ×2 IMPLANT
DRSG OPSITE POSTOP 4X10 (GAUZE/BANDAGES/DRESSINGS) IMPLANT
DRSG OPSITE POSTOP 4X12 (GAUZE/BANDAGES/DRESSINGS) IMPLANT
DRSG SURGICEL FIBRILLAR 1X2 (HEMOSTASIS) ×4
DRSG VAC GRANUFOAM LG (GAUZE/BANDAGES/DRESSINGS) IMPLANT
ELECT REM PT RETURN 9FT ADLT (ELECTROSURGICAL) ×4
ELECTRODE REM PT RTRN 9FT ADLT (ELECTROSURGICAL) ×2 IMPLANT
GAUZE 4X4 16PLY ~~LOC~~+RFID DBL (SPONGE) ×2 IMPLANT
GEL ULTRASOUND 20GR AQUASONIC (MISCELLANEOUS) IMPLANT
GLOVE BIO SURGEON STRL SZ7 (GLOVE) ×2 IMPLANT
GLOVE SURG SYN 7.0 (GLOVE) ×4 IMPLANT
GLOVE SURG SYN 7.0 PF PI (GLOVE) ×4 IMPLANT
GLOVE SURG SYN 8.0 (GLOVE) ×4 IMPLANT
GLOVE SURG SYN 8.0 PF PI (GLOVE) ×4 IMPLANT
GOWN STRL REUS W/ TWL LRG LVL3 (GOWN DISPOSABLE) ×8 IMPLANT
GOWN STRL REUS W/ TWL XL LVL3 (GOWN DISPOSABLE) ×4 IMPLANT
GOWN STRL REUS W/TWL 2XL LVL3 (GOWN DISPOSABLE) ×2 IMPLANT
GOWN STRL REUS W/TWL LRG LVL3 (GOWN DISPOSABLE) ×8
GOWN STRL REUS W/TWL XL LVL3 (GOWN DISPOSABLE) ×4
HEAD CUTTING 'VALVULOTOME URSL (MISCELLANEOUS) ×2 IMPLANT
IV NS 500ML (IV SOLUTION) ×2
IV NS 500ML BAXH (IV SOLUTION) ×2 IMPLANT
KIT PREVENA INCISION MGT 13 (CANNISTER) IMPLANT
KIT TURNOVER KIT A (KITS) ×2 IMPLANT
LABEL OR SOLS (LABEL) ×2 IMPLANT
LOOP VESSEL MAXI 1X406 RED (MISCELLANEOUS) ×6 IMPLANT
LOOP VESSEL MINI 0.8X406 BLUE (MISCELLANEOUS) ×4 IMPLANT
MANIFOLD NEPTUNE II (INSTRUMENTS) ×2 IMPLANT
NS IRRIG 1000ML POUR BTL (IV SOLUTION) ×2 IMPLANT
PACK BASIN MAJOR ARMC (MISCELLANEOUS) ×2 IMPLANT
PACK UNIVERSAL (MISCELLANEOUS) ×2 IMPLANT
PAD PREP OB/GYN DISP 24X41 (PERSONAL CARE ITEMS) ×2 IMPLANT
SET WALTER ACTIVATION W/DRAPE (SET/KITS/TRAYS/PACK) ×2 IMPLANT
SOLUTION CELL SAVER (CLIP) ×2 IMPLANT
SPONGE T-LAP 18X18 ~~LOC~~+RFID (SPONGE) ×6 IMPLANT
STAPLER SKIN PROX 35W (STAPLE) ×2 IMPLANT
SUT ETHIBOND CT1 BRD #0 30IN (SUTURE) IMPLANT
SUT MNCRL+ 5-0 UNDYED PC-3 (SUTURE) ×2 IMPLANT
SUT PROLENE 3 0 SH DA (SUTURE) ×2 IMPLANT
SUT PROLENE 5 0 RB 1 DA (SUTURE) IMPLANT
SUT PROLENE 6 0 BV (SUTURE) ×12 IMPLANT
SUT PROLENE 7 0 BV 1 (SUTURE) ×8 IMPLANT
SUT SILK 2 0 (SUTURE) ×4
SUT SILK 2 0 SH (SUTURE) ×2 IMPLANT
SUT SILK 2-0 18XBRD TIE 12 (SUTURE) ×2 IMPLANT
SUT SILK 3 0 (SUTURE) ×4
SUT SILK 3-0 18XBRD TIE 12 (SUTURE) ×2 IMPLANT
SUT SILK 4 0 (SUTURE) ×2
SUT SILK 4-0 18XBRD TIE 12 (SUTURE) ×2 IMPLANT
SUT VIC AB 2-0 CT1 (SUTURE) ×6 IMPLANT
SUT VIC AB 2-0 SH 27 (SUTURE)
SUT VIC AB 2-0 SH 27XBRD (SUTURE) IMPLANT
SUT VIC AB 3-0 SH 27 (SUTURE) ×4
SUT VIC AB 3-0 SH 27X BRD (SUTURE) ×2 IMPLANT
SUT VICRYL+ 3-0 36IN CT-1 (SUTURE) ×6 IMPLANT
SYR 20ML LL LF (SYRINGE) ×2 IMPLANT
SYR 3ML LL SCALE MARK (SYRINGE) ×2 IMPLANT
TAG SUTURE CLAMP YLW 5PR (MISCELLANEOUS) ×4
TAPE UMBILICAL 1/8 X36 TWILL (MISCELLANEOUS) ×2 IMPLANT
TOWEL OR 17X26 4PK STRL BLUE (TOWEL DISPOSABLE) ×2 IMPLANT
TRAP FLUID SMOKE EVACUATOR (MISCELLANEOUS) ×2 IMPLANT
TRAY FOLEY MTR SLVR 16FR STAT (SET/KITS/TRAYS/PACK) ×2 IMPLANT
VALVULOTOME HEAD CUTTING URSL (MISCELLANEOUS) IMPLANT
VALVULOTOME URESIL (MISCELLANEOUS) ×2
WATER STERILE IRR 500ML POUR (IV SOLUTION) ×2 IMPLANT
WND VAC CAP TUBING TRAC (MISCELLANEOUS)

## 2023-07-16 NOTE — Anesthesia Procedure Notes (Signed)
Arterial Line Insertion Start/End10/16/2024 7:53 AM, 07/16/2023 7:57 AM Performed by: Reed Breech, MD, anesthesiologist  Patient location: OR. Preanesthetic checklist: patient identified, IV checked, site marked, risks and benefits discussed, surgical consent, monitors and equipment checked, pre-op evaluation, timeout performed and anesthesia consent Patient sedated Right, radial was placed Catheter size: 20 G Hand hygiene performed  and maximum sterile barriers used   Attempts: 1 Procedure performed using ultrasound guided technique. Ultrasound Notes:anatomy identified, needle tip was noted to be adjacent to the nerve/plexus identified and no ultrasound evidence of intravascular and/or intraneural injection Following insertion, dressing applied and Biopatch. Post procedure assessment: normal and unchanged  Patient tolerated the procedure well with no immediate complications.

## 2023-07-16 NOTE — Op Note (Addendum)
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  SURGEON: Festus Barren, MD and Levora Dredge, MD  ASSISTANT(S): None  ANESTHESIA: general  ESTIMATED BLOOD LOSS: 250 cc  FINDING(S): The saphenous vein was adequate to the proximal to mid calf but could not reach the distal target in the mid to distal calf.  A 8 to 10 cm piece of a large saphenous vein branch in the proximal thigh was used and was spliced to the saphenous vein to give adequate length for bypass.  SPECIMEN(S): None  INDICATIONS:   Melanie Browning is a 82 y.o. female who presents with disabling claudication symptoms of the left lower extremity after previous popliteal artery repair for a traumatic injury in years past with multiple interventions subsequent now with recurrent ischemic symptoms.  The patient required surgical bypass for revascularization.  Risks and benefits were discussed including but not limited to bleeding, infection, thrombosis, limb loss, injury to nearby structures, cardiopulmonary complications, and death.  DESCRIPTION: After full informed written consent was obtained, the patient was brought back to the operating room and placed supine upon the operating table.  Prior to induction, the patient was given intravenous antibiotics.  After obtaining adequate anesthesia, the patient was prepped and draped in the standard fashion for a femoral to popliteal bypass operation.  Attention was turned to the left groin.  A longitudinal incision was made over the left common femoral artery.  Using blunt dissection and electrocautery, the artery was dissected out from the inguinal ligament down to the femoral bifurcation.   The superficial femoral artery, profunda femoral artery, and external iliac artery were dissected out and vessel loops applied.  Circumflex branches were also dissected and controlled with vessel loops.  This common femoral artery was patent and found on exam to be somewhat calcified but adequate for inflow..      At this point, attention was turned to the calf.  An longitudinal incision was made one finger-width posterior to the tibia.  Using blunt dissection and electrocautery, a plane was developed through the subcutaneous tissue and fascia down to the posterior tibial.  The vein was dissected out and retracted medially and posteriorly.  The posterior tibial artery was dissected away from the tibial vein.  This posterior tibial artery was found to be a suitable target for the distal bypass anastomosis.       At this point, the patient's left greater saphenous vein was dissected out for harvest for conduit.  Skip incisions was made over the greater saphenous vein from the saphenofemoral junction down to mid to distal calf.  The vein conduit was found to be adequate for bypass conduit down to the proximal to mid calf, but there was about 7 or 8 cm of the distal vein that was quite small.  Side branches of greater saphenous vein were tied off with 3-0 silk ties.   We then passed the valvulotome to remove all valves and allow for flow of blood through the vein graft.  The small and large valvulotome were used but the distal vein for about 7 to 8 cm was clearly not adequate for bypass and would not allow good passage of the valvulotome.  At this point, it was clear we were going to have  to either use a prosthetic bypass or a spliced vein.  There was a large saphenous vein branch in the proximal thigh but we had dissected out near the saphenofemoral junction for vein harvest that would be adequate to be used for a spliced vein bypass.  This was dissected out over about an 8 to 10 cm span and prepared for use  as a spliced vein conduit.   At this point, the patient was given thousand units of Heparin intravenously.  After waiting three minutes, the external iliac artery, superficial femoral artery and profunda femoral artery were clamped.  An incision was made in the common femoral artery and extended proximally and distally with a Potts scissor.    The vein was taken off of the saphenofemoral junction after clamping the junction.  The junction was closed with two layers of 5-0 Prolene suture.  The proximal conduit was cut and bevelled to match the arteriotomy.  The conduit was sewn to the common femoral artery with a running stitch of 5-0 Prolene.  Prior to completing this anastomosis, all vessels were backbled.  No thrombus was noted from any vessels and backbleeding was good.  The anastomosis was completed in the usual fashion.  Attention was then turned to the tibial exposure.   We reset the exposure of the tibial space.  We verified the posterior tibial artery was appropriately marked.  I determine the target segment for the anastomosis.  We applied tension to control the artery proximally and distally with vessel loops.  We made an incision with a 11-blade in the artery and extended it proximally and distally with a Potts scissor. We ligated and divided the vein distally in the calf.  The distal vein was ligated with a 2-0 silk tie.  The inadequate portion of the vein was removed and this left about a 7 to 8 cm gap between the length of the saphenous vein down to the distal bypass anastomosis.  We then ligated the large proximal saphenous vein branch distally in the thigh and marked for orientation.  A spliced vein bypass was then created by spatulating both the distal saphenous vein to the saphenous vein branch that we are splicing for additional length.  An end-to-end bypass was performed to the saphenous vein and the vein branch with a total of four 6-0 Prolene sutures.  There was then excellent flow  through the spliced vein conduit.  We pulled the conduit to appropriate tension and length, taking into account straightening out the leg.  We adjusted the length of the conduit sharply.  We spatulated this conduit to meet the dimensions of the arteriotomy to the posterior tibial artery  The conduit was sewn to the posterior tibial artery with a running stitch of 6-0 Prolene.  Prior to completing this anastomosis, all vessels were backbled. No thrombus was noted from any vessels and backbleeding was good.  The bypass conduit was allowed to bleed in an antegrade fashion with excellent pulsatile flow.  The anastomosis was completed in the usual fashion.  We then used the ultrasound to mark out large sidebranches along the course of the saphenous vein.  3 incisions were made, 1 just distal to the knee and 2 in the proximal to mid thigh.  At these locations, the vein was dissected out and the side branches were identified and ligated with 2-0 silk ties.  At this point, all incisions were washed out and Fibrillar and Vistacel were placed into both incisions.    At this  point, bleeding in both incisions were controlled with electrocautery and suture ligature.  The calf incision was closed with interrupted deep sutures to reapproximate the deep muscles and a running stitch of 3-0 Vicryl in the subcutaneous tissue.  The skin was reapproximated with staples. Cellerate was used to help augment wound healing.  Attention was turned to the groin.  The groin was repaired with a double layer of 2-0 Vicryl immediately superficial to the bypass conduit.  The superficial subcutaneous tissue was reapproximated with two layers of 3-0 Vicryl.  The skin was reapproximated with staples.  Both the femoral incision and the calf incision had Cellerate placed to augment wound healing  The vein harvest and side branch ligation incisions were closed with a layer of 3-0 Vicryl in the subcutaneous tissue.  The skin was then  reapproximated with staples.  The skin was cleaned, dried, and sterile bandages applied.   The patient was then awakened from anesthesia and taken to the recovery room in stable condition having tolerated the procedure well.     COMPLICATIONS: none  CONDITION: stable   Festus Barren 07/16/2023 12:29 PM   This note was created with Dragon Medical transcription system. Any errors in dictation are purely unintentional.

## 2023-07-16 NOTE — Anesthesia Preprocedure Evaluation (Addendum)
Anesthesia Evaluation  Patient identified by MRN, date of birth, ID band Patient awake    Reviewed: Allergy & Precautions, NPO status , Patient's Chart, lab work & pertinent test results  History of Anesthesia Complications Negative for: history of anesthetic complications  Airway Mallampati: I   Neck ROM: Full    Dental no notable dental hx.    Pulmonary neg pulmonary ROS   Pulmonary exam normal breath sounds clear to auscultation       Cardiovascular + CAD (s/p stents on Brilinta), + Peripheral Vascular Disease and +CHF (diastolic dysfunction)  + Valvular Problems/Murmurs (mild-moderate AS)  Rhythm:Regular Rate:Normal + Systolic murmurs ECG 06/17/23: normal  Myocardial perfusion 06/24/23:    Normal pharmacologic myocardial perfusion stress test without evidence of significant ischemia or scar.   Left ventricular systolic function is normal (LVEF 60-65%).   Coronary artery calcification/stent, aortic atherosclerosis, and aortic valve calcifications noted on the attenuation correction CT.   This is a low risk study.  Echo 06/26/23:  1. Left ventricular ejection fraction, by estimation, is 60 to 65%. The left ventricle has normal function. The left ventricle has no regional wall motion abnormalities. Left ventricular diastolic parameters are consistent with Grade II diastolic dysfunction (pseudonormalization).  2. Right ventricular systolic function is normal. The right ventricular size is normal.  3. The mitral valve is normal in structure. No evidence of mitral valve regurgitation. No evidence of mitral stenosis.  4. The aortic valve is normal in structure. There is severe calcification of the aortic valve. There is severe thickening of the aortic valve. Aortic valve regurgitation is mild. Mild to moderate aortic valve stenosis. Aortic regurgitation PHT measures 337 msec. Aortic valve mean gradient measures 18.2 mmHg. Aortic valve  Vmax measures 2.76 m/s.  5. The inferior vena cava is normal in size with greater than 50% respiratory variability, suggesting right atrial pressure of 3 mmHg.       Neuro/Psych negative neurological ROS     GI/Hepatic negative GI ROS,,,  Endo/Other  Hypothyroidism    Renal/GU negative Renal ROS     Musculoskeletal  (+) Arthritis ,  Polymyalgia rheumatica   Abdominal   Peds  Hematology  (+) Blood dyscrasia, anemia   Anesthesia Other Findings Reviewed and agree with Edd Fabian pre-anesthesia clinical review note.    Cardiology note 06/17/23:  Coronary artery disease with stable angina Prior stent to proximal LAD, Reports having some shortness of breath and chest discomfort though does exercise daily Off Lipitor and Zetia secondary to elevated LFTs, we were unaware No recent lipid panel available On Brilinta for PAD Scheduled for angioplasty today, acceptable risk, no further testing needed prior to procedure later this morning   PAD Lower extremity claudication Prior stenting, surgery Followed by vascular surgery Has angioplasty later this morning She has indicated plan for bypass surgery on left leg late October 2024 On aspirin, Brilinta,  Not taking Zetia Lipitor We did discuss PCSK9 inhibitor if unable to achieve goal LDL less than 70 She prefers to check lipid panel first   Hyperlipidemia Off statins and Zetia last year Suggested she would likely need PCSK9 inhibitor Lipids pending today   Reproductive/Obstetrics                             Anesthesia Physical Anesthesia Plan  ASA: 4  Anesthesia Plan: General   Post-op Pain Management:    Induction: Intravenous  PONV Risk Score and Plan: 3 and Ondansetron, Dexamethasone  and Treatment may vary due to age or medical condition  Airway Management Planned: Oral ETT  Additional Equipment: Arterial line  Intra-op Plan:   Post-operative Plan: Extubation in OR  Informed  Consent: I have reviewed the patients History and Physical, chart, labs and discussed the procedure including the risks, benefits and alternatives for the proposed anesthesia with the patient or authorized representative who has indicated his/her understanding and acceptance.     Dental advisory given  Plan Discussed with: CRNA  Anesthesia Plan Comments: (Patient consented for risks of anesthesia including but not limited to:  - adverse reactions to medications - damage to eyes, teeth, lips or other oral mucosa - nerve damage due to positioning  - sore throat or hoarseness - damage to heart, brain, nerves, lungs, other parts of body or loss of life  Informed patient about role of CRNA in peri- and intra-operative care.  Patient voiced understanding.)        Anesthesia Quick Evaluation

## 2023-07-16 NOTE — Plan of Care (Signed)
  Problem: Education: Goal: Understanding of CV disease, CV risk reduction, and recovery process will improve Outcome: Progressing   Problem: Activity: Goal: Ability to return to baseline activity level will improve Outcome: Progressing   Problem: Cardiovascular: Goal: Ability to achieve and maintain adequate cardiovascular perfusion will improve Outcome: Progressing Goal: Vascular access site(s) Level 0-1 will be maintained Outcome: Progressing   Problem: Education: Goal: Knowledge of General Education information will improve Description: Including pain rating scale, medication(s)/side effects and non-pharmacologic comfort measures Outcome: Progressing   Problem: Clinical Measurements: Goal: Respiratory complications will improve Outcome: Progressing

## 2023-07-16 NOTE — Anesthesia Procedure Notes (Signed)
Procedure Name: Intubation Date/Time: 07/16/2023 7:52 AM  Performed by: Emeterio Reeve, CRNAPre-anesthesia Checklist: Patient identified, Emergency Drugs available, Suction available and Patient being monitored Patient Re-evaluated:Patient Re-evaluated prior to induction Oxygen Delivery Method: Circle system utilized Preoxygenation: Pre-oxygenation with 100% oxygen Induction Type: IV induction Ventilation: Mask ventilation without difficulty Laryngoscope Size: McGraph and 4 Grade View: Grade I Tube type: Oral Tube size: 7.5 mm Number of attempts: 1 Airway Equipment and Method: Stylet and Oral airway Placement Confirmation: ETT inserted through vocal cords under direct vision, positive ETCO2 and breath sounds checked- equal and bilateral Secured at: 23 cm Tube secured with: Tape Dental Injury: Teeth and Oropharynx as per pre-operative assessment  Comments: Cords clear, no trauma. CA

## 2023-07-16 NOTE — Interval H&P Note (Signed)
History and Physical Interval Note:  07/16/2023 7:19 AM  Melanie Browning  has presented today for surgery, with the diagnosis of ASO WITH REST PAIN.  The various methods of treatment have been discussed with the patient and family. After consideration of risks, benefits and other options for treatment, the patient has consented to  Procedure(s): BYPASS GRAFT FEMORAL-POPLITEAL ARTERY (FEMORAL-POSTERIOR TIBIAL BYPASS W/ SAPHENOUS VEIN- 82956) (Left) APPLICATION OF CELL SAVER (N/A) as a surgical intervention.  The patient's history has been reviewed, patient examined, no change in status, stable for surgery.  I have reviewed the patient's chart and labs.  Questions were answered to the patient's satisfaction.     Levora Dredge

## 2023-07-16 NOTE — Anesthesia Postprocedure Evaluation (Signed)
Anesthesia Post Note  Patient: Melanie Browning  Procedure(s) Performed: BYPASS GRAFT FEMORAL-POPLITEAL ARTERY (FEMORAL-POSTERIOR TIBIAL BYPASS W/ SAPHENOUS VEIN- 16109) (Left) APPLICATION OF CELL SAVER  Patient location during evaluation: PACU Anesthesia Type: General Level of consciousness: awake and alert, oriented and patient cooperative Pain management: pain level controlled Vital Signs Assessment: post-procedure vital signs reviewed and stable Respiratory status: spontaneous breathing, nonlabored ventilation and respiratory function stable Cardiovascular status: blood pressure returned to baseline and stable Postop Assessment: adequate PO intake Anesthetic complications: no   No notable events documented.   Last Vitals:  Vitals:   07/16/23 1300 07/16/23 1315  BP: (!) 100/59   Pulse: 62 67  Resp: 12 14  Temp:    SpO2: 100% 100%    Last Pain:  Vitals:   07/16/23 1245  TempSrc:   PainSc: 7                  Reed Breech

## 2023-07-16 NOTE — Transfer of Care (Signed)
Immediate Anesthesia Transfer of Care Note  Patient: Melanie Browning  Procedure(s) Performed: BYPASS GRAFT FEMORAL-POPLITEAL ARTERY (FEMORAL-POSTERIOR TIBIAL BYPASS W/ SAPHENOUS VEIN- 16109) (Left) APPLICATION OF CELL SAVER  Patient Location: PACU  Anesthesia Type:General  Level of Consciousness: awake, alert , and oriented  Airway & Oxygen Therapy: Patient Spontanous Breathing and Patient connected to face mask oxygen  Post-op Assessment: Report given to RN and Post -op Vital signs reviewed and stable  Post vital signs: stable  Last Vitals:  Vitals Value Taken Time  BP 115/91 07/16/23 1245  Temp    Pulse 72 07/16/23 1246  Resp 16 07/16/23 1246  SpO2 100 % 07/16/23 1246  Vitals shown include unfiled device data.  Last Pain:  Vitals:   07/16/23 0641  TempSrc: Temporal  PainSc: 0-No pain         Complications: No notable events documented.

## 2023-07-16 NOTE — Op Note (Signed)
[facility]    OPERATIVE NOTE   PROCEDURE: left common femoral artery to posterior tibial artery bypass with composite in situ saphenous vein with a spliced segment of reversed saphenous vein.  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 and Annice Needy, MD  ASSISTANT(S): None  ANESTHESIA: general  ESTIMATED BLOOD LOSS: 250 cc  FINDING(S): None   SPECIMEN(S): None  INDICATIONS:   Melanie Browning is a 82 y.o. female who presents with rest pain of the left lower extremity.  At this point the patient has undergone numerous interventions which although successful have never had significant durability.  At her most recent intervention the posterior tibial was identified as a good target vessel.  The risk, benefits, and alternative for bypass operations were discussed with the patient.  The patient is aware the risks include but are not limited to: bleeding, infection, myocardial infarction, stroke, limb loss, nerve damage, need for additional procedures in the future, wound complications, and inability to complete the bypass.  The patient is voices understanding of these risks and agreed to proceed.  DESCRIPTION: After informed consent was obtained, the patient was brought back to the operating room and placed in the supine position.  Prior to induction, the patient was given intravenous antibiotics.  After general anesthesia is induced, the patient was prepped and draped in the standard fashion for a femoral to popliteal bypass operation.  Appropriate timeout is called.  Co-surgeons are required because this is a complex multilevel procedure with work being performed simultaneously from both the patient's right and left sides.  This also expedites the procedure making a shorter operative time  reducing complications and improving patient safety.   Attention was turned to the left groin.  A longitudinal incision was made over the left common femoral artery.  Using blunt dissection and electrocautery, the artery was dissected circumferentially from the inguinal ligament down to the femoral bifurcation.  The superficial femoral artery, profunda femoral artery, and distal external iliac artery are looped with Silastic vessel loops.  Circumflex branches were also dissected and controlled with vessel loops as needed.     The posterior tibial artery was then approached through a medial calf incision.  This was planned to center on the distal aspect of the gastroc muscle.  Initially, the great saphenous vein was identified.  The first vein to be identified was quite small and would not be adequate for bypass and therefore using ultrasound the true saphenous vein was identified at the level of the knee.  It was then followed distally and uncovered through the medial incision.  At this level the distal 10 cm or so of the vein was still rather small and marginal for adequate bypass.  Nevertheless, the vein was dissected circumferentially throughout the medial incision a third small incision was also created at the level of a large branch which was then ligated with 2-0 silk.  Having isolated the vein the fascia was then incised and the dissection carried down to expose the posterior tibial vascular bundle.  The posterior tibial artery was then readily identified and dissected circumferentially looped proximally and distally with Silastic Vesseloops.  The patient was given 6000 units of Heparin intravenously, and this is allowed to circulate for proximally 3 minutes.  The common femoral artery, superficial femoral artery, and profunda femoral artery were then clamped along with any  circumflex branches.  Arteriotomy is then made in the common femoral artery with an 11 blade and extended with Potts scissors  down to the origin of the SFA.   Satinsky clamp was then placed at the level of the saphenofemoral junction and the saphenous vein which had been skeletonized was divided from the common femoral vein.  The common femoral venotomy was then closed with a 5-0 Prolene in a horizontal mattress followed by over and under stitch.  Hemostasis was achieved and attention was turned to the saphenous vein proper which was then inverted and the proximalmost valve excised under direct visualization.  The saphenous vein was then approximated to the common femoral arteriotomy and an end saphenous vein to side common femoral artery anastomosis was fashioned with running 5-0 Prolene.  Flushing maneuvers were performed and flow was reestablished first of the profunda femoris then the SFA and then the vein graft.  The saphenous vein was then ligated and divided in the distal calf with a 2-0 silk.  The Lamay valvulotome was then advanced through the vein and using forward pressure from the arterial perfusion in association with constant perfusion with heparinized saline a total of 2 passes were made using the valvulotome first with the smallest cutter and then the medium size cutter.  In the distal saphenous vein down by the level of the calf it became quite clear that the quality of the vein was not going to be adequate for bypass.  Therefore attention was returned to the groin where the anterior lateral saphenous vein had already been identified it had been ligated and divided from the saphenofemoral junction.  It was of good caliber and therefore it was dissected distally for a distance of approximately 10 to 15 cm.  It was ligated and divided with a 2-0 silk.  It was then reversed the an adequate segment of saphenous vein distally was then transected and the cut end of the true saphenous vein spatulated.  The anterior lateral piece was then reversed as noted above and spatulated.  An end-to-end anastomosis was then fashioned  between the 2 pieces of vein using two 6-0 Prolene sutures beginning at the heel and the toe and running the suture lines to the midportion of each side.  Flushing maneuvers were performed the vein graft was pressurized and noted to be excellent with good pulsatility.  This created a composite graft using a segment of reversed non-great saphenous vein distally in association with in situ great saphenous vein proximally.  It was then approximated to the posterior tibial artery to select out arteriotomy site.  The posterior tibial artery was then controlled proximally and distally with a Silastic Vesseloops.  Arteriotomy was made with 11 blade scalpel extended with Potts scissors.  The vein graft was now spatulated to match the arteriotomy and an end vein to side posterior tibial artery anastomosis was fashioned with running 6-0 Prolene.  Flushing maneuvers were performed and then flow was initiated first in a retrograde fashion through the posterior tibial in the antegrade fashion.  The ultrasound was then delivered onto the field and the saphenous vein mapped from the groin incision down to the calf incision.  3 large branches were identified and incisions corresponding to these locations were then created and the dissection carried down through the soft tissue to expose the saphenous vein.  Once the branches have been localized they were ligated with 2-0 silk ties.  After all 3 sites had been treated there was a marked improvement in  the pulsatility of the graft with excellent pulse in the posterior tibial artery distally.  The wounds were then irrigated with sterile saline.  The wounds were then inspected for hemostasis. Bleeding points were controlled with electrocautery, and suture repair of active bleeding points.  Fibrillar and Vistaseal were then placed in the bed of the wounds. Once hemostasis was achieved.  The groin incision was then closed in multiple layers using both 2-0 and 3-0 Vicryl in  interrupted and running fashion. Skin was closed with a staples. The medial calf and individual thigh wounds were then closed layers using  3-0 Vicryl, and staples.   A Prevena disposable VAC dressing was applied to the groin incision and honeycomb dressings were applied to each of the distal incisions.   COMPLICATIONS: None  CONDITION: Good  Renford Dills, M.D. [facility] Office: [phone]  07/16/2023, 12:27 PM

## 2023-07-17 ENCOUNTER — Encounter: Payer: Self-pay | Admitting: Vascular Surgery

## 2023-07-17 LAB — CBC
HCT: 20.9 % — ABNORMAL LOW (ref 36.0–46.0)
HCT: 21.9 % — ABNORMAL LOW (ref 36.0–46.0)
Hemoglobin: 6.9 g/dL — ABNORMAL LOW (ref 12.0–15.0)
Hemoglobin: 7.2 g/dL — ABNORMAL LOW (ref 12.0–15.0)
MCH: 32.7 pg (ref 26.0–34.0)
MCH: 33.5 pg (ref 26.0–34.0)
MCHC: 32.9 g/dL (ref 30.0–36.0)
MCHC: 33 g/dL (ref 30.0–36.0)
MCV: 101.5 fL — ABNORMAL HIGH (ref 80.0–100.0)
MCV: 99.5 fL (ref 80.0–100.0)
Platelets: 122 10*3/uL — ABNORMAL LOW (ref 150–400)
Platelets: 126 10*3/uL — ABNORMAL LOW (ref 150–400)
RBC: 2.06 MIL/uL — ABNORMAL LOW (ref 3.87–5.11)
RBC: 2.2 MIL/uL — ABNORMAL LOW (ref 3.87–5.11)
RDW: 13.6 % (ref 11.5–15.5)
RDW: 13.7 % (ref 11.5–15.5)
WBC: 6.8 10*3/uL (ref 4.0–10.5)
WBC: 9.2 10*3/uL (ref 4.0–10.5)
nRBC: 0 % (ref 0.0–0.2)
nRBC: 0 % (ref 0.0–0.2)

## 2023-07-17 LAB — PREPARE RBC (CROSSMATCH)

## 2023-07-17 LAB — BASIC METABOLIC PANEL WITH GFR
Anion gap: 6 (ref 5–15)
BUN: 18 mg/dL (ref 8–23)
CO2: 21 mmol/L — ABNORMAL LOW (ref 22–32)
Calcium: 7.6 mg/dL — ABNORMAL LOW (ref 8.9–10.3)
Chloride: 106 mmol/L (ref 98–111)
Creatinine, Ser: 0.67 mg/dL (ref 0.44–1.00)
GFR, Estimated: >60 mL/min
Glucose, Bld: 123 mg/dL — ABNORMAL HIGH (ref 70–99)
Potassium: 3.9 mmol/L (ref 3.5–5.1)
Sodium: 133 mmol/L — ABNORMAL LOW (ref 135–145)

## 2023-07-17 LAB — HEMOGLOBIN AND HEMATOCRIT, BLOOD
HCT: 23.4 % — ABNORMAL LOW (ref 36.0–46.0)
Hemoglobin: 7.8 g/dL — ABNORMAL LOW (ref 12.0–15.0)

## 2023-07-17 MED ORDER — FAMOTIDINE 20 MG PO TABS
20.0000 mg | ORAL_TABLET | Freq: Two times a day (BID) | ORAL | Status: DC
  Administered 2023-07-17 – 2023-07-25 (×16): 20 mg via ORAL
  Filled 2023-07-17 (×16): qty 1

## 2023-07-17 MED ORDER — SODIUM CHLORIDE 0.9% IV SOLUTION: Freq: Once | INTRAVENOUS | Status: DC

## 2023-07-17 NOTE — Progress Notes (Signed)
[facility]  Daily Progress Note   Subjective  - 1 Day Post-Op  Patient did well last night.  She notes significant incisional pain.  She denies any foot pain or rest pain like symptoms.  Objective Vitals:   07/17/23 0605 07/17/23 0747 07/17/23 0800 07/17/23 0900  BP: 131/63  (!) 113/90 (!) 121/58  Pulse: 67 77 78 70  Resp: 13 15 15 16   Temp:  98.1 F (36.7 C)    TempSrc:  Oral    SpO2: 99% 97% 99% 100%  Weight:      Height:        Intake/Output Summary (Last 24 hours) at 07/17/2023 0936 Last data filed at 07/17/2023 0800 Gross per 24 hour  Intake 3408.92 ml  Output 2055 ml  Net 1353.92 ml    PULM  Normal effort , no use of accessory muscles CV  No JVD, RRR Abd      No distended, nontender VASC  incisions are clean dry and intact there is 1+ edema.  The left foot is hyperemic and warm to the touch  Laboratory CBC    Component Value Date/Time   WBC 9.2 07/17/2023 0008   HGB 7.2 (L) 07/17/2023 0008   HGB 13.2 06/17/2023 0931   HCT 21.9 (L) 07/17/2023 0008   HCT 39.8 06/17/2023 0931   PLT 126 (L) 07/17/2023 0008   PLT 181 06/17/2023 0931    BMET    Component Value Date/Time   NA 133 (L) 07/17/2023 0008   K 3.9 07/17/2023 0008   CL 106 07/17/2023 0008   CO2 21 (L) 07/17/2023 0008   GLUCOSE 123 (H) 07/17/2023 0008   BUN 18 07/17/2023 0008   CREATININE 0.67 07/17/2023 0008   CALCIUM 7.6 (L) 07/17/2023 0008   GFRNONAA >60 07/17/2023 0008    Assessment/Planning: POD # 1 s/p left femoral to posterior tibial bypass grafting with composite in situ and reversed saphenous vein  Patient is having a typical postoperative course.  I will recheck her hemoglobin this afternoon at approximately 3.  When her IV saline infusion is completed she is tolerating liquids and therefore does not need further parenteral IV fluids.  Physical therapy is to work with her today.  Dressings appear intact and appropriate.  Advance diet and advance activity  control pain continue to monitor her CBC and BMP   Levora Dredge  07/17/2023, 9:36 AM

## 2023-07-17 NOTE — Progress Notes (Signed)
PHARMACIST - PHYSICIAN COMMUNICATION  CONCERNING: IV to Oral Route Change Policy  RECOMMENDATION: This patient is receiving Pepcid by the intravenous route.  Based on criteria approved by the Pharmacy and Therapeutics Committee, the intravenous medication(s) is/are being converted to the equivalent oral dose form(s).  DESCRIPTION: These criteria include: The patient is eating (either orally or via tube) and/or has been taking other orally administered medications for a least 24 hours The patient has no evidence of active gastrointestinal bleeding or impaired GI absorption (gastrectomy, short bowel, patient on TNA or NPO).  If you have questions about this conversion, please contact the Pharmacy Department   Tressie Ellis, [facility] 07/17/2023 8:25 AM

## 2023-07-17 NOTE — Evaluation (Signed)
Occupational Therapy Evaluation Patient Details Name: Melanie Browning MRN: [id number] DOB: 20-May-1941 Today's Date: 07/17/2023   History of Present Illness Patient is an 82 year old female s/p left femoral artery to below-the-knee popliteal artery bypass with in-situ saphenous vein graft spliced with a large proximal leg saphenous vein branch. WBAT.   Clinical Impression   Ms Thore was seen for OT/PT evaluation this date. Prior to hospital admission, pt was IND. Pt lives at [facility] with spouse who is unable to provide physical assist. Pt currently requires MIN A + RW for simulated BSC t/f. MOD A don B socks at bed level. SETUP seated grooming tasks. Pt would benefit from skilled OT to address noted impairments and functional limitations (see below for any additional details). Upon hospital discharge, recommend OT follow up.    If plan is discharge home, recommend the following: A little help with walking and/or transfers;A little help with bathing/dressing/bathroom;Help with stairs or ramp for entrance    Functional Status Assessment  Patient has had a recent decline in their functional status and demonstrates the ability to make significant improvements in function in a reasonable and predictable amount of time.  Equipment Recommendations  BSC/3in1    Recommendations for Other Services       Precautions / Restrictions Precautions Precautions: Fall Restrictions Weight Bearing Restrictions: Yes LLE Weight Bearing: Weight bearing as tolerated      Mobility Bed Mobility Overal bed mobility: Needs Assistance Bed Mobility: Supine to Sit     Supine to sit: Min assist          Transfers Overall transfer level: Needs assistance Equipment used: Rolling walker (2 wheels) Transfers: Sit to/from Stand Sit to Stand: Min assist                  Balance Overall balance assessment: Needs assistance Sitting-balance support: Feet supported Sitting balance-Leahy Scale: Good      Standing balance support: Bilateral upper extremity supported, During functional activity, Reliant on assistive device for balance Standing balance-Leahy Scale: Fair                             ADL either performed or assessed with clinical judgement   ADL Overall ADL's : Needs assistance/impaired                                       General ADL Comments: MIN A + RW for simulated BSC t/f. MOD A don B socks at bed level. SETUP seated grooming tasks      Pertinent Vitals/Pain Pain Assessment Pain Assessment: 0-10 Pain Score: 4  Pain Location: L groin at wound vac site Pain Descriptors / Indicators: Discomfort, Sore, Tightness Pain Intervention(s): Limited activity within patient's tolerance, Repositioned     Extremity/Trunk Assessment Upper Extremity Assessment Upper Extremity Assessment: Generalized weakness   Lower Extremity Assessment Lower Extremity Assessment: Generalized weakness LLE: Unable to fully assess due to pain LLE Coordination: decreased gross motor   Cervical / Trunk Assessment Cervical / Trunk Assessment: Normal   Communication Communication Communication: No apparent difficulties Cueing Techniques: Verbal cues   Cognition Arousal: Alert Behavior During Therapy: WFL for tasks assessed/performed Overall Cognitive Status: Within Functional Limits for tasks assessed  Home Living Family/patient expects to be discharged to:: Private residence Living Arrangements: Spouse/significant other Available Help at Discharge: Family;Available 24 hours/day Type of Home: House Home Access: Level entry     Home Layout: One level     Bathroom Shower/Tub: Walk-in shower         Home Equipment: Agricultural consultant (2 wheels);Rollator (4 wheels)          Prior Functioning/Environment Prior Level of Function : Independent/Modified Independent;Driving              Mobility Comments: not using AD prior to admission, drives, exercises, walks dogs. ADLs Comments: independent        OT Problem List: Decreased strength;Decreased range of motion;Impaired balance (sitting and/or standing);Decreased activity tolerance;Decreased safety awareness      OT Treatment/Interventions: Self-care/ADL training;Therapeutic exercise;Energy conservation;DME and/or AE instruction;Therapeutic activities;Patient/family education;Balance training    OT Goals(Current goals can be found in the care plan section) Acute Rehab OT Goals Patient Stated Goal: to return to PLOF OT Goal Formulation: With patient Time For Goal Achievement: 07/31/23 Potential to Achieve Goals: Good ADL Goals Pt Will Perform Grooming: with modified independence;standing Pt Will Perform Lower Body Dressing: with modified independence;sit to/from stand Pt Will Transfer to Toilet: with modified independence;ambulating;regular height toilet  OT Frequency: Min 1X/week    Co-evaluation PT/OT/SLP Co-Evaluation/Treatment: Yes Reason for Co-Treatment: For patient/therapist safety PT goals addressed during session: Mobility/safety with mobility OT goals addressed during session: ADL's and self-care      AM-PAC OT "6 Clicks" Daily Activity     Outcome Measure Help from another person eating meals?: None Help from another person taking care of personal grooming?: A Little Help from another person toileting, which includes using toliet, bedpan, or urinal?: A Little Help from another person bathing (including washing, rinsing, drying)?: A Lot Help from another person to put on and taking off regular upper body clothing?: None Help from another person to put on and taking off regular lower body clothing?: A Lot 6 Click Score: 18   End of Session Nurse Communication: Mobility status  Activity Tolerance: Patient tolerated treatment well;Patient limited by pain Patient left: in chair;with call bell/phone  within reach  OT Visit Diagnosis: Other abnormalities of gait and mobility (R26.89);Muscle weakness (generalized) (M62.81)                Time: 4098-1191 OT Time Calculation (min): 20 min Charges:  OT General Charges $OT Visit: 1 Visit OT Evaluation $OT Eval Moderate Complexity: 1 Mod  Kathie Dike, M.S. OTR/L  07/17/23, 10:40 AM  ascom [phone]

## 2023-07-17 NOTE — Plan of Care (Signed)
  Problem: Activity: Goal: Ability to return to baseline activity level will improve Outcome: Progressing   Problem: Cardiovascular: Goal: Vascular access site(s) Level 0-1 will be maintained Outcome: Progressing   Problem: Health Behavior/Discharge Planning: Goal: Ability to safely manage health-related needs after discharge will improve Outcome: Progressing   Problem: Activity: Goal: Risk for activity intolerance will decrease Outcome: Progressing   Problem: Nutrition: Goal: Adequate nutrition will be maintained Outcome: Progressing   Problem: Elimination: Goal: Will not experience complications related to urinary retention Outcome: Progressing   Problem: Pain Managment: Goal: General experience of comfort will improve Outcome: Progressing   Problem: Activity: Goal: Ability to tolerate increased activity will improve Outcome: Progressing

## 2023-07-17 NOTE — Evaluation (Signed)
Physical Therapy Evaluation Patient Details Name: Melanie Browning MRN: [id number] DOB: 15-May-1941 Today's Date: 07/17/2023  History of Present Illness  Patient is an 82 year old female s/p left femoral artery to below-the-knee popliteal artery bypass with in-situ saphenous vein graft spliced with a large proximal leg saphenous vein branch. WBAT.  Clinical Impression  Patient received in bed, she is agreeable to PT assessment. Patient requires min A for bed mobility with increased time and effort to complete. Patient tends to keep L LE extended at knee and requires cues to flex. She is able to stand from bed with cues and min A. Patient is able to ambulate about 5 feet from bed to recliner with RW. Pain limiting weight shifting to her left. She will continue to benefit from skilled PT to improve functional independence, strength and rom.          If plan is discharge home, recommend the following: A little help with walking and/or transfers;A little help with bathing/dressing/bathroom   Can travel by private vehicle    no    Equipment Recommendations None recommended by PT  Recommendations for Other Services       Functional Status Assessment Patient has had a recent decline in their functional status and demonstrates the ability to make significant improvements in function in a reasonable and predictable amount of time.     Precautions / Restrictions Precautions Precautions: Fall Restrictions Weight Bearing Restrictions: Yes LLE Weight Bearing: Weight bearing as tolerated      Mobility  Bed Mobility Overal bed mobility: Needs Assistance Bed Mobility: Supine to Sit     Supine to sit: Min assist     General bed mobility comments: min A to raise trunk to seated position, increased time and effort due to pain    Transfers Overall transfer level: Needs assistance Equipment used: Rolling walker (2 wheels) Transfers: Sit to/from Stand Sit to Stand: Min assist            General transfer comment: cues for hand placement    Ambulation/Gait Ambulation/Gait assistance: Contact guard assist Gait Distance (Feet): 5 Feet Assistive device: Rolling walker (2 wheels) Gait Pattern/deviations: Step-to pattern, Decreased weight shift to left Gait velocity: decr     General Gait Details: patient ambulated to recliner from bed  Stairs            Wheelchair Mobility     Tilt Bed    Modified Rankin (Stroke Patients Only)       Balance Overall balance assessment: Needs assistance Sitting-balance support: Feet supported Sitting balance-Leahy Scale: Good     Standing balance support: Bilateral upper extremity supported, During functional activity, Reliant on assistive device for balance Standing balance-Leahy Scale: Fair                               Pertinent Vitals/Pain Pain Assessment Pain Assessment: 0-10 Pain Score: 4  Pain Location: LLE froin area Pain Descriptors / Indicators: Discomfort, Sore, Tightness Pain Intervention(s): Monitored during session, Repositioned    Home Living Family/patient expects to be discharged to:: Private residence Living Arrangements: Spouse/significant other Available Help at Discharge: Family;Available 24 hours/day Type of Home: House Home Access: Level entry       Home Layout: One level Home Equipment: Agricultural consultant (2 wheels);Rollator (4 wheels)      Prior Function Prior Level of Function : Independent/Modified Independent;Driving  Mobility Comments: not using AD prior to admission, drives, exercises, walks dogs. ADLs Comments: independent     Extremity/Trunk Assessment   Upper Extremity Assessment Upper Extremity Assessment: Defer to OT evaluation    Lower Extremity Assessment Lower Extremity Assessment: Generalized weakness;LLE deficits/detail LLE: Unable to fully assess due to pain LLE Coordination: decreased gross motor    Cervical / Trunk  Assessment Cervical / Trunk Assessment: Normal  Communication   Communication Communication: No apparent difficulties Cueing Techniques: Verbal cues  Cognition Arousal: Alert Behavior During Therapy: WFL for tasks assessed/performed Overall Cognitive Status: Within Functional Limits for tasks assessed                                          General Comments      Exercises     Assessment/Plan    PT Assessment Patient needs continued PT services  PT Problem List Decreased strength;Decreased range of motion;Decreased activity tolerance;Decreased balance;Decreased mobility;Pain;Decreased knowledge of use of DME;Decreased skin integrity       PT Treatment Interventions Gait training;DME instruction;Stair training;Functional mobility training;Therapeutic activities;Therapeutic exercise;Balance training;Neuromuscular re-education;Patient/family education    PT Goals (Current goals can be found in the Care Plan section)  Acute Rehab PT Goals Patient Stated Goal: to improve pain, return home with husband PT Goal Formulation: With patient Time For Goal Achievement: 07/24/23 Potential to Achieve Goals: Good    Frequency Min 1X/week     Co-evaluation               AM-PAC PT "6 Clicks" Mobility  Outcome Measure Help needed turning from your back to your side while in a flat bed without using bedrails?: A Little Help needed moving from lying on your back to sitting on the side of a flat bed without using bedrails?: A Little Help needed moving to and from a bed to a chair (including a wheelchair)?: A Little Help needed standing up from a chair using your arms (e.g., wheelchair or bedside chair)?: A Little Help needed to walk in hospital room?: A Little Help needed climbing 3-5 steps with a railing? : A Little 6 Click Score: 18    End of Session   Activity Tolerance: Patient limited by pain Patient left: in chair;with call bell/phone within reach Nurse  Communication: Mobility status PT Visit Diagnosis: Other abnormalities of gait and mobility (R26.89);Muscle weakness (generalized) (M62.81);Difficulty in walking, not elsewhere classified (R26.2);Pain Pain - Right/Left: Left Pain - part of body: Leg    Time: 0911-0939 PT Time Calculation (min) (ACUTE ONLY): 28 min   Charges:   PT Evaluation $PT Eval Moderate Complexity: 1 Mod   PT General Charges $$ ACUTE PT VISIT: 1 Visit         Amiri Riechers, PT, GCS 07/17/23,10:29 AM

## 2023-07-17 NOTE — Progress Notes (Addendum)
Dr. Gilda Crease was made aware current hemoglobin in 7.2, per md recheck CBC at 1500 today. Per Dr. Gilda Crease okay to still give scheduled lovenox

## 2023-07-17 NOTE — Progress Notes (Signed)
  Progress Note    07/17/2023 9:29 AM 1 Day Post-Op  Subjective:  Melanie Browning is an 82 yo female now POD #1 from Left femoral artery to below-the-knee popliteal artery bypass with in-situ saphenous vein graft spliced with a large proximal leg saphenous vein branch.   On exam this morning the patient was anxious and had multiple questions. I answered all her questions this morning. She endorses pain at the incision sites. Dressings are intact with serous drainage noted. Patient is recovering as expected. No complaints overnight and vitals all remain stable.     Vitals:   07/17/23 0800 07/17/23 0900  BP: (!) 113/90 (!) 121/58  Pulse: 78 70  Resp: 15 16  Temp:    SpO2: 99% 100%   Physical Exam: Cardiac:  RRR, Normal S1, S2. No murmurs appreciated.  Lungs:  Clear on auscultation throughout while being diminished in the bases.  Incisions:  Left Lower extremity. Dressings clean dry and intact.  Extremities:  Bilateral lower extremities warm to touch. Left lower extremity with surgical incisions and +1 edema. Palpable DP/PT pulses.  Abdomen:  Positive bowel sounds, soft, non tender and non distended.  Neurologic: AAOX4, Answers all questions and follows commands appropriately.   CBC    Component Value Date/Time   WBC 9.2 07/17/2023 0008   RBC 2.20 (L) 07/17/2023 0008   HGB 7.2 (L) 07/17/2023 0008   HGB 13.2 06/17/2023 0931   HCT 21.9 (L) 07/17/2023 0008   HCT 39.8 06/17/2023 0931   PLT 126 (L) 07/17/2023 0008   PLT 181 06/17/2023 0931   MCV 99.5 07/17/2023 0008   MCV 99 (H) 06/17/2023 0931   MCH 32.7 07/17/2023 0008   MCHC 32.9 07/17/2023 0008   RDW 13.6 07/17/2023 0008   RDW 13.1 06/17/2023 0931   LYMPHSABS 0.8 01/03/2023 1142   MONOABS 0.5 01/03/2023 1142   EOSABS 0.1 01/03/2023 1142   BASOSABS 0.0 01/03/2023 1142    BMET    Component Value Date/Time   NA 133 (L) 07/17/2023 0008   K 3.9 07/17/2023 0008   CL 106 07/17/2023 0008   CO2 21 (L) 07/17/2023 0008    GLUCOSE 123 (H) 07/17/2023 0008   BUN 18 07/17/2023 0008   CREATININE 0.67 07/17/2023 0008   CALCIUM 7.6 (L) 07/17/2023 0008   GFRNONAA >60 07/17/2023 0008    INR No results found for: "INR"   Intake/Output Summary (Last 24 hours) at 07/17/2023 0929 Last data filed at 07/17/2023 0800 Gross per 24 hour  Intake 3408.92 ml  Output 2055 ml  Net 1353.92 ml     Assessment/Plan:  82 y.o. female is s/p Left femoral artery to below-the-knee popliteal artery bypass with in-situ saphenous vein graft spliced with a large proximal leg saphenous vein branch.  1 Day Post-Op   PLAN: Transfuse PRBC's 1 unit today for a hemoglobin of 7.2 Follow up CBC later today Advance diet as tolerated. Pain medication PRN OOB to chair for 6 hours today Incentive spirometry q1 hr while awake.  Discontinue foley catheter today.   DVT prophylaxis:  Lovenox 40 mg Q 24hrs   Melanie Browning [facility] 07/17/2023 9:29 AM

## 2023-07-18 ENCOUNTER — Other Ambulatory Visit: Payer: Self-pay

## 2023-07-18 LAB — TYPE AND SCREEN
ABO/RH(D): A POS
Antibody Screen: NEGATIVE
Unit division: 0
Unit division: 0

## 2023-07-18 LAB — CBC
HCT: 26.4 % — ABNORMAL LOW (ref 36.0–46.0)
Hemoglobin: 8.8 g/dL — ABNORMAL LOW (ref 12.0–15.0)
MCH: 33.2 pg (ref 26.0–34.0)
MCHC: 33.3 g/dL (ref 30.0–36.0)
MCV: 99.6 fL (ref 80.0–100.0)
Platelets: 144 10*3/uL — ABNORMAL LOW (ref 150–400)
RBC: 2.65 MIL/uL — ABNORMAL LOW (ref 3.87–5.11)
RDW: 14.2 % (ref 11.5–15.5)
WBC: 8.9 10*3/uL (ref 4.0–10.5)
nRBC: 0 % (ref 0.0–0.2)

## 2023-07-18 LAB — BPAM RBC
Blood Product Expiration Date: 202411122359
Blood Product Expiration Date: 202411132359
ISSUE DATE / TIME: 202410171349
ISSUE DATE / TIME: 202410171642
Unit Type and Rh: 6200
Unit Type and Rh: 6200

## 2023-07-18 NOTE — Progress Notes (Signed)
Occupational Therapy Treatment Patient Details Name: Melanie Browning MRN: [id number] DOB: June 14, 1941 Today's Date: 07/18/2023   History of present illness Patient is an 82 year old female s/p left femoral artery to below-the-knee popliteal artery bypass with in-situ saphenous vein graft spliced with a large proximal leg saphenous vein branch. WBAT.   OT comments  Melanie Browning was seen for OT treatment on this date inhand off from PT session, pt agreeable. Pt requires SBA + RW for standing grooming tasks, tolerates ~10 min with intermittent single UE support. CGA + RW for ADL t/f ~30 ft. Pt making good progress toward goals, will continue to follow POC. Discharge recommendation remains appropriate.       If plan is discharge home, recommend the following:  A little help with walking and/or transfers;A little help with bathing/dressing/bathroom;Help with stairs or ramp for entrance   Equipment Recommendations  BSC/3in1    Recommendations for Other Services      Precautions / Restrictions Precautions Precautions: Fall Restrictions Weight Bearing Restrictions: Yes LLE Weight Bearing: Weight bearing as tolerated       Mobility Bed Mobility Overal bed mobility: Needs Assistance Bed Mobility: Sit to Supine       Sit to supine: Contact guard assist        Transfers Overall transfer level: Needs assistance Equipment used: Rolling walker (2 wheels) Transfers: Sit to/from Stand Sit to Stand: Contact guard assist                 Balance Overall balance assessment: Needs assistance Sitting-balance support: Feet supported Sitting balance-Leahy Scale: Good     Standing balance support: No upper extremity supported, During functional activity Standing balance-Leahy Scale: Fair                             ADL either performed or assessed with clinical judgement   ADL Overall ADL's : Needs assistance/impaired                                        General ADL Comments: SBA + RW for standing grooming tasks. CGA + RW for ADL t/f ~25 ft.      Cognition Arousal: Alert Behavior During Therapy: WFL for tasks assessed/performed Overall Cognitive Status: Within Functional Limits for tasks assessed                                                     Pertinent Vitals/ Pain       Pain Assessment Pain Assessment: Faces Faces Pain Scale: Hurts little more Pain Location: L groin at wound vac site Pain Descriptors / Indicators: Discomfort, Sore, Tightness Pain Intervention(s): Limited activity within patient's tolerance, Repositioned   Frequency  Min 1X/week        Progress Toward Goals  OT Goals(current goals can now be found in the care plan section)  Progress towards OT goals: Progressing toward goals  Acute Rehab OT Goals Patient Stated Goal: to go home OT Goal Formulation: With patient Time For Goal Achievement: 07/31/23 Potential to Achieve Goals: Good ADL Goals Pt Will Perform Grooming: with modified independence;standing Pt Will Perform Lower Body Dressing: with modified independence;sit to/from stand Pt Will Transfer to Toilet: with modified  independence;ambulating;regular height toilet   AM-PAC OT "6 Clicks" Daily Activity     Outcome Measure   Help from another person eating meals?: None Help from another person taking care of personal grooming?: A Little Help from another person toileting, which includes using toliet, bedpan, or urinal?: A Little Help from another person bathing (including washing, rinsing, drying)?: A Lot Help from another person to put on and taking off regular upper body clothing?: None Help from another person to put on and taking off regular lower body clothing?: A Lot 6 Click Score: 18    End of Session Equipment Utilized During Treatment: Rolling walker (2 wheels)  OT Visit Diagnosis: Other abnormalities of gait and mobility (R26.89);Muscle weakness  (generalized) (M62.81)   Activity Tolerance Patient tolerated treatment well   Patient Left in bed;with call bell/phone within reach   Nurse Communication          Time: 9528-4132 OT Time Calculation (min): 25 min  Charges: OT General Charges $OT Visit: 1 Visit OT Treatments $Self Care/Home Management : 8-22 mins  Kathie Dike, M.S. OTR/L  07/18/23, 11:45 AM  ascom [phone]

## 2023-07-18 NOTE — TOC Initial Note (Signed)
Transition of Care [facility]) - Initial/Assessment Note    Patient Details  Name: Melanie Browning MRN: [id number] Date of Birth: 11-29-1940  [facility]) CM/SW Contact:    Truddie Hidden, RN Phone Number: 07/18/2023, 3:39 PM  Clinical Narrative:                 Spoke with patient at her bedside regarding therapy's recommendation. Patient stated she has therapy via [facility]. She refused arrangement for [facility] PT via [facility]. Patient stated she will need transport back to [facility] at discharge.          Patient Goals and CMS Choice            Expected Discharge Plan and Services                                              Prior Living Arrangements/Services                       Activities of Daily Living   ADL Screening (condition at time of admission) Independently performs ADLs?: Yes (appropriate for developmental age) Is the patient deaf or have difficulty hearing?: No Does the patient have difficulty seeing, even when wearing glasses/contacts?: No Does the patient have difficulty concentrating, remembering, or making decisions?: No  Permission Sought/Granted                  Emotional Assessment              Admission diagnosis:  Atherosclerosis of artery of extremity with rest pain [facility]) [I70.229] Patient Active Problem List   Diagnosis Date Noted   Atherosclerosis of artery of extremity with rest pain (HCC) 07/16/2023   Macrocytic anemia 01/03/2023   Transaminitis 01/03/2023   Alcohol use 01/03/2023   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   Muscle spasms of neck 04/06/2022   Cervical spondylosis 04/06/2022   Acute pain of left wrist 02/14/2022   Atherosclerosis of native arteries of extremity with rest pain (HCC) 11/20/2021   CAD (coronary artery disease) 12/10/2020   Coronary artery disease involving native coronary artery of native heart 11/30/2020   S/P drug eluting coronary stent placement 11/15/2020    Abdominal pain, LLQ (left lower quadrant) 09/28/2020   Long term current use of anticoagulant therapy 09/28/2020   Functional diarrhea 09/28/2020   Chronic venous insufficiency 04/23/2020   PAD (peripheral artery disease) (HCC) 04/19/2020   Hyperlipidemia 04/19/2020   Hypothyroidism 04/19/2020   Popliteal artery injury, left, 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   Disruption of external operation (surgical) wound, not elsewhere classified, initial encounter 11/09/2019   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) 10/29/2019   Stricture, artery (HCC) 10/29/2019   Critical lower limb ischemia (HCC) 10/13/2019   Hypothyroidism due to acquired atrophy of thyroid 04/07/2019   Syncope and collapse 04/07/2019   Senile osteoporosis 09/20/2016   Sleep disorder 07/18/2016   IGT (impaired glucose tolerance) 06/26/2015   Pap smear abnormality of cervix with ASCUS favoring dysplasia 06/26/2015   Family history of Guillain-Barre syndrome 04/26/2015   History of herpes genitalis 04/26/2015   History of HPV infection 04/26/2015   Dyslipidemia 04/17/2015   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   PMR (polymyalgia rheumatica) (HCC) 04/17/2015   Arthritis of shoulder region, 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   Collagenous  colitis 04/17/2015   PCP:  Mick Sell, MD Pharmacy:   [facility] - [place], [place] - [address] [place] [address] Phone: [phone] Fax: [phone]     Social Determinants of Health (SDOH) Social History: SDOH Screenings   Food Insecurity: No Food Insecurity (07/18/2023)  Housing: Low Risk  (07/18/2023)  Transportation Needs: No Transportation Needs (07/18/2023)  Utilities: Not At Risk (07/18/2023)  Depression (PHQ2-9): Low Risk  (01/03/2023)  Financial Resource Strain: Low Risk  (12/13/2022)   Received from [facility], [facility]  Tobacco Use: Low  Risk  (07/16/2023)   SDOH Interventions:     Readmission Risk Interventions     No data to display

## 2023-07-18 NOTE — Plan of Care (Signed)
[facility] Tonsillectomy/Adenoidectomy, Postoperative PEDS care plan entered in error.

## 2023-07-18 NOTE — Progress Notes (Signed)
Physical Therapy Treatment Patient Details Name: Melanie Browning MRN: [id number] DOB: Feb 01, 1941 Today's Date: 07/18/2023   History of Present Illness Patient is an 82 year old female s/p left femoral artery to below-the-knee popliteal artery bypass with in-situ saphenous vein graft spliced with a large proximal leg saphenous vein branch. WBAT.    PT Comments  Patient received in recliner, agrees to PT session. Requires cues for technique with sit to stand and cues to move L LE ( AP, bending knee). She requires min A to power up to standing. Initially putting no weight on L LE, progressing to partial WB due to fear/pain. Patient is able to ambulate 25 feet with RW and cga. Cues for sequencing and WB. She will continue to benefit from skilled PT to improve functional mobility and independence.     If plan is discharge home, recommend the following: A little help with walking and/or transfers;A little help with bathing/dressing/bathroom   Can travel by private vehicle      no  Equipment Recommendations  None recommended by PT    Recommendations for Other Services       Precautions / Restrictions Precautions Precautions: Fall Restrictions Weight Bearing Restrictions: Yes LLE Weight Bearing: Weight bearing as tolerated     Mobility  Bed Mobility               General bed mobility comments: NT patient up in recliner    Transfers Overall transfer level: Needs assistance Equipment used: Rolling walker (2 wheels) Transfers: Sit to/from Stand Sit to Stand: Min assist           General transfer comment: cues for hand placement    Ambulation/Gait Ambulation/Gait assistance: Contact guard assist Gait Distance (Feet): 25 Feet Assistive device: Rolling walker (2 wheels) Gait Pattern/deviations: Step-to pattern, Decreased step length - right, Decreased weight shift to left, Shuffle Gait velocity: decr     General Gait Details: patient requires cues for flat foot, and  weight bearing through left LE. She is self selecting basically NWB on L LE due to fear/pain. Heavy use of RW   Stairs             Wheelchair Mobility     Tilt Bed    Modified Rankin (Stroke Patients Only)       Balance Overall balance assessment: Needs assistance Sitting-balance support: Feet supported Sitting balance-Leahy Scale: Good     Standing balance support: Bilateral upper extremity supported, During functional activity, Reliant on assistive device for balance Standing balance-Leahy Scale: Fair Standing balance comment: good static standing, heavy reliance on AD due to pain in L LE                            Cognition Arousal: Alert Behavior During Therapy: WFL for tasks assessed/performed Overall Cognitive Status: Within Functional Limits for tasks assessed                                          Exercises Other Exercises Other Exercises: AP in long sitting    General Comments        Pertinent Vitals/Pain Pain Assessment Pain Assessment: Faces Faces Pain Scale: Hurts even more Pain Location: L groin at wound vac site Pain Descriptors / Indicators: Discomfort, Sore, Tightness Pain Intervention(s): Monitored during session, Repositioned, Premedicated before session    Home Living Family/patient  expects to be discharged to:: Private residence Living Arrangements: Spouse/significant other                      Prior Function            PT Goals (current goals can now be found in the care plan section) Acute Rehab PT Goals Patient Stated Goal: to improve pain, return home with husband PT Goal Formulation: With patient Time For Goal Achievement: 07/24/23 Potential to Achieve Goals: Good Progress towards PT goals: Progressing toward goals    Frequency    Min 1X/week      PT Plan      Co-evaluation              AM-PAC PT "6 Clicks" Mobility   Outcome Measure  Help needed turning from  your back to your side while in a flat bed without using bedrails?: A Little Help needed moving from lying on your back to sitting on the side of a flat bed without using bedrails?: A Little Help needed moving to and from a bed to a chair (including a wheelchair)?: A Little Help needed standing up from a chair using your arms (e.g., wheelchair or bedside chair)?: A Little Help needed to walk in hospital room?: A Little Help needed climbing 3-5 steps with a railing? : A Little 6 Click Score: 18    End of Session   Activity Tolerance: Patient limited by pain Patient left: Other (comment) (patient left standing at sink with OT present) Nurse Communication: Mobility status PT Visit Diagnosis: Other abnormalities of gait and mobility (R26.89);Muscle weakness (generalized) (M62.81);Difficulty in walking, not elsewhere classified (R26.2);Pain Pain - Right/Left: Left Pain - part of body: Leg     Time: 0272-5366 PT Time Calculation (min) (ACUTE ONLY): 19 min  Charges:    $Gait Training: 8-22 mins PT General Charges $$ ACUTE PT VISIT: 1 Visit                     Yamilka Lopiccolo, PT, GCS 07/18/23,10:32 AM

## 2023-07-18 NOTE — Plan of Care (Signed)
  Problem: Education: Goal: Understanding of CV disease, CV risk reduction, and recovery process will improve Outcome: Progressing Goal: Individualized Educational Video(s) Outcome: Progressing   Problem: Activity: Goal: Ability to return to baseline activity level will improve Outcome: Progressing   Problem: Cardiovascular: Goal: Ability to achieve and maintain adequate cardiovascular perfusion will improve Outcome: Progressing Goal: Vascular access site(s) Level 0-1 will be maintained Outcome: Progressing   Problem: Health Behavior/Discharge Planning: Goal: Ability to safely manage health-related needs after discharge will improve Outcome: Progressing   Problem: Education: Goal: Knowledge of General Education information will improve Description: Including pain rating scale, medication(s)/side effects and non-pharmacologic comfort measures Outcome: Progressing   Problem: Health Behavior/Discharge Planning: Goal: Ability to manage health-related needs will improve Outcome: Progressing   Problem: Clinical Measurements: Goal: Ability to maintain clinical measurements within normal limits will improve Outcome: Progressing Goal: Will remain free from infection Outcome: Progressing Goal: Diagnostic test results will improve Outcome: Progressing Goal: Respiratory complications will improve Outcome: Progressing Goal: Cardiovascular complication will be avoided Outcome: Progressing   Problem: Activity: Goal: Risk for activity intolerance will decrease Outcome: Progressing   Problem: Nutrition: Goal: Adequate nutrition will be maintained Outcome: Progressing   Problem: Coping: Goal: Level of anxiety will decrease Outcome: Progressing   Problem: Elimination: Goal: Will not experience complications related to bowel motility Outcome: Progressing Goal: Will not experience complications related to urinary retention Outcome: Progressing   Problem: Pain Managment: Goal:  General experience of comfort will improve Outcome: Progressing   Problem: Safety: Goal: Ability to remain free from injury will improve Outcome: Progressing   Problem: Skin Integrity: Goal: Risk for impaired skin integrity will decrease Outcome: Progressing   Problem: Education: Goal: Knowledge of prescribed regimen will improve Outcome: Progressing   Problem: Activity: Goal: Ability to tolerate increased activity will improve Outcome: Progressing   Problem: Bowel/Gastric: Goal: Gastrointestinal status for postoperative course will improve Outcome: Progressing   Problem: Clinical Measurements: Goal: Postoperative complications will be avoided or minimized Outcome: Progressing Goal: Signs and symptoms of graft occlusion will improve Outcome: Progressing   Problem: Skin Integrity: Goal: Demonstration of wound healing without infection will improve Outcome: Progressing   Problem: Education: Goal: Understanding of post-operative needs will improve Outcome: Progressing Goal: Individualized Educational Video(s) Outcome: Progressing   Problem: Clinical Measurements: Goal: Postoperative complications will be avoided or minimized Outcome: Progressing   Problem: Respiratory: Goal: Will regain and/or maintain adequate ventilation Outcome: Progressing

## 2023-07-18 NOTE — Progress Notes (Signed)
  Progress Note    07/18/2023 7:27 AM 2 Days Post-Op  Subjective:  Melanie Browning is an 82 yo female now POD #2 from Left femoral artery to below-the-knee popliteal artery bypass with in-situ saphenous vein graft spliced with a large proximal leg saphenous vein branch. Patient received one unit of PRBC's for HgB of 6.9 yesterday. Morning Lab was 7.8   On exam this morning the patient was anxious and had multiple questions. I answered all her questions this morning. She endorses pain at the incision sites. Dressings are intact with serous drainage noted. Patient is recovering as expected. No complaints overnight and vitals all remain stable.     Vitals:   07/18/23 0700 07/18/23 0716  BP: 123/67   Pulse: 82 81  Resp: 18 17  Temp:    SpO2: 99% 100%   Physical Exam: Cardiac:  RRR, Normal S1, S2. No murmurs appreciated.  Lungs:  Clear on auscultation throughout while being diminished in the bases.  Incisions:  Left Lower extremity. Dressings clean dry and intact.  Extremities:  Bilateral lower extremities warm to touch. Left lower extremity with surgical incisions and +1 edema. Palpable DP/PT pulses.  Abdomen:  Positive bowel sounds, soft, non tender and non distended.  Neurologic: AAOX4, Answers all questions and follows commands appropriately.   CBC    Component Value Date/Time   WBC 6.8 07/17/2023 1558   RBC 2.06 (L) 07/17/2023 1558   HGB 7.8 (L) 07/17/2023 2053   HGB 13.2 06/17/2023 0931   HCT 23.4 (L) 07/17/2023 2053   HCT 39.8 06/17/2023 0931   PLT 122 (L) 07/17/2023 1558   PLT 181 06/17/2023 0931   MCV 101.5 (H) 07/17/2023 1558   MCV 99 (H) 06/17/2023 0931   MCH 33.5 07/17/2023 1558   MCHC 33.0 07/17/2023 1558   RDW 13.7 07/17/2023 1558   RDW 13.1 06/17/2023 0931   LYMPHSABS 0.8 01/03/2023 1142   MONOABS 0.5 01/03/2023 1142   EOSABS 0.1 01/03/2023 1142   BASOSABS 0.0 01/03/2023 1142    BMET    Component Value Date/Time   NA 133 (L) 07/17/2023 0008   K 3.9  07/17/2023 0008   CL 106 07/17/2023 0008   CO2 21 (L) 07/17/2023 0008   GLUCOSE 123 (H) 07/17/2023 0008   BUN 18 07/17/2023 0008   CREATININE 0.67 07/17/2023 0008   CALCIUM 7.6 (L) 07/17/2023 0008   GFRNONAA >60 07/17/2023 0008    INR No results found for: "INR"   Intake/Output Summary (Last 24 hours) at 07/18/2023 0727 Last data filed at 07/18/2023 0720 Gross per 24 hour  Intake 1209.79 ml  Output 1350 ml  Net -140.21 ml     Assessment/Plan:  82 y.o. female is s/p  Left femoral artery to below-the-knee popliteal artery bypass with in-situ saphenous vein graft spliced with a large proximal leg saphenous vein branch.  2 Days Post-Op   PLAN: Patients requests her Full code status be changed to DNR/DNI. Completed today. Patient endorses [facility] medical has a copy of her intentions.  Continue to monitor HGB. PT/OT evaluation.  Continue OOB Okay to transfer to the floor today.   DVT prophylaxis:   Lovenox 40 mg Q 24hrs    Dunbar Buras R [facility] 07/18/2023 7:27 AM

## 2023-07-18 NOTE — Plan of Care (Signed)
  Problem: Education: Goal: Understanding of CV disease, CV risk reduction, and recovery process will improve Outcome: Progressing   Problem: Activity: Goal: Ability to return to baseline activity level will improve Outcome: Progressing   Problem: Cardiovascular: Goal: Vascular access site(s) Level 0-1 will be maintained Outcome: Progressing   Problem: Health Behavior/Discharge Planning: Goal: Ability to safely manage health-related needs after discharge will improve Outcome: Progressing

## 2023-07-18 NOTE — Progress Notes (Signed)
Assisted patient to bedside commode under her own power. Took her time, dangled at edge of bed, no dizziness, no issues with repositioning just needs extra time and cord/equipment management.   Within conversation patient stated she is a DNR/DNI at [facility] and should be here as well. Dr. Sampson Goon, her physician through [facility] signed her DNR/DNI form which [facility] keeps. She is also registered with [employer]. She stated that she would absolutely expect to be a DNR/DNI while here at the hospital and arranged everything to be in place specifically prior to this surgery and admission. She requested that someone contact [facility] to see if they could provide a copy of her paperwork so [facility] could also have it on file.  The patient provided Lucrezia Starch at [facility] as the point of contact.  Work: [phone] Cell: [phone]  Dianne Person with [facility] [employer] Work: [phone]

## 2023-07-19 LAB — BASIC METABOLIC PANEL
Anion gap: 5 (ref 5–15)
BUN: 14 mg/dL (ref 8–23)
CO2: 25 mmol/L (ref 22–32)
Calcium: 8.1 mg/dL — ABNORMAL LOW (ref 8.9–10.3)
Chloride: 105 mmol/L (ref 98–111)
Creatinine, Ser: 0.66 mg/dL (ref 0.44–1.00)
GFR, Estimated: >60 mL/min (ref >60)
Glucose, Bld: 95 mg/dL (ref 70–99)
Potassium: 3.8 mmol/L (ref 3.5–5.1)
Sodium: 135 mmol/L (ref 135–145)

## 2023-07-19 LAB — CBC
HCT: 22.5 % — ABNORMAL LOW (ref 36.0–46.0)
Hemoglobin: 7.6 g/dL — ABNORMAL LOW (ref 12.0–15.0)
MCH: 33.5 pg (ref 26.0–34.0)
MCHC: 33.8 g/dL (ref 30.0–36.0)
MCV: 99.1 fL (ref 80.0–100.0)
Platelets: 142 10*3/uL — ABNORMAL LOW (ref 150–400)
RBC: 2.27 MIL/uL — ABNORMAL LOW (ref 3.87–5.11)
RDW: 13.9 % (ref 11.5–15.5)
WBC: 7.2 10*3/uL (ref 4.0–10.5)
nRBC: 0 % (ref 0.0–0.2)

## 2023-07-19 NOTE — Progress Notes (Signed)
PT Cancellation Note  Patient Details Name: Melanie Browning MRN: [id number] DOB: 11/25/1940   Cancelled Treatment:    Reason Eval/Treat Not Completed: Fatigue/lethargy limiting ability to participate  Pt had been up several times with nursing and felt weak and light headed. Pt declined PT.  Will continue to follow up with pt for PT.   Hortencia Conradi, PTA  07/19/23, 2:10 PM

## 2023-07-19 NOTE — Progress Notes (Signed)
3 Days Post-Op   Subjective/Chief Complaint: Patient feeling OK- somewhat fatigued. States pain is well controlled. Able to have a BM. Otherwise without complaint.   Objective: Vital signs in last 24 hours: Temp:  [98.3 F (36.8 C)-98.6 F (37 C)] 98.6 F (37 C) (10/19 4098) Pulse Rate:  [78-97] 86 (10/19 0938) Resp:  [16-18] 16 (10/19 0938) BP: (115-158)/(51-77) 115/55 (10/19 0938) SpO2:  [97 %-100 %] 97 % (10/19 0938) Last BM Date : 07/19/23  Intake/Output from previous day: 10/18 0701 - 10/19 0700 In: 240 [P.O.:240] Out: 0  Intake/Output this shift: No intake/output data recorded.  General appearance: alert and no distress Resp: clear to auscultation bilaterally Cardio: regular rate and rhythm Extremities: LEFT lower extremity- warm, Prevena in place, dressing on remainder of leg intact, scant serosang drainge, calf soft, foot warm, hyperemic  Lab Results:  Recent Labs    07/18/23 1636 07/19/23 0506  WBC 8.9 7.2  HGB 8.8* 7.6*  HCT 26.4* 22.5*  PLT 144* 142*   BMET Recent Labs    07/17/23 0008 07/19/23 0506  NA 133* 135  K 3.9 3.8  CL 106 105  CO2 21* 25  GLUCOSE 123* 95  BUN 18 14  CREATININE 0.67 0.66  CALCIUM 7.6* 8.1*   PT/INR No results for input(s): "LABPROT", "INR" in the last 72 hours. ABG No results for input(s): "PHART", "HCO3" in the last 72 hours.  Invalid input(s): "PCO2", "PO2"  Studies/Results: No results found.  Anti-infectives: Anti-infectives (From admission, onward)    Start     Dose/Rate Route Frequency Ordered Stop   07/16/23 1600  ceFAZolin (ANCEF) IVPB 2g/100 mL premix        2 g 200 mL/hr over 30 Minutes Intravenous Every 8 hours 07/16/23 1403 07/16/23 2346   07/16/23 0630  ceFAZolin (ANCEF) IVPB 2g/100 mL premix        2 g 200 mL/hr over 30 Minutes Intravenous On call to O.R. 07/16/23 0615 07/16/23 0800       Assessment/Plan: s/p Procedure(s): BYPASS GRAFT FEMORAL-POPLITEAL ARTERY (FEMORAL-POSTERIOR TIBIAL  BYPASS W/ SAPHENOUS VEIN- 11914) (Left) APPLICATION OF CELL SAVER (N/A) OOB with PT as tolerated ASA/Brilinta Pain control Encourage PO Monitor HgB - trending down now at 7.6. Patient states she is hesitant for an additional transfusion(banked products). I explained that unless she was below 7.0 and/or symptomatic we would continue to monitor.  LOS: 3 days    Eli Hose A 07/19/2023

## 2023-07-20 LAB — COMPREHENSIVE METABOLIC PANEL
ALT: 17 U/L (ref 0–44)
AST: 30 U/L (ref 15–41)
Albumin: 3.1 g/dL — ABNORMAL LOW (ref 3.5–5.0)
Alkaline Phosphatase: 41 U/L (ref 38–126)
Anion gap: 9 (ref 5–15)
BUN: 15 mg/dL (ref 8–23)
CO2: 22 mmol/L (ref 22–32)
Calcium: 8.3 mg/dL — ABNORMAL LOW (ref 8.9–10.3)
Chloride: 100 mmol/L (ref 98–111)
Creatinine, Ser: 0.67 mg/dL (ref 0.44–1.00)
GFR, Estimated: >60 mL/min (ref >60)
Glucose, Bld: 99 mg/dL (ref 70–99)
Potassium: 3.5 mmol/L (ref 3.5–5.1)
Sodium: 131 mmol/L — ABNORMAL LOW (ref 135–145)
Total Bilirubin: 0.9 mg/dL (ref 0.3–1.2)
Total Protein: 6.2 g/dL — ABNORMAL LOW (ref 6.5–8.1)

## 2023-07-20 LAB — CBC
HCT: 27.9 % — ABNORMAL LOW (ref 36.0–46.0)
Hemoglobin: 9.1 g/dL — ABNORMAL LOW (ref 12.0–15.0)
MCH: 33 pg (ref 26.0–34.0)
MCHC: 32.6 g/dL (ref 30.0–36.0)
MCV: 101.1 fL — ABNORMAL HIGH (ref 80.0–100.0)
Platelets: 206 10*3/uL (ref 150–400)
RBC: 2.76 MIL/uL — ABNORMAL LOW (ref 3.87–5.11)
RDW: 13.7 % (ref 11.5–15.5)
WBC: 6.3 10*3/uL (ref 4.0–10.5)
nRBC: 0 % (ref 0.0–0.2)

## 2023-07-20 MED ORDER — BISACODYL 5 MG PO TBEC
5.0000 mg | DELAYED_RELEASE_TABLET | Freq: Every day | ORAL | Status: DC | PRN
  Administered 2023-07-24: 5 mg via ORAL
  Filled 2023-07-20: qty 1

## 2023-07-20 MED ORDER — BUDESONIDE 3 MG PO CPEP
9.0000 mg | ORAL_CAPSULE | Freq: Every day | ORAL | Status: DC
  Administered 2023-07-21 – 2023-07-23 (×3): 9 mg via ORAL
  Filled 2023-07-20 (×5): qty 3

## 2023-07-20 NOTE — Plan of Care (Signed)
  Problem: Education: Goal: Understanding of CV disease, CV risk reduction, and recovery process will improve Outcome: Progressing Goal: Individualized Educational Video(s) Outcome: Progressing   Problem: Activity: Goal: Ability to return to baseline activity level will improve Outcome: Progressing   Problem: Cardiovascular: Goal: Ability to achieve and maintain adequate cardiovascular perfusion will improve Outcome: Progressing Goal: Vascular access site(s) Level 0-1 will be maintained Outcome: Progressing   Problem: Health Behavior/Discharge Planning: Goal: Ability to safely manage health-related needs after discharge will improve Outcome: Progressing   Problem: Education: Goal: Knowledge of General Education information will improve Description: Including pain rating scale, medication(s)/side effects and non-pharmacologic comfort measures Outcome: Progressing   Problem: Health Behavior/Discharge Planning: Goal: Ability to manage health-related needs will improve Outcome: Progressing   Problem: Clinical Measurements: Goal: Ability to maintain clinical measurements within normal limits will improve Outcome: Progressing Goal: Will remain free from infection Outcome: Progressing Goal: Diagnostic test results will improve Outcome: Progressing Goal: Respiratory complications will improve Outcome: Progressing Goal: Cardiovascular complication will be avoided Outcome: Progressing   Problem: Activity: Goal: Risk for activity intolerance will decrease Outcome: Progressing   Problem: Nutrition: Goal: Adequate nutrition will be maintained Outcome: Progressing   Problem: Coping: Goal: Level of anxiety will decrease Outcome: Progressing   Problem: Elimination: Goal: Will not experience complications related to bowel motility Outcome: Progressing Goal: Will not experience complications related to urinary retention Outcome: Progressing   Problem: Pain Managment: Goal:  General experience of comfort will improve Outcome: Progressing   Problem: Safety: Goal: Ability to remain free from injury will improve Outcome: Progressing   Problem: Skin Integrity: Goal: Risk for impaired skin integrity will decrease Outcome: Progressing   Problem: Education: Goal: Knowledge of prescribed regimen will improve Outcome: Progressing   Problem: Activity: Goal: Ability to tolerate increased activity will improve Outcome: Progressing   Problem: Bowel/Gastric: Goal: Gastrointestinal status for postoperative course will improve Outcome: Progressing   Problem: Clinical Measurements: Goal: Postoperative complications will be avoided or minimized Outcome: Progressing Goal: Signs and symptoms of graft occlusion will improve Outcome: Progressing   Problem: Skin Integrity: Goal: Demonstration of wound healing without infection will improve Outcome: Progressing   

## 2023-07-20 NOTE — Progress Notes (Signed)
Patient has active telemetry orders but was not applied since transfer from ICU. Notified Dr.Esco, I was given orders to discontinue telemetry.

## 2023-07-20 NOTE — Progress Notes (Signed)
4 Days Post-Op   Subjective/Chief Complaint: Doing better today. Pain well controlled. OOB/ambulating yesterday with nursing   Objective: Vital signs in last 24 hours: Temp:  [97.9 F (36.6 C)-98.4 F (36.9 C)] 97.9 F (36.6 C) (10/20 0840) Pulse Rate:  [74-109] 74 (10/20 0840) Resp:  [15-18] 18 (10/20 0840) BP: (101-116)/(56-89) 116/60 (10/20 0840) SpO2:  [98 %-100 %] 98 % (10/20 0840) Last BM Date : 07/19/23  Intake/Output from previous day: No intake/output data recorded. Intake/Output this shift: No intake/output data recorded.  General appearance: alert and no distress Cardio: regular rate and rhythm Extremities: LEFT lower extremity- warm to toes, mild edema, dressings intact, serosang on dressings, thigh/calf soft, palpable DP  Lab Results:  Recent Labs    07/19/23 0506 07/20/23 0503  WBC 7.2 6.3  HGB 7.6* 9.1*  HCT 22.5* 27.9*  PLT 142* 206   BMET Recent Labs    07/19/23 0506 07/20/23 0503  NA 135 131*  K 3.8 3.5  CL 105 100  CO2 25 22  GLUCOSE 95 99  BUN 14 15  CREATININE 0.66 0.67  CALCIUM 8.1* 8.3*   PT/INR No results for input(s): "LABPROT", "INR" in the last 72 hours. ABG No results for input(s): "PHART", "HCO3" in the last 72 hours.  Invalid input(s): "PCO2", "PO2"  Studies/Results: No results found.  Anti-infectives: Anti-infectives (From admission, onward)    Start     Dose/Rate Route Frequency Ordered Stop   07/16/23 1600  ceFAZolin (ANCEF) IVPB 2g/100 mL premix        2 g 200 mL/hr over 30 Minutes Intravenous Every 8 hours 07/16/23 1403 07/16/23 2346   07/16/23 0630  ceFAZolin (ANCEF) IVPB 2g/100 mL premix        2 g 200 mL/hr over 30 Minutes Intravenous On call to O.R. 07/16/23 0615 07/16/23 0800       Assessment/Plan: s/p Procedure(s): BYPASS GRAFT FEMORAL-POPLITEAL ARTERY (FEMORAL-POSTERIOR TIBIAL BYPASS W/ SAPHENOUS VEIN- 66440) (Left) APPLICATION OF CELL SAVER (N/A) Continue OOB ambulate Encourage PO Bowel  regimen ASA/Brilinta HgB stable now 9 Dispo in the next few days   LOS: 4 days    Eli Hose A 07/20/2023

## 2023-07-21 NOTE — Progress Notes (Signed)
Occupational Therapy Treatment Patient Details Name: Melanie Browning MRN: [id number] DOB: [birth year] Today's Date: 07/21/2023   History of present illness Patient is an 82 year old female s/p left femoral artery to below-the-knee popliteal artery bypass with in-situ saphenous vein graft spliced with a large proximal leg saphenous vein branch. WBAT.   OT comments  Pt seen today for OT tx, premedicated for pain mgmt. Reports 0/10 pain at rest, often increasing to 5/10. Requires supervision for bed mobility with increased time, CGA for sit <> stand using RW, stands at sink to perform grooming tasks with intermittent UE support. Steps laterally toward HOB ~4 steps before returning to seated, supervision for sit > supine. Pt often reporting 10/10 pain with mobilization; however, reports 0/10 pain end of session. Pleasant and motivated. Pt making good progress toward goals, will continue to follow POC. Discharge recommendation remains appropriate.        If plan is discharge home, recommend the following:  A little help with walking and/or transfers;A little help with bathing/dressing/bathroom;Help with stairs or ramp for entrance   Equipment Recommendations  BSC/3in1    Recommendations for Other Services Other (comment)    Precautions / Restrictions Precautions Precautions: Fall Restrictions Weight Bearing Restrictions: Yes LLE Weight Bearing: Weight bearing as tolerated Other Position/Activity Restrictions: Pt self-selects NWB or TWB       Mobility Bed Mobility Overal bed mobility: Needs Assistance Bed Mobility: Supine to Sit, Sit to Supine     Supine to sit: Supervision Sit to supine: Supervision   General bed mobility comments: increased time, cues for sequencing for pain reduction    Transfers Overall transfer level: Needs assistance Equipment used: Rolling walker (2 wheels) Transfers: Sit to/from Stand Sit to Stand: Contact guard assist                 Balance  Overall balance assessment: Needs assistance Sitting-balance support: Feet supported Sitting balance-Leahy Scale: Good     Standing balance support: No upper extremity supported, During functional activity Standing balance-Leahy Scale: Fair Standing balance comment: heavy reliance on counter/RW for UE support with weight primarily on RLE                           ADL either performed or assessed with clinical judgement   ADL Overall ADL's : Needs assistance/impaired     Grooming: Wash/dry hands;Wash/dry face;Standing Grooming Details (indicate cue type and reason): Sink level, using RW for intermittent UE support                             Functional mobility during ADLs: Rolling walker (2 wheels)        Cognition Arousal: Alert Behavior During Therapy: WFL for tasks assessed/performed Overall Cognitive Status: Within Functional Limits for tasks assessed                                                General Comments Wound vac intact start/end of session    Pertinent Vitals/ Pain       Pain Assessment Pain Assessment: 0-10 Pain Score: 5  Pain Location: L groin at wound vac site Pain Descriptors / Indicators: Discomfort, Sore, Tightness Pain Intervention(s): Limited activity within patient's tolerance, Premedicated before session, Monitored during session  Frequency  Min 1X/week        Progress Toward Goals  OT Goals(current goals can now be found in the care plan section)  Progress towards OT goals: Progressing toward goals  Acute Rehab OT Goals Patient Stated Goal: to return to working out OT Goal Formulation: With patient Time For Goal Achievement: 07/31/23 Potential to Achieve Goals: Good  Plan         AM-PAC OT "6 Clicks" Daily Activity     Outcome Measure   Help from another person eating meals?: None Help from another person taking care of personal grooming?: A Little Help from another person  toileting, which includes using toliet, bedpan, or urinal?: A Little Help from another person bathing (including washing, rinsing, drying)?: A Lot Help from another person to put on and taking off regular upper body clothing?: None Help from another person to put on and taking off regular lower body clothing?: A Lot 6 Click Score: 18    End of Session Equipment Utilized During Treatment: Rolling walker (2 wheels);Gait belt  OT Visit Diagnosis: Other abnormalities of gait and mobility (R26.89);Muscle weakness (generalized) (M62.81)   Activity Tolerance Patient tolerated treatment well   Patient Left in bed;with call bell/phone within reach;with bed alarm set;with SCD's reapplied (R SCD applied only)   Nurse Communication Mobility status        Time: 7425-9563 OT Time Calculation (min): 28 min  Charges: OT General Charges $OT Visit: 1 Visit OT Treatments $Self Care/Home Management : 23-37 mins Melanie Browning L. Takera Rayl, OTR/L  07/21/23, 4:15 PM

## 2023-07-21 NOTE — Progress Notes (Signed)
OT Cancellation Note  Patient Details Name: [name] MRN: [id number] DOB: May 01, 1941   Cancelled Treatment:    Reason Eval/Treat Not Completed: Pain limiting ability to participate. Discussed with NT - pt currently declining OOB mobility, stating pain despite recent pain meds. OT will re-attempt as able when pt is able to tolerate OT intervention.   Brenleigh Collet L. Taysha Majewski, OTR/L  07/21/23, 11:19 AM

## 2023-07-21 NOTE — TOC Progression Note (Signed)
Transition of Care [facility]) - Progression Note    Patient Details  Name: Melanie Browning MRN: [id number] Date of Birth: 1940/11/16  Transition of Care [facility]) CM/SW Contact  Marlowe Sax, RN Phone Number: 07/21/2023, 4:54 PM  Clinical Narrative:    I met with the patient in the room She lives in Independent living at [facility] with her husband She does not feel that her husband will be able to assist her at all at home due to his own medical issues and needs They have dogs that are currently being boarded  I let her know that the current PT recommendation is [facility] PT, I did mention that if she feels she would need or benefit from STR she has the option of going to [facility] since she lives there  She stated that she needs to think about what she will need and what her DC plan needs to be, she will talk to me tomorrow and let me know      Expected Discharge Plan and Services                                               Social Determinants of Health (SDOH) Interventions SDOH Screenings   Food Insecurity: No Food Insecurity (07/18/2023)  Housing: Low Risk  (07/18/2023)  Transportation Needs: No Transportation Needs (07/18/2023)  Utilities: Not At Risk (07/18/2023)  Depression (PHQ2-9): Low Risk  (01/03/2023)  Financial Resource Strain: Low Risk  (12/13/2022)   Received from [facility], [facility]  Tobacco Use: Low Risk  (07/16/2023)    Readmission Risk Interventions     No data to display

## 2023-07-21 NOTE — Plan of Care (Signed)
  Problem: Education: Goal: Understanding of CV disease, CV risk reduction, and recovery process will improve Outcome: Progressing   Problem: Activity: Goal: Ability to return to baseline activity level will improve Outcome: Progressing   Problem: Clinical Measurements: Goal: Respiratory complications will improve Outcome: Adequate for Discharge Goal: Cardiovascular complication will be avoided Outcome: Adequate for Discharge

## 2023-07-21 NOTE — Progress Notes (Signed)
[facility]  Daily Progress Note   Subjective  - 5 Days Post-Op  Patient notes she just does not feel well today.  Her leg is more painful today particularly in the thigh area than the previous days.  No shaking chills or sweats.  Objective Vitals:   07/20/23 2301 07/21/23 0814 07/21/23 0935 07/21/23 1601  BP: 111/62 133/81  (!) 119/55  Pulse: 76 (!) 49  71  Resp: 19 18  18   Temp: 98 F (36.7 C) 98.2 F (36.8 C)  98.2 F (36.8 C)  TempSrc:      SpO2: 98% (!) 86% 100% 100%  Weight:      Height:        Intake/Output Summary (Last 24 hours) at 07/21/2023 1743 Last data filed at 07/21/2023 1027 Gross per 24 hour  Intake 120 ml  Output 0 ml  Net 120 ml    PULM  Normal effort , no use of accessory muscles CV  No JVD, RRR Abd      No distended, nontender VASC  the left lower extremity is more edematous than my previous exam the skin from the knee to the ankle is just a bit shiny the foot itself is hyperemic and warm to the touch and there is a palpable DP pulse.  Dressings are clean dry and intact with some old blood staining.  Prevena is maintaining its seal in the left groin.  Laboratory CBC    Component Value Date/Time   WBC 6.3 07/20/2023 0503   HGB 9.1 (L) 07/20/2023 0503   HGB 13.2 06/17/2023 0931   HCT 27.9 (L) 07/20/2023 0503   HCT 39.8 06/17/2023 0931   PLT 206 07/20/2023 0503   PLT 181 06/17/2023 0931    BMET    Component Value Date/Time   NA 131 (L) 07/20/2023 0503   K 3.5 07/20/2023 0503   CL 100 07/20/2023 0503   CO2 22 07/20/2023 0503   GLUCOSE 99 07/20/2023 0503   BUN 15 07/20/2023 0503   CREATININE 0.67 07/20/2023 0503   CALCIUM 8.3 (L) 07/20/2023 0503   GFRNONAA >60 07/20/2023 0503    Assessment/Planning: POD # 5 s/p left femoral posterior tibial bypass with composite in situ vein  Patient is not ready for discharge.  We will continue therapy I will place a thigh-high TED for some support for relief of the edema.  I  will plan to change the dressings tomorrow.  We will continue to evaluate her labs.   Levora Dredge  07/21/2023, 5:43 PM

## 2023-07-21 NOTE — Plan of Care (Signed)
  Problem: Education: Goal: Understanding of CV disease, CV risk reduction, and recovery process will improve Outcome: Progressing Goal: Individualized Educational Video(s) Outcome: Progressing   Problem: Activity: Goal: Ability to return to baseline activity level will improve Outcome: Progressing   Problem: Cardiovascular: Goal: Ability to achieve and maintain adequate cardiovascular perfusion will improve Outcome: Progressing Goal: Vascular access site(s) Level 0-1 will be maintained Outcome: Progressing   Problem: Health Behavior/Discharge Planning: Goal: Ability to safely manage health-related needs after discharge will improve Outcome: Progressing   Problem: Education: Goal: Knowledge of General Education information will improve Description: Including pain rating scale, medication(s)/side effects and non-pharmacologic comfort measures Outcome: Progressing   Problem: Health Behavior/Discharge Planning: Goal: Ability to manage health-related needs will improve Outcome: Progressing   Problem: Clinical Measurements: Goal: Ability to maintain clinical measurements within normal limits will improve Outcome: Progressing Goal: Will remain free from infection Outcome: Progressing Goal: Diagnostic test results will improve Outcome: Progressing Goal: Respiratory complications will improve Outcome: Progressing Goal: Cardiovascular complication will be avoided Outcome: Progressing   Problem: Activity: Goal: Risk for activity intolerance will decrease Outcome: Progressing   Problem: Nutrition: Goal: Adequate nutrition will be maintained Outcome: Progressing   Problem: Coping: Goal: Level of anxiety will decrease Outcome: Progressing   Problem: Elimination: Goal: Will not experience complications related to bowel motility Outcome: Progressing Goal: Will not experience complications related to urinary retention Outcome: Progressing   Problem: Pain Managment: Goal:  General experience of comfort will improve Outcome: Progressing   Problem: Safety: Goal: Ability to remain free from injury will improve Outcome: Progressing   Problem: Skin Integrity: Goal: Risk for impaired skin integrity will decrease Outcome: Progressing   Problem: Education: Goal: Knowledge of prescribed regimen will improve Outcome: Progressing   Problem: Activity: Goal: Ability to tolerate increased activity will improve Outcome: Progressing   Problem: Bowel/Gastric: Goal: Gastrointestinal status for postoperative course will improve Outcome: Progressing   Problem: Clinical Measurements: Goal: Postoperative complications will be avoided or minimized Outcome: Progressing Goal: Signs and symptoms of graft occlusion will improve Outcome: Progressing   Problem: Skin Integrity: Goal: Demonstration of wound healing without infection will improve Outcome: Progressing   

## 2023-07-22 NOTE — TOC Progression Note (Signed)
Transition of Care [facility]) - Progression Note    Patient Details  Name: Melanie Browning MRN: [id number] Date of Birth: 28-Dec-1940  [facility]) CM/SW Contact  Marlowe Sax, RN Phone Number: 07/22/2023, 2:38 PM  Clinical Narrative:    Met with the patient in the room and discussed her options, She has determined based on her abilities and home situation that she would do better if she goes to STR at [facility], I notified Sue Lush at [facility]        Expected Discharge Plan and Services                                               Social Determinants of Health (SDOH) Interventions SDOH Screenings   Food Insecurity: No Food Insecurity (07/18/2023)  Housing: Low Risk  (07/18/2023)  Transportation Needs: No Transportation Needs (07/18/2023)  Utilities: Not At Risk (07/18/2023)  Depression (PHQ2-9): Low Risk  (01/03/2023)  Financial Resource Strain: Low Risk  (12/13/2022)   Received from [facility], [facility]  Tobacco Use: Low Risk  (07/16/2023)    Readmission Risk Interventions     No data to display

## 2023-07-22 NOTE — Plan of Care (Signed)
Pt dealing with anxiety over the status of her left lower extremity.  Problem: Education: Goal: Understanding of CV disease, CV risk reduction, and recovery process will improve Outcome: Progressing Goal: Individualized Educational Video(s) Outcome: Progressing   Problem: Activity: Goal: Ability to return to baseline activity level will improve Outcome: Progressing   Problem: Cardiovascular: Goal: Ability to achieve and maintain adequate cardiovascular perfusion will improve Outcome: Progressing Goal: Vascular access site(s) Level 0-1 will be maintained Outcome: Progressing   Problem: Health Behavior/Discharge Planning: Goal: Ability to safely manage health-related needs after discharge will improve Outcome: Progressing   Problem: Education: Goal: Knowledge of General Education information will improve Description: Including pain rating scale, medication(s)/side effects and non-pharmacologic comfort measures Outcome: Progressing   Problem: Health Behavior/Discharge Planning: Goal: Ability to manage health-related needs will improve Outcome: Progressing   Problem: Clinical Measurements: Goal: Ability to maintain clinical measurements within normal limits will improve Outcome: Progressing Goal: Will remain free from infection Outcome: Progressing Goal: Diagnostic test results will improve Outcome: Progressing Goal: Respiratory complications will improve Outcome: Progressing Goal: Cardiovascular complication will be avoided Outcome: Progressing   Problem: Activity: Goal: Risk for activity intolerance will decrease Outcome: Progressing   Problem: Nutrition: Goal: Adequate nutrition will be maintained Outcome: Progressing   Problem: Coping: Goal: Level of anxiety will decrease Outcome: Progressing   Problem: Elimination: Goal: Will not experience complications related to bowel motility Outcome: Progressing Goal: Will not experience complications related to urinary  retention Outcome: Progressing   Problem: Pain Managment: Goal: General experience of comfort will improve Outcome: Progressing   Problem: Safety: Goal: Ability to remain free from injury will improve Outcome: Progressing   Problem: Skin Integrity: Goal: Risk for impaired skin integrity will decrease Outcome: Progressing   Problem: Education: Goal: Knowledge of prescribed regimen will improve Outcome: Progressing   Problem: Activity: Goal: Ability to tolerate increased activity will improve Outcome: Progressing   Problem: Bowel/Gastric: Goal: Gastrointestinal status for postoperative course will improve Outcome: Progressing   Problem: Clinical Measurements: Goal: Postoperative complications will be avoided or minimized Outcome: Progressing Goal: Signs and symptoms of graft occlusion will improve Outcome: Progressing   Problem: Skin Integrity: Goal: Demonstration of wound healing without infection will improve Outcome: Progressing

## 2023-07-22 NOTE — NC FL2 (Signed)
[place] MEDICAID FL2 LEVEL OF CARE FORM     IDENTIFICATION  Patient Name: Melanie Browning Birthdate: 12/17/1940 Sex: female Admission Date (Current Location): 07/16/2023  [facility] and [place] Number:  Chiropodist and Address:  [facility], [address] [place], [place] [address]      Provider Number: [id number]  Attending Physician Name and Address:  Renford Dills, MD  Relative Name and Phone Number:  Cher Nakai  [phone]    Current Level of Care: Hospital Recommended Level of Care: Skilled Nursing Facility Prior Approval Number:    Date Approved/Denied:   PASRR Number:    Discharge Plan: SNF    Current Diagnoses: Patient Active Problem List   Diagnosis Date Noted   Atherosclerosis of artery of extremity with rest pain (HCC) 07/16/2023   Macrocytic anemia 01/03/2023   Transaminitis 01/03/2023   Alcohol use 01/03/2023   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   Muscle spasms of neck 04/06/2022   Cervical spondylosis 04/06/2022   Acute pain of left wrist 02/14/2022   Atherosclerosis of native arteries of extremity with rest pain (HCC) 11/20/2021   CAD (coronary artery disease) 12/10/2020   Coronary artery disease involving native coronary artery of native heart 11/30/2020   S/P drug eluting coronary stent placement 11/15/2020   Abdominal pain, LLQ (left lower quadrant) 09/28/2020   Long term current use of anticoagulant therapy 09/28/2020   Functional diarrhea 09/28/2020   Chronic venous insufficiency 04/23/2020   PAD (peripheral artery disease) (HCC) 04/19/2020   Hyperlipidemia 04/19/2020   Hypothyroidism 04/19/2020   Popliteal artery injury, left, 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   Disruption of external operation (surgical) wound, not elsewhere classified, initial encounter 11/09/2019   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) 10/29/2019    Stricture, artery (HCC) 10/29/2019   Critical lower limb ischemia (HCC) 10/13/2019   Hypothyroidism due to acquired atrophy of thyroid 04/07/2019   Syncope and collapse 04/07/2019   Senile osteoporosis 09/20/2016   Sleep disorder 07/18/2016   IGT (impaired glucose tolerance) 06/26/2015   Pap smear abnormality of cervix with ASCUS favoring dysplasia 06/26/2015   Family history of Guillain-Barre syndrome 04/26/2015   History of herpes genitalis 04/26/2015   History of HPV infection 04/26/2015   Dyslipidemia 04/17/2015   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   PMR (polymyalgia rheumatica) (HCC) 04/17/2015   Arthritis of shoulder region, 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    Orientation RESPIRATION BLADDER Height & Weight     Self, Time, Situation, Place  Normal Continent Weight: 73.8 kg Height:  5\' 8"  (172.7 cm)  BEHAVIORAL SYMPTOMS/MOOD NEUROLOGICAL BOWEL NUTRITION STATUS      Continent Diet (see dc summary)  AMBULATORY STATUS COMMUNICATION OF NEEDS Skin   Extensive Assist Verbally Normal, Surgical wounds                       Personal Care Assistance Level of Assistance  Bathing, Feeding, Dressing Bathing Assistance: Limited assistance Feeding assistance: Independent Dressing Assistance: Limited assistance     Functional Limitations Info  Sight, Hearing, Speech Sight Info: Adequate Hearing Info: Adequate Speech Info: Adequate    SPECIAL CARE FACTORS FREQUENCY  PT (By licensed PT), OT (By licensed OT)     PT Frequency: 5 times per week OT Frequency: 5 times per week            Contractures Contractures Info: Not present    Additional  Factors Info  Code Status, Allergies Code Status Info: DNR Allergies Info: Lactose           Current Medications (07/22/2023):  This is the current hospital active medication list Current Facility-Administered Medications  Medication Dose Route Frequency Provider Last Rate Last Admin   0.9 %  sodium  chloride infusion (Manually program via Guardrails IV Fluids)   Intravenous Once Rolla Plate R, NP       acetaminophen (TYLENOL) tablet 325-650 mg  325-650 mg Oral Q4H PRN Schnier, Latina Craver, MD       Or   acetaminophen (TYLENOL) suppository 325-650 mg  325-650 mg Rectal Q4H PRN Schnier, Latina Craver, MD       alum & mag hydroxide-simeth (MAALOX/MYLANTA) 200-200-20 MG/5ML suspension 15-30 mL  15-30 mL Oral Q2H PRN Schnier, Latina Craver, MD       aspirin EC tablet 81 mg  81 mg Oral QPM Schnier, Latina Craver, MD   81 mg at 07/21/23 1704   bisacodyl (DULCOLAX) EC tablet 5 mg  5 mg Oral Daily PRN Esco, Miechia A, MD       budesonide (ENTOCORT EC) 24 hr capsule 9 mg  9 mg Oral Daily Esco, Miechia A, MD   9 mg at 07/22/23 1003   Chlorhexidine Gluconate Cloth 2 % PADS 6 each  6 each Topical Daily Schnier, Latina Craver, MD   6 each at 07/22/23 1004   diphenoxylate-atropine (LOMOTIL) 2.5-0.025 MG per tablet 1 tablet  1 tablet Oral QID PRN Schnier, Latina Craver, MD   1 tablet at 07/20/23 2006   docusate sodium (COLACE) capsule 100 mg  100 mg Oral Daily Schnier, Latina Craver, MD   100 mg at 07/19/23 1019   DOPamine (INTROPIN) 800 mg in dextrose 5 % 250 mL (3.2 mg/mL) infusion  3-5 mcg/kg/min Intravenous Continuous Schnier, Latina Craver, MD   Held at 07/16/23 1946   enoxaparin (LOVENOX) injection 40 mg  40 mg Subcutaneous Q24H Schnier, Latina Craver, MD   40 mg at 07/22/23 0813   famotidine (PEPCID) tablet 20 mg  20 mg Oral BID Tressie Ellis, RPH   20 mg at 07/22/23 1003   guaiFENesin-dextromethorphan (ROBITUSSIN DM) 100-10 MG/5ML syrup 15 mL  15 mL Oral Q4H PRN Schnier, Latina Craver, MD       hydrALAZINE (APRESOLINE) injection 5 mg  5 mg Intravenous Q20 Min PRN Schnier, Latina Craver, MD       labetalol (NORMODYNE) injection 10 mg  10 mg Intravenous Q10 min PRN Schnier, Latina Craver, MD       levothyroxine (SYNTHROID) tablet 75 mcg  75 mcg Oral Q0600 Renford Dills, MD   75 mcg at 07/22/23 0505   losartan (COZAAR) tablet 25 mg  25 mg  Oral Daily Schnier, Latina Craver, MD   25 mg at 07/22/23 1003   magnesium sulfate IVPB 2 g 50 mL  2 g Intravenous Daily PRN Schnier, Latina Craver, MD       metoprolol tartrate (LOPRESSOR) injection 2-5 mg  2-5 mg Intravenous Q2H PRN Schnier, Latina Craver, MD       morphine (PF) 2 MG/ML injection 2-5 mg  2-5 mg Intravenous Q1H PRN Schnier, Latina Craver, MD   2 mg at 07/22/23 0814   nitroGLYCERIN (NITROSTAT) SL tablet 0.4 mg  0.4 mg Sublingual Q5 min PRN Schnier, Latina Craver, MD       nitroGLYCERIN 50 mg in dextrose 5 % 250 mL (0.2 mg/mL) infusion  5-250 mcg/min Intravenous Titrated Schnier, Earl Lites  G, MD   Held at 07/16/23 1946   ondansetron (ZOFRAN) injection 4 mg  4 mg Intravenous Q6H PRN Schnier, Latina Craver, MD   4 mg at 07/21/23 1610   oxyCODONE-acetaminophen (PERCOCET/ROXICET) 5-325 MG per tablet 1-2 tablet  1-2 tablet Oral Q4H PRN Schnier, Latina Craver, MD   2 tablet at 07/22/23 1125   phenol (CHLORASEPTIC) mouth spray 1 spray  1 spray Mouth/Throat PRN Schnier, Latina Craver, MD       potassium chloride SA (KLOR-CON M) CR tablet 20-40 mEq  20-40 mEq Oral Daily PRN Schnier, Latina Craver, MD       senna-docusate (Senokot-S) tablet 1 tablet  1 tablet Oral QHS PRN Schnier, Latina Craver, MD       ticagrelor [facility]) tablet 90 mg  90 mg Oral BID Otelia Sergeant, RPH   90 mg at 07/22/23 1003     Discharge Medications: Please see discharge summary for a list of discharge medications.  Relevant Imaging Results:  Relevant Lab Results:   Additional Information [id number]  Marlowe Sax, RN

## 2023-07-22 NOTE — Plan of Care (Signed)
  Problem: Education: Goal: Understanding of CV disease, CV risk reduction, and recovery process will improve Outcome: Progressing Goal: Individualized Educational Video(s) Outcome: Progressing   Problem: Activity: Goal: Ability to return to baseline activity level will improve Outcome: Progressing   Problem: Cardiovascular: Goal: Ability to achieve and maintain adequate cardiovascular perfusion will improve Outcome: Progressing Goal: Vascular access site(s) Level 0-1 will be maintained Outcome: Progressing   Problem: Health Behavior/Discharge Planning: Goal: Ability to safely manage health-related needs after discharge will improve Outcome: Progressing   Problem: Education: Goal: Knowledge of General Education information will improve Description: Including pain rating scale, medication(s)/side effects and non-pharmacologic comfort measures Outcome: Progressing   Problem: Health Behavior/Discharge Planning: Goal: Ability to manage health-related needs will improve Outcome: Progressing   Problem: Clinical Measurements: Goal: Ability to maintain clinical measurements within normal limits will improve Outcome: Progressing Goal: Will remain free from infection Outcome: Progressing Goal: Diagnostic test results will improve Outcome: Progressing Goal: Respiratory complications will improve Outcome: Progressing Goal: Cardiovascular complication will be avoided Outcome: Progressing   Problem: Activity: Goal: Risk for activity intolerance will decrease Outcome: Progressing   Problem: Nutrition: Goal: Adequate nutrition will be maintained Outcome: Progressing   Problem: Coping: Goal: Level of anxiety will decrease Outcome: Progressing   Problem: Elimination: Goal: Will not experience complications related to bowel motility Outcome: Progressing Goal: Will not experience complications related to urinary retention Outcome: Progressing   Problem: Pain Managment: Goal:  General experience of comfort will improve Outcome: Progressing   Problem: Safety: Goal: Ability to remain free from injury will improve Outcome: Progressing   Problem: Skin Integrity: Goal: Risk for impaired skin integrity will decrease Outcome: Progressing   Problem: Education: Goal: Knowledge of prescribed regimen will improve Outcome: Progressing   Problem: Activity: Goal: Ability to tolerate increased activity will improve Outcome: Progressing   Problem: Bowel/Gastric: Goal: Gastrointestinal status for postoperative course will improve Outcome: Progressing   Problem: Clinical Measurements: Goal: Postoperative complications will be avoided or minimized Outcome: Progressing Goal: Signs and symptoms of graft occlusion will improve Outcome: Progressing   Problem: Skin Integrity: Goal: Demonstration of wound healing without infection will improve Outcome: Progressing   

## 2023-07-22 NOTE — Progress Notes (Signed)
Physical Therapy Treatment Patient Details Name: Melanie Browning MRN: [id number] DOB: 1941/07/04 Today's Date: 07/22/2023   History of Present Illness Patient is an 82 year old female s/p left femoral artery to below-the-knee popliteal artery bypass with in-situ saphenous vein graft spliced with a large proximal leg saphenous vein branch. WBAT.    PT Comments  Pt A&Ox4, reported minimal pain at rest but with any mobility/ gravity dependent positioning of LLE pt experiencing 10/10 pain. PT/pt spent time discussing positioning, strategies to minimize pain during mobility, performing a few supine exercises (AAROM for LLE). Supine to sit with supervision and use of rails, extended time. Sit <> Stand with RW and CGA with pt primarily NWB on LLE due to pain. Unable to tolerate ambulation at this time due to pain, returned to supine with minA for LLE. Overall the patient has demonstrated a decline in function this admission as well as compared to PLOF and would benefit from further skilled PT intervention, care team updated of pt status.     If plan is discharge home, recommend the following: A little help with walking and/or transfers;A little help with bathing/dressing/bathroom   Can travel by private vehicle     No  Equipment Recommendations  Other (comment) (TBD)    Recommendations for Other Services       Precautions / Restrictions Precautions Precautions: Fall Restrictions Weight Bearing Restrictions: Yes LLE Weight Bearing: Weight bearing as tolerated Other Position/Activity Restrictions: Pt self-selects NWB or TWB     Mobility  Bed Mobility Overal bed mobility: Needs Assistance Bed Mobility: Supine to Sit, Sit to Supine     Supine to sit: Supervision Sit to supine: Min assist   General bed mobility comments: minA for LLE assist due to pain    Transfers Overall transfer level: Needs assistance Equipment used: Rolling walker (2 wheels) Transfers: Sit to/from Stand Sit to  Stand: Contact guard assist           General transfer comment: cues for hand placement    Ambulation/Gait               General Gait Details: unable to due to pain   Stairs             Wheelchair Mobility     Tilt Bed    Modified Rankin (Stroke Patients Only)       Balance Overall balance assessment: Needs assistance Sitting-balance support: Feet supported Sitting balance-Leahy Scale: Good     Standing balance support: Bilateral upper extremity supported Standing balance-Leahy Scale: Poor                              Cognition Arousal: Alert Behavior During Therapy: WFL for tasks assessed/performed Overall Cognitive Status: Within Functional Limits for tasks assessed                                          Exercises Other Exercises Other Exercises: ankle pumps, heel slides (AAROM), hip abduction/adduction (AAROM) on LLE    General Comments        Pertinent Vitals/Pain Pain Assessment Pain Assessment: 0-10 Pain Score: 10-Worst pain ever Pain Location: L groin at wound vac site, inner L thigh at honeycomb site Pain Descriptors / Indicators: Discomfort, Sore, Tightness, Burning, Heaviness Pain Intervention(s): Limited activity within patient's tolerance, Monitored during session, Repositioned, Premedicated before session  Home Living                          Prior Function            PT Goals (current goals can now be found in the care plan section) Progress towards PT goals: Progressing toward goals (limited by pain)    Frequency    Min 1X/week      PT Plan      Co-evaluation              AM-PAC PT "6 Clicks" Mobility   Outcome Measure  Help needed turning from your back to your side while in a flat bed without using bedrails?: A Little Help needed moving from lying on your back to sitting on the side of a flat bed without using bedrails?: A Little Help needed moving to and  from a bed to a chair (including a wheelchair)?: A Little Help needed standing up from a chair using your arms (e.g., wheelchair or bedside chair)?: A Little Help needed to walk in hospital room?: A Lot Help needed climbing 3-5 steps with a railing? : A Lot 6 Click Score: 16    End of Session   Activity Tolerance: Patient limited by pain Patient left: in bed;with call bell/phone within reach Nurse Communication: Mobility status PT Visit Diagnosis: Other abnormalities of gait and mobility (R26.89);Muscle weakness (generalized) (M62.81);Difficulty in walking, not elsewhere classified (R26.2);Pain Pain - Right/Left: Left Pain - part of body: Leg     Time: 4098-1191 PT Time Calculation (min) (ACUTE ONLY): 29 min  Charges:    $Therapeutic Activity: 23-37 mins PT General Charges $$ ACUTE PT VISIT: 1 Visit           Olga Coaster PT, DPT 3:46 PM,07/22/23

## 2023-07-23 LAB — CBC
HCT: 24.6 % — ABNORMAL LOW (ref 36.0–46.0)
Hemoglobin: 8 g/dL — ABNORMAL LOW (ref 12.0–15.0)
MCH: 33.2 pg (ref 26.0–34.0)
MCHC: 32.5 g/dL (ref 30.0–36.0)
MCV: 102.1 fL — ABNORMAL HIGH (ref 80.0–100.0)
Platelets: 248 10*3/uL (ref 150–400)
RBC: 2.41 MIL/uL — ABNORMAL LOW (ref 3.87–5.11)
RDW: 13.7 % (ref 11.5–15.5)
WBC: 6.5 10*3/uL (ref 4.0–10.5)
nRBC: 0 % (ref 0.0–0.2)

## 2023-07-23 LAB — BASIC METABOLIC PANEL
Anion gap: 9 (ref 5–15)
BUN: 13 mg/dL (ref 8–23)
CO2: 24 mmol/L (ref 22–32)
Calcium: 8.4 mg/dL — ABNORMAL LOW (ref 8.9–10.3)
Chloride: 102 mmol/L (ref 98–111)
Creatinine, Ser: 0.7 mg/dL (ref 0.44–1.00)
GFR, Estimated: >60 mL/min (ref >60)
Glucose, Bld: 90 mg/dL (ref 70–99)
Potassium: 4.1 mmol/L (ref 3.5–5.1)
Sodium: 135 mmol/L (ref 135–145)

## 2023-07-23 NOTE — Plan of Care (Signed)
  Problem: Education: Goal: Knowledge of General Education information will improve Description: Including pain rating scale, medication(s)/side effects and non-pharmacologic comfort measures Outcome: Progressing   Problem: Clinical Measurements: Goal: Will remain free from infection Outcome: Progressing Goal: Diagnostic test results will improve Outcome: Progressing   Problem: Activity: Goal: Risk for activity intolerance will decrease Outcome: Progressing   Problem: Nutrition: Goal: Adequate nutrition will be maintained Outcome: Progressing

## 2023-07-23 NOTE — Progress Notes (Signed)
[facility]  Daily Progress Note   Subjective  - 7 Days Post-Op  Patient is resting in bed when I enter.  She notes that today has also been a very difficult day.  She is having significant pain when she stands from the left lower extremity.  She describes really only being able to toe-touch.  Objective Vitals:   07/22/23 0751 07/22/23 1450 07/22/23 2331 07/23/23 0726  BP: (!) 138/54 (!) 103/58 133/70 125/64  Pulse: 86 76 77 72  Resp: 14 14 17 16   Temp: 97.9 F (36.6 C) 97.9 F (36.6 C) 98 F (36.7 C) 98.1 F (36.7 C)  TempSrc:      SpO2: 100% 98% 99% 98%  Weight:      Height:       No intake or output data in the 24 hours ending 07/23/23 1023  PULM  Normal effort , no use of accessory muscles CV  No JVD, RRR Abd      No distended, nontender VASC  dressings are all removed and the wounds painted with ChloraPrep and then honeycomb dressings reapplied.  All incisions are clean dry and intact.  The leg remains significantly edematous and somewhat tense.  Today a DP pulse is 2+ and the posterior tibial pulses 1+.  Laboratory CBC    Component Value Date/Time   WBC 6.5 07/23/2023 0507   HGB 8.0 (L) 07/23/2023 0507   HGB 13.2 06/17/2023 0931   HCT 24.6 (L) 07/23/2023 0507   HCT 39.8 06/17/2023 0931   PLT 248 07/23/2023 0507   PLT 181 06/17/2023 0931    BMET    Component Value Date/Time   NA 135 07/23/2023 0507   K 4.1 07/23/2023 0507   CL 102 07/23/2023 0507   CO2 24 07/23/2023 0507   GLUCOSE 90 07/23/2023 0507   BUN 13 07/23/2023 0507   CREATININE 0.70 07/23/2023 0507   CALCIUM 8.4 (L) 07/23/2023 0507   GFRNONAA >60 07/23/2023 0507    Assessment/Planning: POD # 6 s/p left femoral to posterior tibial composite vein bypass  I have stopped her Lovenox and will reassess tomorrow.  I would prefer not to have to stop her Brilinta as well.  I have changed the dressings as noted above the incisions look good.  We will elevate the foot of the  bed to assist with reducing her edema as well as place a thigh high TED hose.  Patient will continue physical therapy as tolerated.  Anticipate discharge potentially Friday   Levora Dredge  07/23/2023, 10:23 AM

## 2023-07-23 NOTE — Progress Notes (Signed)
Physical Therapy Treatment Patient Details Name: Melanie Browning MRN: [id number] DOB: Aug 16, 1941 Today's Date: 07/23/2023   History of Present Illness Patient is an 82 year old female s/p left femoral artery to below-the-knee popliteal artery bypass with in-situ saphenous vein graft spliced with a large proximal leg saphenous vein branch. WBAT.    PT Comments  Pt alert, agreeable to PT motivated to mobilize. Reported 9/10 LLE with any gravity dependent positioning. She was able to perform bed mobility with supervision, sit <> Stand with RW and CGA and ambulated ~73ft in room. 1 LOB, minA to correct. Pt primarily TTWB but increased to PWB towards end of mobility. Returned to supine with needs in reach. The patient would benefit from further skilled PT intervention to continue to progress towards goals.    If plan is discharge home, recommend the following: A little help with walking and/or transfers;A little help with bathing/dressing/bathroom   Can travel by private vehicle     No  Equipment Recommendations  Other (comment) (TBD)    Recommendations for Other Services       Precautions / Restrictions Precautions Precautions: Fall Restrictions Weight Bearing Restrictions: Yes LLE Weight Bearing: Weight bearing as tolerated Other Position/Activity Restrictions: Pt self-selects NWB or TWB     Mobility  Bed Mobility Overal bed mobility: Needs Assistance Bed Mobility: Supine to Sit, Sit to Supine     Supine to sit: Supervision Sit to supine: Supervision        Transfers Overall transfer level: Needs assistance Equipment used: Rolling walker (2 wheels) Transfers: Sit to/from Stand Sit to Stand: Contact guard assist                Ambulation/Gait Ambulation/Gait assistance: Contact guard assist, Min assist Gait Distance (Feet): 10 Feet Assistive device: Rolling walker (2 wheels)         General Gait Details: minA for one LOB due to hand slipping off RW. primarily  CGA for remainder. TTWB-PWB during mobility   Stairs             Wheelchair Mobility     Tilt Bed    Modified Rankin (Stroke Patients Only)       Balance Overall balance assessment: Needs assistance Sitting-balance support: Feet supported Sitting balance-Leahy Scale: Good     Standing balance support: Bilateral upper extremity supported Standing balance-Leahy Scale: Poor                              Cognition Arousal: Alert Behavior During Therapy: WFL for tasks assessed/performed Overall Cognitive Status: Within Functional Limits for tasks assessed                                          Exercises      General Comments        Pertinent Vitals/Pain Pain Assessment Pain Assessment: 0-10 Pain Score: 9  Pain Location: L groin, inner L thigh at honeycomb site Pain Descriptors / Indicators: Discomfort, Sore, Tightness, Burning, Heaviness Pain Intervention(s): Limited activity within patient's tolerance, Monitored during session, Premedicated before session, Repositioned    Home Living                          Prior Function            PT Goals (current goals can  now be found in the care plan section) Progress towards PT goals: Progressing toward goals    Frequency    Min 1X/week      PT Plan      Co-evaluation              AM-PAC PT "6 Clicks" Mobility   Outcome Measure  Help needed turning from your back to your side while in a flat bed without using bedrails?: A Little Help needed moving from lying on your back to sitting on the side of a flat bed without using bedrails?: A Little Help needed moving to and from a bed to a chair (including a wheelchair)?: A Little Help needed standing up from a chair using your arms (e.g., wheelchair or bedside chair)?: A Little Help needed to walk in hospital room?: A Little Help needed climbing 3-5 steps with a railing? : A Lot 6 Click Score: 17    End  of Session Equipment Utilized During Treatment: Gait belt Activity Tolerance: Patient limited by pain Patient left: in bed;with call bell/phone within reach Nurse Communication: Mobility status PT Visit Diagnosis: Other abnormalities of gait and mobility (R26.89);Muscle weakness (generalized) (M62.81);Difficulty in walking, not elsewhere classified (R26.2);Pain Pain - Right/Left: Left Pain - part of body: Leg     Time: 0952-1010 PT Time Calculation (min) (ACUTE ONLY): 18 min  Charges:    $Therapeutic Activity: 8-22 mins PT General Charges $$ ACUTE PT VISIT: 1 Visit                     Olga Coaster PT, DPT 12:41 PM,07/23/23

## 2023-07-23 NOTE — Progress Notes (Signed)
Occupational Therapy Treatment Patient Details Name: Melanie Browning MRN: [id number] DOB: [birth year] Today's Date: 07/23/2023   History of present illness Patient is an 82 year old female s/p left femoral artery to below-the-knee popliteal artery bypass with in-situ saphenous vein graft spliced with a large proximal leg saphenous vein branch. WBAT.   OT comments  Pt seen for OT treatment on this date. Upon arrival to room pt supine in bed, agreeable to tx. Pt reported LLE pain to be 2/10 while supine and when up and moving a 7/10 pain. Pt completed supine<>sit t/f with supervision. Pt completed sit<>stand t/f with CGA +RW. Pt required cueing for hand placement. Noted heavy reliance on RW for UE support with weight primarily on RLE. Pt ambulated 67ft from room to hallway and back into room. Pt encouraged and cued to put WBAT on LLE. Pt returned to bed, left supine and call bell within reach and bed alarm.  Pt making good progress toward goals, will continue to follow POC. Discharge recommendation remains appropriate.        If plan is discharge home, recommend the following:  A little help with walking and/or transfers;A little help with bathing/dressing/bathroom;Help with stairs or ramp for entrance;Assist for transportation   Equipment Recommendations  BSC/3in1    Recommendations for Other Services      Precautions / Restrictions Precautions Precautions: Fall Restrictions Weight Bearing Restrictions: Yes LLE Weight Bearing: Weight bearing as tolerated Other Position/Activity Restrictions: Pt self-selects NWB or TWB       Mobility Bed Mobility Overal bed mobility: Needs Assistance Bed Mobility: Supine to Sit, Sit to Supine     Supine to sit: Supervision Sit to supine: Supervision        Transfers Overall transfer level: Needs assistance Equipment used: Rolling walker (2 wheels) Transfers: Sit to/from Stand Sit to Stand: Contact guard assist           General transfer  comment: cues for hand placement     Balance Overall balance assessment: Needs assistance Sitting-balance support: Feet supported Sitting balance-Leahy Scale: Good     Standing balance support: Bilateral upper extremity supported Standing balance-Leahy Scale: Poor                             ADL either performed or assessed with clinical judgement   ADL Overall ADL's : Needs assistance/impaired                                     Functional mobility during ADLs: Rolling walker (2 wheels);Contact guard assist      Extremity/Trunk Assessment Upper Extremity Assessment Upper Extremity Assessment: Generalized weakness   Lower Extremity Assessment Lower Extremity Assessment: Generalized weakness LLE: Unable to fully assess due to pain LLE Coordination: decreased gross motor   Cervical / Trunk Assessment Cervical / Trunk Assessment: Normal    Vision Patient Visual Report: No change from baseline     Perception     Praxis      Cognition Arousal: Alert Behavior During Therapy: WFL for tasks assessed/performed Overall Cognitive Status: Within Functional Limits for tasks assessed                                          Exercises      Shoulder  Instructions       General Comments      Pertinent Vitals/ Pain       Pain Assessment Pain Assessment: 0-10 Pain Score: 7  Pain Location: L groin, inner L thigh at honeycomb site. While supine in bed she reports a 2/10 pain. Pain Descriptors / Indicators: Discomfort, Sore, Tightness, Burning, Heaviness Pain Intervention(s): Monitored during session, Limited activity within patient's tolerance  Home Living                                          Prior Functioning/Environment              Frequency  Min 1X/week        Progress Toward Goals  OT Goals(current goals can now be found in the care plan section)  Progress towards OT goals: Progressing  toward goals  Acute Rehab OT Goals Patient Stated Goal: to go home. OT Goal Formulation: With patient Time For Goal Achievement: 07/31/23 Potential to Achieve Goals: Good  Plan      Co-evaluation                 AM-PAC OT "6 Clicks" Daily Activity     Outcome Measure   Help from another person eating meals?: None Help from another person taking care of personal grooming?: A Little Help from another person toileting, which includes using toliet, bedpan, or urinal?: A Little Help from another person bathing (including washing, rinsing, drying)?: A Lot Help from another person to put on and taking off regular upper body clothing?: None Help from another person to put on and taking off regular lower body clothing?: A Lot 6 Click Score: 18    End of Session Equipment Utilized During Treatment: Rolling walker (2 wheels)  OT Visit Diagnosis: Other abnormalities of gait and mobility (R26.89);Muscle weakness (generalized) (M62.81)   Activity Tolerance Patient limited by pain   Patient Left in bed;with call bell/phone within reach;with bed alarm set   Nurse Communication          Time: 2956-2130 OT Time Calculation (min): 20 min  Charges:    Butch Penny, SOT

## 2023-07-23 NOTE — Progress Notes (Signed)
[facility]  Daily Progress Note   Subjective  - 7 Days Post-Op  Patient comfortable in bed when I entered the room.  She notes that she is still having significant pain in her leg but overall today feels a bit better.    Objective Vitals:   07/22/23 1450 07/22/23 2331 07/23/23 0726 07/23/23 1526  BP: (!) 103/58 133/70 125/64 (!) 131/90  Pulse: 76 77 72 78  Resp: 14 17 16 16   Temp: 97.9 F (36.6 C) 98 F (36.7 C) 98.1 F (36.7 C) 98.1 F (36.7 C)  TempSrc:      SpO2: 98% 99% 98% 99%  Weight:      Height:       No intake or output data in the 24 hours ending 07/23/23 1900  PULM  Normal effort , no use of accessory muscles CV  No JVD, RRR Abd      No distended, nontender VASC  Much less edema at the ankle incisions dressed minimal blood staining  Laboratory CBC    Component Value Date/Time   WBC 6.5 07/23/2023 0507   HGB 8.0 (L) 07/23/2023 0507   HGB 13.2 06/17/2023 0931   HCT 24.6 (L) 07/23/2023 0507   HCT 39.8 06/17/2023 0931   PLT 248 07/23/2023 0507   PLT 181 06/17/2023 0931    BMET    Component Value Date/Time   NA 135 07/23/2023 0507   K 4.1 07/23/2023 0507   CL 102 07/23/2023 0507   CO2 24 07/23/2023 0507   GLUCOSE 90 07/23/2023 0507   BUN 13 07/23/2023 0507   CREATININE 0.70 07/23/2023 0507   CALCIUM 8.4 (L) 07/23/2023 0507   GFRNONAA >60 07/23/2023 0507    Assessment/Planning: POD # 7 s/p left femoral to posterior tibial bypass with in situ composite vein  Laboratory studies reviewed with the patient.  Hemoglobin is stable white count remains normal and her electrolytes are in good order.  I believe she is improving and will look forward to discharge Friday.   Earl Lites Annalicia Renfrew  07/23/2023, 7:00 PM

## 2023-07-23 NOTE — Care Management Important Message (Signed)
Important Message  Patient Details  Name: Melanie Browning MRN: [id number] Date of Birth: 1941/07/14   Important Message Given:  Yes - Medicare IM     Hetty Ely, RN 07/23/2023, 3:07 PM

## 2023-07-24 NOTE — Progress Notes (Signed)
[facility]  Daily Progress Note   Subjective  - 8 Days Post-Op  Still difficult to ambulate but she did get up this afternoon and walk to the nursing station.  She feels her leg is better.  She feels she is ready to be discharged.  Objective Vitals:   07/24/23 0034 07/24/23 0902 07/24/23 1500 07/24/23 1508  BP: 126/68 119/88 (!) 155/65 (!) 155/65  Pulse: 78 75 80 80  Resp: 18 18 18 18   Temp: 99 F (37.2 C) 98.4 F (36.9 C)    TempSrc:      SpO2: 98% 99% 100% 100%  Weight:      Height:       No intake or output data in the 24 hours ending 07/24/23 2102  PULM  Normal effort , no use of accessory muscles CV  No JVD, RRR Abd      No distended, nontender VASC  the leg is much less edematous really just trace edema at the ankle.  The dressings over the incisions are intact with no change in the blood staining.  The incisions appear quite clean through the honeycomb.  Laboratory CBC    Component Value Date/Time   WBC 6.5 07/23/2023 0507   HGB 8.0 (L) 07/23/2023 0507   HGB 13.2 06/17/2023 0931   HCT 24.6 (L) 07/23/2023 0507   HCT 39.8 06/17/2023 0931   PLT 248 07/23/2023 0507   PLT 181 06/17/2023 0931    BMET    Component Value Date/Time   NA 135 07/23/2023 0507   K 4.1 07/23/2023 0507   CL 102 07/23/2023 0507   CO2 24 07/23/2023 0507   GLUCOSE 90 07/23/2023 0507   BUN 13 07/23/2023 0507   CREATININE 0.70 07/23/2023 0507   CALCIUM 8.4 (L) 07/23/2023 0507   GFRNONAA >60 07/23/2023 0507    Assessment/Planning: POD # 8 s/p left Pham posterior tibial bypass with in situ composite autologous vein conduit  Patient seems much better today and clearly is good for discharge tomorrow.  I will plan to change the dressings 1 more time and will plan for discharge tomorrow.   Levora Dredge  07/24/2023, 9:02 PM

## 2023-07-24 NOTE — Plan of Care (Signed)
  Problem: Activity: Goal: Ability to return to baseline activity level will improve Outcome: Progressing   Problem: Cardiovascular: Goal: Ability to achieve and maintain adequate cardiovascular perfusion will improve Outcome: Progressing   Problem: Education: Goal: Knowledge of General Education information will improve Description: Including pain rating scale, medication(s)/side effects and non-pharmacologic comfort measures Outcome: Progressing   Problem: Health Behavior/Discharge Planning: Goal: Ability to manage health-related needs will improve Outcome: Progressing   Problem: Clinical Measurements: Goal: Will remain free from infection Outcome: Progressing Goal: Respiratory complications will improve Outcome: Progressing Goal: Cardiovascular complication will be avoided Outcome: Progressing   Problem: Activity: Goal: Risk for activity intolerance will decrease Outcome: Progressing   Problem: Nutrition: Goal: Adequate nutrition will be maintained Outcome: Progressing   Problem: Coping: Goal: Level of anxiety will decrease Outcome: Progressing   Problem: Elimination: Goal: Will not experience complications related to urinary retention Outcome: Progressing   Problem: Pain Managment: Goal: General experience of comfort will improve Outcome: Progressing   Problem: Safety: Goal: Ability to remain free from injury will improve Outcome: Progressing

## 2023-07-25 MED ORDER — OXYCODONE-ACETAMINOPHEN 5-325 MG PO TABS: 1.0000 | ORAL_TABLET | ORAL | 0 refills | Status: DC | PRN

## 2023-07-25 NOTE — TOC Progression Note (Signed)
Transition of Care [facility]) - Progression Note    Patient Details  Name: Melanie Browning MRN: [id number] Date of Birth: 1941-06-07  Transition [facility]) CM/SW Contact  Margarito Liner, LCSW Phone Number: 07/25/2023, 1:00 PM  Clinical Narrative:   Patient plans to return to ILF side of [place] today. CSW left voicemail for ILF RN, Ma Rings. Vascular NP spoke to [facility] this morning. Patient said her neighbor will pick her up and she has plenty of DME at home.  Expected Discharge Plan and Services         Expected Discharge Date: 07/25/23                                     Social Determinants of Health (SDOH) Interventions SDOH Screenings   Food Insecurity: No Food Insecurity (07/18/2023)  Housing: Low Risk  (07/18/2023)  Transportation Needs: No Transportation Needs (07/18/2023)  Utilities: Not At Risk (07/18/2023)  Depression (PHQ2-9): Low Risk  (01/03/2023)  Financial Resource Strain: Low Risk  (12/13/2022)   Received from [facility], [facility]  Tobacco Use: Low Risk  (07/16/2023)    Readmission Risk Interventions     No data to display

## 2023-07-25 NOTE — Plan of Care (Signed)
  Problem: Clinical Measurements: Goal: Ability to maintain clinical measurements within normal limits will improve Outcome: Progressing Goal: Will remain free from infection Outcome: Progressing Goal: Diagnostic test results will improve Outcome: Progressing   Problem: Elimination: Goal: Will not experience complications related to bowel motility Outcome: Progressing Goal: Will not experience complications related to urinary retention Outcome: Progressing   Problem: Pain Managment: Goal: General experience of comfort will improve Outcome: Progressing

## 2023-07-25 NOTE — Plan of Care (Signed)
  Problem: Education: Goal: Understanding of CV disease, CV risk reduction, and recovery process will improve Outcome: Progressing Goal: Individualized Educational Video(s) Outcome: Progressing   Problem: Education: Goal: Understanding of CV disease, CV risk reduction, and recovery process will improve Outcome: Progressing Goal: Individualized Educational Video(s) Outcome: Progressing   Problem: Activity: Goal: Ability to return to baseline activity level will improve Outcome: Progressing   Problem: Cardiovascular: Goal: Ability to achieve and maintain adequate cardiovascular perfusion will improve Outcome: Progressing Goal: Vascular access site(s) Level 0-1 will be maintained Outcome: Progressing   Problem: Education: Goal: Knowledge of General Education information will improve Description: Including pain rating scale, medication(s)/side effects and non-pharmacologic comfort measures Outcome: Progressing   Problem: Health Behavior/Discharge Planning: Goal: Ability to manage health-related needs will improve Outcome: Progressing   Problem: Clinical Measurements: Goal: Diagnostic test results will improve Outcome: Progressing

## 2023-07-25 NOTE — Discharge Summary (Cosign Needed Addendum)
[facility]    Discharge Summary    Patient ID:  Melanie Browning MRN: [id number] DOB/AGE: Feb 06, 1941 82 y.o.  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)    a.) multiple vascular interventions (PTA, stenting, arthrectomy, and thrombectomy) of LEFT SFA, LEFT popliteal, LEFT posterior tibial, and LEFT anterior tibial arteries due to critical limb ischemia   CAD (coronary artery disease) 11/09/2020   a.) cCTA 11/09/2020: Ca2+ 539 (83rd %'ile for age/sex/race match control); <25% dRCA & pLCx, >70% pLAD; b.) LHC/PCI 11/15/2020: 90% pLAD (2.75 x 18 mm Resolute Onyx DES); c.) MV 06/26/2023: no sig ischemia   Cervical spondylosis    Colitis    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)    a.) s/p multiple vascular interventions   Diastolic dysfunction 06/26/2023   a.) TTE 06/26/2023: EF 60-65%, no RWMAs, G2DD, sev AoV thickening with mild AR, mild-mod AS (MPG 18.2 mmHg; AVA 0.73 cm2)   [place]    [place]   Dyslipidemia    History of cervical cancer    a.) s/p TAH   HPV (human papilloma virus) infection    Hyperlipidemia    Hypothyroidism    IGT (impaired glucose tolerance)    Left upper lobe pulmonary nodule    Long term current use of aspirin    Long term current use of ticagrelor therapy    Macrocytic anemia    Osteoporosis    a.) Tx'd with  zolodronic acid (Reclast) infusions   PAD (peripheral artery disease) (HCC)    Polymyalgia rheumatica (HCC)    Skin cancer of nose    Transaminitis    Vaginal dysplasia     Procedure(s): BYPASS GRAFT FEMORAL-POPLITEAL ARTERY (FEMORAL-POSTERIOR TIBIAL BYPASS W/ SAPHENOUS VEIN- [id number]) APPLICATION OF CELL SAVER  Discharged Condition: good  HPI:  Melanie Browning is a 82 year old female who underwent left femoral to posterior tibial artery bypass using native saphenous vein on 07/16/2023.  The patient's postoperative course was complicated with some swelling in her bypass area in addition to pain and discomfort.  However she notes that the pain is better controlled with oral pain medications now as she is able to ambulate successfully unassisted with walker.  Today all of her wounds are intact with some swelling noted to the left lower extremity with bruising.She will need outpatient PT and OT at [facility] Course:  Melanie Browning is a 82 y.o. female is S/P Left Fem-posterior tibial bypass with saphenous vein Extubated: POD # 0 Physical Exam:  Alert notes x3, no acute distress Face: Symmetrical.  Tongue is midline. Neck: Trachea is midline.  No swelling or bruising. Cardiovascular: Regular rate and rhythm Pulmonary: Clear to auscultation bilaterally Abdomen: Soft, nontender, nondistended Right groin access: Clean dry and intact.  No swelling or drainage noted Left groin access: Clean dry and intact.  No swelling or drainage noted Left lower extremity: Thigh soft.  Calf soft.  Extremities  warm distally toes.  Hard to palpate pedal pulses foot is warm to the touch with good capillary refill and palpable DP pulse.  Bruising and swelling noted distally. Right lower extremity: Thigh soft.  Calf soft.  Extremities warm distally toes.  Hard to palpate pedal pulses however the foot is warm is her good capillary refill. Neurological: No deficits noted   Post-op wounds:  clean, dry,  intact or healing well  Pt. Ambulating, voiding and taking PO diet without difficulty. Pt pain controlled with PO pain meds.  Labs:  As below  Complications: none  Consults:    Significant Diagnostic Studies: CBC Lab Results  Component Value Date   WBC 6.5 07/23/2023   HGB 8.0 (L) 07/23/2023   HCT 24.6 (L) 07/23/2023   MCV 102.1 (H) 07/23/2023   PLT 248 07/23/2023    BMET    Component Value Date/Time   NA 135 07/23/2023 0507   K 4.1 07/23/2023 0507   CL 102 07/23/2023 0507   CO2 24 07/23/2023 0507   GLUCOSE 90 07/23/2023 0507   BUN 13 07/23/2023 0507   CREATININE 0.70 07/23/2023 0507   CALCIUM 8.4 (L) 07/23/2023 0507   GFRNONAA >60 07/23/2023 0507   COAG No results found for: "INR", "PROTIME"   Disposition:  Discharge to :Home  Allergies as of 07/25/2023       Reactions   Lactose Other (See Comments), Nausea And Vomiting   Gi upset and diarrhea Other reaction(s): Other (See Comments) Gi upset and diarrhea  Other reaction(s): Other (See Comments) Gi upset and diarrhea Gi upset and diarrhea Gi upset and diarrhea    Gi upset and diarrhea    Gi upset and diarrhea Gi upset and diarrhea Gi upset and diarrhea Gi upset and diarrhea    Gi upset and diarrhea  Other reaction(s): Other (See Comments) Gi upset and diarrhea Gi upset and diarrhea Gi upset and diarrhea  Gi upset and diarrhea  Gi upset and diarrhea Gi upset and diarrhea Gi upset and diarrhea        Medication List     TAKE these medications    ALPRAZolam 0.25 MG tablet Commonly known as: XANAX Take 0.25 mg by mouth 2 (two) times daily as needed for anxiety.   aspirin EC 81 MG tablet Take 81 mg by mouth every evening. Swallow whole.   Brilinta 90 MG Tabs tablet Generic drug: ticagrelor TAKE ONE TABLET BY MOUTH TWICE DAILY   budesonide 3 MG 24 hr capsule Commonly known as: ENTOCORT EC Take 9 mg by mouth daily.   diphenoxylate-atropine 2.5-0.025 MG tablet Commonly known as:  LOMOTIL TAKE 1 TABLET BY MOUTH 3 TIMES DAILY AS NEEDED FOR DIARRHEA.   estradiol 0.1 MG/GM vaginal cream Commonly known as: ESTRACE Insert 1/4 applicator full into vagina at night twice a week   levothyroxine 75 MCG tablet Commonly known as: SYNTHROID Take 75 mcg by mouth daily before breakfast.   losartan 25 MG tablet Commonly known as: COZAAR Take 25 mg by mouth daily.   nitroGLYCERIN 0.4 MG SL tablet Commonly known as: NITROSTAT Place under the tongue.   oxyCODONE-acetaminophen 5-325 MG tablet Commonly known as: PERCOCET/ROXICET Take 1-2 tablets by mouth every 4 (four) hours as needed for moderate pain (pain score 4-6) or severe pain (pain score 7-10). Take 1-2 tablets by mouth every 4 (four) hours as needed for pain.  Take 1 tab for moderate pain (pain score 4-6) or 2 tab for severe pain (pain score 7-10)   rosuvastatin  5 MG tablet Commonly known as: CRESTOR Take 5 mg by mouth daily.   Vitamin B-Complex Tabs Take 1 tablet by mouth daily.       Verbal and written Discharge instructions given to the patient. Wound care per Discharge AVS  Contact information for after-discharge care     Destination     [facility] .   Service: Skilled Nursing Contact information: [address] [place] [address] [phone]                     Signed: Georgiana Spinner, NP  07/25/2023, 11:32 AM

## 2023-07-28 ENCOUNTER — Other Ambulatory Visit: Payer: Medicare Other

## 2023-07-30 ENCOUNTER — Other Ambulatory Visit (INDEPENDENT_AMBULATORY_CARE_PROVIDER_SITE_OTHER): Payer: Self-pay | Admitting: Nurse Practitioner

## 2023-07-30 ENCOUNTER — Telehealth (INDEPENDENT_AMBULATORY_CARE_PROVIDER_SITE_OTHER): Payer: Self-pay

## 2023-07-30 NOTE — Telephone Encounter (Signed)
I have called and discussed with patient.  Likely a seroma will contact if anything changes

## 2023-07-30 NOTE — Telephone Encounter (Signed)
Patient called in stating that she has swelling and hardness at the bottom of the top of the incision where the wound vac was removed. Patient stated it is more swollen with a hardness that is raised in her groin going down. No drainage, redness or fever. The nurse stated it looks like a hematoma. Patient would like a call from you.

## 2023-08-01 ENCOUNTER — Encounter (INDEPENDENT_AMBULATORY_CARE_PROVIDER_SITE_OTHER): Payer: Self-pay | Admitting: Vascular Surgery

## 2023-08-04 ENCOUNTER — Ambulatory Visit: Payer: Medicare Other | Admitting: Oncology

## 2023-08-05 ENCOUNTER — Other Ambulatory Visit (INDEPENDENT_AMBULATORY_CARE_PROVIDER_SITE_OTHER): Payer: Self-pay | Admitting: Nurse Practitioner

## 2023-08-05 NOTE — Telephone Encounter (Signed)
Patient called stating that she needs a refill of oxycodone. Her last Rx lasted 3 days, "she's out but has a couple left". She needs more than what's being given and would like to pick it up by noon today.

## 2023-08-06 NOTE — Progress Notes (Signed)
Patient ID: Melanie Browning, female   DOB: 12/05/1940, 82 y.o.   MRN: [id number]  No chief complaint on file.   HPI Melanie Browning is a 82 y.o. female.    Procedure 07/16/2023: left common femoral artery to posterior tibial artery bypass with composite in situ saphenous vein with a spliced segment of reversed saphenous vein   She is still having a tough time and is dealing with quite a bit of pain.  However the postoperatively edema seems to be improving.  She is slowly improving with respect to her activities and therapy.  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)    a.) multiple vascular interventions (PTA, stenting, arthrectomy, and thrombectomy) of LEFT SFA, LEFT popliteal, LEFT posterior tibial, and LEFT anterior tibial arteries due to critical limb ischemia   CAD (coronary artery disease) 11/09/2020   a.) cCTA 11/09/2020: Ca2+ 539 (83rd %'ile for age/sex/race match control); <25% dRCA & pLCx, >70% pLAD; b.) LHC/PCI 11/15/2020: 90% pLAD (2.75 x 18 mm Resolute Onyx DES); c.) MV 06/26/2023: no sig ischemia   Cervical spondylosis    Colitis    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)    a.) s/p multiple vascular interventions   Diastolic dysfunction 06/26/2023   a.) TTE 06/26/2023: EF 60-65%, no RWMAs, G2DD, sev AoV thickening with mild AR, mild-mod AS (MPG 18.2 mmHg; AVA 0.73 cm2)   [place]    [place]   Dyslipidemia    History of cervical cancer    a.) s/p TAH   HPV (human papilloma virus) infection    Hyperlipidemia    Hypothyroidism    IGT (impaired glucose tolerance)    Left upper lobe pulmonary nodule    Long term current use of aspirin    Long term current use of ticagrelor  therapy    Macrocytic anemia    Osteoporosis    a.) Tx'd with zolodronic acid (Reclast) infusions   PAD (peripheral artery disease) (HCC)    Polymyalgia rheumatica (HCC)    Skin cancer of nose    Transaminitis    Vaginal dysplasia     Past Surgical History:  Procedure Laterality Date   COLONOSCOPY WITH PROPOFOL N/A 10/04/2020   Procedure: COLONOSCOPY WITH PROPOFOL;  Surgeon: Toledo, Boykin Nearing, MD;  Location: [facility];  Service: Gastroenterology;  Laterality: N/A;   COLPOSCOPY     CORONARY STENT INTERVENTION N/A 11/15/2020   Procedure: CORONARY STENT INTERVENTION;  Surgeon: Alwyn Pea, MD;  Location: [facility];  Service: Cardiovascular;  Laterality: N/A;   FEMORAL-POPLITEAL BYPASS GRAFT Left 07/16/2023   Procedure: BYPASS GRAFT FEMORAL-POPLITEAL ARTERY (FEMORAL-POSTERIOR TIBIAL BYPASS W/ SAPHENOUS VEIN- [id number]);  Surgeon: Renford Dills, MD;  Location: [facility];  Service: Vascular;  Laterality: Left;   KNEE ARTHROSCOPY Left    LAPAROSCOPIC HYSTERECTOMY     LEFT HEART CATH AND CORONARY ANGIOGRAPHY N/A 11/15/2020   Procedure: LEFT HEART CATH AND CORONARY ANGIOGRAPHY;  Surgeon: Alwyn Pea, MD;  Location: [facility];  Service: Cardiovascular;  Laterality: N/A;   LOWER EXTREMITY ANGIOGRAPHY Left 09/05/2020   Procedure: LOWER EXTREMITY ANGIOGRAPHY;  Surgeon: Renford Dills, MD;  Location: [facility];  Service:  Cardiovascular;  Laterality: Left;   LOWER EXTREMITY ANGIOGRAPHY Left 04/10/2021   Procedure: LOWER EXTREMITY ANGIOGRAPHY;  Surgeon: Renford Dills, MD;  Location: [facility];  Service: Cardiovascular;  Laterality: Left;   LOWER EXTREMITY ANGIOGRAPHY Left 11/20/2021   Procedure: Lower Extremity Angiography;  Surgeon: Renford Dills, MD;  Location: [facility];  Service: Cardiovascular;  Laterality: Left;   LOWER EXTREMITY ANGIOGRAPHY Left 06/05/2022   Procedure: Lower Extremity Angiography;  Surgeon:  Renford Dills, MD;  Location: [facility];  Service: Cardiovascular;  Laterality: Left;   LOWER EXTREMITY ANGIOGRAPHY Left 07/23/2022   Procedure: Lower Extremity Angiography;  Surgeon: Renford Dills, MD;  Location: [facility];  Service: Cardiovascular;  Laterality: Left;   LOWER EXTREMITY ANGIOGRAPHY Left 12/27/2022   Procedure: Lower Extremity Angiography;  Surgeon: Renford Dills, MD;  Location: [facility];  Service: Cardiovascular;  Laterality: Left;   LOWER EXTREMITY ANGIOGRAPHY Left 06/17/2023   Procedure: Lower Extremity Angiography;  Surgeon: Renford Dills, MD;  Location: [facility];  Service: Cardiovascular;  Laterality: Left;   TOTAL ABDOMINAL HYSTERECTOMY        Allergies  Allergen Reactions   Lactose Other (See Comments) and Nausea And Vomiting    Gi upset and diarrhea  Other reaction(s): Other (See Comments)  Gi upset and diarrhea  Other reaction(s): Other (See Comments) Gi upset and diarrhea Gi upset and diarrhea Gi upset and diarrhea    Gi upset and diarrhea    Gi upset and diarrhea Gi upset and diarrhea Gi upset and diarrhea  Gi upset and diarrhea    Gi upset and diarrhea  Other reaction(s): Other (See Comments) Gi upset and diarrhea Gi upset and diarrhea Gi upset and diarrhea  Gi upset and diarrhea  Gi upset and diarrhea Gi upset and diarrhea Gi upset and diarrhea    Current Outpatient Medications  Medication Sig Dispense Refill   ALPRAZolam (XANAX) 0.25 MG tablet Take 0.25 mg by mouth 2 (two) times daily as needed for anxiety.     aspirin EC 81 MG tablet Take 81 mg by mouth every evening. Swallow whole.     B Complex Vitamins (VITAMIN B-COMPLEX) TABS Take 1 tablet by mouth daily.     BRILINTA 90 MG TABS tablet TAKE ONE TABLET BY MOUTH TWICE DAILY 180 tablet 0   budesonide (ENTOCORT EC) 3 MG 24 hr capsule Take 9 mg by mouth daily.     diphenoxylate-atropine (LOMOTIL) 2.5-0.025 MG tablet TAKE 1 TABLET BY  MOUTH 3 TIMES DAILY AS NEEDED FOR DIARRHEA. 90 tablet 0   estradiol (ESTRACE) 0.1 MG/GM vaginal cream Insert 1/4 applicator full into vagina at night twice a week 42.5 g 2   levothyroxine (SYNTHROID) 75 MCG tablet Take 75 mcg by mouth daily before breakfast.      losartan (COZAAR) 25 MG tablet Take 25 mg by mouth daily.     nitroGLYCERIN (NITROSTAT) 0.4 MG SL tablet Place under the tongue. (Patient not taking: Reported on 07/16/2023)     oxyCODONE-acetaminophen (PERCOCET/ROXICET) 5-325 MG tablet Take 1-2 tablets by mouth every 6 (six) hours as needed for severe pain (pain score 7-10). 40 tablet 0   rosuvastatin (CRESTOR) 5 MG tablet Take 5 mg by mouth daily.     No current facility-administered medications for this visit.        Physical Exam There were no vitals taken for this visit. Gen:  WD/WN, NAD Skin: incision C/D/I all staples  removed.  Palpable pedal pulse     Assessment/Plan: 1. Atherosclerosis of native artery of both lower extremities with rest pain [facility]) Patient is doing relatively well.  She is certainly getting better.  We will follow-up in 2 to 3 weeks with noninvasive studies. - VAS [place] ABI WITH/WO TBI; Future - VAS [place] LOWER EXTREMITY ARTERIAL DUPLEX; Future      Levora Dredge 08/06/2023, 9:40 PM   This note was created with Dragon medical transcription system.  Any errors from dictation are unintentional.

## 2023-08-07 ENCOUNTER — Ambulatory Visit (INDEPENDENT_AMBULATORY_CARE_PROVIDER_SITE_OTHER): Payer: Medicare Other | Admitting: Vascular Surgery

## 2023-08-07 ENCOUNTER — Encounter (INDEPENDENT_AMBULATORY_CARE_PROVIDER_SITE_OTHER): Payer: Self-pay | Admitting: Vascular Surgery

## 2023-08-07 VITALS — BP 132/78 | HR 87 | Resp 18 | Ht 68.0 in | Wt 162.0 lb

## 2023-08-07 DIAGNOSIS — I70223 Atherosclerosis of native arteries of extremities with rest pain, bilateral legs: Secondary | ICD-10-CM

## 2023-08-08 ENCOUNTER — Other Ambulatory Visit (INDEPENDENT_AMBULATORY_CARE_PROVIDER_SITE_OTHER): Payer: Self-pay | Admitting: Vascular Surgery

## 2023-08-08 ENCOUNTER — Telehealth (INDEPENDENT_AMBULATORY_CARE_PROVIDER_SITE_OTHER): Payer: Self-pay

## 2023-08-08 DIAGNOSIS — I70222 Atherosclerosis of native arteries of extremities with rest pain, left leg: Secondary | ICD-10-CM

## 2023-08-08 MED ORDER — HYDROCODONE-ACETAMINOPHEN 10-300 MG PO TABS: 1.0000 | ORAL_TABLET | Freq: Four times a day (QID) | ORAL | 0 refills | Status: DC | PRN

## 2023-08-08 MED ORDER — HYDROCODONE-ACETAMINOPHEN 10-325 MG PO TABS: 1.0000 | ORAL_TABLET | Freq: Four times a day (QID) | ORAL | 0 refills | Status: DC | PRN

## 2023-08-08 NOTE — Progress Notes (Signed)
Patient has requested changing from Percocet to hydrocodone as she has done better with hydrocodone in the past.  She has been very appropriate and judicious with her pain medication.  I so I agree she can certainly have a refill of hydrocodone rather than Percocet.

## 2023-08-08 NOTE — Telephone Encounter (Signed)
[facility] called stating they don't have Hydrocodone- Acetaminophen 10-300 mg, However, they have 10-325 mg. They would need another Rx sent. I have added Hydrocodone- Acetaminophen 10-325 mg, 30 qty, every 6 hrs PRN with no refills in the pending medications. It need to be reviewed and signed off on.

## 2023-08-10 ENCOUNTER — Encounter (INDEPENDENT_AMBULATORY_CARE_PROVIDER_SITE_OTHER): Payer: Self-pay | Admitting: Vascular Surgery

## 2023-08-12 ENCOUNTER — Encounter (INDEPENDENT_AMBULATORY_CARE_PROVIDER_SITE_OTHER): Payer: Self-pay | Admitting: Vascular Surgery

## 2023-08-12 NOTE — Telephone Encounter (Signed)
Please contact the patient to get additional information such as contact information for the fitness center and what activities she wants to do in the fitness center.

## 2023-08-19 ENCOUNTER — Telehealth (INDEPENDENT_AMBULATORY_CARE_PROVIDER_SITE_OTHER): Payer: Self-pay

## 2023-08-19 NOTE — Telephone Encounter (Signed)
Patient is concerned about having swelling 4 weeks out from surgery. She stated the last 2 days she has had more swelling and pain than usual. She put the compression stockings on she received from he hospital, to see if it will help.  Patient would like input on swelling and pain  She has an ultrasound tomorrow.

## 2023-08-19 NOTE — Telephone Encounter (Signed)
No answer left voicemail to call back

## 2023-08-19 NOTE — Telephone Encounter (Signed)
This is not uncommon for it to be an "up and down situation" in regards to swelling and discomfort especially as she is becoming more active (such as going back to the activity center).  I would recommend that she be wearing her compression socks daily because swelling is a very common issue post surgery that can persist for months.

## 2023-08-20 ENCOUNTER — Ambulatory Visit (INDEPENDENT_AMBULATORY_CARE_PROVIDER_SITE_OTHER): Payer: Medicare Other

## 2023-08-20 DIAGNOSIS — I70223 Atherosclerosis of native arteries of extremities with rest pain, bilateral legs: Secondary | ICD-10-CM | POA: Diagnosis not present

## 2023-08-21 ENCOUNTER — Other Ambulatory Visit (INDEPENDENT_AMBULATORY_CARE_PROVIDER_SITE_OTHER): Payer: Self-pay | Admitting: Vascular Surgery

## 2023-08-21 DIAGNOSIS — I70222 Atherosclerosis of native arteries of extremities with rest pain, left leg: Secondary | ICD-10-CM

## 2023-08-21 LAB — VAS US ABI WITH/WO TBI: Right ABI: 1.07

## 2023-08-22 ENCOUNTER — Encounter (INDEPENDENT_AMBULATORY_CARE_PROVIDER_SITE_OTHER): Payer: Self-pay | Admitting: Nurse Practitioner

## 2023-08-22 NOTE — Telephone Encounter (Signed)
Spoke with patient. She stated she hasn't been to the fitness center because she needs a letter stating she can come back. She wants to just work the upper body for now and move to lower body later. She also wanted you to know she has been doing PT, but without equipment. Just walking and moving her hips.  She said she has an appt with Schnier Monday and she will discuss things with him then.

## 2023-08-25 ENCOUNTER — Ambulatory Visit (INDEPENDENT_AMBULATORY_CARE_PROVIDER_SITE_OTHER): Payer: Medicare Other | Admitting: Vascular Surgery

## 2023-08-25 ENCOUNTER — Encounter (INDEPENDENT_AMBULATORY_CARE_PROVIDER_SITE_OTHER): Payer: Self-pay | Admitting: Vascular Surgery

## 2023-08-25 VITALS — BP 139/74 | HR 76 | Resp 16 | Wt 147.0 lb

## 2023-08-25 DIAGNOSIS — S85002S Unspecified injury of popliteal artery, left leg, sequela: Secondary | ICD-10-CM

## 2023-08-25 DIAGNOSIS — I70212 Atherosclerosis of native arteries of extremities with intermittent claudication, left leg: Secondary | ICD-10-CM

## 2023-08-25 NOTE — Progress Notes (Unsigned)
Patient ID: Melanie Browning, female   DOB: [birth year], 82 y.o.   MRN: [id number]  No chief complaint on file.   HPI Melanie Browning is a 82 y.o. female.  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)    a.) multiple vascular interventions (PTA, stenting, arthrectomy, and thrombectomy) of LEFT SFA, LEFT popliteal, LEFT posterior tibial, and LEFT anterior tibial arteries due to critical limb ischemia   CAD (coronary artery disease) 11/09/2020   a.) cCTA 11/09/2020: Ca2+ 539 (83rd %'ile for age/sex/race match control); <25% dRCA & pLCx, >70% pLAD; b.) LHC/PCI 11/15/2020: 90% pLAD (2.75 x 18 mm Resolute Onyx DES); c.) MV 06/26/2023: no sig ischemia   Cervical spondylosis    Colitis    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)    a.) s/p multiple vascular interventions   Diastolic dysfunction 06/26/2023   a.) TTE 06/26/2023: EF 60-65%, no RWMAs, G2DD, sev AoV thickening with mild AR, mild-mod AS (MPG 18.2 mmHg; AVA 0.73 cm2)   Dry eye    [place]   Dyslipidemia    History of cervical cancer    a.) s/p TAH   HPV (human papilloma virus) infection    Hyperlipidemia    Hypothyroidism    IGT (impaired glucose tolerance)    Left upper lobe pulmonary nodule    Long term current use of aspirin    Long term current use of ticagrelor therapy    Macrocytic anemia    Osteoporosis    a.) Tx'd with zolodronic acid (Reclast) infusions   PAD (peripheral artery disease) (HCC)    Polymyalgia rheumatica (HCC)    Skin cancer of nose    Transaminitis    Vaginal dysplasia     Past Surgical History:  Procedure Laterality Date   COLONOSCOPY WITH PROPOFOL N/A 10/04/2020   Procedure: COLONOSCOPY WITH PROPOFOL;  Surgeon: Toledo,  Boykin Nearing, MD;  Location: [facility];  Service: Gastroenterology;  Laterality: N/A;   COLPOSCOPY     CORONARY STENT INTERVENTION N/A 11/15/2020   Procedure: CORONARY STENT INTERVENTION;  Surgeon: Alwyn Pea, MD;  Location: [facility];  Service: Cardiovascular;  Laterality: N/A;   FEMORAL-POPLITEAL BYPASS GRAFT Left 07/16/2023   Procedure: BYPASS GRAFT FEMORAL-POPLITEAL ARTERY (FEMORAL-POSTERIOR TIBIAL BYPASS W/ SAPHENOUS VEIN- [id number]);  Surgeon: Renford Dills, MD;  Location: [facility];  Service: Vascular;  Laterality: Left;   KNEE ARTHROSCOPY Left    LAPAROSCOPIC HYSTERECTOMY     LEFT HEART CATH AND CORONARY ANGIOGRAPHY N/A 11/15/2020   Procedure: LEFT HEART CATH AND CORONARY ANGIOGRAPHY;  Surgeon: Alwyn Pea, MD;  Location: [facility];  Service: Cardiovascular;  Laterality: N/A;   LOWER EXTREMITY ANGIOGRAPHY Left 09/05/2020   Procedure: LOWER EXTREMITY ANGIOGRAPHY;  Surgeon: Renford Dills, MD;  Location: [facility];  Service: Cardiovascular;  Laterality: Left;   LOWER EXTREMITY ANGIOGRAPHY Left 04/10/2021   Procedure: LOWER EXTREMITY ANGIOGRAPHY;  Surgeon: Renford Dills, MD;  Location: [facility];  Service: Cardiovascular;  Laterality: Left;   LOWER EXTREMITY ANGIOGRAPHY Left 11/20/2021   Procedure: Lower Extremity Angiography;  Surgeon: Renford Dills, MD;  Location: [facility];  Service: Cardiovascular;  Laterality: Left;   LOWER  EXTREMITY ANGIOGRAPHY Left 06/05/2022   Procedure: Lower Extremity Angiography;  Surgeon: Renford Dills, MD;  Location: [facility] INVASIVE CV LAB;  Service: Cardiovascular;  Laterality: Left;   LOWER EXTREMITY ANGIOGRAPHY Left 07/23/2022   Procedure: Lower Extremity Angiography;  Surgeon: Renford Dills, MD;  Location: [facility];  Service: Cardiovascular;  Laterality: Left;   LOWER EXTREMITY ANGIOGRAPHY Left 12/27/2022   Procedure: Lower Extremity Angiography;   Surgeon: Renford Dills, MD;  Location: [facility];  Service: Cardiovascular;  Laterality: Left;   LOWER EXTREMITY ANGIOGRAPHY Left 06/17/2023   Procedure: Lower Extremity Angiography;  Surgeon: Renford Dills, MD;  Location: [facility];  Service: Cardiovascular;  Laterality: Left;   TOTAL ABDOMINAL HYSTERECTOMY        Allergies  Allergen Reactions   Lactose Other (See Comments) and Nausea And Vomiting    Gi upset and diarrhea  Other reaction(s): Other (See Comments)  Gi upset and diarrhea  Other reaction(s): Other (See Comments) Gi upset and diarrhea Gi upset and diarrhea Gi upset and diarrhea    Gi upset and diarrhea    Gi upset and diarrhea Gi upset and diarrhea Gi upset and diarrhea  Gi upset and diarrhea    Gi upset and diarrhea  Other reaction(s): Other (See Comments) Gi upset and diarrhea Gi upset and diarrhea Gi upset and diarrhea  Gi upset and diarrhea  Gi upset and diarrhea Gi upset and diarrhea Gi upset and diarrhea    Current Outpatient Medications  Medication Sig Dispense Refill   HYDROcodone-acetaminophen (NORCO) 10-325 MG tablet Take 1 tablet by mouth every 6 (six) hours as needed for severe pain (pain score 7-10). 40 tablet 0   aspirin EC 81 MG tablet Take 81 mg by mouth every evening. Swallow whole.     B Complex Vitamins (VITAMIN B-COMPLEX) TABS Take 1 tablet by mouth daily.     BRILINTA 90 MG TABS tablet TAKE ONE TABLET BY MOUTH TWICE DAILY 180 tablet 0   budesonide (ENTOCORT EC) 3 MG 24 hr capsule Take 9 mg by mouth daily.     diphenoxylate-atropine (LOMOTIL) 2.5-0.025 MG tablet TAKE 1 TABLET BY MOUTH 3 TIMES DAILY AS NEEDED FOR DIARRHEA. 90 tablet 0   estradiol (ESTRACE) 0.1 MG/GM vaginal cream Insert 1/4 applicator full into vagina at night twice a week 42.5 g 2   levothyroxine (SYNTHROID) 75 MCG tablet Take 75 mcg by mouth daily before breakfast.      losartan (COZAAR) 25 MG tablet Take 25 mg by mouth daily.     nitroGLYCERIN  (NITROSTAT) 0.4 MG SL tablet Place under the tongue. (Patient not taking: Reported on 07/16/2023)     rosuvastatin (CRESTOR) 5 MG tablet Take 5 mg by mouth daily.     No current facility-administered medications for this visit.        Physical Exam There were no vitals taken for this visit. Gen:  WD/WN, NAD Skin: incision C/D/I     Assessment/Plan:  No problem-specific Assessment & Plan notes found for this encounter.      Levora Dredge 08/25/2023, 12:53 PM   This note was created with Dragon medical transcription system.  Any errors from dictation are unintentional.

## 2023-08-27 ENCOUNTER — Encounter (INDEPENDENT_AMBULATORY_CARE_PROVIDER_SITE_OTHER): Payer: Self-pay | Admitting: Vascular Surgery

## 2023-08-29 ENCOUNTER — Encounter (INDEPENDENT_AMBULATORY_CARE_PROVIDER_SITE_OTHER): Payer: Self-pay | Admitting: Vascular Surgery

## 2023-09-02 ENCOUNTER — Encounter (INDEPENDENT_AMBULATORY_CARE_PROVIDER_SITE_OTHER): Payer: Self-pay | Admitting: Vascular Surgery

## 2023-09-02 ENCOUNTER — Telehealth (INDEPENDENT_AMBULATORY_CARE_PROVIDER_SITE_OTHER): Payer: Self-pay

## 2023-09-02 NOTE — Telephone Encounter (Signed)
Patient left a message concern with leg pain and some inflammation. I left a message on patient voicemail to return call to the office or should could send a message through [facility].

## 2023-09-03 ENCOUNTER — Encounter (INDEPENDENT_AMBULATORY_CARE_PROVIDER_SITE_OTHER): Payer: Self-pay | Admitting: Vascular Surgery

## 2023-09-10 ENCOUNTER — Ambulatory Visit: Payer: Medicare Other

## 2023-09-18 ENCOUNTER — Encounter (INDEPENDENT_AMBULATORY_CARE_PROVIDER_SITE_OTHER): Payer: Self-pay | Admitting: Vascular Surgery

## 2023-09-19 ENCOUNTER — Encounter (INDEPENDENT_AMBULATORY_CARE_PROVIDER_SITE_OTHER): Payer: Self-pay | Admitting: Vascular Surgery

## 2023-09-25 ENCOUNTER — Other Ambulatory Visit: Payer: Self-pay | Admitting: Gastroenterology

## 2023-09-26 ENCOUNTER — Encounter: Payer: Self-pay | Admitting: Gastroenterology

## 2023-09-26 ENCOUNTER — Other Ambulatory Visit: Payer: Self-pay | Admitting: Gastroenterology

## 2023-09-26 ENCOUNTER — Other Ambulatory Visit: Payer: Self-pay

## 2023-09-26 MED ORDER — BUDESONIDE 3 MG PO CPEP: 9.0000 mg | ORAL_CAPSULE | ORAL | 2 refills | Status: DC

## 2023-10-02 ENCOUNTER — Other Ambulatory Visit: Payer: Self-pay | Admitting: Infectious Diseases

## 2023-10-02 DIAGNOSIS — R9389 Abnormal findings on diagnostic imaging of other specified body structures: Secondary | ICD-10-CM

## 2023-10-02 DIAGNOSIS — R0609 Other forms of dyspnea: Secondary | ICD-10-CM

## 2023-10-02 DIAGNOSIS — R5383 Other fatigue: Secondary | ICD-10-CM

## 2023-10-05 NOTE — Progress Notes (Signed)
 MRN : [id number]  Melanie Browning is a 83 y.o. (1940/11/03) female who presents with chief complaint of check circulation.  History of Present Illness:   The patient returns to the office for followup regarding atherosclerotic changes of the lower extremities and review of the noninvasive studies.   Procedure 07/16/2023: left common femoral artery to posterior tibial artery bypass with composite in situ saphenous vein with a spliced segment of reversed saphenous vein   There have been no interval changes in lower extremity symptoms. No interval shortening of the patient's claudication distance or development of rest pain symptoms. No new ulcers or wounds have occurred since the last visit.  There has been a changes to the patient's overall health care.  She was recently treated for pneumonia and is currently still undergoing follow up evaluation.  The patient denies amaurosis fugax or recent TIA symptoms. There are no documented recent neurological changes noted. There is no history of DVT, PE or superficial thrombophlebitis. The patient denies recent episodes of angina or shortness of breath.   ABI Rt=1.11 (triphasic) and Lt=1.03 (triphasic)  (previous ABI's Rt=1.07 and Lt=NA)   No outpatient medications have been marked as taking for the 10/06/23 encounter (Appointment) with Jama, Cordella MATSU, MD.    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)    a.) multiple vascular interventions (PTA, stenting, arthrectomy, and thrombectomy) of LEFT SFA, LEFT popliteal, LEFT posterior tibial, and LEFT anterior tibial arteries due to critical limb ischemia   CAD (coronary artery disease) 11/09/2020    a.) cCTA 11/09/2020: Ca2+ 539 (83rd %'ile for age/sex/race match control); <25% dRCA & pLCx, >70% pLAD; b.) LHC/PCI 11/15/2020: 90% pLAD (2.75 x 18 mm Resolute Onyx DES); c.) MV 06/26/2023: no sig ischemia   Cervical spondylosis    Colitis    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)    a.) s/p multiple vascular interventions   Diastolic dysfunction 06/26/2023   a.) TTE 06/26/2023: EF 60-65%, no RWMAs, G2DD, sev AoV thickening with mild AR, mild-mod AS (MPG 18.2 mmHg; AVA 0.73 cm2)   [place]   Dyslipidemia    History of cervical cancer    a.) s/p TAH   HPV (human papilloma virus) infection    Hyperlipidemia    Hypothyroidism    IGT (impaired glucose tolerance)    Left upper lobe pulmonary nodule    Long term current use of aspirin     Long term current use of ticagrelor  therapy    Macrocytic anemia    Osteoporosis    a.) Tx'd with zolodronic acid (Reclast) infusions   PAD (peripheral artery disease) (HCC)    Polymyalgia rheumatica (HCC)    Skin cancer of nose    Transaminitis    Vaginal dysplasia     Past Surgical History:  Procedure Laterality Date  COLONOSCOPY WITH PROPOFOL  N/A 10/04/2020   Procedure: COLONOSCOPY WITH PROPOFOL ;  Surgeon: Toledo, Ladell POUR, MD;  Location: [facility];  Service: Gastroenterology;  Laterality: N/A;   COLPOSCOPY     CORONARY STENT INTERVENTION N/A 11/15/2020   Procedure: CORONARY STENT INTERVENTION;  Surgeon: Florencio Cara BIRCH, MD;  Location: [facility];  Service: Cardiovascular;  Laterality: N/A;   FEMORAL-POPLITEAL BYPASS GRAFT Left 07/16/2023   Procedure: BYPASS GRAFT FEMORAL-POPLITEAL ARTERY (FEMORAL-POSTERIOR TIBIAL BYPASS W/ SAPHENOUS VEIN- [id number]);  Surgeon: Jama Cordella MATSU, MD;  Location: [facility];  Service: Vascular;  Laterality: Left;   KNEE ARTHROSCOPY Left    LAPAROSCOPIC HYSTERECTOMY     LEFT HEART CATH AND CORONARY ANGIOGRAPHY N/A 11/15/2020   Procedure: LEFT HEART  CATH AND CORONARY ANGIOGRAPHY;  Surgeon: Florencio Cara BIRCH, MD;  Location: [facility];  Service: Cardiovascular;  Laterality: N/A;   LOWER EXTREMITY ANGIOGRAPHY Left 09/05/2020   Procedure: LOWER EXTREMITY ANGIOGRAPHY;  Surgeon: Jama Cordella MATSU, MD;  Location: [facility];  Service: Cardiovascular;  Laterality: Left;   LOWER EXTREMITY ANGIOGRAPHY Left 04/10/2021   Procedure: LOWER EXTREMITY ANGIOGRAPHY;  Surgeon: Jama Cordella MATSU, MD;  Location: [facility];  Service: Cardiovascular;  Laterality: Left;   LOWER EXTREMITY ANGIOGRAPHY Left 11/20/2021   Procedure: Lower Extremity Angiography;  Surgeon: Jama Cordella MATSU, MD;  Location: [facility];  Service: Cardiovascular;  Laterality: Left;   LOWER EXTREMITY ANGIOGRAPHY Left 06/05/2022   Procedure: Lower Extremity Angiography;  Surgeon: Jama Cordella MATSU, MD;  Location: [facility];  Service: Cardiovascular;  Laterality: Left;   LOWER EXTREMITY ANGIOGRAPHY Left 07/23/2022   Procedure: Lower Extremity Angiography;  Surgeon: Jama Cordella MATSU, MD;  Location: [facility];  Service: Cardiovascular;  Laterality: Left;   LOWER EXTREMITY ANGIOGRAPHY Left 12/27/2022   Procedure: Lower Extremity Angiography;  Surgeon: Jama Cordella MATSU, MD;  Location: [facility];  Service: Cardiovascular;  Laterality: Left;   LOWER EXTREMITY ANGIOGRAPHY Left 06/17/2023   Procedure: Lower Extremity Angiography;  Surgeon: Jama Cordella MATSU, MD;  Location: [facility];  Service: Cardiovascular;  Laterality: Left;   TOTAL ABDOMINAL HYSTERECTOMY      Social History Social History   Tobacco Use   Smoking status: Never   Smokeless tobacco: Never  Vaping Use   Vaping status: Never Used  Substance Use Topics   Alcohol  use: Yes    Alcohol /week: 1.0 standard drink of alcohol     Types: 1 Glasses of wine per week   Drug use: Never    Family History Family History  Problem Relation Age of  Onset   Cancer Mother    Pancreatic cancer Mother    Heart failure Mother    Heart attack Father 57   Heart disease Father     Allergies  Allergen Reactions   Lactose Other (See Comments) and Nausea And Vomiting    Gi upset and diarrhea  Other reaction(s): Other (See Comments)  Gi upset and diarrhea  Other reaction(s): Other (See Comments) Gi upset and diarrhea Gi upset and diarrhea Gi upset and diarrhea    Gi upset and diarrhea    Gi upset and diarrhea Gi upset and diarrhea Gi upset and diarrhea  Gi upset and diarrhea    Gi upset and diarrhea  Other reaction(s): Other (See Comments) Gi upset and diarrhea Gi upset and diarrhea Gi upset and diarrhea  Gi upset and diarrhea  Gi upset and diarrhea Gi upset and diarrhea Gi  upset and diarrhea     REVIEW OF SYSTEMS (Negative unless checked)  Constitutional: [] Weight loss  [] Fever  [] Chills Cardiac: [] Chest pain   [] Chest pressure   [] Palpitations   [] Shortness of breath when laying flat   [] Shortness of breath with exertion. Vascular:  [x] Pain in legs with walking   [] Pain in legs at rest  [] History of DVT   [] Phlebitis   [] Swelling in legs   [] Varicose veins   [] Non-healing ulcers Pulmonary:   [] Uses home oxygen   [] Productive cough   [] Hemoptysis   [] Wheeze  [] COPD   [] Asthma Neurologic:  [] Dizziness   [] Seizures   [] History of stroke   [] History of TIA  [] Aphasia   [] Vissual changes   [] Weakness or numbness in arm   [] Weakness or numbness in leg Musculoskeletal:   [] Joint swelling   [] Joint pain   [] Low back pain Hematologic:  [] Easy bruising  [] Easy bleeding   [] Hypercoagulable state   [] Anemic Gastrointestinal:  [] Diarrhea   [] Vomiting  [] Gastroesophageal reflux/heartburn   [] Difficulty swallowing. Genitourinary:  [] Chronic kidney disease   [] Difficult urination  [] Frequent urination   [] Blood in urine Skin:  [] Rashes   [] Ulcers  Psychological:  [] History of anxiety   []  History of major depression.  Physical  Examination  There were no vitals filed for this visit. There is no height or weight on file to calculate BMI. Gen: WD/WN, NAD Head: [place]/AT, No temporalis wasting.  Ear/Nose/Throat: Hearing grossly intact, nares w/o erythema or drainage Eyes: PER, EOMI, sclera nonicteric.  Neck: Supple, no masses.  No bruit or JVD.  Pulmonary:  Good air movement, no audible wheezing, no use of accessory muscles.  Cardiac: RRR, normal S1, S2, no Murmurs. Vascular:  mild trophic changes, no open wounds Vessel Right Left  Radial Palpable Palpable  PT Palpable Trace Palpable  DP Palpable Trace Palpable  Gastrointestinal: soft, non-distended. No guarding/no peritoneal signs.  Musculoskeletal: M/S 5/5 throughout.  No visible deformity.  Neurologic: CN 2-12 intact. Pain and light touch intact in extremities.  Symmetrical.  Speech is fluent. Motor exam as listed above. Psychiatric: Judgment intact, Mood & affect appropriate for pt's clinical situation. Dermatologic: No rashes or ulcers noted.  No changes consistent with cellulitis.   CBC Lab Results  Component Value Date   WBC 6.5 07/23/2023   HGB 8.0 (L) 07/23/2023   HCT 24.6 (L) 07/23/2023   MCV 102.1 (H) 07/23/2023   PLT 248 07/23/2023    BMET    Component Value Date/Time   NA 135 07/23/2023 0507   K 4.1 07/23/2023 0507   CL 102 07/23/2023 0507   CO2 24 07/23/2023 0507   GLUCOSE 90 07/23/2023 0507   BUN 13 07/23/2023 0507   CREATININE 0.70 07/23/2023 0507   CALCIUM  8.4 (L) 07/23/2023 0507   GFRNONAA >60 07/23/2023 0507   CrCl cannot be calculated (Patient's most recent lab result is older than the maximum 21 days allowed.).  COAG No results found for: INR, PROTIME  Radiology No results found.   Assessment/Plan 1. 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  The patient  does not voice lifestyle limiting changes at this point in time.  Noninvasive studies do not suggest clinically significant change.  No invasive studies, angiography or surgery at this time The patient should continue walking and begin a more formal exercise program.  The patient should continue antiplatelet therapy  and aggressive treatment of the lipid abnormalities  No changes in the patient's medications at this time  Continued surveillance is indicated as atherosclerosis is likely to progress with time.    The patient will continue follow up with noninvasive studies as ordered.  - VAS US  LOWER EXTREMITY ARTERIAL DUPLEX; Future - VAS US  ABI WITH/WO TBI; Future  2. Chronic venous insufficiency Recommend:  No surgery or intervention at this point in time.  I have discussed with the patient the chronic venous skin changes that accompany venous insufficiency and the long term sequela such as ulceration. Patient will contnue wearing graduated compression stockings on a daily basis, as this has provided excellent control of his edema. The patient will put the stockings on first thing in the morning and removing them in the evening. The patient is reminded not to sleep in the stockings.  In addition, behavioral modification including elevation during the day will be initiated. Exercise is strongly encouraged.  Given the patient's good control and lack of any problems regarding the venous insufficiency and lymphedema a lymph pump in not need at this time.     3. Coronary artery disease of native artery of native heart with stable angina pectoris (HCC) Continue cardiac and antihypertensive medications as already ordered and reviewed, no changes at this time.  Continue statin as ordered and reviewed, no changes at this time  Nitrates PRN for chest pain  4. Mixed hyperlipidemia Continue statin as ordered and reviewed, no changes at this time    Cordella Shawl, MD  10/05/2023 2:09  PM

## 2023-10-06 ENCOUNTER — Encounter (INDEPENDENT_AMBULATORY_CARE_PROVIDER_SITE_OTHER): Payer: Self-pay | Admitting: Vascular Surgery

## 2023-10-06 ENCOUNTER — Ambulatory Visit (INDEPENDENT_AMBULATORY_CARE_PROVIDER_SITE_OTHER): Payer: Medicare Other

## 2023-10-06 ENCOUNTER — Ambulatory Visit (INDEPENDENT_AMBULATORY_CARE_PROVIDER_SITE_OTHER): Payer: Medicare Other | Admitting: Vascular Surgery

## 2023-10-06 VITALS — BP 148/72 | HR 73 | Resp 16 | Wt 138.0 lb

## 2023-10-06 DIAGNOSIS — I70212 Atherosclerosis of native arteries of extremities with intermittent claudication, left leg: Secondary | ICD-10-CM

## 2023-10-06 DIAGNOSIS — I25118 Atherosclerotic heart disease of native coronary artery with other forms of angina pectoris: Secondary | ICD-10-CM

## 2023-10-06 DIAGNOSIS — E782 Mixed hyperlipidemia: Secondary | ICD-10-CM

## 2023-10-06 DIAGNOSIS — I872 Venous insufficiency (chronic) (peripheral): Secondary | ICD-10-CM

## 2023-10-06 LAB — VAS US ABI WITH/WO TBI
Left ABI: 1.03
Right ABI: 1.11

## 2023-10-07 ENCOUNTER — Other Ambulatory Visit (INDEPENDENT_AMBULATORY_CARE_PROVIDER_SITE_OTHER): Payer: Self-pay | Admitting: Nurse Practitioner

## 2023-10-08 ENCOUNTER — Ambulatory Visit
Admission: RE | Admit: 2023-10-08 | Discharge: 2023-10-08 | Disposition: A | Discharge status: Home / Self Care | Payer: Medicare Other | Source: Ambulatory Visit | Attending: Infectious Diseases | Admitting: Infectious Diseases

## 2023-10-08 DIAGNOSIS — R9389 Abnormal findings on diagnostic imaging of other specified body structures: Secondary | ICD-10-CM | POA: Insufficient documentation

## 2023-10-08 DIAGNOSIS — R0609 Other forms of dyspnea: Secondary | ICD-10-CM | POA: Diagnosis present

## 2023-10-08 DIAGNOSIS — R5383 Other fatigue: Secondary | ICD-10-CM | POA: Insufficient documentation

## 2023-10-15 ENCOUNTER — Inpatient Hospital Stay: Payer: Medicare Other | Attending: Obstetrics and Gynecology | Admitting: Obstetrics and Gynecology

## 2023-10-15 VITALS — BP 153/76 | HR 71 | Temp 97.6°F | Resp 20 | Wt 142.9 lb

## 2023-10-15 DIAGNOSIS — Z7901 Long term (current) use of anticoagulants: Secondary | ICD-10-CM | POA: Diagnosis not present

## 2023-10-15 DIAGNOSIS — N893 Dysplasia of vagina, unspecified: Secondary | ICD-10-CM

## 2023-10-15 DIAGNOSIS — Z90710 Acquired absence of both cervix and uterus: Secondary | ICD-10-CM | POA: Insufficient documentation

## 2023-10-15 DIAGNOSIS — Z85828 Personal history of other malignant neoplasm of skin: Secondary | ICD-10-CM | POA: Insufficient documentation

## 2023-10-15 DIAGNOSIS — Z809 Family history of malignant neoplasm, unspecified: Secondary | ICD-10-CM | POA: Diagnosis not present

## 2023-10-15 DIAGNOSIS — Z7952 Long term (current) use of systemic steroids: Secondary | ICD-10-CM | POA: Diagnosis not present

## 2023-10-15 DIAGNOSIS — Z90722 Acquired absence of ovaries, bilateral: Secondary | ICD-10-CM | POA: Diagnosis not present

## 2023-10-15 DIAGNOSIS — Z8249 Family history of ischemic heart disease and other diseases of the circulatory system: Secondary | ICD-10-CM | POA: Diagnosis not present

## 2023-10-15 DIAGNOSIS — Z79899 Other long term (current) drug therapy: Secondary | ICD-10-CM | POA: Diagnosis not present

## 2023-10-15 DIAGNOSIS — Z800 Family history of malignant neoplasm of digestive organs: Secondary | ICD-10-CM | POA: Insufficient documentation

## 2023-10-15 DIAGNOSIS — E785 Hyperlipidemia, unspecified: Secondary | ICD-10-CM | POA: Diagnosis not present

## 2023-10-15 DIAGNOSIS — D539 Nutritional anemia, unspecified: Secondary | ICD-10-CM | POA: Insufficient documentation

## 2023-10-15 DIAGNOSIS — I739 Peripheral vascular disease, unspecified: Secondary | ICD-10-CM | POA: Insufficient documentation

## 2023-10-15 DIAGNOSIS — I70208 Unspecified atherosclerosis of native arteries of extremities, other extremity: Secondary | ICD-10-CM | POA: Diagnosis not present

## 2023-10-15 DIAGNOSIS — Z8541 Personal history of malignant neoplasm of cervix uteri: Secondary | ICD-10-CM | POA: Diagnosis not present

## 2023-10-15 DIAGNOSIS — N903 Dysplasia of vulva, unspecified: Secondary | ICD-10-CM | POA: Diagnosis present

## 2023-10-15 DIAGNOSIS — Z7902 Long term (current) use of antithrombotics/antiplatelets: Secondary | ICD-10-CM | POA: Insufficient documentation

## 2023-10-15 NOTE — Progress Notes (Signed)
 GYN ONC Interval [facility]  [phone] [phone]  Patient Care Team: Eartha Gold, MD as PCP - General (Infectious Diseases) Rochell Chroman, RN as Oncology Nurse Navigator   Name of the patient: Melanie Browning  [id number]  06-22-1941   Date of visit: 10/15/2023  Gynecologic Oncology Interval Visit   Referring Provider: Dr. 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 She is s/p TAH-BSO on 09/05/2015, but final surgical path was negative for dysplasia.  After surgery, she was closely monitored every 6 months with colposcopies and PAPs.   PAP 6/24 NILM, HPV negative.   Returns today for repeat exam with Pap. No new complaints. Still using vaginal estrogen twice a week.  Fem-pop bypass on left leg in October.  Takes Brilinta  bid in view of prior thromboses.   Gynecologic Oncology History Melanie Browning is a pleasant G1P1 female who is seen in consultation from Dr. Harwood Lingo for dysplasia. She is s/p TAH-BSO on 09/05/2015, but final surgical path was negative for dysplasia.  After surgery, she was closely monitored every 6 months with colposcopies and PAPs. Prior treatment in [place]  for cervical and vaginal dysplasia from 2016 to 08/2019.   9/16 LSIL PAP, HR HPV+, Colposcopic biopsies 10/16, CIN1 on ectocervix, ECC negative. 11/16 [facility] biopsy LSIL, clear margins. ECC negative.  09/05/2015 TLH/BSO Dr Virl Grimes in [facility] for abnormal PAP. No residual disease in specimen. PAP 08/2016 LGSIL  PAP 02/09/2017 HSIL Colposcopy 02/2017 VAIN 1 PAP 05/2017 NILM 10/30/2017 ASCUS, HPV 16+ Colposcopy 11/26/2017 VAIN 1 Colposcopy 03/17/2018 No obvious disease noted (as per note from Dr. Welton Hall on 09/28/2019)  PAP 03/25/2019 LSIL/HPV+ Colposcopy with biopsy 04/20/2019 VAIN 1  PAP 09/21/2019, patient reports VAIN 1,  HPV+ Colposcopy 7/21 normal PAP 10/25/20 HSIL, +HR HPV PAP 10/25/20 HSIL, -HR HPV  PAP 10/25/20 HSIL, +HR HPV Reported no gyn symptoms.   Colposcopy 2/22 negative We discussed options for management including surveillance versus Efudex  treatment.  She was anxious and preferred treatment.  We prescribed Efudex  once a week for 10 weeks and then RTC in 1-2 months for repeat PAP. She completed this course with some interruptions due to vulvar irritation despite skin protectants.   PAP 02/28/21 HSIL, HR HPV- No bleeding or discharge, slight odor.  Repeat Pap 04/18/21- ASC-H; cannot exclude high grade squamous intraepithelial.   Recommended she use Estradiol  cream to improve health of vaginal mucosa prior to repeat colposcopy.    06/06/2021 Biopsy to evaluate abnormal Pap  DIAGNOSIS:  A. VAGINA; BIOPSY:  - MINUTE FRAGMENT OF VAGINAL MUCOSA WITH PARTIALLY DETACHED EPITHELIUM  AND AT LEAST LOW-GRADE SQUAMOUS INTRAEPITHELIAL LESION (VAIN 1, MILD  DYSPLASIA).  - NEGATIVE FOR DEFINITE HIGH-GRADE SQUAMOUS INTRAEPITHELIAL LESION AND MALIGNANCY.   12/05/21 PAP HSIL/HPV+ Colposcopy 3/23 with lesion in left fornix, but path did not show significant lesion.   VAGINAL FORNIX, LEFT; BIOPSY:  - 1 FRAGMENT OF ATYPICAL SQUAMOUS MUCOSA ASSOCIATED WITH POSSIBLE  PREVIOUS BIOPSY SITE REACTION.  - SEPARATE FRAGMENT OF ATROPHIC VAGINAL MUCOSA.  05/08/2022 HGSIL vaginal pap; HRHPV positive. She using the estrogen vaginal therapy twice a week since recommended in August.  Follow up exam, PAP, and colposcopy vaginal biopsy 12/23 showed LSIL/VAIN1.   Other pertinent medical issues She had stent placed a few days ago in her leg due to artery narrowing.  On Plavix  and ASA.  Small blood  blister below right groin where vascular access was obtained. Bruises easily, but no bleeding.   She had colonoscopy for follow up of Lymphocytic colitis and that was stable.   Also having problems with intermittent claudication in left  leg and underwent vascular percutaneous procedures including a popliteal stent in 12/21.  She is taking Plavix  and ASA.   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   Muscle spasms of neck 04/06/2022   Cervical spondylosis 04/06/2022   Acute pain of left wrist 02/14/2022   Atherosclerosis of native arteries of extremity with rest pain (HCC) 11/20/2021   CAD (coronary artery disease) 12/10/2020   Coronary artery disease involving native coronary artery of native heart 11/30/2020   S/P drug eluting coronary stent placement 11/15/2020   Abdominal pain, LLQ (left lower quadrant) 09/28/2020   Long term current use of anticoagulant therapy 09/28/2020   Functional diarrhea 09/28/2020   Chronic venous insufficiency 04/23/2020   PAD (peripheral artery disease) (HCC) 04/19/2020   Hyperlipidemia 04/19/2020   Hypothyroidism 04/19/2020   Popliteal artery injury, left, 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   Disruption of external operation (surgical) wound, not elsewhere classified, initial encounter 11/09/2019   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) 10/29/2019   Stricture, artery (HCC) 10/29/2019   Critical lower limb ischemia (HCC) 10/13/2019   Hypothyroidism due to acquired atrophy of thyroid  04/07/2019   Syncope and collapse 04/07/2019   Senile osteoporosis 09/20/2016   Sleep disorder 07/18/2016   IGT (impaired glucose tolerance) 06/26/2015   Pap smear abnormality of cervix with ASCUS favoring dysplasia 06/26/2015   Family history of Guillain-Barre syndrome 04/26/2015   History of herpes genitalis 04/26/2015   History of HPV infection 04/26/2015   Dyslipidemia 04/17/2015   Lymphocytic colitis  04/17/2015   PMR (polymyalgia rheumatica) (HCC) 04/17/2015   Arthritis of shoulder region, 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   Past 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)    a.) multiple vascular interventions (PTA, stenting, arthrectomy, and thrombectomy) of LEFT SFA, LEFT popliteal, LEFT posterior tibial, and LEFT anterior tibial arteries due to critical limb ischemia   CAD (coronary artery disease) 11/09/2020   a.) cCTA 11/09/2020: Ca2+ 539 (83rd %'ile for age/sex/race match control); <25% dRCA & pLCx, >70% pLAD; b.) LHC/PCI 11/15/2020: 90% pLAD (2.75 x 18 mm Resolute Onyx DES); c.) MV 06/26/2023: no sig ischemia   Cervical spondylosis    Colitis    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)    a.) s/p multiple vascular interventions   Diastolic dysfunction 06/26/2023   a.) TTE 06/26/2023: EF 60-65%, no RWMAs, G2DD, sev AoV thickening with mild AR, mild-mod AS (MPG 18.2 mmHg; AVA 0.73 cm2)   [place]   Dyslipidemia    History of cervical cancer    a.) s/p TAH   HPV (human papilloma virus) infection    Hyperlipidemia    Hypothyroidism    IGT (impaired  glucose tolerance)    Left upper lobe pulmonary nodule    Long term current use of aspirin     Long term current use of ticagrelor  therapy    Macrocytic anemia    Osteoporosis    a.) Tx'd with zolodronic acid (Reclast) infusions   PAD (peripheral artery disease) (HCC)    Polymyalgia rheumatica (HCC)    Skin cancer of nose    Transaminitis    Vaginal dysplasia    Past Surgical History: Past Surgical History:  Procedure Laterality Date   COLONOSCOPY WITH PROPOFOL  N/A 10/04/2020   Procedure: COLONOSCOPY WITH  PROPOFOL ;  Surgeon: Toledo, Alphonsus Jeans, MD;  Location: [facility] ENDOSCOPY;  Service: Gastroenterology;  Laterality: N/A;   COLPOSCOPY     CORONARY STENT INTERVENTION N/A 11/15/2020   Procedure: CORONARY STENT INTERVENTION;  Surgeon: Antonette Batters, MD;  Location: [facility] IN[facility]IVE [facility];  Service: Cardiovascular;  Laterality: N/A;   FEMORAL-POPLITEAL BYPASS GRAFT Left 07/16/2023   Procedure: BYPASS GRAFT FEMORAL-POPLITEAL ARTERY (FEMORAL-POSTERIOR TIBIAL BYPASS W/ SAPHENOUS VEIN- [id number]);  Surgeon: Jackquelyn Mass, MD;  Location: [facility] ORS;  Service: Vascular;  Laterality: Left;   KNEE ARTHROSCOPY Left    LAPAROSCOPIC HYSTERECTOMY     LEFT HEART CATH AND CORONARY ANGIOGRAPHY N/A 11/15/2020   Procedure: LEFT HEART CATH AND CORONARY ANGIOGRAPHY;  Surgeon: Antonette Batters, MD;  Location: [facility];  Service: Cardiovascular;  Laterality: N/A;   LOWER EXTREMITY ANGIOGRAPHY Left 09/05/2020   Procedure: LOWER EXTREMITY ANGIOGRAPHY;  Surgeon: Jackquelyn Mass, MD;  Location: [facility];  Service: Cardiovascular;  Laterality: Left;   LOWER EXTREMITY ANGIOGRAPHY Left 04/10/2021   Procedure: LOWER EXTREMITY ANGIOGRAPHY;  Surgeon: Jackquelyn Mass, MD;  Location: [facility];  Service: Cardiovascular;  Laterality: Left;   LOWER EXTREMITY ANGIOGRAPHY Left 11/20/2021   Procedure: Lower Extremity Angiography;  Surgeon: Jackquelyn Mass, MD;  Location: [facility];  Service: Cardiovascular;  Laterality: Left;   LOWER EXTREMITY ANGIOGRAPHY Left 06/05/2022   Procedure: Lower Extremity Angiography;  Surgeon: Jackquelyn Mass, MD;  Location: [facility];  Service: Cardiovascular;  Laterality: Left;   LOWER EXTREMITY ANGIOGRAPHY Left 07/23/2022   Procedure: Lower Extremity Angiography;  Surgeon: Jackquelyn Mass, MD;  Location: [facility];  Service: Cardiovascular;  Laterality: Left;   LOWER EXTREMITY ANGIOGRAPHY Left 12/27/2022   Procedure: Lower  Extremity Angiography;  Surgeon: Jackquelyn Mass, MD;  Location: [facility];  Service: Cardiovascular;  Laterality: Left;   LOWER EXTREMITY ANGIOGRAPHY Left 06/17/2023   Procedure: Lower Extremity Angiography;  Surgeon: Jackquelyn Mass, MD;  Location: [facility];  Service: Cardiovascular;  Laterality: Left;   TOTAL ABDOMINAL HYSTERECTOMY     Past Gynecologic History:  Menarche: 14 Menstrual details: Lasts 3 days Menses regular: yes Last Menstrual Period: Unknown History of OCP/HRT use: No History of Abnormal pap: yes,  as per HPI Last pap:  08/2019 History of STDs: The patient reports a past history of: herpes. Sexually active: not asked  OB History:  OB History  No obstetric history on file.   Family History: Family History  Problem Relation Age of Onset   Cancer Mother    Pancreatic cancer Mother    Heart failure Mother    Heart attack Father 29   Heart disease Father    Social History: Social History   Socioeconomic History   Marital status: Married    Spouse name: Norm    Number of children:  1   Years of education: Not on file   Highest education level: Not on file  Occupational History   Occupation: retired   Tobacco Use   Smoking status: Never   Smokeless tobacco: Never  Vaping Use   Vaping status: Never Used  Substance and Sexual Activity   Alcohol  use: Yes    Alcohol /week: 1.0 standard drink of alcohol     Types: 1 Glasses of wine per week   Drug use: Never   Sexual activity: Yes  Other Topics Concern   Not on file  Social History Narrative   L[facility]    Social Drivers of Health   Financial Resource Strain: Low Risk  (12/13/2022)   Received from [facility], [facility]   Overall Financial Resource Strain (CARDIA)    Difficulty of Paying Living Expenses: Not hard at all  Food Insecurity: No Food Insecurity (07/18/2023)   Hunger Vital Sign    Worried About Running  Out of Food in the Last Year: Never true    Ran Out of Food in the Last Year: Never true  Transportation Needs: No Transportation Needs (07/18/2023)   PRAPARE - Administrator, Civil Service (Medical): No    Lack of Transportation (Non-Medical): No  Physical Activity: Not on file  Stress: Not on file  Social Connections: Not on file  Intimate Partner Violence: Not At Risk (07/18/2023)   Humiliation, Afraid, Rape, and Kick questionnaire    Fear of Current or Ex-Partner: No    Emotionally Abused: No    Physically Abused: No    Sexually Abused: No    Allergies: Allergies  Allergen Reactions   Lactose Other (See Comments) and Nausea And Vomiting    Gi upset and diarrhea  Other reaction(s): Other (See Comments)  Gi upset and diarrhea  Other reaction(s): Other (See Comments) Gi upset and diarrhea Gi upset and diarrhea Gi upset and diarrhea    Gi upset and diarrhea    Gi upset and diarrhea Gi upset and diarrhea Gi upset and diarrhea  Gi upset and diarrhea    Gi upset and diarrhea  Other reaction(s): Other (See Comments) Gi upset and diarrhea Gi upset and diarrhea Gi upset and diarrhea  Gi upset and diarrhea  Gi upset and diarrhea Gi upset and diarrhea Gi upset and diarrhea    Current Medications: Current Outpatient Medications  Medication Sig Dispense Refill   aspirin  EC 81 MG tablet Take 81 mg by mouth every evening. Swallow whole.     B Complex Vitamins (VITAMIN B-COMPLEX) TABS Take 1 tablet by mouth daily.     BRILINTA  90 MG TABS tablet TAKE ONE TABLET BY MOUTH TWICE DAILY 180 tablet 0   budesonide  (ENTOCORT EC ) 3 MG 24 hr capsule Take 9 mg by mouth daily.     budesonide  (ENTOCORT EC ) 3 MG 24 hr capsule Take 3 capsules (9 mg total) by mouth every morning. 270 capsule 2   diphenoxylate -atropine  (LOMOTIL ) 2.5-0.025 MG tablet TAKE 1 TABLET BY MOUTH 3 TIMES DAILY AS NEEDED FOR DIARRHEA. 90 tablet 0   estradiol  (ESTRACE ) 0.1 MG/GM vaginal cream Insert 1/4 applicator  full into vagina at night twice a week 42.5 g 2   HYDROcodone -acetaminophen  (NORCO) 10-325 MG tablet Take 1 tablet by mouth every 6 (six) hours as needed for severe pain (pain score 7-10). 40 tablet 0   levothyroxine  (SYNTHROID ) 75 MCG tablet Take 75 mcg by mouth daily before breakfast.  losartan  (COZAAR ) 25 MG tablet Take 25 mg by mouth daily.     nitroGLYCERIN  (NITROSTAT ) 0.4 MG SL tablet Place under the tongue. (Patient not taking: Reported on 07/16/2023)     rosuvastatin (CRESTOR) 5 MG tablet Take 5 mg by mouth daily.     No current facility-administered medications for this visit.    Review of Systems General: no complaints  HEENT: no complaints  Lungs: no complaints  Cardiac: no complaints  GI: no complaints  GU: no complaints  Musculoskeletal: no complaints  Extremities: no complaints  Skin: no complaints  Neuro: no complaints  Endocrine: no complaints  Psych: no complaints        Objective:  Physical Examination:  BP (!) 153/76   Pulse 71   Temp 97.6 F (36.4 C)   Resp 20   Wt 142 lb 14.4 oz (64.8 kg)   SpO2 100%   BMI 21.73 kg/m    ECOG Performance Status: 0 - Asymptomatic  GENERAL: Patient is a well appearing female in no acute distress HEENT: Atraumatic normocephalic NODES:  No inguinal lymphadenopathy palpated.  ABDOMEN:  Soft, nontender, nondistended.   EXTREMITIES:  No peripheral edema.   NEURO:  Nonfocal. Well oriented.  Appropriate affect.  Pelvic: Noted by NP EGBUS: no lesions Cervix: Surgically absent  Vagina: Atrophic and shortened with prominent dimples in fornices.  Pap smear was obtained. No lesions noted.  There was no discharge or active bleeding. Uterus: Surgically absent BME: no palpable masses  Assessment:  Melanie Browning is a 83 y.o. female s/p TAH-BSO 09/05/2015 for dysplasia but no dysplasia in specimen.  Low grade PAP abnormalities since then with colposcopy negative.  PAP 12/20 VAIN1 HPV+.  Moved to this area and first seen  7/21 with normal colposcopy.  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  Colposcopy of vagina 2/22 with no lesions seen.   No symptoms. We discussed options for management including surveillance versus Efudex  treatment.  She is anxious.and preferred treatment.  She used  Efudex  once a week for 10 weeks with some interruption for vulvar irritation. PAP 02/28/21 HSIL, HR HPV-. Confirmed on [facility] review.  04/18/21- ASC-H; cannot exclude high grade squamous intraepithelial. On colposcopy 06/06/2021 with Lugol's small area of non staining biopsied, VAIN1. PAP 3/23 showed HSIL/HPV+.  On colposcopy an area with punctation seen in the left fornix.  Biopsy left fornix 12/26/21 atypia only.  05/08/2022 HGSIL vaginal pap; HRHPV positive.  Colposcopy showed VAIN1.  PAP 6/24 NILM, HPV negative.  No new symptoms.  Using vaginal estrogen twice weekly.  No complaints today.  Vaginal atrophy.  Recent arterial stent with vascular for arterial narrowing in right popliteal area. Patient on Plavix  and ASA. Has gotten second opinion and may have a vascular bypass procedure.    Medical co-morbidities complicating care: PAD, Dyslipidemia  Plan:   Problem List Items Addressed This Visit       Genitourinary   Vaginal dysplasia - Primary   Relevant Orders   IGP, Aptima HPV   PAP done.  Continue vaginal estrogen in view of atrophy and abnormal PAPs, which did aid visualization.  If surgery for vaginectomy recommended in the future, may ask Dr Twylla Galen to do this, as UroGyn much more comfortable with this dissection and avoidance of the bladder.  Plan for return in 6 months.  The patient's diagnosis, an outline of the further diagnostic and laboratory studies which will be required, the recommendation, and alternatives were discussed.  All questions were answered to the patient's satisfaction.  Hermine Loots, MD

## 2023-10-21 LAB — IGP, APTIMA HPV: HPV Aptima: POSITIVE — AB

## 2023-10-28 ENCOUNTER — Encounter (INDEPENDENT_AMBULATORY_CARE_PROVIDER_SITE_OTHER): Payer: Self-pay | Admitting: Vascular Surgery

## 2023-10-29 ENCOUNTER — Encounter: Payer: Self-pay | Admitting: Nurse Practitioner

## 2023-10-30 ENCOUNTER — Other Ambulatory Visit (INDEPENDENT_AMBULATORY_CARE_PROVIDER_SITE_OTHER): Payer: Self-pay | Admitting: Vascular Surgery

## 2023-10-30 DIAGNOSIS — Z9889 Other specified postprocedural states: Secondary | ICD-10-CM

## 2023-10-30 DIAGNOSIS — R1909 Other intra-abdominal and pelvic swelling, mass and lump: Secondary | ICD-10-CM

## 2023-11-03 ENCOUNTER — Ambulatory Visit (INDEPENDENT_AMBULATORY_CARE_PROVIDER_SITE_OTHER): Payer: Medicare Other

## 2023-11-03 DIAGNOSIS — R1909 Other intra-abdominal and pelvic swelling, mass and lump: Secondary | ICD-10-CM

## 2023-11-03 DIAGNOSIS — Z9889 Other specified postprocedural states: Secondary | ICD-10-CM

## 2023-11-03 DIAGNOSIS — I739 Peripheral vascular disease, unspecified: Secondary | ICD-10-CM

## 2023-11-30 NOTE — Progress Notes (Unsigned)
 MRN : [id number]  Melanie Browning is a 83 y.o. (December 11, 1940) female who presents with chief complaint of check circulation.  History of Present Illness:   The patient returns to the office for followup regarding atherosclerotic changes of the lower extremities and review of the noninvasive studies.    Procedure 07/16/2023: left common femoral artery to posterior tibial artery bypass with composite in situ saphenous vein with a spliced segment of reversed saphenous vein    There have been no interval changes in lower extremity symptoms. No interval shortening of the patient's claudication distance or development of rest pain symptoms. No new ulcers or wounds have occurred since the last visit.   There has been a changes to the patient's overall health care.  She was recently treated for pneumonia and is currently still undergoing follow up evaluation.   The patient denies amaurosis fugax or recent TIA symptoms. There are no documented recent neurological changes noted. There is no history of DVT, PE or superficial thrombophlebitis. The patient denies recent episodes of angina or shortness of breath.    ABI Rt=1.11 (triphasic) and Lt=1.03 (triphasic)  (previous ABI's Rt=1.07 and Lt=NA)  No outpatient medications have been marked as taking for the 12/01/23 encounter (Appointment) with Gilda Crease, Latina Craver, MD.    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)    a.) multiple vascular interventions (PTA, stenting, arthrectomy, and thrombectomy) of LEFT SFA, LEFT popliteal, LEFT posterior tibial, and LEFT anterior tibial arteries due to critical limb ischemia   CAD (coronary artery disease) 11/09/2020    a.) cCTA 11/09/2020: Ca2+ 539 (83rd %'ile for age/sex/race match control); <25% dRCA & pLCx, >70% pLAD; b.) LHC/PCI 11/15/2020: 90% pLAD (2.75 x 18 mm Resolute Onyx DES); c.) MV 06/26/2023: no sig ischemia   Cervical spondylosis    Colitis    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)    a.) s/p multiple vascular interventions   Diastolic dysfunction 06/26/2023   a.) TTE 06/26/2023: EF 60-65%, no RWMAs, G2DD, sev AoV thickening with mild AR, mild-mod AS (MPG 18.2 mmHg; AVA 0.73 cm2)   [place]   Dyslipidemia    History of cervical cancer    a.) s/p TAH   HPV (human papilloma virus) infection    Hyperlipidemia    Hypothyroidism    IGT (impaired glucose tolerance)    Left upper lobe pulmonary nodule    Long term current use of aspirin    Long term current use of ticagrelor therapy    Macrocytic anemia    Osteoporosis    a.) Tx'd with zolodronic acid (Reclast) infusions   PAD (peripheral artery disease) (HCC)    Polymyalgia rheumatica (HCC)    Skin cancer of nose    Transaminitis    Vaginal dysplasia     Past Surgical History:  Procedure Laterality Date   COLONOSCOPY WITH PROPOFOL N/A 10/04/2020   Procedure: COLONOSCOPY WITH PROPOFOL;  Surgeon: Toledo, Boykin Nearing, MD;  Location: [facility];  Service: Gastroenterology;  Laterality: N/A;   COLPOSCOPY     CORONARY STENT INTERVENTION N/A 11/15/2020   Procedure: CORONARY STENT INTERVENTION;  Surgeon: Alwyn Pea, MD;  Location: [facility];  Service: Cardiovascular;  Laterality: N/A;   FEMORAL-POPLITEAL BYPASS GRAFT Left 07/16/2023   Procedure: BYPASS GRAFT FEMORAL-POPLITEAL ARTERY (FEMORAL-POSTERIOR TIBIAL BYPASS W/ SAPHENOUS VEIN- [id number]);  Surgeon: Renford Dills, MD;  Location: [facility];  Service: Vascular;  Laterality: Left;   KNEE ARTHROSCOPY Left    LAPAROSCOPIC HYSTERECTOMY     LEFT HEART CATH AND CORONARY ANGIOGRAPHY N/A 11/15/2020   Procedure: LEFT HEART  CATH AND CORONARY ANGIOGRAPHY;  Surgeon: Alwyn Pea, MD;  Location: [facility];  Service: Cardiovascular;  Laterality: N/A;   LOWER EXTREMITY ANGIOGRAPHY Left 09/05/2020   Procedure: LOWER EXTREMITY ANGIOGRAPHY;  Surgeon: Renford Dills, MD;  Location: [facility];  Service: Cardiovascular;  Laterality: Left;   LOWER EXTREMITY ANGIOGRAPHY Left 04/10/2021   Procedure: LOWER EXTREMITY ANGIOGRAPHY;  Surgeon: Renford Dills, MD;  Location: [facility];  Service: Cardiovascular;  Laterality: Left;   LOWER EXTREMITY ANGIOGRAPHY Left 11/20/2021   Procedure: Lower Extremity Angiography;  Surgeon: Renford Dills, MD;  Location: [facility];  Service: Cardiovascular;  Laterality: Left;   LOWER EXTREMITY ANGIOGRAPHY Left 06/05/2022   Procedure: Lower Extremity Angiography;  Surgeon: Renford Dills, MD;  Location: [facility];  Service: Cardiovascular;  Laterality: Left;   LOWER EXTREMITY ANGIOGRAPHY Left 07/23/2022   Procedure: Lower Extremity Angiography;  Surgeon: Renford Dills, MD;  Location: [facility];  Service: Cardiovascular;  Laterality: Left;   LOWER EXTREMITY ANGIOGRAPHY Left 12/27/2022   Procedure: Lower Extremity Angiography;  Surgeon: Renford Dills, MD;  Location: [facility];  Service: Cardiovascular;  Laterality: Left;   LOWER EXTREMITY ANGIOGRAPHY Left 06/17/2023   Procedure: Lower Extremity Angiography;  Surgeon: Renford Dills, MD;  Location: [facility];  Service: Cardiovascular;  Laterality: Left;   TOTAL ABDOMINAL HYSTERECTOMY      Social History Social History   Tobacco Use   Smoking status: Never   Smokeless tobacco: Never  Vaping Use   Vaping status: Never Used  Substance Use Topics   Alcohol use: Yes    Alcohol/week: 1.0 standard drink of alcohol    Types: 1 Glasses of wine per week   Drug use: Never    Family History Family History  Problem Relation Age of  Onset   Cancer Mother    Pancreatic cancer Mother    Heart failure Mother    Heart attack Father 68   Heart disease Father     Allergies  Allergen Reactions   Lactose Other (See Comments) and Nausea And Vomiting    Gi upset and diarrhea  Other reaction(s): Other (See Comments)  Gi upset and diarrhea  Other reaction(s): Other (See Comments) Gi upset and diarrhea Gi upset and diarrhea Gi upset and diarrhea    Gi upset and diarrhea    Gi upset and diarrhea Gi upset and diarrhea Gi upset and diarrhea  Gi upset and diarrhea    Gi upset and diarrhea  Other reaction(s): Other (See Comments) Gi upset and diarrhea Gi upset and diarrhea Gi upset and diarrhea  Gi upset and diarrhea  Gi upset and diarrhea  Gi upset and diarrhea Gi upset and diarrhea     REVIEW OF SYSTEMS (Negative unless checked)  Constitutional: [] Weight loss  [] Fever  [] Chills Cardiac: [] Chest pain   [] Chest pressure   [] Palpitations   [] Shortness of breath when laying flat   [] Shortness of breath with exertion. Vascular:  [x] Pain in legs with walking   [] Pain in legs at rest  [] History of DVT   [] Phlebitis   [] Swelling in legs   [] Varicose veins   [] Non-healing ulcers Pulmonary:   [] Uses home oxygen   [] Productive cough   [] Hemoptysis   [] Wheeze  [] COPD   [] Asthma Neurologic:  [] Dizziness   [] Seizures   [] History of stroke   [] History of TIA  [] Aphasia   [] Vissual changes   [] Weakness or numbness in arm   [] Weakness or numbness in leg Musculoskeletal:   [] Joint swelling   [] Joint pain   [] Low back pain Hematologic:  [] Easy bruising  [] Easy bleeding   [] Hypercoagulable state   [] Anemic Gastrointestinal:  [] Diarrhea   [] Vomiting  [] Gastroesophageal reflux/heartburn   [] Difficulty swallowing. Genitourinary:  [] Chronic kidney disease   [] Difficult urination  [] Frequent urination   [] Blood in urine Skin:  [] Rashes   [] Ulcers  Psychological:  [] History of anxiety   []  History of major depression.  Physical  Examination  There were no vitals filed for this visit. There is no height or weight on file to calculate BMI. Gen: WD/WN, NAD Head: [place]/AT, No temporalis wasting.  Ear/Nose/Throat: Hearing grossly intact, nares w/o erythema or drainage Eyes: PER, EOMI, sclera nonicteric.  Neck: Supple, no masses.  No bruit or JVD.  Pulmonary:  Good air movement, no audible wheezing, no use of accessory muscles.  Cardiac: RRR, normal S1, S2, no Murmurs. Vascular:  mild trophic changes, no open wounds Vessel Right Left  Radial Palpable Palpable  PT Not Palpable Not Palpable  DP Not Palpable Not Palpable  Gastrointestinal: soft, non-distended. No guarding/no peritoneal signs.  Musculoskeletal: M/S 5/5 throughout.  No visible deformity.  Neurologic: CN 2-12 intact. Pain and light touch intact in extremities.  Symmetrical.  Speech is fluent. Motor exam as listed above. Psychiatric: Judgment intact, Mood & affect appropriate for pt's clinical situation. Dermatologic: No rashes or ulcers noted.  No changes consistent with cellulitis.   CBC Lab Results  Component Value Date   WBC 6.5 07/23/2023   HGB 8.0 (L) 07/23/2023   HCT 24.6 (L) 07/23/2023   MCV 102.1 (H) 07/23/2023   PLT 248 07/23/2023    BMET    Component Value Date/Time   NA 135 07/23/2023 0507   K 4.1 07/23/2023 0507   CL 102 07/23/2023 0507   CO2 24 07/23/2023 0507   GLUCOSE 90 07/23/2023 0507   BUN 13 07/23/2023 0507   CREATININE 0.70 07/23/2023 0507   CALCIUM 8.4 (L) 07/23/2023 0507   GFRNONAA >60 07/23/2023 0507   CrCl cannot be calculated (Patient's most recent lab result is older than the maximum 21 days allowed.).  COAG No results found for: "INR", "PROTIME"  Radiology VAS [place] GROIN PSEUDOANEURYSM Result Date: 11/03/2023  ARTERIAL PSEUDOANEURYSM  Patient Name:  Melanie Browning  Date of Exam:   11/03/2023 Medical Rec #: [id number]      Accession #:    [id number] Date of Birth: 1941-09-12       Patient Gender: F Patient Age:   23  years Exam Location:  [facility] Procedure:      VAS [place] GROIN PSEUDOANEURYSM Referring Phys: Levora Dredge --------------------------------------------------------------------------------  Exam: 2.2cm hematoma seen on November 2024 study Indications: Patient complains of palpable knot. History: 07/16/2023 left common femoral artery to posterior tibial artery bypass with composite in situ saphenous vein with a spliced segment of reversed saphenous vein. Comparison Study: 08/12/2023 Performing Technologist: Salvadore Farber RVT  Examination Guidelines: A complete evaluation includes B-mode imaging, spectral Doppler, color Doppler, and power Doppler as needed of all accessible portions of each vessel. Bilateral testing is considered an integral part of a complete examination. Limited examinations for reoccurring indications may be performed as noted. +-----------+----------+--------+------+----------+ Left DuplexPSV (cm/s)WaveformPlaqueComment(s) +-----------+----------+--------+------+----------+ CFA            97                             +-----------+----------+--------+------+----------+ Prox SFA      103                             +-----------+----------+--------+------+----------+ Left Vein comments:  Findings: A mixed echogenic structure measuring approximately 2.7 cm x 3.5 cm is visualized at the anterior to CFA with ultrasound characteristics of a hematoma.  Summary: No evidence of pseudoaneurysm, AVF or DVT Hematoma is slightly larger than on previous exam but no flow is seen within.  Diagnosing physician: Levora Dredge MD Electronically signed by Levora Dredge MD on 11/03/2023 at 2:33:10 PM.    --------------------------------------------------------------------------------    Final      Assessment/Plan There are no diagnoses linked to this encounter.   Levora Dredge, MD  11/30/2023 1:49 PM

## 2023-12-01 ENCOUNTER — Ambulatory Visit (INDEPENDENT_AMBULATORY_CARE_PROVIDER_SITE_OTHER): Payer: Medicare Other

## 2023-12-01 ENCOUNTER — Ambulatory Visit (INDEPENDENT_AMBULATORY_CARE_PROVIDER_SITE_OTHER): Payer: Medicare Other | Admitting: Vascular Surgery

## 2023-12-01 ENCOUNTER — Encounter (INDEPENDENT_AMBULATORY_CARE_PROVIDER_SITE_OTHER): Payer: Self-pay | Admitting: Vascular Surgery

## 2023-12-01 VITALS — BP 125/70 | HR 74 | Resp 16 | Wt 142.0 lb

## 2023-12-01 DIAGNOSIS — E782 Mixed hyperlipidemia: Secondary | ICD-10-CM

## 2023-12-01 DIAGNOSIS — I25118 Atherosclerotic heart disease of native coronary artery with other forms of angina pectoris: Secondary | ICD-10-CM | POA: Diagnosis not present

## 2023-12-01 DIAGNOSIS — I872 Venous insufficiency (chronic) (peripheral): Secondary | ICD-10-CM

## 2023-12-01 DIAGNOSIS — I70212 Atherosclerosis of native arteries of extremities with intermittent claudication, left leg: Secondary | ICD-10-CM

## 2023-12-01 DIAGNOSIS — S85002S Unspecified injury of popliteal artery, left leg, sequela: Secondary | ICD-10-CM

## 2023-12-01 LAB — VAS US ABI WITH/WO TBI
Left ABI: 0.98
Right ABI: 1.11

## 2023-12-08 ENCOUNTER — Other Ambulatory Visit (INDEPENDENT_AMBULATORY_CARE_PROVIDER_SITE_OTHER): Payer: Self-pay | Admitting: Nurse Practitioner

## 2023-12-08 ENCOUNTER — Other Ambulatory Visit (INDEPENDENT_AMBULATORY_CARE_PROVIDER_SITE_OTHER): Payer: Self-pay

## 2023-12-16 ENCOUNTER — Ambulatory Visit: Payer: Medicare Other | Admitting: Cardiovascular Disease

## 2023-12-24 ENCOUNTER — Other Ambulatory Visit: Payer: Self-pay

## 2023-12-24 ENCOUNTER — Ambulatory Visit: Payer: Medicare Other | Admitting: Nurse Practitioner

## 2023-12-24 ENCOUNTER — Emergency Department

## 2023-12-24 ENCOUNTER — Encounter: Payer: Self-pay | Admitting: Emergency Medicine

## 2023-12-24 ENCOUNTER — Emergency Department
Admission: EM | Admit: 2023-12-24 | Discharge: 2023-12-24 | Disposition: A | Discharge status: Home / Self Care | Attending: Emergency Medicine | Admitting: Emergency Medicine

## 2023-12-24 DIAGNOSIS — R079 Chest pain, unspecified: Secondary | ICD-10-CM | POA: Diagnosis present

## 2023-12-24 DIAGNOSIS — M25512 Pain in left shoulder: Secondary | ICD-10-CM | POA: Insufficient documentation

## 2023-12-24 DIAGNOSIS — M542 Cervicalgia: Secondary | ICD-10-CM | POA: Diagnosis not present

## 2023-12-24 DIAGNOSIS — R0789 Other chest pain: Secondary | ICD-10-CM | POA: Insufficient documentation

## 2023-12-24 LAB — TROPONIN I (HIGH SENSITIVITY)
Troponin I (High Sensitivity): 11 ng/L (ref <18)
Troponin I (High Sensitivity): 10 ng/L (ref <18)

## 2023-12-24 LAB — BASIC METABOLIC PANEL
Anion gap: 10 (ref 5–15)
BUN: 21 mg/dL (ref 8–23)
CO2: 20 mmol/L — ABNORMAL LOW (ref 22–32)
Calcium: 9 mg/dL (ref 8.9–10.3)
Chloride: 106 mmol/L (ref 98–111)
Creatinine, Ser: 0.8 mg/dL (ref 0.44–1.00)
GFR, Estimated: >60 mL/min (ref >60)
Glucose, Bld: 117 mg/dL — ABNORMAL HIGH (ref 70–99)
Potassium: 3.7 mmol/L (ref 3.5–5.1)
Sodium: 136 mmol/L (ref 135–145)

## 2023-12-24 LAB — CBC
HCT: 35.8 % — ABNORMAL LOW (ref 36.0–46.0)
Hemoglobin: 11.6 g/dL — ABNORMAL LOW (ref 12.0–15.0)
MCH: 32.4 pg (ref 26.0–34.0)
MCHC: 32.4 g/dL (ref 30.0–36.0)
MCV: 100 fL (ref 80.0–100.0)
Platelets: 172 10*3/uL (ref 150–400)
RBC: 3.58 MIL/uL — ABNORMAL LOW (ref 3.87–5.11)
RDW: 15.2 % (ref 11.5–15.5)
WBC: 12.3 10*3/uL — ABNORMAL HIGH (ref 4.0–10.5)
nRBC: 0 % (ref 0.0–0.2)

## 2023-12-24 MED ORDER — LIDOCAINE 5 % EX PTCH
1.0000 | MEDICATED_PATCH | CUTANEOUS | Status: DC
  Administered 2023-12-24: 1 via TRANSDERMAL
  Filled 2023-12-24: qty 1

## 2023-12-24 MED ORDER — ACETAMINOPHEN 325 MG PO TABS
650.0000 mg | ORAL_TABLET | Freq: Once | ORAL | Status: AC
  Administered 2023-12-24: 650 mg via ORAL
  Filled 2023-12-24: qty 2

## 2023-12-24 MED ORDER — LIDOCAINE 5 % EX PTCH: 1.0000 | MEDICATED_PATCH | Freq: Two times a day (BID) | CUTANEOUS | 0 refills | Status: AC

## 2023-12-24 NOTE — ED Triage Notes (Signed)
 Patient to ED from [facility] for left sided CP states feels heavy. Radiates to left arm and neck. Ongoing x2 days. Hx stent placement.

## 2023-12-24 NOTE — Discharge Instructions (Addendum)
 You can take Tylenol 1 g every 8 hours to help with pain and use the lidocaine patches.  Please follow-up with orthopedic doctor return to the ER if develop worsening symptoms or any other concerns however at this time I suspect this is more related to musculoskeletal.

## 2023-12-24 NOTE — ED Provider Notes (Signed)
 [facility] Provider Note    Event Date/Time   First MD Initiated Contact with Patient 12/24/23 1012     (approximate)   History   Chest Pain   HPI  Melanie Browning is a 83 y.o. female who comes in from the [facility] due to concerns of left-sided neck pain radiating to the shoulder and into the axillary.  Patient reports taking a Norco prior to coming in.  Patient reports the pain started about 2 days ago but is more pain in her shoulder.  She reports that she does stay very active and does some workout classes.  She denies any known injuries but she states that every time she tries to lift up her arm she is got pain in her shoulder.  She states that today however she noticed a little bit more pressure in her chest which she states that she did not have previously.  She states that even when she was not moving the arm she seemed to have a little pain there which is why she presented today.  She denies any numbness, tingling.  She reports sensation is the same.  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  She is also gone through peripheral artery disease surgeries of the leg and has been compliant with her Plavix twice daily.  She denies any pain radiating into her back.  She states that she just want to make sure that there was not a heart attack.     Physical Exam   Triage Vital Signs: ED Triage Vitals [12/24/23 0940]  Encounter Vitals Group     BP (!) 106/59     Systolic BP Percentile      Diastolic BP Percentile      Pulse Rate 73     Resp 18     Temp 98 F (36.7 C)     Temp Source Oral     SpO2 97 %     Weight 141 lb 1.5 oz (64 kg)     Height 5\' 8"  (1.727 m)     Head Circumference      Peak Flow      Pain Score 2     Pain Loc      Pain Education      Exclude from Growth Chart     Most recent vital signs: Vitals:   12/24/23 0940   BP: (!) 106/59  Pulse: 73  Resp: 18  Temp: 98 F (36.7 C)  SpO2: 97%     General: Awake, no distress.  CV:  Good peripheral perfusion.  Resp:  Normal effort.  Abd:  No distention.  Other:  Patient has pain on the left shoulder.  She is has difficulty raising the arm past 90 degrees and reports significantly worsening pain with trying to lift arm.  She also reports pain with sitting up, movements.  She is good radial pulses.  No numbness, no tingling.  No rash noted.  Equal grip strength.  No pronator drift.  Median, radial, ulnar nerve are intact.  No swelling in the arm.    ED Results / Procedures / Treatments   Labs (all labs ordered are listed, but only abnormal results are displayed) Labs Reviewed  CBC - Abnormal; Notable for the following components:      Result Value   WBC 12.3 (*)  RBC 3.58 (*)    Hemoglobin 11.6 (*)    HCT 35.8 (*)    All other components within normal limits  BASIC METABOLIC PANEL  TROPONIN I (HIGH SENSITIVITY)     EKG  My interpretation of EKG:  Normal sinus rate of 76 any ST elevation or T wave versions, normal intervals  RADIOLOGY I have reviewed the xray personally and interpreted no evidence of any fractures.  Lungs appear clear no widened mediastinum   PROCEDURES:  Critical Care performed: No  Procedures   MEDICATIONS ORDERED IN ED: Medications - No data to display   IMPRESSION / MDM / ASSESSMENT AND PLAN / ED COURSE  I reviewed the triage vital signs and the nursing notes.   Patient's presentation is most consistent with acute presentation with potential threat to life or bodily function.      INITIAL IMPRESSION / ASSESSMENT AND PLAN / ED COURSE   Most Likely DDx:  -Consider ACS vs MSK  will get EKG/troponin to evaluate for ACS   DDx that was also considered d/t potential to cause harm, but was found less likely based on history and physical (as detailed above): -PNA (no fevers, cough but CXR to  evaluate) -PNX (reassured with equal b/l breath sounds, CXR to evaluate) -Symptomatic anemia (will get H&H) -Pulmonary embolism as no sob at rest, not pleuritic in nature, no hypoxia -Aortic Dissection as no tearing pain and no radiation to the mid back, pulses equal, no murmur -Pericarditis no EKG changes or hx to suggest dx -Tamponade (no notable SOB, tachycardic, hypotensive) -Esophageal rupture (no h/o diffuse vomitting/no crepitus)     At this time given re-assuring workup I have considered CT imaging to rule out PE/Dissection but given history and physical exam these seem less likely and potential harm from CT would outweigh the probability of finding PE or Dissection.  I have a strong suspicion that this is more likely related to musculoskeletal given she reports significant limitation of range of motion of her arm.  She is able to fully lift up her right arm but can only go up halfway and then develops pain.  The chest x-ray did catch the humerus and does not show any signs of fracture, dislocation and she denies any mechanisms to suggest an injury such as that.  We offered to do dedicated x-ray of the shoulder but given the chest x-ray showed the dedicated humeral head patient opted to decline.   Initial troponin was negative.  BMP reassuring CBC slightly elevated white count  Reevaluated patient and she feels better with the lidocaine patches.  Troponins x 2 are negative.  Given patient's exam seems more consistent with musculoskeletal patient feels comfortable with discharge home and will follow-up outpatient with orthopedics.  Her white count was elevated but I reexamined her she had no evidence of warmth of the joint for me to suggest this being a septic joint.  However I did recommend the patient go see orthopedics to be further evaluated.  She expressed understanding and felt comfortable with this plan   Note:  This document was prepared using Dragon voice recognition software and  may include unintentional dictation errors.   The patient is on the cardiac monitor to evaluate for evidence of arrhythmia and/or significant heart rate changes.      FINAL CLINICAL IMPRESSION(S) / ED DIAGNOSES   Final diagnoses:  Atypical chest pain     Rx / DC Orders   ED Discharge Orders  Ordered    lidocaine (LIDODERM) 5 %  Every 12 hours        12/24/23 1249             Note:  This document was prepared using Dragon voice recognition software and may include unintentional dictation errors.   Concha Se, MD 12/24/23 [phone]

## 2024-01-12 ENCOUNTER — Encounter (INDEPENDENT_AMBULATORY_CARE_PROVIDER_SITE_OTHER): Payer: Self-pay | Admitting: Vascular Surgery

## 2024-01-12 ENCOUNTER — Other Ambulatory Visit (INDEPENDENT_AMBULATORY_CARE_PROVIDER_SITE_OTHER): Payer: Self-pay | Admitting: Vascular Surgery

## 2024-01-12 DIAGNOSIS — I739 Peripheral vascular disease, unspecified: Secondary | ICD-10-CM

## 2024-01-12 DIAGNOSIS — M79605 Pain in left leg: Secondary | ICD-10-CM

## 2024-01-12 DIAGNOSIS — Z9889 Other specified postprocedural states: Secondary | ICD-10-CM

## 2024-01-12 NOTE — Telephone Encounter (Signed)
 I spoke with Dr Prescilla Brod and recommended for patient to be scheduled for left arterial duplex and follow up Wauneta Haddock NP preferably tomorrow. If scheduled does not allow tomorrow patient should be scheduled for this week.

## 2024-01-13 ENCOUNTER — Ambulatory Visit (INDEPENDENT_AMBULATORY_CARE_PROVIDER_SITE_OTHER)

## 2024-01-13 ENCOUNTER — Encounter (INDEPENDENT_AMBULATORY_CARE_PROVIDER_SITE_OTHER): Payer: Self-pay | Admitting: Nurse Practitioner

## 2024-01-13 ENCOUNTER — Ambulatory Visit (INDEPENDENT_AMBULATORY_CARE_PROVIDER_SITE_OTHER): Admitting: Nurse Practitioner

## 2024-01-13 DIAGNOSIS — M79605 Pain in left leg: Secondary | ICD-10-CM | POA: Diagnosis not present

## 2024-01-13 DIAGNOSIS — I2510 Atherosclerotic heart disease of native coronary artery without angina pectoris: Secondary | ICD-10-CM | POA: Diagnosis not present

## 2024-01-13 DIAGNOSIS — I70222 Atherosclerosis of native arteries of extremities with rest pain, left leg: Secondary | ICD-10-CM | POA: Diagnosis not present

## 2024-01-13 DIAGNOSIS — E785 Hyperlipidemia, unspecified: Secondary | ICD-10-CM | POA: Diagnosis not present

## 2024-01-13 DIAGNOSIS — I739 Peripheral vascular disease, unspecified: Secondary | ICD-10-CM | POA: Diagnosis not present

## 2024-01-13 DIAGNOSIS — Z9889 Other specified postprocedural states: Secondary | ICD-10-CM

## 2024-01-13 NOTE — H&P (View-Only) (Signed)
 Subjective:    Patient ID: Melanie Browning, female    DOB: 1941/08/16, 83 y.o.   MRN: [id number] No chief complaint on file.   The patient returns to the office for followup regarding atherosclerotic changes of the lower extremities and review of the noninvasive studies.    Procedure 07/16/2023: 1. left common femoral artery to posterior tibial artery bypass with composite in situ saphenous vein with a spliced segment of reversed saphenous vein    She noted that she has recently been having some worsening claudication symptoms which have not been present since her bypass.  The patient has been regularly active and these claudication symptoms have been rather notable for her.  She also notes that she has begun to experience some numbness and pain at night when she lays down.  It resolves when she gets up but this is suggestive of possible breast pain based on her description.  Currently there are no open wounds or ulcerations.  Today's studies show that her bypass graft is occluded just past the region.  She has some diminished monophasic flow in the distal posterior tibial artery.    Review of Systems  Cardiovascular:        Claudication with mild rest pain  Neurological:  Positive for numbness.  All other systems reviewed and are negative.      Objective:   Physical Exam Vitals reviewed.  HENT:     Head: Normocephalic.  Cardiovascular:     Rate and Rhythm: Normal rate.  Pulmonary:     Effort: Pulmonary effort is normal.  Skin:    General: Skin is dry.  Neurological:     Mental Status: She is alert and oriented to person, place, and time.  Psychiatric:        Mood and Affect: Mood normal.        Behavior: Behavior normal.        Thought Content: Thought content normal.        Judgment: Judgment normal.     There were no vitals taken for this visit.  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)    a.) multiple vascular interventions (PTA, stenting, arthrectomy, and thrombectomy) of LEFT SFA, LEFT popliteal, LEFT posterior tibial, and LEFT anterior tibial arteries due to critical limb ischemia   CAD (coronary artery disease) 11/09/2020   a.) cCTA 11/09/2020: Ca2+ 539 (83rd %'ile for age/sex/race match control); <25% dRCA & pLCx, >70% pLAD; b.) LHC/PCI 11/15/2020: 90% pLAD (2.75 x 18 mm Resolute Onyx DES); c.) MV 06/26/2023: no sig ischemia   Cervical spondylosis    Colitis    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)    a.) s/p multiple vascular interventions   Diastolic dysfunction 06/26/2023   a.) TTE 06/26/2023: EF 60-65%, no RWMAs, G2DD, sev AoV thickening with mild AR, mild-mod AS (MPG 18.2 mmHg; AVA 0.73 cm2)   [place]   Dyslipidemia    History of cervical cancer    a.) s/p TAH   HPV (human papilloma virus) infection    Hyperlipidemia    Hypothyroidism    IGT (impaired glucose tolerance)    Left upper lobe pulmonary nodule    Long term current use  of aspirin    Long term current use of ticagrelor therapy    Macrocytic anemia    Osteoporosis    a.) Tx'd with zolodronic acid (Reclast) infusions   PAD (peripheral artery disease) (HCC)    Polymyalgia rheumatica (HCC)    Skin cancer of nose    Transaminitis    Vaginal dysplasia     Social History   Socioeconomic History   Marital status: Married    Spouse name: Norm    Number of children: 1   Years of education: Not on file   Highest education level: Not on file  Occupational History   Occupation: retired   Tobacco Use   Smoking status: Never   Smokeless tobacco: Never  Vaping Use   Vaping status: Never Used  Substance and Sexual Activity   Alcohol use: Yes    Alcohol/week: 1.0 standard drink of  alcohol    Types: 1 Glasses of wine per week   Drug use: Never   Sexual activity: Yes  Other Topics Concern   Not on file  Social History Narrative   Lives with [facility] at [facility]    Social Drivers of Health   Financial Resource Strain: Low Risk  (11/06/2023)   Received from [facility]   Overall Financial Resource Strain (CARDIA)    Difficulty of Paying Living Expenses: Not hard at all  Food Insecurity: No Food Insecurity (11/06/2023)   Received from [facility]   Hunger Vital Sign    Worried About Running Out of Food in the Last Year: Never true    Ran Out of Food in the Last Year: Never true  Transportation Needs: No Transportation Needs (11/06/2023)   Received from [facility] - Transportation    In the past 12 months, has lack of transportation kept you from medical appointments or from getting medications?: No    Lack of Transportation (Non-Medical): No  Physical Activity: Not on file  Stress: Not on file  Social Connections: Not on file  Intimate Partner Violence: Not At Risk (07/18/2023)   Humiliation, Afraid, Rape, and Kick questionnaire    Fear of Current or Ex-Partner: No    Emotionally Abused: No    Physically Abused: No    Sexually Abused: No    Past Surgical History:  Procedure Laterality Date   COLONOSCOPY WITH PROPOFOL N/A 10/04/2020   Procedure: COLONOSCOPY WITH PROPOFOL;  Surgeon: Toledo, Alphonsus Jeans, MD;  Location: [facility] ENDOSCOPY;  Service: Gastroenterology;  Laterality: N/A;   COLPOSCOPY     CORONARY STENT INTERVENTION N/A 11/15/2020   Procedure: CORONARY STENT INTERVENTION;  Surgeon: Antonette Batters, MD;  Location: [facility];  Service: Cardiovascular;  Laterality: N/A;   FEMORAL-POPLITEAL BYPASS GRAFT Left 07/16/2023   Procedure: BYPASS GRAFT FEMORAL-POPLITEAL ARTERY (FEMORAL-POSTERIOR TIBIAL BYPASS W/ SAPHENOUS VEIN- [id number]);  Surgeon: Jackquelyn Mass, MD;  Location: [facility] ORS;   Service: Vascular;  Laterality: Left;   KNEE ARTHROSCOPY Left    LAPAROSCOPIC HYSTERECTOMY     LEFT HEART CATH AND CORONARY ANGIOGRAPHY N/A 11/15/2020   Procedure: LEFT HEART CATH AND CORONARY ANGIOGRAPHY;  Surgeon: Antonette Batters, MD;  Location: [facility];  Service: Cardiovascular;  Laterality: N/A;   LOWER EXTREMITY ANGIOGRAPHY Left 09/05/2020   Procedure: LOWER EXTREMITY ANGIOGRAPHY;  Surgeon: Jackquelyn Mass, MD;  Location: [facility];  Service: Cardiovascular;  Laterality: Left;   LOWER EXTREMITY ANGIOGRAPHY Left 04/10/2021   Procedure: LOWER  EXTREMITY ANGIOGRAPHY;  Surgeon: Renford Dills, MD;  Location: [facility];  Service: Cardiovascular;  Laterality: Left;   LOWER EXTREMITY ANGIOGRAPHY Left 11/20/2021   Procedure: Lower Extremity Angiography;  Surgeon: Renford Dills, MD;  Location: [facility];  Service: Cardiovascular;  Laterality: Left;   LOWER EXTREMITY ANGIOGRAPHY Left 06/05/2022   Procedure: Lower Extremity Angiography;  Surgeon: Renford Dills, MD;  Location: [facility];  Service: Cardiovascular;  Laterality: Left;   LOWER EXTREMITY ANGIOGRAPHY Left 07/23/2022   Procedure: Lower Extremity Angiography;  Surgeon: Renford Dills, MD;  Location: [facility];  Service: Cardiovascular;  Laterality: Left;   LOWER EXTREMITY ANGIOGRAPHY Left 12/27/2022   Procedure: Lower Extremity Angiography;  Surgeon: Renford Dills, MD;  Location: [facility];  Service: Cardiovascular;  Laterality: Left;   LOWER EXTREMITY ANGIOGRAPHY Left 06/17/2023   Procedure: Lower Extremity Angiography;  Surgeon: Renford Dills, MD;  Location: [facility];  Service: Cardiovascular;  Laterality: Left;   TOTAL ABDOMINAL HYSTERECTOMY      Family History  Problem Relation Age of Onset   Cancer Mother    Pancreatic cancer Mother    Heart failure Mother    Heart attack Father 5   Heart disease Father      Allergies  Allergen Reactions   Lactose Other (See Comments) and Nausea And Vomiting    Gi upset and diarrhea  Other reaction(s): Other (See Comments)  Gi upset and diarrhea  Other reaction(s): Other (See Comments) Gi upset and diarrhea Gi upset and diarrhea Gi upset and diarrhea    Gi upset and diarrhea    Gi upset and diarrhea Gi upset and diarrhea Gi upset and diarrhea  Gi upset and diarrhea    Gi upset and diarrhea  Other reaction(s): Other (See Comments) Gi upset and diarrhea Gi upset and diarrhea Gi upset and diarrhea  Gi upset and diarrhea  Gi upset and diarrhea Gi upset and diarrhea Gi upset and diarrhea       Latest Ref Rng & Units 12/24/2023    9:43 AM 07/23/2023    5:07 AM 07/20/2023    5:03 AM  CBC  WBC 4.0 - 10.5 K/uL 12.3  6.5  6.3   Hemoglobin 12.0 - 15.0 g/dL 16.1  8.0  9.1   Hematocrit 36.0 - 46.0 % 35.8  24.6  27.9   Platelets 150 - 400 K/uL 172  248  206       CMP     Component Value Date/Time   NA 136 12/24/2023 0943   K 3.7 12/24/2023 0943   CL 106 12/24/2023 0943   CO2 20 (L) 12/24/2023 0943   GLUCOSE 117 (H) 12/24/2023 0943   BUN 21 12/24/2023 0943   CREATININE 0.80 12/24/2023 0943   CALCIUM 9.0 12/24/2023 0943   PROT 6.2 (L) 07/20/2023 0503   PROT 7.0 06/17/2023 0931   ALBUMIN 3.1 (L) 07/20/2023 0503   ALBUMIN 4.5 06/17/2023 0931   AST 30 07/20/2023 0503   ALT 17 07/20/2023 0503   ALKPHOS 41 07/20/2023 0503   BILITOT 0.9 07/20/2023 0503   BILITOT 0.7 06/17/2023 0931   GFRNONAA >60 12/24/2023 0943     VAS [place] ABI WITH/WO TBI Result Date: 12/01/2023  LOWER EXTREMITY DOPPLER STUDY Patient Name:  Satsuki Zillmer  Date of Exam:   12/01/2023 Medical Rec #: [id number]      Accession #:    [id number] Date of Birth: Nov 17, 1940  Patient Gender: F Patient Age:   38 years Exam Location:  [facility] Procedure:      VAS [place] ABI WITH/WO TBI Referring Phys: --------------------------------------------------------------------------------   Indications: Peripheral artery disease.  Vascular Interventions: 07/16/2023                         left common femoral artery to posterior tibial artery                         bypass with composite in situ saphenous vein with a                         spliced segment of reversed saphenous vein.                          12/27/2022 Percutaneous transluminal angioplasty and                         stent placement left superficial femoral and popliteal                         arteries to 6 mm                         4. Percutaneous transluminal angioplasty left posterior                         tibial and tibioperoneal trunk to 3.5 mm                         5. Atherectomy of the superficial femoral artery and                         popliteal artery using the Rota Rex catheter.                         6. Mechanical thrombectomy of the tibioperoneal trunk                         using the penumbra CAT 6 catheter.                         10/13/2019: Left popliteal artery PTA;                         10/14/2019: Left popliteal artery reconstruction;                         02/03/2020: Left popliteal artery angioplasty;                         04/10/2021: Left distal SFA/Popliteal Artery                         thrombectomy/PTA/stent;                          11/20/2021: PTA and Stent placement left SFA and  Popliteal Artery. PTA Left tibioperoneal trunk and                         Posterior Tibial Artery.                         06/05/2022 Rota Rex mechanical thrombectomy of the left                         SFA and popliteal                         3. Percutaneous transluminal angioplasty and stent                         placement left superficial femoral and popliteal                         arteries to 5 mm maximally                         4. Percutaneous transluminal angioplasty left posterior                         tibial to approximately 3.5 mm. Performing Technologist: Salvadore Farber RVT   Examination Guidelines: A complete evaluation includes at minimum, Doppler waveform signals and systolic blood pressure reading at the level of bilateral brachial, anterior tibial, and posterior tibial arteries, when vessel segments are accessible. Bilateral testing is considered an integral part of a complete examination. Photoelectric Plethysmograph (PPG) waveforms and toe systolic pressure readings are included as required and additional duplex testing as needed. Limited examinations for reoccurring indications may be performed as noted.  ABI Findings: +---------+------------------+-----+---------+--------+ Right    Rt Pressure (mmHg)IndexWaveform Comment  +---------+------------------+-----+---------+--------+ Brachial 109                                      +---------+------------------+-----+---------+--------+ PTA      120               1.10 triphasic         +---------+------------------+-----+---------+--------+ DP       121               1.11 triphasic         +---------+------------------+-----+---------+--------+ Great Toe109               1.00 Normal            +---------+------------------+-----+---------+--------+ +---------+------------------+-----+--------+-------+ Left     Lt Pressure (mmHg)IndexWaveformComment +---------+------------------+-----+--------+-------+ Brachial 105                                    +---------+------------------+-----+--------+-------+ PTA      107               0.98 biphasic        +---------+------------------+-----+--------+-------+ DP       97                0.89 biphasic        +---------+------------------+-----+--------+-------+ Great Toe100               0.92 Abnormal        +---------+------------------+-----+--------+-------+ +-------+-----------+-----------+------------+------------+  ABI/TBIToday's ABIToday's TBIPrevious ABIPrevious TBI  +-------+-----------+-----------+------------+------------+ Right  1.11       1.00       1.11        .91          +-------+-----------+-----------+------------+------------+ Left   .98        .92        1.03        .93          +-------+-----------+-----------+------------+------------+ Bilateral ABIs and TBIs appear essentially unchanged compared to prior study on 10/2023.  Summary: Right: Resting right ankle-brachial index is within normal range. The right toe-brachial index is normal. Left: Resting left ankle-brachial index is within normal range. The left toe-brachial index is normal. *See table(s) above for measurements and observations.  Electronically signed by [name] MD on 12/01/2023 at 4:28:38 PM.    Final        Assessment & Plan:   1. Atherosclerosis of native artery of left lower extremity with rest pain (HCC) (Primary) Recommend:  The patient has evidence of severe atherosclerotic changes of both lower extremities with rest pain that is associated with preulcerative changes and impending tissue loss of the left foot.  This represents a limb threatening ischemia and places the patient at the risk for  limb loss.  Patient should undergo angiography of the left lower extremity with the hope for intervention for limb salvage.  The risks and benefits as well as the alternative therapies was discussed in detail with the patient.  All questions were answered.  Patient agrees to proceed with left lower extremity angiography.  The patient will follow up with me in the office after the procedure.      2. Dyslipidemia Continue statin as ordered and reviewed, no changes at this time  3. 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   Continue statin as ordered and reviewed, no changes at this time   Nitrates PRN for  chest pain    Current Outpatient Medications on File Prior to Visit  Medication Sig Dispense Refill   aspirin EC 81 MG tablet Take 81 mg by mouth every evening. Swallow whole.     B Complex Vitamins (VITAMIN B-COMPLEX) TABS Take 1 tablet by mouth daily.     budesonide (ENTOCORT EC) 3 MG 24 hr capsule Take 9 mg by mouth daily.     budesonide (ENTOCORT EC) 3 MG 24 hr capsule Take 3 capsules (9 mg total) by mouth every morning. 270 capsule 2   diphenoxylate-atropine (LOMOTIL) 2.5-0.025 MG tablet TAKE 1 TABLET BY MOUTH 3 TIMES DAILY AS NEEDED FOR DIARRHEA. 90 tablet 0   estradiol (ESTRACE) 0.1 MG/GM vaginal cream Insert 1/4 applicator full into vagina at night twice a week 42.5 g 2   HYDROcodone-acetaminophen (NORCO) 10-325 MG tablet Take 1 tablet by mouth every 6 (six) hours as needed for severe pain (pain score 7-10). 40 tablet 0   levothyroxine (SYNTHROID) 75 MCG tablet Take 75 mcg by mouth daily before breakfast.      losartan (COZAAR) 25 MG tablet Take 25 mg by mouth daily.     nitroGLYCERIN (NITROSTAT) 0.4 MG SL tablet Place under the tongue. (Patient not taking: Reported on 07/16/2023)     rosuvastatin (CRESTOR) 5 MG tablet Take 5 mg by mouth daily.     ticagrelor (BRILINTA) 90 MG TABS tablet TAKE ONE TABLET BY MOUTH  TWICE DAILY 180 tablet 0   No current facility-administered medications on file prior to visit.    There are no Patient Instructions on file for this visit. No follow-ups on file.   Uldine Fuster E Orelia Brandstetter, NP

## 2024-01-13 NOTE — Progress Notes (Signed)
 Subjective:    Patient ID: Melanie Browning, female    DOB: 1941/08/16, 83 y.o.   MRN: [id number] No chief complaint on file.   The patient returns to the office for followup regarding atherosclerotic changes of the lower extremities and review of the noninvasive studies.    Procedure 07/16/2023: 1. left common femoral artery to posterior tibial artery bypass with composite in situ saphenous vein with a spliced segment of reversed saphenous vein    She noted that she has recently been having some worsening claudication symptoms which have not been present since her bypass.  The patient has been regularly active and these claudication symptoms have been rather notable for her.  She also notes that she has begun to experience some numbness and pain at night when she lays down.  It resolves when she gets up but this is suggestive of possible breast pain based on her description.  Currently there are no open wounds or ulcerations.  Today's studies show that her bypass graft is occluded just past the region.  She has some diminished monophasic flow in the distal posterior tibial artery.    Review of Systems  Cardiovascular:        Claudication with mild rest pain  Neurological:  Positive for numbness.  All other systems reviewed and are negative.      Objective:   Physical Exam Vitals reviewed.  HENT:     Head: Normocephalic.  Cardiovascular:     Rate and Rhythm: Normal rate.  Pulmonary:     Effort: Pulmonary effort is normal.  Skin:    General: Skin is dry.  Neurological:     Mental Status: She is alert and oriented to person, place, and time.  Psychiatric:        Mood and Affect: Mood normal.        Behavior: Behavior normal.        Thought Content: Thought content normal.        Judgment: Judgment normal.     There were no vitals taken for this visit.  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)    a.) multiple vascular interventions (PTA, stenting, arthrectomy, and thrombectomy) of LEFT SFA, LEFT popliteal, LEFT posterior tibial, and LEFT anterior tibial arteries due to critical limb ischemia   CAD (coronary artery disease) 11/09/2020   a.) cCTA 11/09/2020: Ca2+ 539 (83rd %'ile for age/sex/race match control); <25% dRCA & pLCx, >70% pLAD; b.) LHC/PCI 11/15/2020: 90% pLAD (2.75 x 18 mm Resolute Onyx DES); c.) MV 06/26/2023: no sig ischemia   Cervical spondylosis    Colitis    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)    a.) s/p multiple vascular interventions   Diastolic dysfunction 06/26/2023   a.) TTE 06/26/2023: EF 60-65%, no RWMAs, G2DD, sev AoV thickening with mild AR, mild-mod AS (MPG 18.2 mmHg; AVA 0.73 cm2)   [place]   Dyslipidemia    History of cervical cancer    a.) s/p TAH   HPV (human papilloma virus) infection    Hyperlipidemia    Hypothyroidism    IGT (impaired glucose tolerance)    Left upper lobe pulmonary nodule    Long term current use  of aspirin    Long term current use of ticagrelor therapy    Macrocytic anemia    Osteoporosis    a.) Tx'd with zolodronic acid (Reclast) infusions   PAD (peripheral artery disease) (HCC)    Polymyalgia rheumatica (HCC)    Skin cancer of nose    Transaminitis    Vaginal dysplasia     Social History   Socioeconomic History   Marital status: Married    Spouse name: Norm    Number of children: 1   Years of education: Not on file   Highest education level: Not on file  Occupational History   Occupation: retired   Tobacco Use   Smoking status: Never   Smokeless tobacco: Never  Vaping Use   Vaping status: Never Used  Substance and Sexual Activity   Alcohol use: Yes    Alcohol/week: 1.0 standard drink of  alcohol    Types: 1 Glasses of wine per week   Drug use: Never   Sexual activity: Yes  Other Topics Concern   Not on file  Social History Narrative   Lives with [facility] at [facility]    Social Drivers of Health   Financial Resource Strain: Low Risk  (11/06/2023)   Received from [facility]   Overall Financial Resource Strain (CARDIA)    Difficulty of Paying Living Expenses: Not hard at all  Food Insecurity: No Food Insecurity (11/06/2023)   Received from [facility]   Hunger Vital Sign    Worried About Running Out of Food in the Last Year: Never true    Ran Out of Food in the Last Year: Never true  Transportation Needs: No Transportation Needs (11/06/2023)   Received from [facility] - Transportation    In the past 12 months, has lack of transportation kept you from medical appointments or from getting medications?: No    Lack of Transportation (Non-Medical): No  Physical Activity: Not on file  Stress: Not on file  Social Connections: Not on file  Intimate Partner Violence: Not At Risk (07/18/2023)   Humiliation, Afraid, Rape, and Kick questionnaire    Fear of Current or Ex-Partner: No    Emotionally Abused: No    Physically Abused: No    Sexually Abused: No    Past Surgical History:  Procedure Laterality Date   COLONOSCOPY WITH PROPOFOL N/A 10/04/2020   Procedure: COLONOSCOPY WITH PROPOFOL;  Surgeon: Toledo, Alphonsus Jeans, MD;  Location: [facility] ENDOSCOPY;  Service: Gastroenterology;  Laterality: N/A;   COLPOSCOPY     CORONARY STENT INTERVENTION N/A 11/15/2020   Procedure: CORONARY STENT INTERVENTION;  Surgeon: Antonette Batters, MD;  Location: [facility] INVASIVE CV LAB;  Service: Cardiovascular;  Laterality: N/A;   FEMORAL-POPLITEAL BYPASS GRAFT Left 07/16/2023   Procedure: BYPASS GRAFT FEMORAL-POPLITEAL ARTERY (FEMORAL-POSTERIOR TIBIAL BYPASS W/ SAPHENOUS VEIN- [id number]);  Surgeon: Jackquelyn Mass, MD;  Location: [facility] ORS;   Service: Vascular;  Laterality: Left;   KNEE ARTHROSCOPY Left    LAPAROSCOPIC HYSTERECTOMY     LEFT HEART CATH AND CORONARY ANGIOGRAPHY N/A 11/15/2020   Procedure: LEFT HEART CATH AND CORONARY ANGIOGRAPHY;  Surgeon: Antonette Batters, MD;  Location: [facility];  Service: Cardiovascular;  Laterality: N/A;   LOWER EXTREMITY ANGIOGRAPHY Left 09/05/2020   Procedure: LOWER EXTREMITY ANGIOGRAPHY;  Surgeon: Jackquelyn Mass, MD;  Location: [facility];  Service: Cardiovascular;  Laterality: Left;   LOWER EXTREMITY ANGIOGRAPHY Left 04/10/2021   Procedure: LOWER  EXTREMITY ANGIOGRAPHY;  Surgeon: Renford Dills, MD;  Location: [facility];  Service: Cardiovascular;  Laterality: Left;   LOWER EXTREMITY ANGIOGRAPHY Left 11/20/2021   Procedure: Lower Extremity Angiography;  Surgeon: Renford Dills, MD;  Location: [facility];  Service: Cardiovascular;  Laterality: Left;   LOWER EXTREMITY ANGIOGRAPHY Left 06/05/2022   Procedure: Lower Extremity Angiography;  Surgeon: Renford Dills, MD;  Location: [facility];  Service: Cardiovascular;  Laterality: Left;   LOWER EXTREMITY ANGIOGRAPHY Left 07/23/2022   Procedure: Lower Extremity Angiography;  Surgeon: Renford Dills, MD;  Location: [facility];  Service: Cardiovascular;  Laterality: Left;   LOWER EXTREMITY ANGIOGRAPHY Left 12/27/2022   Procedure: Lower Extremity Angiography;  Surgeon: Renford Dills, MD;  Location: [facility];  Service: Cardiovascular;  Laterality: Left;   LOWER EXTREMITY ANGIOGRAPHY Left 06/17/2023   Procedure: Lower Extremity Angiography;  Surgeon: Renford Dills, MD;  Location: [facility];  Service: Cardiovascular;  Laterality: Left;   TOTAL ABDOMINAL HYSTERECTOMY      Family History  Problem Relation Age of Onset   Cancer Mother    Pancreatic cancer Mother    Heart failure Mother    Heart attack Father 5   Heart disease Father      Allergies  Allergen Reactions   Lactose Other (See Comments) and Nausea And Vomiting    Gi upset and diarrhea  Other reaction(s): Other (See Comments)  Gi upset and diarrhea  Other reaction(s): Other (See Comments) Gi upset and diarrhea Gi upset and diarrhea Gi upset and diarrhea    Gi upset and diarrhea    Gi upset and diarrhea Gi upset and diarrhea Gi upset and diarrhea  Gi upset and diarrhea    Gi upset and diarrhea  Other reaction(s): Other (See Comments) Gi upset and diarrhea Gi upset and diarrhea Gi upset and diarrhea  Gi upset and diarrhea  Gi upset and diarrhea Gi upset and diarrhea Gi upset and diarrhea       Latest Ref Rng & Units 12/24/2023    9:43 AM 07/23/2023    5:07 AM 07/20/2023    5:03 AM  CBC  WBC 4.0 - 10.5 K/uL 12.3  6.5  6.3   Hemoglobin 12.0 - 15.0 g/dL 16.1  8.0  9.1   Hematocrit 36.0 - 46.0 % 35.8  24.6  27.9   Platelets 150 - 400 K/uL 172  248  206       CMP     Component Value Date/Time   NA 136 12/24/2023 0943   K 3.7 12/24/2023 0943   CL 106 12/24/2023 0943   CO2 20 (L) 12/24/2023 0943   GLUCOSE 117 (H) 12/24/2023 0943   BUN 21 12/24/2023 0943   CREATININE 0.80 12/24/2023 0943   CALCIUM 9.0 12/24/2023 0943   PROT 6.2 (L) 07/20/2023 0503   PROT 7.0 06/17/2023 0931   ALBUMIN 3.1 (L) 07/20/2023 0503   ALBUMIN 4.5 06/17/2023 0931   AST 30 07/20/2023 0503   ALT 17 07/20/2023 0503   ALKPHOS 41 07/20/2023 0503   BILITOT 0.9 07/20/2023 0503   BILITOT 0.7 06/17/2023 0931   GFRNONAA >60 12/24/2023 0943     VAS [place] ABI WITH/WO TBI Result Date: 12/01/2023  LOWER EXTREMITY DOPPLER STUDY Patient Name:  Satsuki Zillmer  Date of Exam:   12/01/2023 Medical Rec #: [id number]      Accession #:    [id number] Date of Birth: Nov 17, 1940  Patient Gender: F Patient Age:   38 years Exam Location:  [facility] Procedure:      VAS [place] ABI WITH/WO TBI Referring Phys: --------------------------------------------------------------------------------   Indications: Peripheral artery disease.  Vascular Interventions: 07/16/2023                         left common femoral artery to posterior tibial artery                         bypass with composite in situ saphenous vein with a                         spliced segment of reversed saphenous vein.                          12/27/2022 Percutaneous transluminal angioplasty and                         stent placement left superficial femoral and popliteal                         arteries to 6 mm                         4. Percutaneous transluminal angioplasty left posterior                         tibial and tibioperoneal trunk to 3.5 mm                         5. Atherectomy of the superficial femoral artery and                         popliteal artery using the Rota Rex catheter.                         6. Mechanical thrombectomy of the tibioperoneal trunk                         using the penumbra CAT 6 catheter.                         10/13/2019: Left popliteal artery PTA;                         10/14/2019: Left popliteal artery reconstruction;                         02/03/2020: Left popliteal artery angioplasty;                         04/10/2021: Left distal SFA/Popliteal Artery                         thrombectomy/PTA/stent;                          11/20/2021: PTA and Stent placement left SFA and  Popliteal Artery. PTA Left tibioperoneal trunk and                         Posterior Tibial Artery.                         06/05/2022 Rota Rex mechanical thrombectomy of the left                         SFA and popliteal                         3. Percutaneous transluminal angioplasty and stent                         placement left superficial femoral and popliteal                         arteries to 5 mm maximally                         4. Percutaneous transluminal angioplasty left posterior                         tibial to approximately 3.5 mm. Performing Technologist: Salvadore Farber RVT   Examination Guidelines: A complete evaluation includes at minimum, Doppler waveform signals and systolic blood pressure reading at the level of bilateral brachial, anterior tibial, and posterior tibial arteries, when vessel segments are accessible. Bilateral testing is considered an integral part of a complete examination. Photoelectric Plethysmograph (PPG) waveforms and toe systolic pressure readings are included as required and additional duplex testing as needed. Limited examinations for reoccurring indications may be performed as noted.  ABI Findings: +---------+------------------+-----+---------+--------+ Right    Rt Pressure (mmHg)IndexWaveform Comment  +---------+------------------+-----+---------+--------+ Brachial 109                                      +---------+------------------+-----+---------+--------+ PTA      120               1.10 triphasic         +---------+------------------+-----+---------+--------+ DP       121               1.11 triphasic         +---------+------------------+-----+---------+--------+ Great Toe109               1.00 Normal            +---------+------------------+-----+---------+--------+ +---------+------------------+-----+--------+-------+ Left     Lt Pressure (mmHg)IndexWaveformComment +---------+------------------+-----+--------+-------+ Brachial 105                                    +---------+------------------+-----+--------+-------+ PTA      107               0.98 biphasic        +---------+------------------+-----+--------+-------+ DP       97                0.89 biphasic        +---------+------------------+-----+--------+-------+ Great Toe100               0.92 Abnormal        +---------+------------------+-----+--------+-------+ +-------+-----------+-----------+------------+------------+  ABI/TBIToday's ABIToday's TBIPrevious ABIPrevious TBI  +-------+-----------+-----------+------------+------------+ Right  1.11       1.00       1.11        .91          +-------+-----------+-----------+------------+------------+ Left   .98        .92        1.03        .93          +-------+-----------+-----------+------------+------------+ Bilateral ABIs and TBIs appear essentially unchanged compared to prior study on 10/2023.  Summary: Right: Resting right ankle-brachial index is within normal range. The right toe-brachial index is normal. Left: Resting left ankle-brachial index is within normal range. The left toe-brachial index is normal. *See table(s) above for measurements and observations.  Electronically signed by [name] MD on 12/01/2023 at 4:28:38 PM.    Final        Assessment & Plan:   1. Atherosclerosis of native artery of left lower extremity with rest pain (HCC) (Primary) Recommend:  The patient has evidence of severe atherosclerotic changes of both lower extremities with rest pain that is associated with preulcerative changes and impending tissue loss of the left foot.  This represents a limb threatening ischemia and places the patient at the risk for  limb loss.  Patient should undergo angiography of the left lower extremity with the hope for intervention for limb salvage.  The risks and benefits as well as the alternative therapies was discussed in detail with the patient.  All questions were answered.  Patient agrees to proceed with left lower extremity angiography.  The patient will follow up with me in the office after the procedure.      2. Dyslipidemia Continue statin as ordered and reviewed, no changes at this time  3. 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   Continue statin as ordered and reviewed, no changes at this time   Nitrates PRN for  chest pain    Current Outpatient Medications on File Prior to Visit  Medication Sig Dispense Refill   aspirin EC 81 MG tablet Take 81 mg by mouth every evening. Swallow whole.     B Complex Vitamins (VITAMIN B-COMPLEX) TABS Take 1 tablet by mouth daily.     budesonide (ENTOCORT EC) 3 MG 24 hr capsule Take 9 mg by mouth daily.     budesonide (ENTOCORT EC) 3 MG 24 hr capsule Take 3 capsules (9 mg total) by mouth every morning. 270 capsule 2   diphenoxylate-atropine (LOMOTIL) 2.5-0.025 MG tablet TAKE 1 TABLET BY MOUTH 3 TIMES DAILY AS NEEDED FOR DIARRHEA. 90 tablet 0   estradiol (ESTRACE) 0.1 MG/GM vaginal cream Insert 1/4 applicator full into vagina at night twice a week 42.5 g 2   HYDROcodone-acetaminophen (NORCO) 10-325 MG tablet Take 1 tablet by mouth every 6 (six) hours as needed for severe pain (pain score 7-10). 40 tablet 0   levothyroxine (SYNTHROID) 75 MCG tablet Take 75 mcg by mouth daily before breakfast.      losartan (COZAAR) 25 MG tablet Take 25 mg by mouth daily.     nitroGLYCERIN (NITROSTAT) 0.4 MG SL tablet Place under the tongue. (Patient not taking: Reported on 07/16/2023)     rosuvastatin (CRESTOR) 5 MG tablet Take 5 mg by mouth daily.     ticagrelor (BRILINTA) 90 MG TABS tablet TAKE ONE TABLET BY MOUTH  TWICE DAILY 180 tablet 0   No current facility-administered medications on file prior to visit.    There are no Patient Instructions on file for this visit. No follow-ups on file.   Uldine Fuster E Orelia Brandstetter, NP

## 2024-01-14 ENCOUNTER — Telehealth (INDEPENDENT_AMBULATORY_CARE_PROVIDER_SITE_OTHER): Payer: Self-pay

## 2024-01-14 ENCOUNTER — Ambulatory Visit: Admitting: Nurse Practitioner

## 2024-01-14 NOTE — Telephone Encounter (Signed)
 Spoke with the patient and she is scheduled with Dr. Prescilla Brod on 01/20/24 with a 2:00 pm arrival time to the [facility] for a  LLE angio. Pre-procedure instructions were discussed and will be sent to Mychart and mailed.

## 2024-01-20 ENCOUNTER — Inpatient Hospital Stay
Admission: RE | Admit: 2024-01-20 | Discharge: 2024-02-02 | DRG: 270 | Disposition: A | Discharge status: Home Health | Source: Ambulatory Visit | Attending: Vascular Surgery | Admitting: Vascular Surgery

## 2024-01-20 ENCOUNTER — Other Ambulatory Visit: Payer: Self-pay

## 2024-01-20 ENCOUNTER — Encounter
Admission: RE | Disposition: A | Discharge status: Home Health | Payer: Self-pay | Source: Ambulatory Visit | Attending: Vascular Surgery

## 2024-01-20 ENCOUNTER — Encounter: Payer: Self-pay | Admitting: Vascular Surgery

## 2024-01-20 DIAGNOSIS — T82868A Thrombosis of vascular prosthetic devices, implants and grafts, initial encounter: Secondary | ICD-10-CM

## 2024-01-20 DIAGNOSIS — E871 Hypo-osmolality and hyponatremia: Secondary | ICD-10-CM | POA: Diagnosis not present

## 2024-01-20 DIAGNOSIS — E785 Hyperlipidemia, unspecified: Secondary | ICD-10-CM | POA: Diagnosis present

## 2024-01-20 DIAGNOSIS — Z95828 Presence of other vascular implants and grafts: Secondary | ICD-10-CM | POA: Diagnosis not present

## 2024-01-20 DIAGNOSIS — Z9889 Other specified postprocedural states: Secondary | ICD-10-CM | POA: Diagnosis not present

## 2024-01-20 DIAGNOSIS — Z8249 Family history of ischemic heart disease and other diseases of the circulatory system: Secondary | ICD-10-CM

## 2024-01-20 DIAGNOSIS — Z91011 Allergy to milk products: Secondary | ICD-10-CM

## 2024-01-20 DIAGNOSIS — Z7982 Long term (current) use of aspirin: Secondary | ICD-10-CM

## 2024-01-20 DIAGNOSIS — I160 Hypertensive urgency: Secondary | ICD-10-CM | POA: Diagnosis not present

## 2024-01-20 DIAGNOSIS — Z800 Family history of malignant neoplasm of digestive organs: Secondary | ICD-10-CM

## 2024-01-20 DIAGNOSIS — I350 Nonrheumatic aortic (valve) stenosis: Secondary | ICD-10-CM | POA: Diagnosis present

## 2024-01-20 DIAGNOSIS — T82856A Stenosis of peripheral vascular stent, initial encounter: Secondary | ICD-10-CM

## 2024-01-20 DIAGNOSIS — K567 Ileus, unspecified: Secondary | ICD-10-CM | POA: Diagnosis not present

## 2024-01-20 DIAGNOSIS — Z955 Presence of coronary angioplasty implant and graft: Secondary | ICD-10-CM

## 2024-01-20 DIAGNOSIS — I743 Embolism and thrombosis of arteries of the lower extremities: Secondary | ICD-10-CM

## 2024-01-20 DIAGNOSIS — I70222 Atherosclerosis of native arteries of extremities with rest pain, left leg: Secondary | ICD-10-CM | POA: Diagnosis present

## 2024-01-20 DIAGNOSIS — E782 Mixed hyperlipidemia: Secondary | ICD-10-CM | POA: Diagnosis not present

## 2024-01-20 DIAGNOSIS — E876 Hypokalemia: Secondary | ICD-10-CM | POA: Diagnosis present

## 2024-01-20 DIAGNOSIS — K56609 Unspecified intestinal obstruction, unspecified as to partial versus complete obstruction: Secondary | ICD-10-CM

## 2024-01-20 DIAGNOSIS — I2510 Atherosclerotic heart disease of native coronary artery without angina pectoris: Secondary | ICD-10-CM | POA: Diagnosis present

## 2024-01-20 DIAGNOSIS — M353 Polymyalgia rheumatica: Secondary | ICD-10-CM | POA: Diagnosis present

## 2024-01-20 DIAGNOSIS — I214 Non-ST elevation (NSTEMI) myocardial infarction: Secondary | ICD-10-CM | POA: Diagnosis not present

## 2024-01-20 DIAGNOSIS — I70229 Atherosclerosis of native arteries of extremities with rest pain, unspecified extremity: Secondary | ICD-10-CM | POA: Diagnosis not present

## 2024-01-20 DIAGNOSIS — Z90710 Acquired absence of both cervix and uterus: Secondary | ICD-10-CM

## 2024-01-20 DIAGNOSIS — Z8541 Personal history of malignant neoplasm of cervix uteri: Secondary | ICD-10-CM

## 2024-01-20 DIAGNOSIS — Y832 Surgical operation with anastomosis, bypass or graft as the cause of abnormal reaction of the patient, or of later complication, without mention of misadventure at the time of the procedure: Secondary | ICD-10-CM | POA: Diagnosis not present

## 2024-01-20 DIAGNOSIS — I110 Hypertensive heart disease with heart failure: Secondary | ICD-10-CM | POA: Diagnosis present

## 2024-01-20 DIAGNOSIS — I4891 Unspecified atrial fibrillation: Secondary | ICD-10-CM | POA: Diagnosis not present

## 2024-01-20 DIAGNOSIS — D62 Acute posthemorrhagic anemia: Secondary | ICD-10-CM | POA: Diagnosis not present

## 2024-01-20 DIAGNOSIS — R339 Retention of urine, unspecified: Secondary | ICD-10-CM | POA: Diagnosis not present

## 2024-01-20 DIAGNOSIS — T82898A Other specified complication of vascular prosthetic devices, implants and grafts, initial encounter: Secondary | ICD-10-CM | POA: Diagnosis not present

## 2024-01-20 DIAGNOSIS — I5032 Chronic diastolic (congestive) heart failure: Secondary | ICD-10-CM | POA: Diagnosis present

## 2024-01-20 DIAGNOSIS — Z66 Do not resuscitate: Secondary | ICD-10-CM | POA: Diagnosis present

## 2024-01-20 DIAGNOSIS — F419 Anxiety disorder, unspecified: Secondary | ICD-10-CM | POA: Diagnosis present

## 2024-01-20 DIAGNOSIS — I959 Hypotension, unspecified: Secondary | ICD-10-CM | POA: Diagnosis not present

## 2024-01-20 DIAGNOSIS — E039 Hypothyroidism, unspecified: Secondary | ICD-10-CM | POA: Diagnosis present

## 2024-01-20 DIAGNOSIS — Z7902 Long term (current) use of antithrombotics/antiplatelets: Secondary | ICD-10-CM

## 2024-01-20 DIAGNOSIS — Z5320 Procedure and treatment not carried out because of patient's decision for unspecified reasons: Secondary | ICD-10-CM | POA: Diagnosis present

## 2024-01-20 DIAGNOSIS — Z7901 Long term (current) use of anticoagulants: Secondary | ICD-10-CM

## 2024-01-20 DIAGNOSIS — L7632 Postprocedural hematoma of skin and subcutaneous tissue following other procedure: Secondary | ICD-10-CM | POA: Diagnosis not present

## 2024-01-20 DIAGNOSIS — D500 Iron deficiency anemia secondary to blood loss (chronic): Secondary | ICD-10-CM

## 2024-01-20 DIAGNOSIS — Z79890 Hormone replacement therapy (postmenopausal): Secondary | ICD-10-CM

## 2024-01-20 DIAGNOSIS — E872 Acidosis, unspecified: Secondary | ICD-10-CM | POA: Diagnosis present

## 2024-01-20 DIAGNOSIS — M79605 Pain in left leg: Secondary | ICD-10-CM | POA: Diagnosis not present

## 2024-01-20 DIAGNOSIS — M810 Age-related osteoporosis without current pathological fracture: Secondary | ICD-10-CM | POA: Diagnosis present

## 2024-01-20 DIAGNOSIS — Z79899 Other long term (current) drug therapy: Secondary | ICD-10-CM

## 2024-01-20 DIAGNOSIS — I480 Paroxysmal atrial fibrillation: Secondary | ICD-10-CM | POA: Diagnosis present

## 2024-01-20 DIAGNOSIS — I25118 Atherosclerotic heart disease of native coronary artery with other forms of angina pectoris: Secondary | ICD-10-CM | POA: Diagnosis not present

## 2024-01-20 DIAGNOSIS — Z85828 Personal history of other malignant neoplasm of skin: Secondary | ICD-10-CM

## 2024-01-20 DIAGNOSIS — R7989 Other specified abnormal findings of blood chemistry: Secondary | ICD-10-CM

## 2024-01-20 HISTORY — PX: LOWER EXTREMITY INTERVENTION: CATH118252

## 2024-01-20 HISTORY — PX: LOWER EXTREMITY ANGIOGRAPHY: CATH118251

## 2024-01-20 LAB — CREATININE, SERUM
Creatinine, Ser: 0.71 mg/dL (ref 0.44–1.00)
GFR, Estimated: >60 mL/min (ref >60)

## 2024-01-20 LAB — COMPREHENSIVE METABOLIC PANEL WITH GFR
ALT: 23 U/L (ref 0–44)
AST: 34 U/L (ref 15–41)
Albumin: 3.5 g/dL (ref 3.5–5.0)
Alkaline Phosphatase: 44 U/L (ref 38–126)
Anion gap: 11 (ref 5–15)
BUN: 21 mg/dL (ref 8–23)
CO2: 19 mmol/L — ABNORMAL LOW (ref 22–32)
Calcium: 8.7 mg/dL — ABNORMAL LOW (ref 8.9–10.3)
Chloride: 107 mmol/L (ref 98–111)
Creatinine, Ser: 0.52 mg/dL (ref 0.44–1.00)
GFR, Estimated: >60 mL/min (ref >60)
Glucose, Bld: 109 mg/dL — ABNORMAL HIGH (ref 70–99)
Potassium: 3.2 mmol/L — ABNORMAL LOW (ref 3.5–5.1)
Sodium: 137 mmol/L (ref 135–145)
Total Bilirubin: 1.1 mg/dL (ref 0.0–1.2)
Total Protein: 6.5 g/dL (ref 6.5–8.1)

## 2024-01-20 LAB — CBC
HCT: 34.5 % — ABNORMAL LOW (ref 36.0–46.0)
Hemoglobin: 11.7 g/dL — ABNORMAL LOW (ref 12.0–15.0)
MCH: 33.2 pg (ref 26.0–34.0)
MCHC: 33.9 g/dL (ref 30.0–36.0)
MCV: 98 fL (ref 80.0–100.0)
Platelets: 166 10*3/uL (ref 150–400)
RBC: 3.52 MIL/uL — ABNORMAL LOW (ref 3.87–5.11)
RDW: 14.4 % (ref 11.5–15.5)
WBC: 6.2 10*3/uL (ref 4.0–10.5)
nRBC: 0 % (ref 0.0–0.2)

## 2024-01-20 LAB — FIBRINOGEN: Fibrinogen: 328 mg/dL (ref 210–475)

## 2024-01-20 LAB — HEPARIN LEVEL (UNFRACTIONATED): Heparin Unfractionated: 0.41 [IU]/mL (ref 0.30–0.70)

## 2024-01-20 LAB — PHOSPHORUS: Phosphorus: 3 mg/dL (ref 2.5–4.6)

## 2024-01-20 LAB — GLUCOSE, CAPILLARY: Glucose-Capillary: 77 mg/dL (ref 70–99)

## 2024-01-20 LAB — MAGNESIUM: Magnesium: 1.5 mg/dL — ABNORMAL LOW (ref 1.7–2.4)

## 2024-01-20 LAB — BUN: BUN: 23 mg/dL (ref 8–23)

## 2024-01-20 SURGERY — LOWER EXTREMITY ANGIOGRAPHY
Anesthesia: Moderate Sedation | Site: Leg Lower | Laterality: Left

## 2024-01-20 MED ORDER — MIDAZOLAM HCL 2 MG/2ML IJ SOLN: 1.0000 mg | INTRAMUSCULAR | Status: DC | PRN

## 2024-01-20 MED ORDER — POTASSIUM CHLORIDE 10 MEQ/100ML IV SOLN
10.0000 meq | INTRAVENOUS | Status: AC
  Administered 2024-01-21 (×6): 10 meq via INTRAVENOUS
  Filled 2024-01-20 (×6): qty 100

## 2024-01-20 MED ORDER — FENTANYL CITRATE (PF) 100 MCG/2ML IJ SOLN
INTRAMUSCULAR | Status: AC
  Filled 2024-01-20: qty 2

## 2024-01-20 MED ORDER — HYDROMORPHONE HCL 1 MG/ML IJ SOLN
INTRAMUSCULAR | Status: AC
  Filled 2024-01-20: qty 1

## 2024-01-20 MED ORDER — LOSARTAN POTASSIUM 50 MG PO TABS
25.0000 mg | ORAL_TABLET | Freq: Every day | ORAL | Status: DC
  Filled 2024-01-20 (×2): qty 1

## 2024-01-20 MED ORDER — HEPARIN SODIUM (PORCINE) 1000 UNIT/ML IJ SOLN
INTRAMUSCULAR | Status: DC | PRN
  Administered 2024-01-20: 2000 [IU] via INTRAVENOUS
  Administered 2024-01-20: 6000 [IU] via INTRAVENOUS

## 2024-01-20 MED ORDER — NITROGLYCERIN 0.4 MG SL SUBL: 0.4000 mg | SUBLINGUAL_TABLET | SUBLINGUAL | Status: DC | PRN

## 2024-01-20 MED ORDER — CEFAZOLIN SODIUM-DEXTROSE 2-4 GM/100ML-% IV SOLN
INTRAVENOUS | Status: AC
  Filled 2024-01-20: qty 100

## 2024-01-20 MED ORDER — MIDAZOLAM HCL 2 MG/2ML IJ SOLN
INTRAMUSCULAR | Status: AC
  Filled 2024-01-20: qty 2

## 2024-01-20 MED ORDER — IODIXANOL 320 MG/ML IV SOLN
INTRAVENOUS | Status: DC | PRN
  Administered 2024-01-20: 130 mL

## 2024-01-20 MED ORDER — SODIUM CHLORIDE 0.9 % IV SOLN: 250.0000 mL | INTRAVENOUS | Status: AC | PRN

## 2024-01-20 MED ORDER — HEPARIN (PORCINE) 25000 UT/250ML-% IV SOLN
400.0000 [IU]/h | INTRAVENOUS | Status: DC
  Administered 2024-01-20: 400 [IU]/h via INTRA_ARTERIAL

## 2024-01-20 MED ORDER — DIAZEPAM 5 MG/ML IJ SOLN
5.0000 mg | Freq: Once | INTRAMUSCULAR | Status: DC
  Filled 2024-01-20: qty 2

## 2024-01-20 MED ORDER — LABETALOL HCL 5 MG/ML IV SOLN: 10.0000 mg | INTRAVENOUS | Status: DC | PRN

## 2024-01-20 MED ORDER — HEPARIN SODIUM (PORCINE) 1000 UNIT/ML IJ SOLN
INTRAMUSCULAR | Status: AC
  Filled 2024-01-20: qty 10

## 2024-01-20 MED ORDER — SODIUM CHLORIDE 0.9 % IV SOLN
0.5000 mg/h | INTRAVENOUS | Status: DC
  Filled 2024-01-20 (×3): qty 10

## 2024-01-20 MED ORDER — HEPARIN (PORCINE) IN NACL 1000-0.9 UT/500ML-% IV SOLN
INTRAVENOUS | Status: DC | PRN
  Administered 2024-01-20: 1500 mL

## 2024-01-20 MED ORDER — ALTEPLASE 2 MG IJ SOLR
INTRAMUSCULAR | Status: AC
  Filled 2024-01-20: qty 6

## 2024-01-20 MED ORDER — HEPARIN (PORCINE) 25000 UT/250ML-% IV SOLN
INTRAVENOUS | Status: AC
  Filled 2024-01-20: qty 250

## 2024-01-20 MED ORDER — SODIUM CHLORIDE 0.9% FLUSH: 3.0000 mL | INTRAVENOUS | Status: DC | PRN

## 2024-01-20 MED ORDER — MAGNESIUM SULFATE 4 GM/100ML IV SOLN
4.0000 g | Freq: Once | INTRAVENOUS | Status: AC
  Administered 2024-01-21: 4 g via INTRAVENOUS
  Filled 2024-01-20: qty 100

## 2024-01-20 MED ORDER — FENTANYL CITRATE PF 50 MCG/ML IJ SOSY
PREFILLED_SYRINGE | INTRAMUSCULAR | Status: AC
  Filled 2024-01-20: qty 1

## 2024-01-20 MED ORDER — LACTATED RINGERS IV SOLN: INTRAVENOUS | Status: DC

## 2024-01-20 MED ORDER — FAMOTIDINE 20 MG PO TABS: 40.0000 mg | ORAL_TABLET | Freq: Once | ORAL | Status: DC | PRN

## 2024-01-20 MED ORDER — METHYLPREDNISOLONE SODIUM SUCC 125 MG IJ SOLR: 125.0000 mg | Freq: Once | INTRAMUSCULAR | Status: DC | PRN

## 2024-01-20 MED ORDER — FENTANYL CITRATE (PF) 100 MCG/2ML IJ SOLN
INTRAMUSCULAR | Status: DC | PRN
  Administered 2024-01-20: 12.5 ug via INTRAVENOUS
  Administered 2024-01-20: 25 ug via INTRAVENOUS
  Administered 2024-01-20: 12.5 ug via INTRAVENOUS
  Administered 2024-01-20: 25 ug via INTRAVENOUS
  Administered 2024-01-20 (×4): 12.5 ug via INTRAVENOUS
  Administered 2024-01-20: 50 ug via INTRAVENOUS
  Administered 2024-01-20: 25 ug via INTRAVENOUS

## 2024-01-20 MED ORDER — HYDROMORPHONE HCL 1 MG/ML IJ SOLN
0.5000 mg | Freq: Once | INTRAMUSCULAR | Status: AC
  Administered 2024-01-20: 0.5 mg via INTRAVENOUS
  Filled 2024-01-20: qty 1

## 2024-01-20 MED ORDER — MIDAZOLAM HCL 2 MG/ML PO SYRP: 8.0000 mg | ORAL_SOLUTION | Freq: Once | ORAL | Status: DC | PRN

## 2024-01-20 MED ORDER — SODIUM CHLORIDE 0.9 % IV SOLN
1.0000 mg/h | INTRAVENOUS | Status: AC
  Administered 2024-01-20: 1 mg/h
  Filled 2024-01-20: qty 10

## 2024-01-20 MED ORDER — SODIUM CHLORIDE 0.9 % IV SOLN: INTRAVENOUS | Status: DC

## 2024-01-20 MED ORDER — CEFAZOLIN SODIUM-DEXTROSE 2-4 GM/100ML-% IV SOLN
2.0000 g | INTRAVENOUS | Status: AC
  Administered 2024-01-20: 2 g via INTRAVENOUS

## 2024-01-20 MED ORDER — MIDAZOLAM HCL 5 MG/5ML IJ SOLN
INTRAMUSCULAR | Status: AC
  Filled 2024-01-20: qty 5

## 2024-01-20 MED ORDER — HYDRALAZINE HCL 20 MG/ML IJ SOLN: 5.0000 mg | INTRAMUSCULAR | Status: DC | PRN

## 2024-01-20 MED ORDER — ONDANSETRON HCL 4 MG/2ML IJ SOLN: 4.0000 mg | Freq: Four times a day (QID) | INTRAMUSCULAR | Status: DC | PRN

## 2024-01-20 MED ORDER — NITROGLYCERIN 1 MG/10 ML FOR IR/CATH LAB
INTRA_ARTERIAL | Status: AC
  Filled 2024-01-20: qty 10

## 2024-01-20 MED ORDER — STERILE WATER FOR INJECTION IJ SOLN
INTRAMUSCULAR | Status: AC
Start: 2024-01-20 — End: ?
  Filled 2024-01-20: qty 10

## 2024-01-20 MED ORDER — DIPHENOXYLATE-ATROPINE 2.5-0.025 MG PO TABS
1.0000 | ORAL_TABLET | Freq: Four times a day (QID) | ORAL | Status: DC | PRN
  Administered 2024-01-24: 1 via ORAL
  Filled 2024-01-20: qty 1

## 2024-01-20 MED ORDER — HYDROMORPHONE HCL 1 MG/ML IJ SOLN
1.0000 mg | INTRAMUSCULAR | Status: DC | PRN
  Administered 2024-01-20 – 2024-01-21 (×4): 1 mg via INTRAVENOUS
  Filled 2024-01-20 (×4): qty 1

## 2024-01-20 MED ORDER — LACTATED RINGERS IV BOLUS
1000.0000 mL | Freq: Once | INTRAVENOUS | Status: AC
  Administered 2024-01-20: 1000 mL via INTRAVENOUS

## 2024-01-20 MED ORDER — NITROGLYCERIN 1 MG/10 ML FOR IR/CATH LAB
INTRA_ARTERIAL | Status: DC | PRN
  Administered 2024-01-20: 400 ug via INTRA_ARTERIAL

## 2024-01-20 MED ORDER — ONDANSETRON HCL 4 MG/2ML IJ SOLN
4.0000 mg | Freq: Four times a day (QID) | INTRAMUSCULAR | Status: DC | PRN
  Administered 2024-01-21 – 2024-01-27 (×2): 4 mg via INTRAVENOUS
  Filled 2024-01-20 (×2): qty 2

## 2024-01-20 MED ORDER — LEVOTHYROXINE SODIUM 50 MCG PO TABS
75.0000 ug | ORAL_TABLET | Freq: Every day | ORAL | Status: DC
  Administered 2024-01-22 – 2024-01-28 (×7): 75 ug via ORAL
  Filled 2024-01-20: qty 2
  Filled 2024-01-20 (×3): qty 1
  Filled 2024-01-20 (×3): qty 2
  Filled 2024-01-20: qty 1

## 2024-01-20 MED ORDER — ALTEPLASE 1 MG/ML SYRINGE FOR VASCULAR PROCEDURE
INTRAMUSCULAR | Status: DC | PRN
  Administered 2024-01-20: 6 mg via INTRA_ARTERIAL

## 2024-01-20 MED ORDER — DIPHENHYDRAMINE HCL 50 MG/ML IJ SOLN: 50.0000 mg | Freq: Once | INTRAMUSCULAR | Status: DC | PRN

## 2024-01-20 MED ORDER — MIDAZOLAM HCL 2 MG/2ML IJ SOLN
INTRAMUSCULAR | Status: DC | PRN
Start: 2024-01-20 — End: 2024-01-20
  Administered 2024-01-20 (×3): 1 mg via INTRAVENOUS
  Administered 2024-01-20: .5 mg via INTRAVENOUS
  Administered 2024-01-20: 1 mg via INTRAVENOUS
  Administered 2024-01-20 (×2): .5 mg via INTRAVENOUS
  Administered 2024-01-20: 1 mg via INTRAVENOUS
  Administered 2024-01-20: .5 mg via INTRAVENOUS

## 2024-01-20 MED ORDER — HYDROMORPHONE HCL 1 MG/ML IJ SOLN: 1.0000 mg | Freq: Once | INTRAMUSCULAR | Status: DC | PRN

## 2024-01-20 MED ORDER — ALTEPLASE 2 MG IJ SOLR
INTRAMUSCULAR | Status: AC
  Filled 2024-01-20: qty 8

## 2024-01-20 MED ORDER — TIROFIBAN HCL IN NACL 5-0.9 MG/100ML-% IV SOLN
INTRAVENOUS | Status: AC
  Filled 2024-01-20: qty 100

## 2024-01-20 MED ORDER — SODIUM CHLORIDE 0.9% FLUSH
3.0000 mL | Freq: Two times a day (BID) | INTRAVENOUS | Status: DC
  Administered 2024-01-20 – 2024-01-21 (×2): 3 mL via INTRAVENOUS

## 2024-01-20 SURGICAL SUPPLY — 40 items
BALLOON ARMADA 2.5X200X150X (BALLOONS) IMPLANT
BALLOON ARMADA 2X200X150 (BALLOONS) IMPLANT
BALLOON LUTONIX 018 4X220X130 (BALLOONS) IMPLANT
BALLOON LUTONIX 018 5X100X130 (BALLOONS) IMPLANT
BALLOON LUTONIX 018 5X300X130 (BALLOONS) IMPLANT
BALLOON LUTONIX 018 6X40X130 (BALLOONS) IMPLANT
BIOPATCH WHT 1IN DISK W/4.0 H (GAUZE/BANDAGES/DRESSINGS) IMPLANT
CATH ANGIO 5F PIGTAIL 65CM (CATHETERS) IMPLANT
CATH AURYON ATHERECTOMY 2.0 (CATHETERS) IMPLANT
CATH BEACON 5 .038 100 VERT TP (CATHETERS) IMPLANT
CATH CXI SUPP ANG 4FR 135 (CATHETERS) IMPLANT
CATH INFUS 135X50 (CATHETERS) IMPLANT
CATH MICROCATH PRGRT 2.8F 130 (MICROCATHETER) IMPLANT
CATH SEEKER .018X150 (CATHETERS) IMPLANT
CATH VERT 5FR 125CM (CATHETERS) IMPLANT
COVER PROBE ULTRASOUND 5X96 (MISCELLANEOUS) IMPLANT
DEVICE PRESTO INFLATION (MISCELLANEOUS) IMPLANT
DEVICE TORQUE (MISCELLANEOUS) IMPLANT
GLIDEWIRE ADV .035X260CM (WIRE) IMPLANT
GLIDEWIRE ANGLED SS 035X260CM (WIRE) IMPLANT
GOWN STRL REUS W/ TWL LRG LVL3 (GOWN DISPOSABLE) ×2 IMPLANT
KIT CV MULTILUMEN 7FR 20 (SET/KITS/TRAYS/PACK) IMPLANT
NDL ENTRY 21GA 7CM ECHOTIP (NEEDLE) IMPLANT
NEEDLE ENTRY 21GA 7CM ECHOTIP (NEEDLE) ×4 IMPLANT
PACK ANGIOGRAPHY (CUSTOM PROCEDURE TRAY) ×2 IMPLANT
SET INTRO CAPELLA COAXIAL (SET/KITS/TRAYS/PACK) IMPLANT
SHEATH BRITE TIP 5FRX11 (SHEATH) IMPLANT
SHEATH PINNACLE MP 6F 45CM (SHEATH) IMPLANT
SHEATH PINNACLE ST 6F 65CM (SHEATH) IMPLANT
STENT LIFESTENT 5F 6X100X135 (Permanent Stent) IMPLANT
STENT LIFESTENT 5F 6X30X135 (Permanent Stent) IMPLANT
STENT VIABAHN 6X25X120 (Permanent Stent) IMPLANT
SUT SILK 0 FSL (SUTURE) IMPLANT
SYR MEDRAD MARK 7 150ML (SYRINGE) IMPLANT
TUBING CONNECTING 10 (TUBING) IMPLANT
TUBING CONTRAST HIGH PRESS 72 (TUBING) IMPLANT
WIRE COMMAND ST 018 300CM (WIRE) IMPLANT
WIRE COMMAND ST ANG 014 300 (WIRE) IMPLANT
WIRE G V18X300CM (WIRE) IMPLANT
WIRE J 3MM .035X145CM (WIRE) IMPLANT

## 2024-01-20 NOTE — Progress Notes (Signed)
 Verbal orders from Dr. Prescilla Brod to start foley prior to starting TPA. Dr. Prescilla Brod aware of delay in being able to start foley. Charge ICU nurse coming to assist.  Per Dr. Prescilla Brod, okay to start TPA if this try is unsuccessful.

## 2024-01-20 NOTE — H&P (View-Only) (Signed)
 Called patient's husband to update him on the procedure.  I answered his questions and informed him we are planning to return to special procedures tomorrow at 8 AM

## 2024-01-20 NOTE — Progress Notes (Signed)
 Called patient's husband to update him on the procedure.  I answered his questions and informed him we are planning to return to special procedures tomorrow at 8 AM

## 2024-01-20 NOTE — Consult Note (Addendum)
 NAME:  Melanie Browning, MRN:  [id number], DOB:  11/25/1940, LOS: 0 ADMISSION DATE:  01/20/2024, CONSULTATION DATE:  01/20/24 REFERRING MD:  Dr. 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 She returned for outpatient follow up after left common femoral artery to posterior tibial artery bypass in October 2024 with complaints of worsening claudication symptoms as well as numbness and pain at night when she lies down. At outpatient follow up on 4/15 there were no open wounds noted but studies showed that the graft had occluded just past the region with some diminished monophasic flow in the distal posterior tibial artery.  She was instructed to present on 4/22 for scheduled angiography of the LLE with hope for limb salvage.  Hospital course: She underwent LLE angiography with angioplasty and stent placement left SFA and popliteal artery, angioplasty of the left posterior tibial artery, thrombectomy of left SFA, popliteal/ tibioperoneal trunk/posterior tibial. Thrombolysis was initiated with TPA and heparin  to continue overnight. Plans for vascular intervention on 01/21/24. Overnight patient having severe pain and developed new onset A-fib RVR. Patient asymptomatic bedside, only complaints are some anxiety over current clinical condition and outcomes and severe pain.   Significant labs: (Labs/ Imaging personally reviewed) I, Recardo Canal Rust-Chester, AGACNP-BC, personally viewed and interpreted this ECG. EKG Interpretation: Date: 01/20/24, EKG Time: 22:41, Rate: 132, Rhythm: A-fib RVR, QRS Axis:  RAD, Intervals: normal, ST/T Wave abnormalities: ST depression inferior and leads v2-v6, Narrative Interpretation: A-fib RVR with signs of ischemia Chemistry: Na+:137, K+: 3.2, BUN/Cr.: 21/ 0.52, Serum CO2/ AG: 19/ 11, Mg: 1.5 Hematology: WBC: 6.2,  Hgb: 11.7,   PCCM consulted for assistance in management and monitoring due to new onset A-fib RVR in the setting of severe reperfusion pain.  Pertinent  Medical History  Aortic Stenosis CAD HFpEF HLD HPV HLD Hypothyroidism Macrocytic anemia PAD Polymyalgia rheumatica  Significant Hospital Events: Including procedures, antibiotic start and stop dates in addition to other pertinent events   01/20/24: Admit to ICU by vascular surgery with ischemic LLE s/p angiogram, stent placement and lytic therapy overnight. New onset A-fib RVR, PCCM consulted  Interim History / Subjective:  Patient alert and responsive, anxious and in severe pain. Plan of care discussed, all questions and concerns answered at this time.  Objective   Blood pressure (!) 125/97, pulse 80, temperature 97.8 F (36.6 C), temperature source Oral, resp. rate 14, height 5\' 8"  (1.727 m), weight 66 kg, SpO2 97%.        Intake/Output Summary (Last 24 hours) at 01/20/2024 2204 Last data filed at 01/20/2024 2156 Gross per 24 hour  Intake --  Output 225 ml  Net -225 ml   Filed Weights   01/20/24 1433  Weight: 66 kg    Examination: General: Adult female, acutely ill, lying in bed, NAD HEENT: MM pink/moist, anicteric, atraumatic, neck supple Neuro: A&O x 4, able to follow commands, PERRL +3, MAE CV: s1s2 irregular, A-fib RVR on monitor, no r/m/g Pulm: Regular, non labored on RA, breath sounds clear-BUL & clear/ diminished-BLL GI: soft, rounded, non tender, bs x 4 GU: foley in place with clear yellow urine Skin: oozing noted at R fem site- unable to clearly see site due to pressure dressing- no rashes/lesions noted Extremities: warm/dry, pulses + 1 R/P(R side), L side absent pedal, doppler tibial +1 radial, no edema noted  Resolved Hospital Problem list     Assessment & Plan:  New Onset Atrial Fibrillation with Rapid Ventricular Response ST depression inferior & leads V2-V6 CHA2DS2-VASc score: 5 points - STAT  BMP, Mg, Phos. Recent H&H stable - 1 L LR bolus ordered, will add one time dilaudid  dose with valium  IV to see if pain can be better controlled - consider Lopressor  IVP vs amiodarone  bolus - Heparin  drip already running with alteplase  overnight with some oozing at site, discussed with vascular, will hold off titrating heparin  drip until the AM - Trend troponin, f/u TSH & thyroid  panel - Echocardiogram ordered - f/u EKG in AM - Consider cardiology consultation depending on w/u above - Continuous cardiac monitoring  Hypokalemia Hypomagnesemia - Daily BMP, replace electrolytes PRN - 4 g of Mg & 60 meq K+ ordered  PAD Left Ischemic Leg - vascular managing - continue lytics and heparin  overnight - monitor oozing, CBC & fibrinogen    Best Practice (right click and "Reselect all SmartList Selections" daily)  Diet/type: clear liquids DVT prophylaxis systemic heparin  Pressure ulcer(s): N/A GI prophylaxis: N/A Lines: N/A Foley:  Yes, and it is still needed Code Status:  full code Last date of multidisciplinary goals of care discussion [per primary 01/20/24]  Labs   CBC: Recent Labs  Lab 01/20/24 2055  WBC 6.2  HGB 11.7*  HCT 34.5*  MCV 98.0  PLT 166    Basic Metabolic Panel: Recent Labs  Lab 01/20/24 1425  BUN 23  CREATININE 0.71   GFR: Estimated Creatinine Clearance: 55.6 mL/min (by C-G formula based on SCr of 0.71 mg/dL). Recent Labs  Lab 01/20/24 2055  WBC 6.2    Liver Function Tests: No results for input(s): "AST", "ALT", "ALKPHOS", "BILITOT", "PROT", "ALBUMIN" in the last 168 hours. No results for input(s): "LIPASE", "AMYLASE" in the last 168 hours. No results for input(s): "AMMONIA" in the last 168 hours.  ABG No results found for: "PHART", "PCO2ART", "PO2ART", "HCO3", "TCO2", "ACIDBASEDEF", "O2SAT"   Coagulation Profile: No results for input(s): "INR", "PROTIME" in the last 168 hours.  Cardiac Enzymes: No results for input(s): "CKTOTAL", "CKMB",  "CKMBINDEX", "TROPONINI" in the last 168 hours.  HbA1C: No results found for: "HGBA1C"  CBG: Recent Labs  Lab 01/20/24 2048  GLUCAP 77    Review of Systems: positives in BOLD  Gen: Denies fever, chills, weight change, fatigue, night sweats, bilateral lower extremity pain/ severe pain Left foot and ankle HEENT: Denies blurred vision, double vision, hearing loss, tinnitus, sinus congestion, rhinorrhea, sore throat, neck stiffness, dysphagia PULM: Denies shortness of breath, cough, sputum production, hemoptysis, wheezing CV: Denies chest pain, edema, orthopnea, paroxysmal nocturnal dyspnea, palpitations GI: Denies abdominal pain, nausea, vomiting, diarrhea, hematochezia, melena, constipation, change in bowel habits GU: Denies dysuria, hematuria, polyuria, oliguria, urethral discharge Endocrine: Denies hot or cold intolerance, polyuria, polyphagia or appetite change Derm: Denies rash, dry skin, scaling or peeling skin change Heme: Denies easy bruising, bleeding, bleeding gums Neuro: Denies headache, numbness, weakness, slurred speech, loss of memory or consciousness  Past Medical History:  She,  has a past medical history of Aortic atherosclerosis (HCC), Aortic stenosis (06/26/2023), Arthritis, 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), CAD (coronary artery disease) (11/09/2020), Cervical spondylosis, Colitis, 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), Diastolic dysfunction (06/26/2023), Dry eye, Dyslipidemia, History of cervical cancer, HPV (human papilloma virus) infection, Hyperlipidemia, Hypothyroidism, IGT (impaired glucose tolerance), Left upper lobe pulmonary nodule, Long term current use of aspirin , Long term current use  of ticagrelor  therapy, Macrocytic anemia, Osteoporosis, PAD (peripheral artery disease) (HCC), Polymyalgia rheumatica (HCC), Skin cancer of nose, Transaminitis, and Vaginal dysplasia.   Surgical History:    Past Surgical History:  Procedure Laterality Date   COLONOSCOPY WITH PROPOFOL  N/A 10/04/2020   Procedure: COLONOSCOPY WITH PROPOFOL ;  Surgeon: Toledo, Alphonsus Jeans, MD;  Location: [facility];  Service: Gastroenterology;  Laterality: N/A;   COLPOSCOPY     CORONARY STENT INTERVENTION N/A 11/15/2020   Procedure: CORONARY STENT INTERVENTION;  Surgeon: Antonette Batters, MD;  Location: [facility];  Service: Cardiovascular;  Laterality: N/A;   FEMORAL-POPLITEAL BYPASS GRAFT Left 07/16/2023   Procedure: BYPASS GRAFT FEMORAL-POPLITEAL ARTERY (FEMORAL-POSTERIOR TIBIAL BYPASS W/ SAPHENOUS VEIN- [id number]);  Surgeon: Jackquelyn Mass, MD;  Location: [facility] ORS;  Service: Vascular;  Laterality: Left;   KNEE ARTHROSCOPY Left    LAPAROSCOPIC HYSTERECTOMY     LEFT HEART CATH AND CORONARY ANGIOGRAPHY N/A 11/15/2020   Procedure: LEFT HEART CATH AND CORONARY ANGIOGRAPHY;  Surgeon: Antonette Batters, MD;  Location: [facility];  Service: Cardiovascular;  Laterality: N/A;   LOWER EXTREMITY ANGIOGRAPHY Left 09/05/2020   Procedure: LOWER EXTREMITY ANGIOGRAPHY;  Surgeon: Jackquelyn Mass, MD;  Location: [facility];  Service: Cardiovascular;  Laterality: Left;   LOWER EXTREMITY ANGIOGRAPHY Left 04/10/2021   Procedure: LOWER EXTREMITY ANGIOGRAPHY;  Surgeon: Jackquelyn Mass, MD;  Location: [facility];  Service: Cardiovascular;  Laterality: Left;   LOWER EXTREMITY ANGIOGRAPHY Left 11/20/2021   Procedure: Lower Extremity Angiography;  Surgeon: Jackquelyn Mass, MD;  Location: [facility];  Service: Cardiovascular;  Laterality: Left;   LOWER EXTREMITY ANGIOGRAPHY Left 06/05/2022   Procedure: Lower Extremity Angiography;  Surgeon: Jackquelyn Mass, MD;  Location: [facility];  Service: Cardiovascular;  Laterality: Left;   LOWER EXTREMITY ANGIOGRAPHY Left 07/23/2022   Procedure: Lower Extremity Angiography;  Surgeon: Jackquelyn Mass, MD;  Location: [facility]  INV[facility] CV [facility];  Service: Cardiovascular;  Laterality: Left;   LOWER EXTREMITY ANGIOGRAPHY Left 12/27/2022   Procedure: Lower Extremity Angiography;  Surgeon: Jackquelyn Mass, MD;  Location: [facility];  Service: Cardiovascular;  Laterality: Left;   LOWER EXTREMITY ANGIOGRAPHY Left 06/17/2023   Procedure: Lower Extremity Angiography;  Surgeon: Jackquelyn Mass, MD;  Location: [facility];  Service: Cardiovascular;  Laterality: Left;   TOTAL ABDOMINAL HYSTERECTOMY       Social History:   reports that she has never smoked. She has never used smokeless tobacco. She reports current alcohol  use of about 1.0 standard drink of alcohol  per week. She reports that she does not use drugs.   Family History:  Her family history includes Cancer in her mother; Heart attack (age of onset: 3) in her father; Heart disease in her father; Heart failure in her mother; Pancreatic cancer in her mother.   Allergies Allergies  Allergen Reactions   Lactose Other (See Comments) and Nausea And Vomiting    Gi upset and diarrhea  Other reaction(s): Other (See Comments)  Gi upset and diarrhea  Other reaction(s): Other (See Comments) Gi upset and diarrhea Gi upset and diarrhea Gi upset and diarrhea    Gi upset and diarrhea    Gi upset and diarrhea Gi upset and diarrhea Gi upset and diarrhea  Gi upset and diarrhea    Gi upset and diarrhea  Other reaction(s): Other (See Comments) Gi upset and diarrhea Gi upset and diarrhea Gi upset and diarrhea  Gi upset and  diarrhea  Gi upset and diarrhea Gi upset and diarrhea Gi upset and diarrhea     Home Medications  Prior to Admission medications   Medication Sig Start Date End Date Taking? Authorizing Provider  aspirin  EC 81 MG tablet Take 81 mg by mouth every evening. Swallow whole.   Yes [provider]  B Complex Vitamins (VITAMIN B-COMPLEX) TABS Take 1 tablet by mouth daily.   Yes [provider]  budesonide  (ENTOCORT EC ) 3  MG 24 hr capsule Take 3 capsules (9 mg total) by mouth every morning. 09/26/23  Yes Marnee Sink, MD  diphenoxylate -atropine  (LOMOTIL ) 2.5-0.025 MG tablet TAKE 1 TABLET BY MOUTH 3 TIMES DAILY AS NEEDED FOR DIARRHEA. 09/12/21  Yes Vanga, Rohini Reddy, MD  levothyroxine  (SYNTHROID ) 75 MCG tablet Take 75 mcg by mouth daily before breakfast.  02/19/19  Yes [provider]  losartan  (COZAAR ) 25 MG tablet Take 25 mg by mouth daily. 10/16/21  Yes [provider]  rosuvastatin (CRESTOR) 5 MG tablet Take 5 mg by mouth daily. 06/23/23 06/22/24 Yes [provider]  ticagrelor  (BRILINTA ) 90 MG TABS tablet TAKE ONE TABLET BY MOUTH TWICE DAILY 12/08/23  Yes Brown, Fallon E, NP  budesonide  (ENTOCORT EC ) 3 MG 24 hr capsule Take 9 mg by mouth daily. 11/12/21   [provider]  estradiol  (ESTRACE ) 0.1 MG/GM vaginal cream Insert 1/4 applicator full into vagina at night twice a week 10/21/22   Allen, Lauren G, NP  HYDROcodone -acetaminophen  (NORCO) 10-325 MG tablet Take 1 tablet by mouth every 6 (six) hours as needed for severe pain (pain score 7-10). 08/21/23   Schnier, Ninette Basque, MD  nitroGLYCERIN  (NITROSTAT ) 0.4 MG SL tablet Place under the tongue. Patient not taking: Reported on 07/16/2023 10/04/21   [provider]     Critical care time: 55 minutes      Eliott Guess, AGACNP-BC Acute Care Nurse Practitioner [facility]   [phone] / [phone] Please see Amion for details.

## 2024-01-20 NOTE — Consult Note (Incomplete)
 NAME:  Melanie Browning, MRN:  [id number], DOB:  Dec 14, 1940, LOS: 0 ADMISSION DATE:  01/20/2024, CONSULTATION DATE:  01/20/24 REFERRING MD:  Dr. 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 She returned for outpatient follow up after left common femoral artery to posterior tibial artery bypass in October 2024 with complaints of worsening claudication symptoms as well as numbness and pain at night when she lies down. At outpatient follow up on 4/15 there were no open wounds noted but studies showed that the graft had occluded just past the region with some diminished monophasic flow in the distal posterior tibial artery.  She was instructed to present on 4/22 for scheduled angiography of the LLE with hope for limb salvage.  Hospital course: She underwent LLE angiography with angioplasty and stent placement left SFA and popliteal artery, angioplasty of the left posterior tibial artery, thrombectomy of left SFA, popliteal/ tibioperoneal trunk/posterior tibial. Thrombolysis was initiated with TPA and heparin  to continue overnight. Plans for vascular intervention on 01/21/24. Overnight patient having severe pain and developed new onset A-fib RVR. Patient asymptomatic bedside, only complaints are some anxiety over current clinical condition and outcomes and severe pain.   Significant labs: (Labs/ Imaging personally reviewed) I, Recardo Canal Rust-Chester, AGACNP-BC, personally viewed and interpreted this ECG. EKG Interpretation: Date: ***, EKG Time: ***, Rate: ***, Rhythm: ***, QRS Axis:  *** Intervals: ***, ST/T Wave abnormalities: ***, Narrative Interpretation: *** Chemistry: Na+:***, K+: ***, BUN/Cr.: ***, Serum CO2/ AG: *** Hematology: WBC: ***, Hgb: ***,  Troponin: ***, BNP: ***, Lactic/ PCT: ***, COVID-19 & Influenza A/B: *** ABG:  *** CXR ***: *** CT ***: ***  PCCM consulted for admission due to ***.  Pertinent  Medical History  Aortic Stenosis CAD HFpEF HLD HPV HLD Hypothyroidism Macrocytic anemia PAD Polymyalgia rheumatica  Significant Hospital Events: Including procedures, antibiotic start and stop dates in addition to other pertinent events   ***  Interim History / Subjective:  ***  Objective   Blood pressure (!) 125/97, pulse 80, temperature 97.8 F (36.6 C), temperature source Oral, resp. rate 14, height 5\' 8"  (1.727 m), weight 66 kg, SpO2 97%.        Intake/Output Summary (Last 24 hours) at 01/20/2024 2204 Last data filed at 01/20/2024 2156 Gross per 24 hour  Intake --  Output 225 ml  Net -225 ml   Filed Weights   01/20/24 1433  Weight: 66 kg    Examination: General: Adult female, acutely ill, lying in bed, NAD HEENT: MM pink/moist, anicteric, atraumatic, neck supple Neuro: A&O x 4, able to follow commands, PERRL +3, MAE CV: s1s2 irregular, A-fib RVR on monitor, no r/m/g Pulm: Regular, non labored on RA, breath sounds clear-BUL & clear/ diminished-BLL GI: soft, rounded, non tender, bs x 4 GU: foley in place with clear yellow urine Skin: *** no rashes/lesions noted Extremities: warm/dry, pulses + 2 R/P, no edema noted  Resolved Hospital Problem list     Assessment & Plan:  New Onset Atrial Fibrillation with Rapid Ventricular Response CHA2DS2-VASc score: 5 points - Heparin  drip per pharmacy consult - f/u TSH & thyroid  panel - Echocardiogram ordered - Cardiology consulted, appreciate input - Continuous cardiac monitoring   Best Practice (right click and "Reselect all SmartList Selections" daily)  Diet/type: clear liquids DVT prophylaxis systemic heparin  Pressure ulcer(s): N/A GI prophylaxis: N/A Lines:  N/A Foley:  Yes, and it is still needed Code Status:  full code Last date of multidisciplinary goals of care discussion [per primary 01/20/24]  Labs   CBC: Recent  Labs  Lab 01/20/24 2055  WBC 6.2  HGB 11.7*  HCT 34.5*  MCV 98.0  PLT 166    Basic Metabolic Panel: Recent Labs  Lab 01/20/24 1425  BUN 23  CREATININE 0.71   GFR: Estimated Creatinine Clearance: 55.6 mL/min (by C-G formula based on SCr of 0.71 mg/dL). Recent Labs  Lab 01/20/24 2055  WBC 6.2    Liver Function Tests: No results for input(s): "AST", "ALT", "ALKPHOS", "BILITOT", "PROT", "ALBUMIN" in the last 168 hours. No results for input(s): "LIPASE", "AMYLASE" in the last 168 hours. No results for input(s): "AMMONIA" in the last 168 hours.  ABG No results found for: "PHART", "PCO2ART", "PO2ART", "HCO3", "TCO2", "ACIDBASEDEF", "O2SAT"   Coagulation Profile: No results for input(s): "INR", "PROTIME" in the last 168 hours.  Cardiac Enzymes: No results for input(s): "CKTOTAL", "CKMB", "CKMBINDEX", "TROPONINI" in the last 168 hours.  HbA1C: No results found for: "HGBA1C"  CBG: Recent Labs  Lab 01/20/24 2048  GLUCAP 77    Review of Systems: positives in BOLD  Gen: Denies fever, chills, weight change, fatigue, night sweats, bilateral lower extremity pain/ severe pain Left foot and ankle HEENT: Denies blurred vision, double vision, hearing loss, tinnitus, sinus congestion, rhinorrhea, sore throat, neck stiffness, dysphagia PULM: Denies shortness of breath, cough, sputum production, hemoptysis, wheezing CV: Denies chest pain, edema, orthopnea, paroxysmal nocturnal dyspnea, palpitations GI: Denies abdominal pain, nausea, vomiting, diarrhea, hematochezia, melena, constipation, change in bowel habits GU: Denies dysuria, hematuria, polyuria, oliguria, urethral discharge Endocrine: Denies hot or cold intolerance, polyuria, polyphagia or appetite change Derm: Denies rash, dry skin, scaling or peeling skin change Heme: Denies easy bruising, bleeding, bleeding gums Neuro: Denies headache, numbness, weakness, slurred speech, loss of memory or consciousness  Past Medical  History:  She,  has a past medical history of Aortic atherosclerosis (HCC), Aortic stenosis (06/26/2023), Arthritis, 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), CAD (coronary artery disease) (11/09/2020), Cervical spondylosis, Colitis, 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), Diastolic dysfunction (06/26/2023), Dry eye, Dyslipidemia, History of cervical cancer, HPV (human papilloma virus) infection, Hyperlipidemia, Hypothyroidism, IGT (impaired glucose tolerance), Left upper lobe pulmonary nodule, Long term current use of aspirin , Long term current use of ticagrelor  therapy, Macrocytic anemia, Osteoporosis, PAD (peripheral artery disease) (HCC), Polymyalgia rheumatica (HCC), Skin cancer of nose, Transaminitis, and Vaginal dysplasia.   Surgical History:   Past Surgical History:  Procedure Laterality Date  . COLONOSCOPY WITH PROPOFOL  N/A 10/04/2020   Procedure: COLONOSCOPY WITH PROPOFOL ;  Surgeon: Toledo, Alphonsus Jeans, MD;  Location: [facility] ENDOSCOPY;  Service: Gastroenterology;  Laterality: N/A;  . COLPOSCOPY    . CORONARY STENT INTERVENTION N/A 11/15/2020   Procedure: CORONARY STENT INTERVENTION;  Surgeon: Antonette Batters, MD;  Location: [facility] INVASIVE CV LAB;  Service: Cardiovascular;  Laterality: N/A;  . FEMORAL-POPLITEAL BYPASS GRAFT Left 07/16/2023   Procedure: BYPASS GRAFT FEMORAL-POPLITEAL ARTERY (FEMORAL-POSTERIOR TIBIAL BYPASS W/ SAPHENOUS VEIN- [id number]);  Surgeon: Jackquelyn Mass, MD;  Location: [facility] ORS;  Service: Vascular;  Laterality: Left;  . KNEE ARTHROSCOPY Left   . LAPAROSCOPIC HYSTERECTOMY    . LEFT HEART CATH AND CORONARY ANGIOGRAPHY N/A 11/15/2020   Procedure: LEFT HEART CATH AND CORONARY ANGIOGRAPHY;  Surgeon: Antonette Batters, MD;  Location: [facility];  Service: Cardiovascular;  Laterality: N/A;  .  LOWER EXTREMITY ANGIOGRAPHY Left 09/05/2020   Procedure: LOWER EXTREMITY ANGIOGRAPHY;  Surgeon: Jackquelyn Mass, MD;  Location: [facility];  Service: Cardiovascular;  Laterality: Left;  . LOWER EXTREMITY ANGIOGRAPHY Left 04/10/2021   Procedure: LOWER EXTREMITY ANGIOGRAPHY;  Surgeon: Jackquelyn Mass, MD;  Location: [facility];  Service: Cardiovascular;  Laterality: Left;  . LOWER EXTREMITY ANGIOGRAPHY Left 11/20/2021   Procedure: Lower Extremity Angiography;  Surgeon: Jackquelyn Mass, MD;  Location: [facility];  Service: Cardiovascular;  Laterality: Left;  . LOWER EXTREMITY ANGIOGRAPHY Left 06/05/2022   Procedure: Lower Extremity Angiography;  Surgeon: Jackquelyn Mass, MD;  Location: [facility];  Service: Cardiovascular;  Laterality: Left;  . LOWER EXTREMITY ANGIOGRAPHY Left 07/23/2022   Procedure: Lower Extremity Angiography;  Surgeon: Jackquelyn Mass, MD;  Location: [facility];  Service: Cardiovascular;  Laterality: Left;  . LOWER EXTREMITY ANGIOGRAPHY Left 12/27/2022   Procedure: Lower Extremity Angiography;  Surgeon: Jackquelyn Mass, MD;  Location: [facility];  Service: Cardiovascular;  Laterality: Left;  . LOWER EXTREMITY ANGIOGRAPHY Left 06/17/2023   Procedure: Lower Extremity Angiography;  Surgeon: Jackquelyn Mass, MD;  Location: [facility];  Service: Cardiovascular;  Laterality: Left;  . TOTAL ABDOMINAL HYSTERECTOMY       Social History:   reports that she has never smoked. She has never used smokeless tobacco. She reports current alcohol  use of about 1.0 standard drink of alcohol  per week. She reports that she does not use drugs.   Family History:  Her family history includes Cancer in her mother; Heart attack (age of onset: 92) in her father; Heart disease in her father; Heart failure in her mother; Pancreatic cancer in her mother.   Allergies Allergies  Allergen Reactions  . Lactose Other (See Comments) and Nausea And Vomiting    Gi upset and diarrhea  Other reaction(s): Other (See  Comments)  Gi upset and diarrhea  Other reaction(s): Other (See Comments) Gi upset and diarrhea Gi upset and diarrhea Gi upset and diarrhea    Gi upset and diarrhea    Gi upset and diarrhea Gi upset and diarrhea Gi upset and diarrhea  Gi upset and diarrhea    Gi upset and diarrhea  Other reaction(s): Other (See Comments) Gi upset and diarrhea Gi upset and diarrhea Gi upset and diarrhea  Gi upset and diarrhea  Gi upset and diarrhea Gi upset and diarrhea Gi upset and diarrhea     Home Medications  Prior to Admission medications   Medication Sig Start Date End Date Taking? Authorizing Provider  aspirin  EC 81 MG tablet Take 81 mg by mouth every evening. Swallow whole.   Yes [provider]  B Complex Vitamins (VITAMIN B-COMPLEX) TABS Take 1 tablet by mouth daily.   Yes [provider]  budesonide  (ENTOCORT EC ) 3 MG 24 hr capsule Take 3 capsules (9 mg total) by mouth every morning. 09/26/23  Yes Marnee Sink, MD  diphenoxylate -atropine  (LOMOTIL ) 2.5-0.025 MG tablet TAKE 1 TABLET BY MOUTH 3 TIMES DAILY AS NEEDED FOR DIARRHEA. 09/12/21  Yes Vanga, Rohini Reddy, MD  levothyroxine  (SYNTHROID ) 75 MCG tablet Take 75 mcg by mouth daily before breakfast.  02/19/19  Yes [provider]  losartan  (COZAAR ) 25 MG tablet Take 25 mg by mouth daily. 10/16/21  Yes [provider]  rosuvastatin (CRESTOR) 5 MG tablet Take 5 mg by mouth daily. 06/23/23 06/22/24 Yes [provider]  ticagrelor  (BRILINTA ) 90 MG  TABS tablet TAKE ONE TABLET BY MOUTH TWICE DAILY 12/08/23  Yes Brown, Fallon E, NP  budesonide  (ENTOCORT EC ) 3 MG 24 hr capsule Take 9 mg by mouth daily. 11/12/21   [provider]  estradiol  (ESTRACE ) 0.1 MG/GM vaginal cream Insert 1/4 applicator full into vagina at night twice a week 10/21/22   Nelda Balsam, NP  HYDROcodone -acetaminophen  (NORCO) 10-325 MG tablet Take 1 tablet by mouth every 6 (six) hours as needed for severe pain (pain score 7-10). 08/21/23    Schnier, Ninette Basque, MD  nitroGLYCERIN  (NITROSTAT ) 0.4 MG SL tablet Place under the tongue. Patient not taking: Reported on 07/16/2023 10/04/21   [provider]     Critical care time: 55 minutes      Eliott Guess, AGACNP-BC Acute Care Nurse Practitioner [facility]   [phone] / [phone] Please see Amion for details.

## 2024-01-20 NOTE — Op Note (Signed)
 [facility]  Percutaneous Study/Intervention Procedural Note   Date of Surgery: 01/20/2024,8:27 PM  Surgeon:Koral Thaden, Ninette Basque   Pre-operative Diagnosis: Atherosclerotic occlusive disease left lower extremity with rest pain  Post-operative diagnosis:  Same  Procedure(s) Performed:  1.  Abdominal aortogram  2.  Left lower extremity angiography third order catheter placement  3.  Percutaneous transluminal angioplasty and stent placement left SFA and popliteal artery.  4.  Percutaneous transluminal angioplasty left posterior tibial artery.  5.  Thrombectomy using the laser left SFA, popliteal and tibioperoneal trunk/posterior tibial  6.  Initiation of thrombolysis  7.  Placement of a left common femoral central line with ultrasound guidance  Anesthesia: Conscious sedation was administered by the interventional radiology RN under my direct supervision. IV Versed  plus fentanyl  were utilized. Continuous ECG, pulse oximetry and blood pressure was monitored throughout the entire procedure. Conscious sedation was administered for a total of 3 hours 26 minutes and 30 seconds.  Sheath: 6 [place] destination right common femoral retrograde  Contrast: 130 cc   Fluoroscopy Time: 41.2 minutes  Indications: Patient presented to the office with increasing symptoms of her left lower extremity.  Noninvasive studies as well as physical examination demonstrated occlusion of her femoral to posterior tibial bypass graft.  Angiography with the hope for intervention is indicated for limb salvage.  The risks and benefits were reviewed all questions answered patient agrees to proceed.  Procedure:  Aireona Torelli a 83 y.o. female who was identified and appropriate procedural time out was performed.  The patient was then placed supine on the table and prepped and draped in the usual sterile fashion.  Ultrasound was used to evaluate the right common femoral artery.  It was echolucent and  pulsatile indicating it is patent .  An ultrasound image was acquired for the permanent record.  A micropuncture needle was used to access the right common femoral artery under direct ultrasound guidance.  The microwire was then advanced under fluoroscopic guidance without difficulty followed by the micro-sheath.  A 0.035 J wire was advanced without resistance and a 5Fr sheath was placed.    Pigtail catheter was advanced into the proximal abdominal aorta and AP projection of the aorta and the aortic bifurcation was obtained.  Pigtail catheter was then repositioned to above the bifurcation and an RAO projection of the iliac arteries was obtained.  Pigtail catheter and advantage wire were then used to cross the aortic bifurcation and the pigtail catheter Darolyn Elks down to the distal external iliac artery on the left.  Hand-injection of contrast then demonstrated the femoral bifurcation with the origin of the bypass.  The advantage wire was reintroduced and a 6 [place] destination sheath advanced up and over the aortic bifurcation and positioned in the mid common femoral.  6000 units of heparin  was given and allowed to circulate for several minutes (an additional 2000 units of heparin  was given later in the case).  A advantage wire and Kumpe catheter were then negotiated into the bypass under fluoroscopic guidance.  Working with a variety of wires including the advantage wire, a 035 stiff angled Glidewire as well as a V18 wire and a variety of catheters including the Kumpe catheter, a seeker catheter and a CXI catheter even a Progreat catheter I was able to negotiate the V18 wire through the bypass and into the posterior tibial artery position with the tip down at the ankle.  Unfortunately, I was unable to get any catheter or even a 2 mm balloon to  track across the distal 150 mm of bypass and cross the anastomosis into the posterior tibial.  After numerous attempts I felt the only available option was to reexplore her  native circulation.  The catheters and wires were removed and using the stiff angled Glidewire and a Kumpe catheter I negotiated the wire and catheter into the SFA.  I was able to cross the occlusion and negotiate the wire and catheter down through the stents and into the distal popliteal.  After aspirating blood I performed an arteriogram by hand-injection.  This demonstrated occlusion of the posterior tibial, peroneal and anterior tibial in their proximal segments.  However, I was able to identify the posterior tibial from its midportion distally and it appeared to be a nice target measuring approximately 2-1/2 mm in diameter.  The area of the bypass anastomosis was again identified and there did appear to be some significant narrowing at this level only both proximally and distally to the anastomosis suggestive of clamp injury.  Next I was able to negotiate the Kumpe catheter and a 035 Glidewire into the posterior tibial across the occluded proximal segment and confirmed intraluminal positioning by hand-injection.  A 0.014 wire was then introduced and the IR on 2.0 laser was prepped on the field.  Laser thrombectomy was then performed beginning in the SFA and extending down through the popliteal across the tibioperoneal trunk and then into the proximal posterior tibial and the occluded segment.  2 passes were made 1 at 5 and then a second pass at 6.  Follow-up imaging demonstrated recanalization throughout the SFA popliteal and into the posterior tibial but there was significant residual occlusive disease noted.  The first lesion was at the origin of the SFA and was 70 to 75% there was a lesion at the beginning of the stented segment in the mid SFA approximately 80%, within the stented segment there were tandem lesions of 70% and a greater than 80% stenosis at its distal end of the stent in the distal popliteal.  There is also greater than 70% diffuse stenosis throughout the tibioperoneal trunk and proximal  posterior tibial artery.  A 3 mm x 200 mm [place] balloon was then advanced into the posterior tibial artery past the area of occlusion and inflated to 10 atm for 1 minute.  Was then deflated and reinflated throughout the popliteal segment.  Follow-up imaging now demonstrated patency of the posterior tibial but still with greater than 50% residual stenosis throughout its proximal segment including the tibioperoneal trunk.  Working proximally a 6 mm x 40 mm life stent was deployed at the origin of the SFA and postdilated with a 6 mm x 40 mm Lutonix drug-eluting balloon inflated to 8 atm for approximately 1 minute.  A 6 mm by the 80 mm life stent was then deployed across the SFA proximal to the previous stents down into the existing stents and postdilated with a 5 mm x 80 mm Lutonix drug-eluting balloon.  Follow-up imaging after these 2 stent appointments demonstrates excellent result with less than 10% residual stenosis.  In the midportion of the stented segment at the level of the femoral condyles a greater than 70% narrowing with an element of stent deformity is noted and therefore a 6 mm x 2.5 cm Viabahn is deployed and postdilated with a  5 mm balloon inflated to 8 atm for 30 seconds.  At this point follow-up imaging demonstrated new thrombus formation throughout the femoral-popliteal arteries.  There was still patency of the tibioperoneal  trunk and posterior tibial however I felt that at this point performing mechanical thrombectomy or adding further stents would not suffice and elected to initiate thrombolysis overnight.  An infusion catheter with the 50 cm infusion length was then advanced over the wire the wire removed and the obturator placed.  6 mg of tPA was then infused across the SFA popliteal segment.  The ultrasound was returned to the field and placed in a sterile field.  Ultrasounding the right groin the common femoral vein was identified.  It was accessed with a microneedle followed by a  microwire and a micro sheath.  J-wire was then advanced followed by the dilator and a triple-lumen catheter placed.  Catheter was flushed with heparinized saline and then secured to the skin with 2-0 silk suture.  The destination sheath was then secured to the skin of the thigh with 0 silk sutures.  Heparin  infusion at 400 units/h through the sideport of the sheath in association with tPA through the infusion catheter will be initiated and the patient will be transferred to the intensive care unit.   Findings:   Aortogram: The abdominal aorta and bilateral iliac arteries are widely patent and free of hemodynamically significant stenosis.  Left Lower Extremity: The common femoral artery is widely patent as is the profunda femoris.  Bypass is noted to be patent proximally but occludes in the distal thigh.  The SFA is patent on initial imaging there is a high-grade stenosis at its origin as described below there is occlusion of the previously stented segment of the SFA and popliteal.  Initially there is complete nonvisualization of any tibial runoff.  Follow-up imaging after laser thrombectomy demonstrated recanalization throughout the SFA popliteal and into the posterior tibial but there was significant residual occlusive disease noted.  The first lesion was at the origin of the SFA and was 70 to 75% there was a lesion at the beginning of the stented segment in the mid SFA approximately 80%, within the stented segment there were tandem lesions of 70% and a greater than 80% stenosis at its distal end of the stent in the distal popliteal.  There is also greater than 70% diffuse stenosis throughout the tibioperoneal trunk and proximal posterior tibial artery.  The greater than 80% stenosis proximal and distal to the anastomosis from the previous bypass is again noted as well distally the posterior tibial appears to be of good caliber and fills the medial and lateral plantar arteries as well as the pedal arch.   There is now also filling of the peroneal artery.  As noted above angioplasty of the posterior tibial and tibioperoneal trunk yielded patency but with significant residual stenosis.  Stenting of the SFA proximally and within the previously stented segment proximally and midportion of the stents also yielded an excellent result with less than 10% residual stenosis.  At this point the patient was preforming thrombus within the femoral-popliteal area and thrombolysis was initiated.  She will be returned to the angio suite tomorrow where stenting of the distal outflow is anticipated.    Disposition: Patient was taken to the recovery room in stable condition having tolerated the procedure well.  Theotis Flake Shadman Tozzi 01/20/2024,8:27 PM

## 2024-01-20 NOTE — Interval H&P Note (Signed)
 History and Physical Interval Note:  01/20/2024 2:52 PM  Melanie Browning  has presented today for surgery, with the diagnosis of LLE Angio   ASO w rest pain.  The various methods of treatment have been discussed with the patient and family. After consideration of risks, benefits and other options for treatment, the patient has consented to  Procedure(s): Lower Extremity Angiography (Left) LOWER EXTREMITY INTERVENTION (Left) as a surgical intervention.  The patient's history has been reviewed, patient examined, no change in status, stable for surgery.  I have reviewed the patient's chart and labs.  Questions were answered to the patient's satisfaction.     [name]

## 2024-01-21 ENCOUNTER — Inpatient Hospital Stay (HOSPITAL_COMMUNITY)
Admission: RE | Admit: 2024-01-21 | Discharge: 2024-01-21 | Disposition: A | Discharge status: Home / Self Care | Source: Home / Self Care | Attending: Vascular Surgery | Admitting: Vascular Surgery

## 2024-01-21 ENCOUNTER — Other Ambulatory Visit (HOSPITAL_COMMUNITY): Payer: Self-pay

## 2024-01-21 ENCOUNTER — Telehealth (HOSPITAL_COMMUNITY): Payer: Self-pay | Admitting: Pharmacy Technician

## 2024-01-21 ENCOUNTER — Encounter: Payer: Self-pay | Admitting: Vascular Surgery

## 2024-01-21 ENCOUNTER — Encounter
Admission: RE | Disposition: A | Discharge status: Home Health | Payer: Self-pay | Source: Ambulatory Visit | Attending: Vascular Surgery

## 2024-01-21 DIAGNOSIS — I4891 Unspecified atrial fibrillation: Secondary | ICD-10-CM

## 2024-01-21 DIAGNOSIS — I959 Hypotension, unspecified: Secondary | ICD-10-CM

## 2024-01-21 DIAGNOSIS — I214 Non-ST elevation (NSTEMI) myocardial infarction: Secondary | ICD-10-CM

## 2024-01-21 HISTORY — PX: LOWER EXTREMITY ANGIOGRAPHY: CATH118251

## 2024-01-21 LAB — ECHOCARDIOGRAM COMPLETE
AR max vel: 1.32 cm2
AV Area VTI: 1.53 cm2
AV Area mean vel: 1.46 cm2
AV Mean grad: 12.5 mmHg
AV Peak grad: 22.1 mmHg
Ao pk vel: 2.35 m/s
Area-P 1/2: 3.24 cm2
Height: 68 in
MV VTI: 2.69 cm2
S' Lateral: 2 cm
Weight: 2326.4 [oz_av]

## 2024-01-21 LAB — CBC
HCT: 32.7 % — ABNORMAL LOW (ref 36.0–46.0)
HCT: 28.7 % — ABNORMAL LOW (ref 36.0–46.0)
HCT: 27.1 % — ABNORMAL LOW (ref 36.0–46.0)
Hemoglobin: 10.9 g/dL — ABNORMAL LOW (ref 12.0–15.0)
Hemoglobin: 9.4 g/dL — ABNORMAL LOW (ref 12.0–15.0)
Hemoglobin: 8.8 g/dL — ABNORMAL LOW (ref 12.0–15.0)
MCH: 33.1 pg (ref 26.0–34.0)
MCH: 32.9 pg (ref 26.0–34.0)
MCH: 33.5 pg (ref 26.0–34.0)
MCHC: 33.3 g/dL (ref 30.0–36.0)
MCHC: 32.8 g/dL (ref 30.0–36.0)
MCHC: 32.5 g/dL (ref 30.0–36.0)
MCV: 99.4 fL (ref 80.0–100.0)
MCV: 100.3 fL — ABNORMAL HIGH (ref 80.0–100.0)
MCV: 103 fL — ABNORMAL HIGH (ref 80.0–100.0)
Platelets: 155 10*3/uL (ref 150–400)
Platelets: 156 10*3/uL (ref 150–400)
Platelets: 146 10*3/uL — ABNORMAL LOW (ref 150–400)
RBC: 3.29 MIL/uL — ABNORMAL LOW (ref 3.87–5.11)
RBC: 2.86 MIL/uL — ABNORMAL LOW (ref 3.87–5.11)
RBC: 2.63 MIL/uL — ABNORMAL LOW (ref 3.87–5.11)
RDW: 14.5 % (ref 11.5–15.5)
RDW: 14.5 % (ref 11.5–15.5)
RDW: 14.5 % (ref 11.5–15.5)
WBC: 9.8 10*3/uL (ref 4.0–10.5)
WBC: 8.3 10*3/uL (ref 4.0–10.5)
WBC: 9.5 10*3/uL (ref 4.0–10.5)
nRBC: 0 % (ref 0.0–0.2)
nRBC: 0 % (ref 0.0–0.2)
nRBC: 0 % (ref 0.0–0.2)

## 2024-01-21 LAB — TROPONIN I (HIGH SENSITIVITY)
Troponin I (High Sensitivity): 12 ng/L (ref <18)
Troponin I (High Sensitivity): 166 ng/L (ref <18)
Troponin I (High Sensitivity): 653 ng/L (ref <18)
Troponin I (High Sensitivity): 866 ng/L (ref <18)
Troponin I (High Sensitivity): 1107 ng/L (ref <18)
Troponin I (High Sensitivity): 1018 ng/L (ref <18)

## 2024-01-21 LAB — BASIC METABOLIC PANEL WITH GFR
Anion gap: 6 (ref 5–15)
BUN: 20 mg/dL (ref 8–23)
CO2: 22 mmol/L (ref 22–32)
Calcium: 8.2 mg/dL — ABNORMAL LOW (ref 8.9–10.3)
Chloride: 105 mmol/L (ref 98–111)
Creatinine, Ser: 0.74 mg/dL (ref 0.44–1.00)
GFR, Estimated: >60 mL/min (ref >60)
Glucose, Bld: 117 mg/dL — ABNORMAL HIGH (ref 70–99)
Potassium: 4.6 mmol/L (ref 3.5–5.1)
Sodium: 133 mmol/L — ABNORMAL LOW (ref 135–145)

## 2024-01-21 LAB — HEPARIN LEVEL (UNFRACTIONATED)
Heparin Unfractionated: 0.13 [IU]/mL — ABNORMAL LOW (ref 0.30–0.70)
Heparin Unfractionated: <0.1 [IU]/mL — ABNORMAL LOW (ref 0.30–0.70)
Heparin Unfractionated: 0.81 [IU]/mL — ABNORMAL HIGH (ref 0.30–0.70)

## 2024-01-21 LAB — FIBRINOGEN
Fibrinogen: 215 mg/dL (ref 210–475)
Fibrinogen: 160 mg/dL — ABNORMAL LOW (ref 210–475)

## 2024-01-21 LAB — T4, FREE: Free T4: 0.98 ng/dL (ref 0.61–1.12)

## 2024-01-21 LAB — MAGNESIUM: Magnesium: 2.3 mg/dL (ref 1.7–2.4)

## 2024-01-21 LAB — PHOSPHORUS: Phosphorus: 2.8 mg/dL (ref 2.5–4.6)

## 2024-01-21 LAB — TSH: TSH: 2.758 u[IU]/mL (ref 0.350–4.500)

## 2024-01-21 SURGERY — LOWER EXTREMITY ANGIOGRAPHY
Anesthesia: Moderate Sedation | Laterality: Left

## 2024-01-21 MED ORDER — AMIODARONE IV BOLUS ONLY 150 MG/100ML
150.0000 mg | Freq: Once | INTRAVENOUS | Status: AC
  Administered 2024-01-21: 150 mg via INTRAVENOUS
  Filled 2024-01-21: qty 100

## 2024-01-21 MED ORDER — SODIUM CHLORIDE 0.9 % IV BOLUS
INTRAVENOUS | Status: DC | PRN
  Administered 2024-01-21: 250 mL via INTRAVENOUS

## 2024-01-21 MED ORDER — LIDOCAINE HCL (PF) 1 % IJ SOLN
INTRAMUSCULAR | Status: DC | PRN
  Administered 2024-01-20: 5 mL
  Administered 2024-01-21: 10 mL

## 2024-01-21 MED ORDER — OXYCODONE HCL 5 MG PO TABS
5.0000 mg | ORAL_TABLET | ORAL | Status: DC | PRN
  Administered 2024-01-22 – 2024-01-26 (×13): 10 mg via ORAL
  Filled 2024-01-21 (×3): qty 2
  Filled 2024-01-21 (×2): qty 1
  Filled 2024-01-21 (×2): qty 2
  Filled 2024-01-21: qty 1
  Filled 2024-01-21 (×4): qty 2
  Filled 2024-01-21: qty 1
  Filled 2024-01-21 (×2): qty 2

## 2024-01-21 MED ORDER — MIDAZOLAM HCL 2 MG/2ML IJ SOLN
INTRAMUSCULAR | Status: DC | PRN
  Administered 2024-01-21: 2 mg via INTRAVENOUS

## 2024-01-21 MED ORDER — DILTIAZEM HCL 25 MG/5ML IV SOLN: 10.0000 mg | Freq: Once | INTRAVENOUS | Status: DC

## 2024-01-21 MED ORDER — SODIUM CHLORIDE 0.9% FLUSH: 3.0000 mL | INTRAVENOUS | Status: DC | PRN

## 2024-01-21 MED ORDER — MIDAZOLAM HCL 5 MG/5ML IJ SOLN
INTRAMUSCULAR | Status: AC
  Filled 2024-01-21: qty 5

## 2024-01-21 MED ORDER — HYDROMORPHONE HCL 1 MG/ML IJ SOLN
0.5000 mg | INTRAMUSCULAR | Status: DC | PRN
  Administered 2024-01-21 (×2): 1 mg via INTRAVENOUS
  Filled 2024-01-21 (×2): qty 1

## 2024-01-21 MED ORDER — HEPARIN (PORCINE) 25000 UT/250ML-% IV SOLN
850.0000 [IU]/h | INTRAVENOUS | Status: DC
  Administered 2024-01-21: 1000 [IU]/h via INTRAVENOUS
  Administered 2024-01-22: 900 [IU]/h via INTRAVENOUS
  Filled 2024-01-21: qty 250

## 2024-01-21 MED ORDER — HEPARIN SODIUM (PORCINE) 1000 UNIT/ML IJ SOLN
INTRAMUSCULAR | Status: AC
  Filled 2024-01-21: qty 10

## 2024-01-21 MED ORDER — HEPARIN (PORCINE) IN NACL 1000-0.9 UT/500ML-% IV SOLN
INTRAVENOUS | Status: DC | PRN
  Administered 2024-01-21: 1000 mL

## 2024-01-21 MED ORDER — AMIODARONE HCL 200 MG PO TABS
200.0000 mg | ORAL_TABLET | Freq: Two times a day (BID) | ORAL | Status: DC
  Administered 2024-01-21 – 2024-02-02 (×24): 200 mg via ORAL
  Filled 2024-01-21 (×25): qty 1

## 2024-01-21 MED ORDER — SODIUM CHLORIDE 0.9 % IV SOLN: INTRAVENOUS | Status: AC

## 2024-01-21 MED ORDER — ALPRAZOLAM 1 MG PO TABS: 1.0000 mg | ORAL_TABLET | Freq: Two times a day (BID) | ORAL | Status: DC | PRN

## 2024-01-21 MED ORDER — FENTANYL CITRATE (PF) 100 MCG/2ML IJ SOLN
INTRAMUSCULAR | Status: AC
  Filled 2024-01-21: qty 2

## 2024-01-21 MED ORDER — ACETAMINOPHEN 325 MG PO TABS: 650.0000 mg | ORAL_TABLET | ORAL | Status: DC | PRN

## 2024-01-21 MED ORDER — IODIXANOL 320 MG/ML IV SOLN
INTRAVENOUS | Status: DC | PRN
  Administered 2024-01-21: 60 mL

## 2024-01-21 MED ORDER — SODIUM CHLORIDE 0.9% FLUSH
3.0000 mL | Freq: Two times a day (BID) | INTRAVENOUS | Status: DC
  Administered 2024-01-21 (×2): 3 mL via INTRAVENOUS

## 2024-01-21 MED ORDER — CHLORHEXIDINE GLUCONATE CLOTH 2 % EX PADS
6.0000 | MEDICATED_PAD | Freq: Every day | CUTANEOUS | Status: DC
  Administered 2024-01-21 – 2024-01-25 (×5): 6 via TOPICAL

## 2024-01-21 MED ORDER — SODIUM CHLORIDE 0.9 % IV SOLN: 250.0000 mL | INTRAVENOUS | Status: AC | PRN

## 2024-01-21 MED ORDER — PANTOPRAZOLE SODIUM 40 MG IV SOLR
40.0000 mg | Freq: Every day | INTRAVENOUS | Status: DC
  Administered 2024-01-21 – 2024-01-24 (×4): 40 mg via INTRAVENOUS
  Filled 2024-01-21 (×4): qty 10

## 2024-01-21 MED ORDER — FENTANYL CITRATE (PF) 100 MCG/2ML IJ SOLN
INTRAMUSCULAR | Status: DC | PRN
  Administered 2024-01-21: 50 ug via INTRAVENOUS

## 2024-01-21 MED ORDER — HEPARIN SODIUM (PORCINE) 1000 UNIT/ML IJ SOLN
INTRAMUSCULAR | Status: DC | PRN
  Administered 2024-01-21: 5000 [IU] via INTRAVENOUS

## 2024-01-21 MED ORDER — HYDRALAZINE HCL 20 MG/ML IJ SOLN: 5.0000 mg | INTRAMUSCULAR | Status: DC | PRN

## 2024-01-21 MED ORDER — HEPARIN BOLUS VIA INFUSION
1650.0000 [IU] | Freq: Once | INTRAVENOUS | Status: AC
Start: 2024-01-21 — End: 2024-01-21
  Administered 2024-01-21: 1650 [IU] via INTRAVENOUS
  Filled 2024-01-21: qty 1650

## 2024-01-21 MED ORDER — LACTATED RINGERS IV BOLUS
1000.0000 mL | Freq: Once | INTRAVENOUS | Status: AC
Start: 2024-01-21 — End: 2024-01-22
  Administered 2024-01-21: 1000 mL via INTRAVENOUS

## 2024-01-21 SURGICAL SUPPLY — 23 items
BALLOON JADE .014 3.5 X 40 (BALLOONS) IMPLANT
BALLOON JADE .014 4.0 X 40 0 (BALLOONS) IMPLANT
BALLOON LUTONIX 6X120X130 (BALLOONS) IMPLANT
CATH BEACON 5 .038 100 VERT TP (CATHETERS) IMPLANT
CATH CXI SUPP 2.6F 150 ST (CATHETERS) IMPLANT
CATH MICROCATH PRGRT 2.8F 110 (CATHETERS) IMPLANT
COIL 400 COMPLEX SOFT 10X35CM (Vascular Products) IMPLANT
COIL 400 COMPLEX STD 10X35CM (Vascular Products) IMPLANT
COVER PROBE ULTRASOUND 5X96 (MISCELLANEOUS) IMPLANT
DEVICE PRESTO INFLATION (MISCELLANEOUS) IMPLANT
DEVICE STARCLOSE SE CLOSURE (Vascular Products) IMPLANT
DEVICE TORQUE (MISCELLANEOUS) IMPLANT
GOWN STRL REUS W/ TWL LRG LVL3 (GOWN DISPOSABLE) ×1 IMPLANT
GUIDEWIRE ANGLED .035 180CM (WIRE) IMPLANT
GUIDEWIRE PFTE-COATED .018X300 (WIRE) IMPLANT
HANDLE DETACHMENT COIL (MISCELLANEOUS) IMPLANT
PACK ANGIOGRAPHY (CUSTOM PROCEDURE TRAY) ×1 IMPLANT
STENT ESPRIT BTK 3.5X38 SCAFF (Permanent Stent) IMPLANT
STENT ESPRIT BTK 3.7X38 SCAFF (Permanent Stent) IMPLANT
STENT LIFESTENT 5F 6X100X135 (Permanent Stent) IMPLANT
WIRE COMMAND ST STR 014 300 (WIRE) IMPLANT
WIRE G V18X300CM (WIRE) IMPLANT
WIRE J 3MM .035X145CM (WIRE) IMPLANT

## 2024-01-21 NOTE — Plan of Care (Signed)
  Problem: Health Behavior/Discharge Planning: Goal: Ability to manage health-related needs will improve Outcome: Progressing   Problem: Clinical Measurements: Goal: Will remain free from infection Outcome: Progressing Goal: Diagnostic test results will improve Outcome: Progressing   Problem: Activity: Goal: Risk for activity intolerance will decrease Outcome: Progressing   Problem: Nutrition: Goal: Adequate nutrition will be maintained Outcome: Progressing   Problem: Coping: Goal: Level of anxiety will decrease Outcome: Progressing   Problem: Safety: Goal: Ability to remain free from injury will improve Outcome: Progressing   Problem: Skin Integrity: Goal: Risk for impaired skin integrity will decrease Outcome: Progressing

## 2024-01-21 NOTE — Progress Notes (Signed)
 NAME:  Halana Deisher, MRN:  [id number], DOB:  [birth year], LOS: 1 ADMISSION DATE:  01/20/2024, CONSULTATION DATE:  01/20/24 REFERRING MD:  Dr. 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 She returned for outpatient follow up after left common femoral artery to posterior tibial artery bypass in October 2024 with complaints of worsening claudication symptoms as well as numbness and pain at night when she lies down. At outpatient follow up on 4/15 there were no open wounds noted but studies showed that the graft had occluded just past the region with some diminished monophasic flow in the distal posterior tibial artery.  She was instructed to present on 4/22 for scheduled angiography of the LLE with hope for limb salvage.  Hospital course: She underwent LLE angiography with angioplasty and stent placement left SFA and popliteal artery, angioplasty of the left posterior tibial artery, thrombectomy of left SFA, popliteal/ tibioperoneal trunk/posterior tibial. Thrombolysis was initiated with TPA and heparin  to continue overnight. Plans for vascular intervention on 01/21/24. Overnight patient having severe pain and developed new onset A-fib RVR. Patient asymptomatic bedside, only complaints are some anxiety over current clinical condition and outcomes and severe pain.   Significant labs: (Labs/ Imaging personally reviewed) I, Recardo Canal Rust-Chester, AGACNP-BC, personally viewed and interpreted this ECG. EKG Interpretation: Date: 01/20/24, EKG Time: 22:41, Rate: 132, Rhythm: A-fib RVR, QRS Axis:  RAD, Intervals: normal, ST/T Wave abnormalities: ST depression inferior and leads v2-v6, Narrative Interpretation: A-fib RVR with signs of ischemia Chemistry: Na+:137, K+: 3.2, BUN/Cr.: 21/ 0.52, Serum CO2/ AG: 19/ 11, Mg: 1.5 Hematology: WBC: 6.2,  Hgb: 11.7,   PCCM consulted for assistance in management and monitoring due to new onset A-fib RVR in the setting of severe reperfusion pain.  Pertinent  Medical History  Aortic Stenosis CAD HFpEF HLD HPV HLD Hypothyroidism Macrocytic anemia PAD Polymyalgia rheumatica  Significant Hospital Events: Including procedures, antibiotic start and stop dates in addition to other pertinent events   01/20/24: Admit to ICU by vascular surgery with ischemic LLE s/p angiogram, stent placement and lytic therapy overnight. New onset A-fib RVR, PCCM consulted 01/21/24- patient is improved, electrolytes better. Adequate UOP today.  Atrial fibrillation is rate controlled.   Interim History / Subjective:  Patient alert and responsive, anxious and in severe pain. Plan of care discussed, all questions and concerns answered at this time.  Objective   Blood pressure (!) 104/90, pulse 95, temperature 97.8 F (36.6 C), temperature source Oral, resp. rate 18, height 5\' 8"  (1.727 m), weight 66 kg, SpO2 (!) 80%.        Intake/Output Summary (Last 24 hours) at 01/21/2024 1010 Last data filed at 01/21/2024 0441 Gross per 24 hour  Intake 799.2 ml  Output 475 ml  Net 324.2 ml   Filed Weights   01/20/24 1433  Weight: 66 kg    Examination: General: Adult female, acutely ill, lying in bed, NAD HEENT: MM pink/moist, anicteric, atraumatic, neck supple Neuro: A&O x 4, able to follow commands, PERRL +3, MAE CV: s1s2 irregular, A-fib RVR on monitor, no r/m/g Pulm: Regular, non labored on RA, breath sounds clear-BUL & clear/ diminished-BLL GI: soft, rounded, non tender, bs x 4 GU: foley in place with clear yellow urine Skin: oozing noted at R fem site- unable to clearly see site due to pressure dressing- no rashes/lesions noted Extremities:  warm/dry, pulses + 1 R/P(R side), L side absent pedal, doppler tibial +1 radial, no edema noted  Resolved Hospital Problem list     Assessment & Plan:  New Onset  Atrial Fibrillation with Rapid Ventricular Response- now rate controlled  ST depression inferior & leads V2-V6 CHA2DS2-VASc score: 5 points - STAT BMP, Mg, Phos. Recent H&H stable - 1 L LR bolus ordered, will add one time dilaudid  dose with valium  IV to see if pain can be better controlled - consider Lopressor  IVP vs amiodarone  bolus - Heparin  drip already running with alteplase  overnight with some oozing at site, discussed with vascular, will hold off titrating heparin  drip until the AM - Trend troponin, f/u TSH & thyroid  panel - Echocardiogram ordered - f/u EKG in AM - Consider cardiology consultation depending on w/u above - Continuous cardiac monitoring  Hypokalemia Hypomagnesemia - Daily BMP, replace electrolytes PRN - 4 g of Mg & 60 meq K+ ordered  PAD Left Ischemic Leg - vascular managing - continue lytics and heparin  overnight - monitor oozing, CBC & fibrinogen    Best Practice (right click and "Reselect all SmartList Selections" daily)  Diet/type: clear liquids DVT prophylaxis systemic heparin  Pressure ulcer(s): N/A GI prophylaxis: N/A Lines: N/A Foley:  Yes, and it is still needed Code Status:  full code Last date of multidisciplinary goals of care discussion [per primary 01/20/24]  Labs   CBC: Recent Labs  Lab 01/20/24 2055 01/21/24 0123 01/21/24 0730  WBC 6.2 9.8 8.3  HGB 11.7* 10.9* 9.4*  HCT 34.5* 32.7* 28.7*  MCV 98.0 99.4 100.3*  PLT 166 155 156    Basic Metabolic Panel: Recent Labs  Lab 01/20/24 1425 01/20/24 2226 01/21/24 0730  NA  --  137 133*  K  --  3.2* 4.6  CL  --  107 105  CO2  --  19* 22  GLUCOSE  --  109* 117*  BUN 23 21 20   CREATININE 0.71 0.52 0.74  CALCIUM   --  8.7* 8.2*  MG  --  1.5* 2.3  PHOS  --  3.0 2.8   GFR: Estimated Creatinine Clearance: 55.6 mL/min (by C-G formula based on SCr of 0.74 mg/dL). Recent Labs  Lab 01/20/24 2055 01/21/24 0123 01/21/24 0730  WBC 6.2 9.8 8.3    Liver Function Tests: Recent Labs   Lab 01/20/24 2226  AST 34  ALT 23  ALKPHOS 44  BILITOT 1.1  PROT 6.5  ALBUMIN 3.5   No results for input(s): "LIPASE", "AMYLASE" in the last 168 hours. No results for input(s): "AMMONIA" in the last 168 hours.  ABG No results found for: "PHART", "PCO2ART", "PO2ART", "HCO3", "TCO2", "ACIDBASEDEF", "O2SAT"   Coagulation Profile: No results for input(s): "INR", "PROTIME" in the last 168 hours.  Cardiac Enzymes: No results for input(s): "CKTOTAL", "CKMB", "CKMBINDEX", "TROPONINI" in the last 168 hours.  HbA1C: No results found for: "HGBA1C"  CBG: Recent Labs  Lab 01/20/24 2048  GLUCAP 77    Review of Systems: positives in BOLD  Gen: Denies fever, chills, weight change, fatigue, night sweats, bilateral lower extremity pain/ severe pain Left foot and ankle HEENT: Denies blurred vision, double vision, hearing loss, tinnitus, sinus congestion, rhinorrhea, sore throat, neck stiffness, dysphagia PULM: Denies shortness of breath, cough, sputum production, hemoptysis, wheezing CV: Denies chest pain, edema, orthopnea, paroxysmal nocturnal dyspnea, palpitations GI: Denies abdominal pain, nausea, vomiting, diarrhea, hematochezia, melena, constipation, change in bowel habits GU: Denies dysuria, hematuria, polyuria, oliguria, urethral discharge Endocrine: Denies hot or cold intolerance, polyuria,  polyphagia or appetite change Derm: Denies rash, dry skin, scaling or peeling skin change Heme: Denies easy bruising, bleeding, bleeding gums Neuro: Denies headache, numbness, weakness, slurred speech, loss of memory or consciousness  Past Medical History:  She,  has a past medical history of Aortic atherosclerosis (HCC), Aortic stenosis (06/26/2023), Arthritis, 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   Surgical History:   Past Surgical History:  Procedure Laterality Date   COLONOSCOPY WITH PROPOFOL  N/A 10/04/2020   Procedure: COLONOSCOPY WITH PROPOFOL ;  Surgeon: Toledo, Alphonsus Jeans, MD;  Location: [facility] ENDOSCOPY;  Service: Gastroenterology;  Laterality: N/A;   COLPOSCOPY     CORONARY STENT INTERVENTION N/A 11/15/2020   Procedure: CORONARY STENT INTERVENTION;  Surgeon: Antonette Batters, MD;  Location: [facility] INVASIVE CV LAB;  Service: Cardiovascular;  Laterality: N/A;   FEMORAL-POPLITEAL BYPASS GRAFT Left 07/16/2023   Procedure: BYPASS GRAFT FEMORAL-POPLITEAL ARTERY (FEMORAL-POSTERIOR TIBIAL BYPASS W/ SAPHENOUS VEIN- [id number]);  Surgeon: Jackquelyn Mass, MD;  Location: [facility] ORS;  Service: Vascular;  Laterality: Left;   KNEE ARTHROSCOPY Left    LAPAROSCOPIC HYSTERECTOMY     LEFT HEART CATH AND CORONARY ANGIOGRAPHY N/A 11/15/2020   Procedure: LEFT HEART CATH AND CORONARY ANGIOGRAPHY;  Surgeon: Antonette Batters, MD;  Location: [facility] CV [facility];  Service: Cardiovascular;  Laterality: N/A;   LOWER EXTREMITY ANGIOGRAPHY Left 09/05/2020   Procedure: LOWER EXTREMITY ANGIOGRAPHY;  Surgeon: Jackquelyn Mass, MD;  Location: [facility];  Service: Cardiovascular;  Laterality: Left;   LOWER EXTREMITY ANGIOGRAPHY Left 04/10/2021   Procedure: LOWER EXTREMITY ANGIOGRAPHY;  Surgeon: Jackquelyn Mass, MD;  Location: [facility];  Service: Cardiovascular;  Laterality: Left;   LOWER EXTREMITY ANGIOGRAPHY Left 11/20/2021   Procedure: Lower  Extremity Angiography;  Surgeon: Jackquelyn Mass, MD;  Location: [facility];  Service: Cardiovascular;  Laterality: Left;   LOWER EXTREMITY ANGIOGRAPHY Left 06/05/2022   Procedure: Lower Extremity Angiography;  Surgeon: Jackquelyn Mass, MD;  Location: [facility];  Service: Cardiovascular;  Laterality: Left;   LOWER EXTREMITY ANGIOGRAPHY Left 07/23/2022   Procedure: Lower Extremity Angiography;  Surgeon: Jackquelyn Mass, MD;  Location: [facility];  Service: Cardiovascular;  Laterality: Left;   LOWER EXTREMITY ANGIOGRAPHY Left 12/27/2022   Procedure: Lower Extremity Angiography;  Surgeon: Jackquelyn Mass, MD;  Location: [facility];  Service: Cardiovascular;  Laterality: Left;   LOWER EXTREMITY ANGIOGRAPHY Left 06/17/2023   Procedure: Lower Extremity Angiography;  Surgeon: Jackquelyn Mass, MD;  Location: [facility];  Service: Cardiovascular;  Laterality: Left;   LOWER EXTREMITY ANGIOGRAPHY Left 01/20/2024   Procedure: Lower Extremity Angiography;  Surgeon: Jackquelyn Mass, MD;  Location: [facility];  Service: Cardiovascular;  Laterality: Left;   LOWER EXTREMITY INTERVENTION Left 01/20/2024   Procedure: LOWER EXTREMITY INTERVENTION;  Surgeon: Jackquelyn Mass, MD;  Location: [facility];  Service: Cardiovascular;  Laterality: Left;   TOTAL ABDOMINAL HYSTERECTOMY       Social History:   reports  that she has never smoked. She has never used smokeless tobacco. She reports current alcohol  use of about 1.0 standard drink of alcohol  per week. She reports that she does not use drugs.   Family History:  Her family history includes Cancer in her mother; Heart attack (age of onset: 78) in her father; Heart disease in her father; Heart failure in her mother; Pancreatic cancer in her mother.   Allergies Allergies  Allergen Reactions   Lactose Other (See Comments) and Nausea And Vomiting    Gi upset and diarrhea  Other  reaction(s): Other (See Comments)  Gi upset and diarrhea  Other reaction(s): Other (See Comments) Gi upset and diarrhea Gi upset and diarrhea Gi upset and diarrhea    Gi upset and diarrhea    Gi upset and diarrhea Gi upset and diarrhea Gi upset and diarrhea  Gi upset and diarrhea    Gi upset and diarrhea  Other reaction(s): Other (See Comments) Gi upset and diarrhea Gi upset and diarrhea Gi upset and diarrhea  Gi upset and diarrhea  Gi upset and diarrhea Gi upset and diarrhea Gi upset and diarrhea     Home Medications  Prior to Admission medications   Medication Sig Start Date End Date Taking? Authorizing Provider  aspirin  EC 81 MG tablet Take 81 mg by mouth every evening. Swallow whole.   Yes [provider]  B Complex Vitamins (VITAMIN B-COMPLEX) TABS Take 1 tablet by mouth daily.   Yes [provider]  budesonide  (ENTOCORT EC ) 3 MG 24 hr capsule Take 3 capsules (9 mg total) by mouth every morning. 09/26/23  Yes Marnee Sink, MD  diphenoxylate -atropine  (LOMOTIL ) 2.5-0.025 MG tablet TAKE 1 TABLET BY MOUTH 3 TIMES DAILY AS NEEDED FOR DIARRHEA. 09/12/21  Yes Vanga, Rohini Reddy, MD  levothyroxine  (SYNTHROID ) 75 MCG tablet Take 75 mcg by mouth daily before breakfast.  02/19/19  Yes [provider]  losartan  (COZAAR ) 25 MG tablet Take 25 mg by mouth daily. 10/16/21  Yes [provider]  rosuvastatin (CRESTOR) 5 MG tablet Take 5 mg by mouth daily. 06/23/23 06/22/24 Yes [provider]  ticagrelor  (BRILINTA ) 90 MG TABS tablet TAKE ONE TABLET BY MOUTH TWICE DAILY 12/08/23  Yes Brown, Fallon E, NP  budesonide  (ENTOCORT EC ) 3 MG 24 hr capsule Take 9 mg by mouth daily. 11/12/21   [provider]  estradiol  (ESTRACE ) 0.1 MG/GM vaginal cream Insert 1/4 applicator full into vagina at night twice a week 10/21/22   Nelda Balsam, NP  HYDROcodone -acetaminophen  (NORCO) 10-325 MG tablet Take 1 tablet by mouth every 6 (six) hours as needed for severe pain  (pain score 7-10). 08/21/23   Schnier, Ninette Basque, MD  nitroGLYCERIN  (NITROSTAT ) 0.4 MG SL tablet Place under the tongue. Patient not taking: Reported on 07/16/2023 10/04/21   [provider]     Critical care provider statement:   Total critical care time: 33 minutes   Performed by: Jaclynn Mast MD   Critical care time was exclusive of separately billable procedures and treating other patients.   Critical care was necessary to treat or prevent imminent or life-threatening deterioration.   Critical care was time spent personally by me on the following activities: development of treatment plan with patient and/or surrogate as well as nursing, discussions with consultants, evaluation of patient's response to treatment, examination of patient, obtaining history from patient or surrogate, ordering and performing treatments and interventions, ordering and review of laboratory studies, ordering and review of radiographic studies, pulse oximetry and  re-evaluation of patient's condition.    Gabrien Mentink, M.D.  Pulmonary & Critical Care Medicine

## 2024-01-21 NOTE — Telephone Encounter (Signed)
 Patient Product/process development scientist completed.    The patient is insured through [facility]. Patient has Medicare and is not eligible for a copay card, but may be able to apply for patient assistance or Medicare RX Payment Plan (Patient Must reach out to their plan, if eligible for payment plan), if available.    Ran test claim for Eliquis  5 mg and the current 30 day co-pay is $103.59.   This test claim was processed through [facility]- copay amounts may vary at other pharmacies due to pharmacy/plan contracts, or as the patient moves through the different stages of their insurance plan.     Morgan Arab, CPHT Pharmacy Technician III Certified Patient Advocate [facility] Patient Advocate Team Direct Number: [phone]  Fax: [phone]

## 2024-01-21 NOTE — Progress Notes (Signed)
 PHARMACY - ANTICOAGULATION CONSULT NOTE  Pharmacy Consult for heparin  infusion Indication: atrial fibrillation  Allergies  Allergen Reactions   Lactose Other (See Comments) and Nausea And Vomiting    Gi upset and diarrhea  Other reaction(s): Other (See Comments)  Gi upset and diarrhea  Other reaction(s): Other (See Comments) Gi upset and diarrhea Gi upset and diarrhea Gi upset and diarrhea    Gi upset and diarrhea    Gi upset and diarrhea Gi upset and diarrhea Gi upset and diarrhea  Gi upset and diarrhea    Gi upset and diarrhea  Other reaction(s): Other (See Comments) Gi upset and diarrhea Gi upset and diarrhea Gi upset and diarrhea  Gi upset and diarrhea  Gi upset and diarrhea Gi upset and diarrhea Gi upset and diarrhea    Patient Measurements: Height: 5\' 8"  (172.7 cm) Weight: 66 kg (145 lb 6.4 oz) IBW/kg (Calculated) : 63.9 HEPARIN  DW (KG): 66  Vital Signs: Temp: 97.8 F (36.6 C) (04/23 0735) Temp Source: Oral (04/23 0735) BP: 104/90 (04/23 0901) Pulse Rate: 95 (04/23 0901)  Labs: Recent Labs    01/20/24 1425 01/20/24 2055 01/20/24 2055 01/20/24 2226 01/21/24 0123 01/21/24 0510 01/21/24 0730 01/21/24 0734  HGB  --  11.7*   < >  --  10.9*  --  9.4*  --   HCT  --  34.5*  --   --  32.7*  --  28.7*  --   PLT  --  166  --   --  155  --  156  --   HEPARINUNFRC  --  0.41  --   --  0.13*  --   --   --   CREATININE 0.71  --   --  0.52  --   --  0.74  --   TROPONINIHS  --   --    < > 12 166* 653*  --  866*   < > = values in this interval not displayed.    Estimated Creatinine Clearance: 55.6 mL/min (by C-G formula based on SCr of 0.74 mg/dL).   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)    a.) multiple vascular  interventions (PTA, stenting, arthrectomy, and thrombectomy) of LEFT SFA, LEFT popliteal, LEFT posterior tibial, and LEFT anterior tibial arteries due to critical limb ischemia   CAD (coronary artery disease) 11/09/2020   a.) cCTA 11/09/2020: Ca2+ 539 (83rd %'ile for age/sex/race match control); <25% dRCA & pLCx, >70% pLAD; b.) LHC/PCI 11/15/2020: 90% pLAD (2.75 x 18 mm Resolute Onyx DES); c.) MV 06/26/2023: no sig ischemia   Cervical spondylosis    Colitis    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)    a.) s/p multiple vascular interventions   Diastolic dysfunction 06/26/2023   a.) TTE 06/26/2023: EF 60-65%, no RWMAs, G2DD, sev AoV thickening with mild AR, mild-mod AS (MPG 18.2 mmHg; AVA 0.73 cm2)   [place]   Dyslipidemia    History of cervical cancer    a.) s/p TAH   HPV (human papilloma virus) infection    Hyperlipidemia    Hypothyroidism    IGT (impaired glucose tolerance)    Left upper lobe pulmonary nodule  Long term current use of aspirin     Long term current use of ticagrelor  therapy    Macrocytic anemia    Osteoporosis    a.) Tx'd with zolodronic acid (Reclast) infusions   PAD (peripheral artery disease) (HCC)    Polymyalgia rheumatica (HCC)    Skin cancer of nose    Transaminitis    Vaginal dysplasia     Medications:  Scheduled:   [MAR Hold] diazepam   5 mg Intravenous Once   [MAR Hold] levothyroxine   75 mcg Oral QAC breakfast   [MAR Hold] losartan   25 mg Oral Daily   [MAR Hold] sodium chloride  flush  3 mL Intravenous Q12H    Assessment: 83 yo F presenting to [facility] for scheduled vascular surgery due to 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 s/p angiogram & thrombolysis with tPA and heparin  overnight. She developed new onset A-fib RVR overnight and is being started on IV heparin . We have an order to start IV heparin  with 1/2 initial bolus  Goal of Therapy:  Heparin  level  0.3-0.7 units/ml Monitor platelets by anticoagulation protocol: Yes   Plan:  Give 1650 units bolus x 1 Start heparin  infusion at 1000 units/hr Check anti-Xa level in 8 hours and daily while on heparin  Continue to monitor H&H and platelets  Adalberto Acton 01/21/2024,9:22 AM

## 2024-01-21 NOTE — Progress Notes (Signed)
*  PRELIMINARY RESULTS* Echocardiogram 2D Echocardiogram has been performed.  Melanie Browning 01/21/2024, 2:55 PM

## 2024-01-21 NOTE — Interval H&P Note (Signed)
 History and Physical Interval Note:  01/21/2024 7:51 AM  Melanie Browning  has presented today for surgery, with the diagnosis of Left leg lysis pt 2.  The various methods of treatment have been discussed with the patient and family. After consideration of risks, benefits and other options for treatment, the patient has consented to  Procedure(s): Lower Extremity Angiography (Left) as a surgical intervention.  The patient's history has been reviewed, patient examined, no change in status, stable for surgery.  I have reviewed the patient's chart and labs.  Questions were answered to the patient's satisfaction.     [name]

## 2024-01-21 NOTE — Op Note (Signed)
 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 Return to special procedures for lysis recheck  Post-operative diagnosis:  Same  Procedure(s) Performed:  1.  Left lower extremity angiography third order catheter placement via existing 6 French sheath  2.  Percutaneous transluminal angioplasty and stent placement left SFA to 6 mm  3.  Percutaneous transluminal angioplasty and stent placement left tibioperoneal trunk and proximal posterior tibial artery with Esprit stents dilated to 3.5 mm distally and 4 mm proximally  4.  Introduction catheter into the venous branch of the failed saphenous vein bypass  5.  Coil embolization of the venous branch of the failed saphenous vein bypass using Ruby coils  6.  StarClose right common femoral    Anesthesia: Conscious sedation was administered by the interventional radiology RN under my direct supervision. IV Versed  plus fentanyl  were utilized. Continuous ECG, pulse oximetry and blood pressure was monitored throughout the entire procedure. Conscious sedation was administered for a total of 1 hour 2 minutes.  Sheath: Existing 6 [place] destination sheath right common femoral retrograde  Contrast: 60 cc   Fluoroscopy Time: 12 minutes  Indications: Patient has undergone lysis overnight she is returning to reevaluate her left lower extremity arterial perfusion and correct any residual abnormalities.  Procedure:  Melanie Browning a 83 y.o. female who was identified and appropriate procedural time out was performed.  The patient was then placed supine on the table and prepped and draped in the usual sterile fashion.    A V18 wire was then advanced through the infusion catheter and the catheter removed.  Hand-injection of contrast  was then performed through the destination sheath.  Interpretation: The common femoral profunda femoris remained widely patent free of abnormality.  The proximal segment of the saphenous vein bypass is also patent which terminates in the mid thigh and a large branch with extensive venous outflow.  This appears to be compromising arterial perfusion secondary to the low resistance and numerous venous branches.  The superficial femoral and popliteal arteries are patent.  The stent placed yesterday at the origin is widely patent the stent placed yesterday at the leading edge of the older stents is widely patent.  The segment in between these 2 stents which is approximately 80 mm in length demonstrates 2 focal abnormalities of greater than 60% stenosis.  The previously placed distal SFA popliteal stents are now widely patent.  Distal to the popliteal stent which essentially is the tibioperoneal trunk there is greater than 60% stenosis this extends into the posterior tibial which remains patent but has extensive disease in its proximal segment.  The area of the posterior tibial observed yesterday where the vein bypass anastomosis was performed is now widely patent (yesterday there appeared to be 2 string signs proximal and distal to the anastomotic area that I was suspicious was a clamp injury, today the posterior tibial artery appears to be widely patent through this anastomotic area.  Distally it remains widely patent filling the plantar arteries.  There is now visualization of the peroneal artery as well although its proximal 5 cm is a subtotal occlusion.  The anterior tibial is now patent in its proximal portion and has sluggish flow to its midportion.  It appears to occlude in its midportion.  The posterior tibial remains the dominant outflow.  A 6 mm x  100 mm life stent is then deployed bridging the proximal stent in the more distal stents in the SFA.  Is postdilated with a 6 mm x 120 mm Lutonix drug-eluting  balloon inflated to 6 atm for approximately 1 minute.  Follow-up imaging demonstrates less than 10% residual stenosis.  Attention is then turned to the trifurcation.  Magnified images obtained.  A 3.5 mm x 38 mm prestent is then deployed into the posterior tibial extending it up into the distal tibioperoneal trunk.  A second 3.75 x 38 mm Esprit stent is then deployed bridging this posterior tibial stent up into the popliteal completely covering the tibioperoneal trunk.  The first Esprit stent is postdilated with a 3.5 mm Jade balloon inflated to 10 atm.  The second more proximal Esprit stent is postdilated with a 4 mm Jade balloon inflated to 10 atm.  Follow-up imaging now demonstrates rapid flow of contrast through the femoral-popliteal into the tibioperoneal trunk and posterior tibial.  There is preservation of the distal peroneal as well.  There is also preservation of the origin of the anterior tibial.  Distal runoff is repeated and noted to be preserved.  Attention is then turned to the vein bypass and the branch with extensive collaterals that appears to be stealing flow from the arterial circulation given the low resistance venous outflow.  A Kumpe catheter was negotiated into the distal segment of the patent saphenous vein bypass and a Progreat catheter is negotiated into this large branch with all the collaterals.  Hand-injection contrast is then utilized to demonstrate the position.  2 coils are then deployed into the system a 10 mm x 35 cm standard coil followed by 10 mm x 35 cm soft coil.  Follow-up imaging now demonstrates near total elimination of any venous outflow.  As anticipated with injection of contrast through the sheath located in the common femoral artery there is now a tremendous improvement in forward flow and distal perfusion.  The sheath is then pulled into the right common femoral oblique view was obtained and a StarClose device is deployed.  Findings:   As dictated in the note  above successful recanalization of the arterial system with stent placement in the superficial femoral artery as well as Esprit stent placement in the posterior tibial and tibioperoneal trunk.  Also successful ablation of the venous outflow stealing blood from the arterial circuit.   Disposition: Patient was taken to the recovery room in stable condition having tolerated the procedure well.  Melanie Browning 01/21/2024,9:12 AM

## 2024-01-21 NOTE — Progress Notes (Addendum)
 1132 RN notified Penne Bowl NP, pt BP is 86/74 with map 59. LR bolus was order.    1310 RN notified Penne Bowl NP, pt states her code status is DNR, but is listed as full code in the system and pt wants to be change to DNR.

## 2024-01-21 NOTE — Progress Notes (Signed)
 PHARMACY CONSULT NOTE - FOLLOW UP  Pharmacy Consult for Electrolyte Monitoring and Replacement   Recent Labs: Potassium (mmol/L)  Date Value  01/21/2024 4.6   Magnesium  (mg/dL)  Date Value  09/81/1914 2.3   Calcium  (mg/dL)  Date Value  78/29/5621 8.2 (L)   Albumin (g/dL)  Date Value  30/86/5784 3.5  06/17/2023 4.5   Phosphorus (mg/dL)  Date Value  69/62/9528 2.8   Sodium (mmol/L)  Date Value  01/21/2024 133 (L)     Assessment: 83 yo F presenting to [facility] for scheduled vascular surgery due to 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 She developed new onset A-fib RVR overnight. Pharmacy is asked to follow and replace electrolytes while in CCU   Goal of Therapy:  Potassium 4.0 - 5.1 mmol/L Magnesium  2.0 - 2.4 mg/dL All Other Electrolytes WNL  Plan:  ---no electrolyte replacement warranted for today ---recheck electrolytes in am  Adalberto Acton ,PharmD Clinical Pharmacist 01/21/2024 10:09 AM

## 2024-01-21 NOTE — Consult Note (Signed)
 Cardiology Consultation   Patient ID: Melanie Browning MRN: [id number]; DOB: 22-Sep-1941  Admit date: 01/20/2024 Date of Consult: 01/21/2024  PCP:  Melanie Gold, MD    HeartCare Providers Cardiologist:  None        Patient Profile:   Melanie Browning is a 83 y.o. female with a hx of PVD with critical limb ischemia requring intervention, claudication, CAD s/p PCI to LAD 10/2020, hypertension, hyperlipidemia, and hypothyroidism who is being seen 01/21/2024 for the evaluation of new onset atrial fibrillation at the request of Dr/ Schnier.  History of Present Illness:   Ms. Melanie Browning presented to [facility] 4/22 for scheduled vascular surgery for LLE angiography with hope for limb salvage. She underwent left lower extremity angiography with angioplasty and stent placement in the left SFA and popliteal artery, angioplasty of the left posterior tibial artery, thrombectomy of the left SFA, pop popliteal/tibioperoneal trunk/posterior tibial. Thrombolysis was initiated with tPA and heparin  to continue overnight. Overnight on 4/22, new onset of atrial fibrillation with RVR was started on telemetry. Patient was symptomatic reporting feeling anxious and shaking. Patient was giving IV amiodarone  with conversion to sinus rhythm. Cardiology was asked to consult for new onset atrial fibrillation. On exam, patient remains in sinus rhythm at this time.  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)    a.) multiple vascular interventions (PTA, stenting, arthrectomy, and thrombectomy) of LEFT SFA, LEFT popliteal, LEFT posterior tibial, and LEFT anterior tibial arteries due to critical limb ischemia   CAD (coronary artery disease) 11/09/2020   a.) cCTA 11/09/2020: Ca2+ 539 (83rd %'ile  for age/sex/race match control); <25% dRCA & pLCx, >70% pLAD; b.) LHC/PCI 11/15/2020: 90% pLAD (2.75 x 18 mm Resolute Onyx DES); c.) MV 06/26/2023: no sig ischemia   Cervical spondylosis    Colitis    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)    a.) s/p multiple vascular interventions   Diastolic dysfunction 06/26/2023   a.) TTE 06/26/2023: EF 60-65%, no RWMAs, G2DD, sev AoV thickening with mild AR, mild-mod AS (MPG 18.2 mmHg; AVA 0.73 cm2)   [place]   Dyslipidemia    History of cervical cancer    a.) s/p TAH   HPV (human papilloma virus) infection    Hyperlipidemia    Hypothyroidism    IGT (impaired glucose tolerance)    Left upper lobe pulmonary nodule    Long term current use of aspirin     Long term current use of ticagrelor  therapy    Macrocytic anemia    Osteoporosis    a.) Tx'd with zolodronic acid (Reclast) infusions   PAD (peripheral artery disease) (HCC)    Polymyalgia rheumatica (HCC)    Skin cancer of nose    Transaminitis    Vaginal dysplasia     Past Surgical History:  Procedure Laterality Date   COLONOSCOPY WITH PROPOFOL  N/A 10/04/2020   Procedure: COLONOSCOPY WITH PROPOFOL ;  Surgeon: Toledo, Alphonsus Jeans, MD;  Location: [facility] ENDOSCOPY;  Service: Gastroenterology;  Laterality: N/A;   COLPOSCOPY     CORONARY STENT INTERVENTION N/A 11/15/2020   Procedure: CORONARY STENT INTERVENTION;  Surgeon: Antonette Batters, MD;  Location: [facility] INVASIVE CV LAB;  Service: Cardiovascular;  Laterality: N/A;  FEMORAL-POPLITEAL BYPASS GRAFT Left 07/16/2023   Procedure: BYPASS GRAFT FEMORAL-POPLITEAL ARTERY (FEMORAL-POSTERIOR TIBIAL BYPASS W/ SAPHENOUS VEIN- [id number]);  Surgeon: Jackquelyn Mass, MD;  Location: [facility] ORS;  Service: Vascular;  Laterality: Left;   KNEE ARTHROSCOPY Left    LAPAROSCOPIC HYSTERECTOMY     LEFT HEART CATH AND CORONARY ANGIOGRAPHY N/A 11/15/2020   Procedure: LEFT HEART CATH AND CORONARY ANGIOGRAPHY;  Surgeon:  Antonette Batters, MD;  Location: [facility] INVASIVE CV LAB;  Service: Cardiovascular;  Laterality: N/A;   LOWER EXTREMITY ANGIOGRAPHY Left 09/05/2020   Procedure: LOWER EXTREMITY ANGIOGRAPHY;  Surgeon: Jackquelyn Mass, MD;  Location: [facility] CV [facility];  Service: Cardiovascular;  Laterality: Left;   LOWER EXTREMITY ANGIOGRAPHY Left 04/10/2021   Procedure: LOWER EXTREMITY ANGIOGRAPHY;  Surgeon: Jackquelyn Mass, MD;  Location: [facility] CV [facility];  Service: Cardiovascular;  Laterality: Left;   LOWER EXTREMITY ANGIOGRAPHY Left 11/20/2021   Procedure: Lower Extremity Angiography;  Surgeon: Jackquelyn Mass, MD;  Location: [facility];  Service: Cardiovascular;  Laterality: Left;   LOWER EXTREMITY ANGIOGRAPHY Left 06/05/2022   Procedure: Lower Extremity Angiography;  Surgeon: Jackquelyn Mass, MD;  Location: [facility];  Service: Cardiovascular;  Laterality: Left;   LOWER EXTREMITY ANGIOGRAPHY Left 07/23/2022   Procedure: Lower Extremity Angiography;  Surgeon: Jackquelyn Mass, MD;  Location: [facility];  Service: Cardiovascular;  Laterality: Left;   LOWER EXTREMITY ANGIOGRAPHY Left 12/27/2022   Procedure: Lower Extremity Angiography;  Surgeon: Jackquelyn Mass, MD;  Location: [facility];  Service: Cardiovascular;  Laterality: Left;   LOWER EXTREMITY ANGIOGRAPHY Left 06/17/2023   Procedure: Lower Extremity Angiography;  Surgeon: Jackquelyn Mass, MD;  Location: [facility];  Service: Cardiovascular;  Laterality: Left;   LOWER EXTREMITY ANGIOGRAPHY Left 01/20/2024   Procedure: Lower Extremity Angiography;  Surgeon: Jackquelyn Mass, MD;  Location: [facility];  Service: Cardiovascular;  Laterality: Left;   LOWER EXTREMITY INTERVENTION Left 01/20/2024   Procedure: LOWER EXTREMITY INTERVENTION;  Surgeon: Jackquelyn Mass, MD;  Location: [facility];  Service: Cardiovascular;  Laterality: Left;   TOTAL ABDOMINAL  HYSTERECTOMY         Inpatient Medications: Scheduled Meds:  heparin   1,650 Units Intravenous Once   [MAR Hold] levothyroxine   75 mcg Oral QAC breakfast   [MAR Hold] losartan   25 mg Oral Daily   pantoprazole  (PROTONIX ) IV  40 mg Intravenous QHS   [MAR Hold] sodium chloride  flush  3 mL Intravenous Q12H   sodium chloride  flush  3 mL Intravenous Q12H   Continuous Infusions:  [MAR Hold] sodium chloride      sodium chloride  100 mL/hr at 01/21/24 1026   sodium chloride      heparin      lactated ringers  75 mL/hr at 01/21/24 0340   PRN Meds: [MAR Hold] sodium chloride , sodium chloride , acetaminophen , ALPRAZolam , [MAR Hold] diphenoxylate -atropine , [MAR Hold] hydrALAZINE , hydrALAZINE , HYDROmorphone  (DILAUDID ) injection, [MAR Hold] labetalol , [MAR Hold] midazolam , [MAR Hold] nitroGLYCERIN , [MAR Hold] ondansetron  (ZOFRAN ) IV, oxyCODONE , [MAR Hold] sodium chloride  flush, sodium chloride  flush  Allergies:    Allergies  Allergen Reactions   Lactose Other (See Comments) and Nausea And Vomiting    Gi upset and diarrhea  Other reaction(s): Other (See Comments)  Gi upset and diarrhea  Other reaction(s): Other (See Comments) Gi upset and diarrhea Gi upset and diarrhea Gi upset and diarrhea    Gi upset and diarrhea    Gi upset and diarrhea Gi upset and diarrhea  Gi upset and diarrhea  Gi upset and diarrhea    Gi upset and diarrhea  Other reaction(s): Other (See Comments) Gi upset and diarrhea Gi upset and diarrhea Gi upset and diarrhea  Gi upset and diarrhea  Gi upset and diarrhea Gi upset and diarrhea Gi upset and diarrhea    Social History:   Social History   Socioeconomic History   Marital status: Married    Spouse name: Norm    Number of children: 1   Years of education: Not on file   Highest education level: Not on file  Occupational History   Occupation: retired   Tobacco Use   Smoking status: Never   Smokeless tobacco: Never  Vaping Use   Vaping status: Never Used  Substance  and Sexual Activity   Alcohol  use: Yes    Alcohol /week: 1.0 standard drink of alcohol     Types: 1 Glasses of wine per week    Comment: 5   Drug use: Never   Sexual activity: Yes  Other Topics Concern   Not on file  Social History Narrative   Lives with spouse at [facility]    Social Drivers of Health   Financial Resource Strain: Low Risk  (11/06/2023)   Received from [facility]   Overall Financial Resource Strain (CARDIA)    Difficulty of Paying Living Expenses: Not hard at all  Food Insecurity: No Food Insecurity (11/06/2023)   Received from [facility]   Hunger Vital Sign    Worried About Running Out of Food in the Last Year: Never true    Ran Out of Food in the Last Year: Never true  Transportation Needs: No Transportation Needs (11/06/2023)   Received from [facility] - Transportation    In the past 12 months, has lack of transportation kept you from medical appointments or from getting medications?: No    Lack of Transportation (Non-Medical): No  Physical Activity: Not on file  Stress: Not on file  Social Connections: Not on file  Intimate Partner Violence: Not At Risk (07/18/2023)   Humiliation, Afraid, Rape, and Kick questionnaire    Fear of Current or Ex-Partner: No    Emotionally Abused: No    Physically Abused: No    Sexually Abused: No    Family History:    Family History  Problem Relation Age of Onset   Cancer Mother    Pancreatic cancer Mother    Heart failure Mother    Heart attack Father 51   Heart disease Father      ROS:  Please see the history of present illness.   Physical Exam/Data:   Vitals:   01/21/24 1015 01/21/24 1030 01/21/24 1031 01/21/24 1102  BP: 103/79 (!) 101/46 (!) 82/43 (!) 88/53  Pulse: 93  83 81  Resp: 16 13 10 13   Temp:    (!) 97.5 F (36.4 C)  TempSrc:    Oral  SpO2: 97% 98% 96% 95%  Weight:      Height:        Intake/Output Summary (Last 24 hours) at  01/21/2024 1109 Last data filed at 01/21/2024 0441 Gross per 24 hour  Intake 799.2 ml  Output 475 ml  Net 324.2 ml      01/20/2024    2:33 PM 12/24/2023    9:40 AM 12/01/2023    2:10 PM  Last 3 Weights  Weight (lbs) 145 lb 6.4 oz 141 lb 1.5 oz 142 lb  Weight (kg) 65.953 kg 64 kg 64.411 kg     Body mass index is 22.11 kg/m.  General:  Well nourished, well developed, in no acute distress HEENT: normal Neck: no JVD Cardiac:  normal S1, S2; RRR; no murmur  Lungs:  clear to auscultation bilaterally, no wheezing, rhonchi or rales  Abd: soft, nontender, no hepatomegaly  Ext: no edema Skin: warm and dry  Psych:  Normal affect   EKG:  The EKG was personally reviewed and demonstrates:  atrial fibrillation with rate 133 bpm Telemetry:  Telemetry was personally reviewed and demonstrates:  Sinus rhythm with one episode of atrial fibrillation with rates up to 140s bpm from 4/21 ~2115 to 4/22 ~0540. Now in sinus rhythm.   Relevant CV Studies:  06/26/2023 Echo complete 1. Left ventricular ejection fraction, by estimation, is 60 to 65%. The  left ventricle has normal function. The left ventricle has no regional  wall motion abnormalities. Left ventricular diastolic parameters are  consistent with Grade II diastolic  dysfunction (pseudonormalization).   2. Right ventricular systolic function is normal. The right ventricular  size is normal.   3. The mitral valve is normal in structure. No evidence of mitral valve  regurgitation. No evidence of mitral stenosis.   4. The aortic valve is normal in structure. There is severe calcifcation  of the aortic valve. There is severe thickening of the aortic valve.  Aortic valve regurgitation is mild. Mild to moderate aortic valve  stenosis. Aortic regurgitation PHT measures  337 msec. Aortic valve mean gradient measures 18.2 mmHg. Aortic valve Vmax  measures 2.76 m/s.   5. The inferior vena cava is normal in size with greater than 50%  respiratory  variability, suggesting right atrial pressure of 3 mmHg.   06/24/2023 Lexiscan  MPI   Normal pharmacologic myocardial perfusion stress test without evidence of significant ischemia or scar.   Left ventricular systolic function is normal (LVEF 60-65%).   Coronary artery calcification/stent, aortic atherosclerosis, and aortic valve calcifications noted on the attenuation correction CT.   This is a low risk study.  Laboratory Data:  High Sensitivity Troponin:   Recent Labs  Lab 12/24/23 1130 01/20/24 2226 01/21/24 0123 01/21/24 0510 01/21/24 0734  TROPONINIHS 10 12 166* 653* 866*     Chemistry Recent Labs  Lab 01/20/24 1425 01/20/24 2226 01/21/24 0730  NA  --  137 133*  K  --  3.2* 4.6  CL  --  107 105  CO2  --  19* 22  GLUCOSE  --  109* 117*  BUN 23 21 20   CREATININE 0.71 0.52 0.74  CALCIUM   --  8.7* 8.2*  MG  --  1.5* 2.3  GFRNONAA >60 >60 >60  ANIONGAP  --  11 6    Recent Labs  Lab 01/20/24 2226  PROT 6.5  ALBUMIN 3.5  AST 34  ALT 23  ALKPHOS 44  BILITOT 1.1   Lipids No results for input(s): "CHOL", "TRIG", "HDL", "LABVLDL", "LDLCALC", "CHOLHDL" in the last 168 hours.  Hematology Recent Labs  Lab 01/20/24 2055 01/21/24 0123 01/21/24 0730  WBC 6.2 9.8 8.3  RBC 3.52* 3.29* 2.86*  HGB 11.7* 10.9* 9.4*  HCT 34.5* 32.7* 28.7*  MCV 98.0 99.4 100.3*  MCH 33.2 33.1 32.9  MCHC 33.9 33.3 32.8  RDW 14.4 14.5 14.5  PLT 166 155 156   Thyroid   Recent Labs  Lab 01/21/24 0730  TSH 2.758  FREET4 0.98    BNPNo results for input(s): "BNP", "PROBNP" in the  last 168 hours.  DDimer No results for input(s): "DDIMER" in the last 168 hours.   Radiology/Studies:  PERIPHERAL VASCULAR CATHETERIZATION Result Date: 01/21/2024 See surgical note for result.  PERIPHERAL VASCULAR CATHETERIZATION Result Date: 01/20/2024 See surgical note for result.  Assessment and Plan:   New onset atrial fibrillation with RVR - Per telemetry review and initial EKG, patient was in  sinus rhythm on arrival. Converted to atrial fibrillation on 4/22 ~2115 with rates up to 140s bpm with subsequent conversion to sinus rhythm 4/23 ~0540 - Patient was symptomatic during episode of afib - Given amiodarone  and started on IV heparin  - Echo ordered - Recommend K > 4 and mag > 2 - Remains in sinus rhythm at this time - CHA2DS2VASc at least 5 (age, sex category, vascular, HTN) - Continue IV heparin  when stable from vascular standpoint with plan to transition to Eliquis  or Xarelto prior to discharge - Consider oral amiodarone  to prevent recurrence of afib  Elevated troponin - Troponin [phone] - Suspect demand ischemia in the setting of afib with RVR, rates up to 140s - Repeat 12 lead EKG now that sinus rhythm is restored - Echo ordered, further recommendations pending results - Continue IV heparin  as above  PAD Left ischemic leg - S/p lower extremity angiography and surgical intervention - Vascular managing   For questions or updates, please contact [facility] Please consult [email or web address] for contact info under    Signed, Brodie Cannon, PA-C  01/21/2024 11:09 AM

## 2024-01-21 NOTE — Progress Notes (Signed)
 PHARMACY - ANTICOAGULATION CONSULT NOTE  Pharmacy Consult for heparin  infusion Indication: atrial fibrillation  Allergies  Allergen Reactions   Lactose Other (See Comments) and Nausea And Vomiting    Gi upset and diarrhea  Other reaction(s): Other (See Comments)  Gi upset and diarrhea  Other reaction(s): Other (See Comments) Gi upset and diarrhea Gi upset and diarrhea Gi upset and diarrhea    Gi upset and diarrhea    Gi upset and diarrhea Gi upset and diarrhea Gi upset and diarrhea  Gi upset and diarrhea    Gi upset and diarrhea  Other reaction(s): Other (See Comments) Gi upset and diarrhea Gi upset and diarrhea Gi upset and diarrhea  Gi upset and diarrhea  Gi upset and diarrhea Gi upset and diarrhea Gi upset and diarrhea    Patient Measurements: Height: 5\' 8"  (172.7 cm) Weight: 66 kg (145 lb 6.4 oz) IBW/kg (Calculated) : 63.9 HEPARIN  DW (KG): 66  Vital Signs: Temp: 98.2 F (36.8 C) (04/23 2000) Temp Source: Oral (04/23 2000) BP: 105/51 (04/23 2000) Pulse Rate: 88 (04/23 2000)  Labs: Recent Labs    01/20/24 1425 01/20/24 2055 01/20/24 2226 01/21/24 0123 01/21/24 0510 01/21/24 0730 01/21/24 0734 01/21/24 1145 01/21/24 1328 01/21/24 2127  HGB  --    < >  --  10.9*  --  9.4*  --   --  8.8*  --   HCT  --    < >  --  32.7*  --  28.7*  --   --  27.1*  --   PLT  --    < >  --  155  --  156  --   --  146*  --   HEPARINUNFRC  --    < >  --  0.13*  --  <0.10*  --   --   --  0.81*  CREATININE 0.71  --  0.52  --   --  0.74  --   --   --   --   TROPONINIHS  --    < > 12 166*   < >  --  866* 1,107* 1,018*  --    < > = values in this interval not displayed.    Estimated Creatinine Clearance: 55.6 mL/min (by C-G formula based on SCr of 0.74 mg/dL).   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)    a.) multiple vascular interventions (PTA, stenting, arthrectomy, and thrombectomy) of LEFT SFA, LEFT popliteal, LEFT posterior tibial, and LEFT anterior tibial arteries due to critical limb ischemia   CAD (coronary artery disease) 11/09/2020   a.) cCTA 11/09/2020: Ca2+ 539 (83rd %'ile for age/sex/race match control); <25% dRCA & pLCx, >70% pLAD; b.) LHC/PCI 11/15/2020: 90% pLAD (2.75 x 18 mm Resolute Onyx DES); c.) MV 06/26/2023: no sig ischemia   Cervical spondylosis    Colitis    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)    a.) s/p multiple vascular interventions   Diastolic dysfunction 06/26/2023   a.) TTE 06/26/2023: EF 60-65%, no RWMAs, G2DD, sev AoV thickening with mild AR, mild-mod AS (MPG 18.2 mmHg; AVA 0.73 cm2)   [place]   Dyslipidemia    History  of cervical cancer    a.) s/p TAH   HPV (human papilloma virus) infection    Hyperlipidemia    Hypothyroidism    IGT (impaired glucose tolerance)    Left upper lobe pulmonary nodule    Long term current use of aspirin     Long term current use of ticagrelor  therapy    Macrocytic anemia    Osteoporosis    a.) Tx'd with zolodronic acid (Reclast) infusions   PAD (peripheral artery disease) (HCC)    Polymyalgia rheumatica (HCC)    Skin cancer of nose    Transaminitis    Vaginal dysplasia     Medications:  Scheduled:   amiodarone   200 mg Oral BID   Chlorhexidine  Gluconate Cloth  6 each Topical Daily   levothyroxine   75 mcg Oral QAC breakfast   pantoprazole  (PROTONIX ) IV  40 mg Intravenous QHS   sodium chloride  flush  3 mL Intravenous Q12H   sodium chloride  flush  3 mL Intravenous Q12H    Assessment: 83 yo F presenting to [facility] for scheduled vascular surgery due to 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 s/p angiogram & thrombolysis with tPA and heparin  overnight. She  developed new onset A-fib RVR overnight and is being started on IV heparin . We have an order to start IV heparin  with 1/2 initial bolus  Goal of Therapy:  Heparin  level 0.3-0.7 units/ml Monitor platelets by anticoagulation protocol: Yes   Plan:  4/23:  HL @ 2127 = 0.81, elevated - Will decrease heparin  drip rate to 900 units/hr and recheck HL 8 hrs after rate change  Danette Weinfeld D 01/21/2024,10:09 PM

## 2024-01-22 ENCOUNTER — Ambulatory Visit: Admitting: Nurse Practitioner

## 2024-01-22 DIAGNOSIS — I959 Hypotension, unspecified: Secondary | ICD-10-CM | POA: Diagnosis not present

## 2024-01-22 DIAGNOSIS — I4891 Unspecified atrial fibrillation: Secondary | ICD-10-CM | POA: Diagnosis not present

## 2024-01-22 DIAGNOSIS — I70229 Atherosclerosis of native arteries of extremities with rest pain, unspecified extremity: Secondary | ICD-10-CM | POA: Diagnosis not present

## 2024-01-22 DIAGNOSIS — I25118 Atherosclerotic heart disease of native coronary artery with other forms of angina pectoris: Secondary | ICD-10-CM

## 2024-01-22 DIAGNOSIS — D500 Iron deficiency anemia secondary to blood loss (chronic): Secondary | ICD-10-CM

## 2024-01-22 DIAGNOSIS — I214 Non-ST elevation (NSTEMI) myocardial infarction: Secondary | ICD-10-CM | POA: Diagnosis not present

## 2024-01-22 DIAGNOSIS — E782 Mixed hyperlipidemia: Secondary | ICD-10-CM

## 2024-01-22 LAB — RENAL FUNCTION PANEL
Albumin: 2.8 g/dL — ABNORMAL LOW (ref 3.5–5.0)
Anion gap: 6 (ref 5–15)
BUN: 28 mg/dL — ABNORMAL HIGH (ref 8–23)
CO2: 22 mmol/L (ref 22–32)
Calcium: 7.9 mg/dL — ABNORMAL LOW (ref 8.9–10.3)
Chloride: 103 mmol/L (ref 98–111)
Creatinine, Ser: 0.98 mg/dL (ref 0.44–1.00)
GFR, Estimated: 58 mL/min — ABNORMAL LOW (ref >60)
Glucose, Bld: 134 mg/dL — ABNORMAL HIGH (ref 70–99)
Phosphorus: 3.3 mg/dL (ref 2.5–4.6)
Potassium: 4.7 mmol/L (ref 3.5–5.1)
Sodium: 131 mmol/L — ABNORMAL LOW (ref 135–145)

## 2024-01-22 LAB — T3, FREE: T3, Free: 1.8 pg/mL — ABNORMAL LOW (ref 2.0–4.4)

## 2024-01-22 LAB — HEMOGLOBIN AND HEMATOCRIT, BLOOD
HCT: 18.9 % — ABNORMAL LOW (ref 36.0–46.0)
HCT: 21 % — ABNORMAL LOW (ref 36.0–46.0)
Hemoglobin: 6.5 g/dL — ABNORMAL LOW (ref 12.0–15.0)
Hemoglobin: 7.3 g/dL — ABNORMAL LOW (ref 12.0–15.0)

## 2024-01-22 LAB — CBC
HCT: 18.1 % — ABNORMAL LOW (ref 36.0–46.0)
Hemoglobin: 5.9 g/dL — ABNORMAL LOW (ref 12.0–15.0)
MCH: 33.3 pg (ref 26.0–34.0)
MCHC: 32.6 g/dL (ref 30.0–36.0)
MCV: 102.3 fL — ABNORMAL HIGH (ref 80.0–100.0)
Platelets: 121 10*3/uL — ABNORMAL LOW (ref 150–400)
RBC: 1.77 MIL/uL — ABNORMAL LOW (ref 3.87–5.11)
RDW: 14.6 % (ref 11.5–15.5)
WBC: 8.2 10*3/uL (ref 4.0–10.5)
nRBC: 0 % (ref 0.0–0.2)

## 2024-01-22 LAB — HEPARIN LEVEL (UNFRACTIONATED): Heparin Unfractionated: 0.72 [IU]/mL — ABNORMAL HIGH (ref 0.30–0.70)

## 2024-01-22 LAB — PREPARE RBC (CROSSMATCH)

## 2024-01-22 LAB — THYROID PANEL WITH TSH
Free Thyroxine Index: 1.4 (ref 1.2–4.9)
T3 Uptake Ratio: 31 % (ref 24–39)
T4, Total: 4.5 ug/dL (ref 4.5–12.0)
TSH: 2.86 u[IU]/mL (ref 0.450–4.500)

## 2024-01-22 LAB — MAGNESIUM: Magnesium: 2.2 mg/dL (ref 1.7–2.4)

## 2024-01-22 MED ORDER — ASPIRIN 81 MG PO TBEC
81.0000 mg | DELAYED_RELEASE_TABLET | Freq: Every day | ORAL | Status: DC
Start: 2024-01-22 — End: 2024-01-23
  Administered 2024-01-22 – 2024-01-23 (×2): 81 mg via ORAL
  Filled 2024-01-22 (×2): qty 1

## 2024-01-22 MED ORDER — APIXABAN 5 MG PO TABS
5.0000 mg | ORAL_TABLET | Freq: Two times a day (BID) | ORAL | Status: DC
  Administered 2024-01-22: 5 mg via ORAL
  Filled 2024-01-22: qty 1

## 2024-01-22 MED ORDER — APIXABAN 2.5 MG PO TABS
2.5000 mg | ORAL_TABLET | Freq: Two times a day (BID) | ORAL | Status: DC
  Administered 2024-01-22 – 2024-02-02 (×22): 2.5 mg via ORAL
  Filled 2024-01-22 (×22): qty 1

## 2024-01-22 MED ORDER — SODIUM CHLORIDE 0.9% IV SOLUTION
Freq: Once | INTRAVENOUS | Status: DC
Start: 2024-01-22 — End: 2024-01-23

## 2024-01-22 MED ORDER — TICAGRELOR 90 MG PO TABS
90.0000 mg | ORAL_TABLET | Freq: Two times a day (BID) | ORAL | Status: DC
  Administered 2024-01-22 – 2024-02-02 (×23): 90 mg via ORAL
  Filled 2024-01-22 (×23): qty 1

## 2024-01-22 MED ORDER — SODIUM CHLORIDE 0.9% IV SOLUTION: Freq: Once | INTRAVENOUS | Status: AC

## 2024-01-22 MED ORDER — LIDOCAINE-EPINEPHRINE 1 %-1:100000 IJ SOLN
10.0000 mL | Freq: Once | INTRAMUSCULAR | Status: DC
  Filled 2024-01-22: qty 1

## 2024-01-22 NOTE — Progress Notes (Signed)
 PHARMACY CONSULT NOTE - FOLLOW UP  Pharmacy Consult for Electrolyte Monitoring and Replacement   Recent Labs: Potassium (mmol/L)  Date Value  01/22/2024 4.7   Magnesium  (mg/dL)  Date Value  91/47/8295 2.2   Calcium  (mg/dL)  Date Value  62/13/0865 7.9 (L)   Albumin (g/dL)  Date Value  78/46/9629 2.8 (L)  06/17/2023 4.5   Phosphorus (mg/dL)  Date Value  52/84/1324 3.3   Sodium (mmol/L)  Date Value  01/22/2024 131 (L)     Assessment: 83 yo F presenting to [facility] for scheduled vascular surgery due to 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 She developed new onset A-fib RVR overnight. Pharmacy is asked to follow and replace electrolytes while in CCU   Goal of Therapy:  Potassium 4.0 - 5.1 mmol/L Magnesium  2.0 - 2.4 mg/dL All Other Electrolytes WNL  Plan:  ---no electrolyte replacement warranted for today ---recheck electrolytes in am  Adalberto Acton ,PharmD Clinical Pharmacist 01/22/2024 7:13 AM

## 2024-01-22 NOTE — Progress Notes (Signed)
 S: This evening she notes her left leg has become very swollen and very difficult to move.  She is concerned that she has been in bed for this many days.  O: Vital signs remained stable no further A-fib      The right groin is soft with essentially no ecchymoses it is tender to palpation.  The left leg is now 3+ edema fairly firm it is warm with a hyperemic response there is brisk capillary refill and a 4+ palpable posterior tibial pulse.  Labs are pending.  A: Ischemic left lower extremity status post revascularization  P: We will recheck labs tomorrow, follow the hemoglobin.  We will certainly mobilize her and have therapy evaluate.  Continue supportive care.  She has had successful revascularization.

## 2024-01-22 NOTE — Progress Notes (Signed)
 Acute blood loss anemia Patient has been oozing from Right femoral site. This morning's Hgb dropped to 5.9. Discussed with patient at length as she has a blood product refusal flag. The patient stated that in general she does not like the idea of receiving someone else's blood but is OK with overriding this previous refusal for this one instance. She would like to re-discuss if needed in the future. - 1 unit pRBC's ordered - f/u H&H 1 hour post transfusion   Recardo Canal Rust-Chester, AGACNP-BC Acute Care Nurse Practitioner [facility]   [phone] / [phone] Please see Amion for details.

## 2024-01-22 NOTE — IPAL (Signed)
  Interdisciplinary Goals of Care Family Meeting   Date carried out: 01/22/2024  Location of the meeting: Bedside  Member's involved: Nurse Practitioner  Durable Power of Attorney or acting medical decision maker: Biana Haggar    Discussion: We discussed goals of care for Corrine Tillis .  Called to bedside by nursing as pt is requesting that her DNR/DNI be reinstated.  She is awake and alert, oriented x4.  She is very adamant that she would never want CPR or to be intubated should she suffer cardiac or respiratory arrest.  Code status:   Code Status: Limited: Do not attempt resuscitation (DNR) -DNR-LIMITED -Do Not Intubate/DNI    Disposition: Continue current acute care  Time spent for the meeting: 10 minutes   Cherylann Corpus, AGACNP-BC [facility] Prefer epic messenger for cross cover needs If after hours, please call E-link  Delanna Fears, NP  01/22/2024, 12:51 PM

## 2024-01-22 NOTE — Progress Notes (Signed)
 Progress Note    01/22/2024 7:32 AM 1 Day Post-Op  Subjective: Melanie Browning is an 83 year old female now status postop day 1 from left lower extremity angiogram and lysis catheter with transluminal angioplasty and stent placement to the left SFA, transluminal angioplasty and stent placement to the left tibioperoneal trunk and proximal posterior tibial artery and coil embolization of the venous branch of the failed saphenous vein bypass using Ruby coils.  On exam this morning patient resting comfortably in bed.  She noted to have a pressure dressing to her right groin that has been continuously oozing.  Dressings been changed multiple times overnight.  Patient endorses she has pain to her right groin this morning where the catheters were inserted and now the pressure dressing is in place.  She also endorses that she is having difficulty moving or lifting her left leg as it feels heavy.  There is noticeable +1 to +2 edema with reperfusion syndrome to her leg that received the lysis and now the stenting.  Patient noted to have converted to atrial fibrillation with RVR during procedure yesterday.  Cardiology was consulted.  They are okay with current anticoagulation.  Patient was initially placed on amiodarone  infusion.  This was changed to amiodarone  200 mg twice daily.  Patient is currently in normal sinus rhythm.  Review of Systems  Constitutional:  Constitutional negative. Respiratory: Respiratory negative.  Cardiovascular: Cardiovascular negative.  GI: Gastrointestinal negative.  GU: Genitourinary negative. Musculoskeletal: Musculoskeletal negative.  Skin: Skin negative.  Neurological: Neurological negative. Psychiatric: Psychiatric negative.  All other systems reviewed and are negative   Vitals:   01/22/24 0000 01/22/24 0400  BP: (!) 115/47 (!) 101/49  Pulse: 84 78  Resp: 17 (!) 21  Temp:  (!) 97.3 F (36.3 C)  SpO2: 98% 100%   Physical Exam: Cardiac:  RRR, normal S1 and S2.   No rubs clicks gallops or murmurs noted this morning. Lungs: [name] on auscultation throughout but diminished in the bases.  No rales rhonchi or wheezing noted.  Nonlabored breathing. Incisions: Right groin incision with pressure dressing in place due to oozing overnight. Extremities: Bilateral lower extremities warm to touch both with positive Doppler DP and PT pulses that are strong. Abdomen: Positive bowel sounds throughout, soft, nontender and nondistended. Neurologic: Alert and oriented x 3, answers all questions and follows commands appropriately.  CBC    Component Value Date/Time   WBC 8.2 01/22/2024 0506   RBC 1.77 (L) 01/22/2024 0506   HGB 5.9 (L) 01/22/2024 0506   HGB 13.2 06/17/2023 0931   HCT 18.1 (L) 01/22/2024 0506   HCT 39.8 06/17/2023 0931   PLT 121 (L) 01/22/2024 0506   PLT 181 06/17/2023 0931   MCV 102.3 (H) 01/22/2024 0506   MCV 99 (H) 06/17/2023 0931   MCH 33.3 01/22/2024 0506   MCHC 32.6 01/22/2024 0506   RDW 14.6 01/22/2024 0506   RDW 13.1 06/17/2023 0931   LYMPHSABS 0.8 01/03/2023 1142   MONOABS 0.5 01/03/2023 1142   EOSABS 0.1 01/03/2023 1142   BASOSABS 0.0 01/03/2023 1142    BMET    Component Value Date/Time   NA 131 (L) 01/22/2024 0506   K 4.7 01/22/2024 0506   CL 103 01/22/2024 0506   CO2 22 01/22/2024 0506   GLUCOSE 134 (H) 01/22/2024 0506   BUN 28 (H) 01/22/2024 0506   CREATININE 0.98 01/22/2024 0506   CALCIUM  7.9 (L) 01/22/2024 0506   GFRNONAA 58 (L) 01/22/2024 0506    INR No results found for: "  INR"   Intake/Output Summary (Last 24 hours) at 01/22/2024 0732 Last data filed at 01/21/2024 1354 Gross per 24 hour  Intake 1576.43 ml  Output 350 ml  Net 1226.43 ml     Assessment/Plan:  83 y.o. female is s/p left lower extremity angiogram with lysis catheter and multiple angioplasty with stent placement.  Also noted to have placed Ruby coils for failed saphenous vein bypass graft.  1 Day Post-Op   PLAN Discontinue heparin  infusion this  morning. Start ASA 81 mg p.o. daily, Brilinta  90 mg p.o. daily, and Eliquis  2.5 mg twice daily. Continue with Foley catheter until patient able to ambulate. Right groin oozing controlled with subcutaneous injection of lidocaine  with 1% epinephrine .  3 cc used to control bleeding.  Monocryl 2.0 suture used at the skin level to control oozing. Advance diet as tolerated. Pain medication as needed Discontinue IV fluids as patient is taking p.o. Patient to receive 2 units packed red blood cells today for hemoglobin of 5.8. CBC and BMP in the morning.  DVT prophylaxis: Heparin  infusion   Annamaria Barrette [facility] 01/22/2024 7:32 AM

## 2024-01-22 NOTE — Progress Notes (Signed)
 Rounding Note    Patient Name: Melanie Browning Date of Encounter: 01/22/2024  [facility] Cardiologist: Mertie Abt   Subjective   Patient seen on AM rounds. Denies any chest pain or shortness of breath. Continues to have numbness and swelling to the BLE worse on the left than the right. No A-fib noted on telemetry overnight. Hemoglobin 5.9 this morning with 2 units of PRBC's pending transfusion.  Telemetry reviewed, maintaining normal sinus rhythm  Inpatient Medications    Scheduled Meds:  sodium chloride    Intravenous Once   sodium chloride    Intravenous Once   amiodarone   200 mg Oral BID   Chlorhexidine  Gluconate Cloth  6 each Topical Daily   levothyroxine   75 mcg Oral QAC breakfast   pantoprazole  (PROTONIX ) IV  40 mg Intravenous QHS   Continuous Infusions:  sodium chloride      heparin  850 Units/hr (01/22/24 0725)   lactated ringers  75 mL/hr at 01/22/24 0808   PRN Meds: sodium chloride , acetaminophen , ALPRAZolam , diphenoxylate -atropine , hydrALAZINE , hydrALAZINE , HYDROmorphone  (DILAUDID ) injection, labetalol , midazolam , nitroGLYCERIN , ondansetron  (ZOFRAN ) IV, oxyCODONE    Vital Signs    Vitals:   01/21/24 2000 01/22/24 0000 01/22/24 0400 01/22/24 0700  BP: (!) 105/51 (!) 115/47 (!) 101/49 100/61  Pulse: 88 84 78 77  Resp: 16 17 (!) 21 12  Temp: 98.2 F (36.8 C)  (!) 97.3 F (36.3 C)   TempSrc: Oral  Oral   SpO2: 98% 98% 100% 98%  Weight:      Height:        Intake/Output Summary (Last 24 hours) at 01/22/2024 0837 Last data filed at 01/21/2024 1354 Gross per 24 hour  Intake 1576.43 ml  Output 350 ml  Net 1226.43 ml      01/20/2024    2:33 PM 12/24/2023    9:40 AM 12/01/2023    2:10 PM  Last 3 Weights  Weight (lbs) 145 lb 6.4 oz 141 lb 1.5 oz 142 lb  Weight (kg) 65.953 kg 64 kg 64.411 kg      Telemetry    Sinus with large amount of artifact with rates 70-90 pbm - Personally Reviewed  ECG    No new tracings - Personally Reviewed  Physical  Exam   GEN: No acute distress.   Neck: No JVD Cardiac: RRR, no murmurs, rubs, or gallops.  Respiratory: Clear to auscultation bilaterally. GI: Soft, nontender, non-distended  MS: No edema; No deformity. Neuro:  Nonfocal  Psych: Normal affect   Labs    High Sensitivity Troponin:   Recent Labs  Lab 01/21/24 0123 01/21/24 0510 01/21/24 0734 01/21/24 1145 01/21/24 1328  TROPONINIHS 166* 653* 866* 1,107* 1,018*     Chemistry Recent Labs  Lab 01/20/24 2226 01/21/24 0730 01/22/24 0506  NA 137 133* 131*  K 3.2* 4.6 4.7  CL 107 105 103  CO2 19* 22 22  GLUCOSE 109* 117* 134*  BUN 21 20 28*  CREATININE 0.52 0.74 0.98  CALCIUM  8.7* 8.2* 7.9*  MG 1.5* 2.3 2.2  PROT 6.5  --   --   ALBUMIN 3.5  --  2.8*  AST 34  --   --   ALT 23  --   --   ALKPHOS 44  --   --   BILITOT 1.1  --   --   GFRNONAA >60 >60 58*  ANIONGAP 11 6 6     Lipids No results for input(s): "CHOL", "TRIG", "HDL", "LABVLDL", "LDLCALC", "CHOLHDL" in the last 168 hours.  Hematology Recent Labs  Lab  01/21/24 0730 01/21/24 1328 01/22/24 0506  WBC 8.3 9.5 8.2  RBC 2.86* 2.63* 1.77*  HGB 9.4* 8.8* 5.9*  HCT 28.7* 27.1* 18.1*  MCV 100.3* 103.0* 102.3*  MCH 32.9 33.5 33.3  MCHC 32.8 32.5 32.6  RDW 14.5 14.5 14.6  PLT 156 146* 121*   Thyroid   Recent Labs  Lab 01/21/24 0730  TSH 2.860  2.758  FREET4 0.98    BNPNo results for input(s): "BNP", "PROBNP" in the last 168 hours.  DDimer No results for input(s): "DDIMER" in the last 168 hours.   Radiology      Cardiac Studies   PV procedure April 22   Percutaneous transluminal angioplasty and stent placement left SFA to 6 mm             3.  Percutaneous transluminal angioplasty and stent placement left tibioperoneal trunk and proximal posterior tibial artery with Esprit stents dilated to 3.5 mm distally and 4 mm proximally             4.  Introduction catheter into the venous branch of the failed saphenous vein bypass             5.  Coil  embolization of the venous branch of the failed saphenous vein bypass using Ruby coils    Patient Profile     83 y.o. female with a past medical history of PVD with critical limb ischemia requiring intervention, claudication, CAD s/p PCI to LAD (10/2020), HTN, HLD, and hypothyroidism, who is being seen and evaluated for new onset atrial fibrillation.  Assessment & Plan    Non-STEMI In the setting of atrial fibrillation with RVR, known coronary artery disease, prior stenting to the LAD 2/22 Postintervention to left lower extremity on TPA -Converting to normal sinus rhythm yesterday 5 AM on amiodarone  bolus/infusion, -Plan to transition heparin  infusion to aspirin , Brilinta , reduced dose Eliquis  -No immediate plans for ischemic workup Consider outpatient ischemic workup for symptoms concerning for angina   Atrial fibrillation with RVR exacerbated by severe pain left leg, postprocedure April 22, hypokalemia 3.2, hypertensive urgency -Converting to normal sinus rhythm on amiodarone  infusion -Will continue amiodarone  200 twice daily - On reduced dose Eliquis  for now, Brilinta  aspirin   Hypotension In the setting of groin bleed, postprocedure Blood pressure stable but remains low  4.  PAD Severe lower extremity arterial disease DVT/intervention April 22 and April 24 detailed as above - Postprocedure groin hematoma - Bilateral leg pain - On aspirin , Brilinta , plan to initiate reduced dose Eliquis   Hyperlipidemia Previously declined therapy, stopped Lipitor or Zetia  on her own Total cholesterol 323 LDL 163 Has been hesitant to start PCSK9 inhibitor   For questions or updates, please contact [facility] Please consult [email or web address] for contact info under        Signed, SHERI HAMMOCK, NP  01/22/2024, 8:37 AM

## 2024-01-22 NOTE — Progress Notes (Signed)
 PHARMACY - ANTICOAGULATION CONSULT NOTE  Pharmacy Consult for heparin  infusion Indication: atrial fibrillation  Allergies  Allergen Reactions   Lactose Other (See Comments) and Nausea And Vomiting    Gi upset and diarrhea  Other reaction(s): Other (See Comments)  Gi upset and diarrhea  Other reaction(s): Other (See Comments) Gi upset and diarrhea Gi upset and diarrhea Gi upset and diarrhea    Gi upset and diarrhea    Gi upset and diarrhea Gi upset and diarrhea Gi upset and diarrhea  Gi upset and diarrhea    Gi upset and diarrhea  Other reaction(s): Other (See Comments) Gi upset and diarrhea Gi upset and diarrhea Gi upset and diarrhea  Gi upset and diarrhea  Gi upset and diarrhea Gi upset and diarrhea Gi upset and diarrhea    Patient Measurements: Height: 5\' 8"  (172.7 cm) Weight: 66 kg (145 lb 6.4 oz) IBW/kg (Calculated) : 63.9 HEPARIN  DW (KG): 66  Vital Signs: Temp: 97.3 F (36.3 C) (04/24 0400) Temp Source: Oral (04/24 0400) BP: 101/49 (04/24 0400) Pulse Rate: 78 (04/24 0400)  Labs: Recent Labs    01/20/24 2226 01/21/24 0123 01/21/24 0730 01/21/24 0734 01/21/24 1145 01/21/24 1328 01/21/24 2127 01/22/24 0506  HGB  --    < > 9.4*  --   --  8.8*  --  5.9*  HCT  --    < > 28.7*  --   --  27.1*  --  18.1*  PLT  --    < > 156  --   --  146*  --  121*  HEPARINUNFRC  --    < > <0.10*  --   --   --  0.81* 0.72*  CREATININE 0.52  --  0.74  --   --   --   --  0.98  TROPONINIHS 12   < >  --  866* 1,107* 1,018*  --   --    < > = values in this interval not displayed.    Estimated Creatinine Clearance: 45.4 mL/min (by C-G formula based on SCr of 0.98 mg/dL).   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)    a.) multiple  vascular interventions (PTA, stenting, arthrectomy, and thrombectomy) of LEFT SFA, LEFT popliteal, LEFT posterior tibial, and LEFT anterior tibial arteries due to critical limb ischemia   CAD (coronary artery disease) 11/09/2020   a.) cCTA 11/09/2020: Ca2+ 539 (83rd %'ile for age/sex/race match control); <25% dRCA & pLCx, >70% pLAD; b.) LHC/PCI 11/15/2020: 90% pLAD (2.75 x 18 mm Resolute Onyx DES); c.) MV 06/26/2023: no sig ischemia   Cervical spondylosis    Colitis    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)    a.) s/p multiple vascular interventions   Diastolic dysfunction 06/26/2023   a.) TTE 06/26/2023: EF 60-65%, no RWMAs, G2DD, sev AoV thickening with mild AR, mild-mod AS (MPG 18.2 mmHg; AVA 0.73 cm2)   [place]   Dyslipidemia    History of cervical cancer    a.) s/p TAH   HPV (human papilloma virus) infection    Hyperlipidemia    Hypothyroidism    IGT (impaired glucose tolerance)    Left upper lobe pulmonary nodule  Long term current use of aspirin     Long term current use of ticagrelor  therapy    Macrocytic anemia    Osteoporosis    a.) Tx'd with zolodronic acid (Reclast) infusions   PAD (peripheral artery disease) (HCC)    Polymyalgia rheumatica (HCC)    Skin cancer of nose    Transaminitis    Vaginal dysplasia     Medications:  Scheduled:   sodium chloride    Intravenous Once   amiodarone   200 mg Oral BID   Chlorhexidine  Gluconate Cloth  6 each Topical Daily   levothyroxine   75 mcg Oral QAC breakfast   pantoprazole  (PROTONIX ) IV  40 mg Intravenous QHS   sodium chloride  flush  3 mL Intravenous Q12H   sodium chloride  flush  3 mL Intravenous Q12H    Assessment: 83 yo F presenting to [facility] for scheduled vascular surgery due to 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 s/p angiogram & thrombolysis with tPA and heparin  overnight. She developed new onset A-fib RVR overnight and is being  started on IV heparin . We have an order to start IV heparin  with 1/2 initial bolus  Goal of Therapy:  Heparin  level 0.3-0.7 units/ml Monitor platelets by anticoagulation protocol: Yes   Plan:  4/23:  HL @ 2127 = 0.81, elevated - Will decrease heparin  drip rate to 900 units/hr and recheck HL 8 hrs after rate change  4/24:  HL @ 0506 = 0.72, elevated  - Will decrease heparin  drip rate to 850 units/hr and recheck HL 8 hrs after rate change   Olive Motyka D 01/22/2024,6:49 AM

## 2024-01-22 NOTE — Plan of Care (Signed)

## 2024-01-22 NOTE — Progress Notes (Signed)
 NAME:  Cecila Satcher, MRN:  [id number], DOB:  1940/11/19, LOS: 2 ADMISSION DATE:  01/20/2024, CONSULTATION DATE:  01/20/24 REFERRING MD:  Dr. 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 She returned for outpatient follow up after left common femoral artery to posterior tibial artery bypass in October 2024 with complaints of worsening claudication symptoms as well as numbness and pain at night when she lies down. At outpatient follow up on 4/15 there were no open wounds noted but studies showed that the graft had occluded just past the region with some diminished monophasic flow in the distal posterior tibial artery.  She was instructed to present on 4/22 for scheduled angiography of the LLE with hope for limb salvage.  Hospital course: She underwent LLE angiography with angioplasty and stent placement left SFA and popliteal artery, angioplasty of the left posterior tibial artery, thrombectomy of left SFA, popliteal/ tibioperoneal trunk/posterior tibial. Thrombolysis was initiated with TPA and heparin  to continue overnight. Plans for vascular intervention on 01/21/24. Overnight patient having severe pain and developed new onset A-fib RVR. Patient asymptomatic bedside, only complaints are some anxiety over current clinical condition and outcomes and severe pain.   Significant labs: (Labs/ Imaging personally reviewed) I, Recardo Canal Rust-Chester, AGACNP-BC, personally viewed and interpreted this ECG. EKG Interpretation: Date: 01/20/24, EKG Time: 22:41, Rate: 132, Rhythm: A-fib RVR, QRS Axis:  RAD, Intervals: normal, ST/T Wave abnormalities: ST depression inferior and leads v2-v6, Narrative Interpretation: A-fib RVR with signs of ischemia Chemistry: Na+:137, K+: 3.2, BUN/Cr.: 21/ 0.52, Serum CO2/ AG: 19/ 11, Mg: 1.5 Hematology: WBC: 6.2,  Hgb: 11.7,   PCCM consulted for assistance in management and monitoring due to new onset A-fib RVR in the setting of severe reperfusion pain.  Pertinent  Medical History  Aortic Stenosis CAD HFpEF HLD HPV HLD Hypothyroidism Macrocytic anemia PAD Polymyalgia rheumatica  Significant Hospital Events: Including procedures, antibiotic start and stop dates in addition to other pertinent events   01/20/24: Admit to ICU by vascular surgery with ischemic LLE s/p angiogram, stent placement and lytic therapy overnight. New onset A-fib RVR, PCCM consulted 01/21/24- patient is improved, electrolytes better. Adequate UOP today.  Atrial fibrillation is rate controlled.  01/22/24- -patient had acute blood loss overnight with drop in h/h s/p transfusion. Vital signs stable this morning.  Patient was transfused second unit today    Objective   Blood pressure (!) 110/47, pulse 79, temperature 97.7 F (36.5 C), resp. rate 16, height 5\' 8"  (1.727 m), weight 66 kg, SpO2 100%.        Intake/Output Summary (Last 24 hours) at 01/22/2024 1025 Last data filed at 01/22/2024 1013 Gross per 24 hour  Intake 1576.43 ml  Output 450 ml  Net 1126.43 ml   Filed Weights   01/20/24 1433  Weight: 66 kg    Examination: General: Adult female, acutely ill, lying in bed, NAD HEENT: MM pink/moist, anicteric, atraumatic, neck supple Neuro: A&O x 4, able to follow commands, PERRL +3, MAE CV: s1s2 irregular, A-fib RVR on monitor, no r/m/g Pulm: Regular, non labored on RA, breath sounds clear-BUL & clear/ diminished-BLL GI: soft, rounded, non tender, bs x 4 GU: foley in place with clear yellow urine Skin: oozing noted at R fem site- unable to clearly see site due to pressure dressing- no rashes/lesions noted Extremities: warm/dry, pulses +  1 R/P(R side), L side absent pedal, doppler tibial +1 radial, no edema noted  Resolved Hospital Problem list     Assessment & Plan:  New Onset Atrial Fibrillation with Rapid  Ventricular Response- now rate controlled  ST depression inferior & leads V2-V6 CHA2DS2-VASc score: 5 points - STAT BMP, Mg, Phos. Recent H&H stable - 1 L LR bolus ordered, will add one time dilaudid  dose with valium  IV to see if pain can be better controlled - Trend troponin, f/u TSH & thyroid  panel - Echocardiogram ordered - f/u EKG in AM - Consider cardiology consultation depending on w/u above - Continuous cardiac monitoring   Severe anemia    - s/p prbc transfusion h/h recheck in process  Hypokalemia Hypomagnesemia - Daily BMP, replace electrolytes PRN - 4 g of Mg & 60 meq K+ ordered  PAD Left Ischemic Leg - vascular managing - continue lytics and heparin  overnight - monitor oozing, CBC & fibrinogen    Best Practice (right click and "Reselect all SmartList Selections" daily)  Diet/type: clear liquids DVT prophylaxis systemic heparin  Pressure ulcer(s): N/A GI prophylaxis: N/A Lines: N/A Foley:  Yes, and it is still needed Code Status:  full code Last date of multidisciplinary goals of care discussion [per primary 01/20/24]  Labs   CBC: Recent Labs  Lab 01/20/24 2055 01/21/24 0123 01/21/24 0730 01/21/24 1328 01/22/24 0506  WBC 6.2 9.8 8.3 9.5 8.2  HGB 11.7* 10.9* 9.4* 8.8* 5.9*  HCT 34.5* 32.7* 28.7* 27.1* 18.1*  MCV 98.0 99.4 100.3* 103.0* 102.3*  PLT 166 155 156 146* 121*    Basic Metabolic Panel: Recent Labs  Lab 01/20/24 1425 01/20/24 2226 01/21/24 0730 01/22/24 0506  NA  --  137 133* 131*  K  --  3.2* 4.6 4.7  CL  --  107 105 103  CO2  --  19* 22 22  GLUCOSE  --  109* 117* 134*  BUN 23 21 20  28*  CREATININE 0.71 0.52 0.74 0.98  CALCIUM   --  8.7* 8.2* 7.9*  MG  --  1.5* 2.3 2.2  PHOS  --  3.0 2.8 3.3   GFR: Estimated Creatinine Clearance: 45.4 mL/min (by C-G formula based on SCr of 0.98 mg/dL). Recent Labs  Lab 01/21/24 0123 01/21/24 0730 01/21/24 1328 01/22/24 0506  WBC 9.8 8.3 9.5 8.2    Liver Function Tests: Recent Labs  Lab  01/20/24 2226 01/22/24 0506  AST 34  --   ALT 23  --   ALKPHOS 44  --   BILITOT 1.1  --   PROT 6.5  --   ALBUMIN 3.5 2.8*   No results for input(s): "LIPASE", "AMYLASE" in the last 168 hours. No results for input(s): "AMMONIA" in the last 168 hours.  ABG No results found for: "PHART", "PCO2ART", "PO2ART", "HCO3", "TCO2", "ACIDBASEDEF", "O2SAT"   Coagulation Profile: No results for input(s): "INR", "PROTIME" in the last 168 hours.  Cardiac Enzymes: No results for input(s): "CKTOTAL", "CKMB", "CKMBINDEX", "TROPONINI" in the last 168 hours.  HbA1C: No results found for: "HGBA1C"  CBG: Recent Labs  Lab 01/20/24 2048  GLUCAP 77    Review of Systems: positives in BOLD  Gen: Denies fever, chills, weight change, fatigue, night sweats, bilateral lower extremity pain/ severe pain Left foot and ankle HEENT: Denies blurred vision, double vision, hearing loss, tinnitus, sinus congestion, rhinorrhea, sore throat, neck stiffness, dysphagia PULM: Denies shortness of breath, cough, sputum production, hemoptysis, wheezing CV: Denies chest pain, edema, orthopnea, paroxysmal nocturnal dyspnea, palpitations GI: Denies  abdominal pain, nausea, vomiting, diarrhea, hematochezia, melena, constipation, change in bowel habits GU: Denies dysuria, hematuria, polyuria, oliguria, urethral discharge Endocrine: Denies hot or cold intolerance, polyuria, polyphagia or appetite change Derm: Denies rash, dry skin, scaling or peeling skin change Heme: Denies easy bruising, bleeding, bleeding gums Neuro: Denies headache, numbness, weakness, slurred speech, loss of memory or consciousness  Past Medical History:  She,  has a past medical history of Aortic atherosclerosis (HCC), Aortic stenosis (06/26/2023), Arthritis, 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), CAD (coronary artery disease) (11/09/2020), Cervical spondylosis, Colitis, Critical limb ischemia of  left lower extremity (HCC), Diastolic dysfunction (06/26/2023), Dry eye, Dyslipidemia, History of cervical cancer, HPV (human papilloma virus) infection, Hyperlipidemia, Hypothyroidism, IGT (impaired glucose tolerance), Left upper lobe pulmonary nodule, Long term current use of aspirin , Long term current use of ticagrelor  therapy, Macrocytic anemia, Osteoporosis, PAD (peripheral artery disease) (HCC), Polymyalgia rheumatica (HCC), Skin cancer of nose, Transaminitis, and Vaginal dysplasia.   Surgical History:   Past Surgical History:  Procedure Laterality Date   COLONOSCOPY WITH PROPOFOL  N/A 10/04/2020   Procedure: COLONOSCOPY WITH PROPOFOL ;  Surgeon: Toledo, Alphonsus Jeans, MD;  Location: [facility] ENDOSCOPY;  Service: Gastroenterology;  Laterality: N/A;   COLPOSCOPY     CORONARY STENT INTERVENTION N/A 11/15/2020   Procedure: CORONARY STENT INTERVENTION;  Surgeon: Antonette Batters, MD;  Location: [facility] INVASIVE CV LAB;  Service: Cardiovascular;  Laterality: N/A;   FEMORAL-POPLITEAL BYPASS GRAFT Left 07/16/2023   Procedure: BYPASS GRAFT FEMORAL-POPLITEAL ARTERY (FEMORAL-POSTERIOR TIBIAL BYPASS W/ SAPHENOUS VEIN- [id number]);  Surgeon: Jackquelyn Mass, MD;  Location: [facility] ORS;  Service: Vascular;  Laterality: Left;   KNEE ARTHROSCOPY Left    LAPAROSCOPIC HYSTERECTOMY     LEFT HEART CATH AND CORONARY ANGIOGRAPHY N/A 11/15/2020   Procedure: LEFT HEART CATH AND CORONARY ANGIOGRAPHY;  Surgeon: Antonette Batters, MD;  Location: [facility] CV [facility];  Service: Cardiovascular;  Laterality: N/A;   LOWER EXTREMITY ANGIOGRAPHY Left 09/05/2020   Procedure: LOWER EXTREMITY ANGIOGRAPHY;  Surgeon: Jackquelyn Mass, MD;  Location: [facility] CV [facility];  Service: Cardiovascular;  Laterality: Left;   LOWER EXTREMITY ANGIOGRAPHY Left 04/10/2021   Procedure: LOWER EXTREMITY ANGIOGRAPHY;  Surgeon: Jackquelyn Mass, MD;  Location: [facility];  Service: Cardiovascular;  Laterality: Left;   LOWER EXTREMITY  ANGIOGRAPHY Left 11/20/2021   Procedure: Lower Extremity Angiography;  Surgeon: Jackquelyn Mass, MD;  Location: [facility];  Service: Cardiovascular;  Laterality: Left;   LOWER EXTREMITY ANGIOGRAPHY Left 06/05/2022   Procedure: Lower Extremity Angiography;  Surgeon: Jackquelyn Mass, MD;  Location: [facility];  Service: Cardiovascular;  Laterality: Left;   LOWER EXTREMITY ANGIOGRAPHY Left 07/23/2022   Procedure: Lower Extremity Angiography;  Surgeon: Jackquelyn Mass, MD;  Location: [facility];  Service: Cardiovascular;  Laterality: Left;   LOWER EXTREMITY ANGIOGRAPHY Left 12/27/2022   Procedure: Lower Extremity Angiography;  Surgeon: Jackquelyn Mass, MD;  Location: [facility];  Service: Cardiovascular;  Laterality: Left;   LOWER EXTREMITY ANGIOGRAPHY Left 06/17/2023   Procedure: Lower Extremity Angiography;  Surgeon: Jackquelyn Mass, MD;  Location: [facility];  Service: Cardiovascular;  Laterality: Left;   LOWER EXTREMITY ANGIOGRAPHY Left 01/20/2024   Procedure: Lower Extremity Angiography;  Surgeon: Jackquelyn Mass, MD;  Location: [facility];  Service: Cardiovascular;  Laterality: Left;   LOWER EXTREMITY ANGIOGRAPHY Left 01/21/2024   Procedure: Lower Extremity Angiography;  Surgeon: Jackquelyn Mass, MD;  Location: [facility];  Service: Cardiovascular;  Laterality: Left;   LOWER EXTREMITY INTERVENTION Left 01/20/2024   Procedure: LOWER EXTREMITY INTERVENTION;  Surgeon: Jackquelyn Mass, MD;  Location: [facility];  Service: Cardiovascular;  Laterality: Left;   TOTAL ABDOMINAL HYSTERECTOMY       Social History:   reports that she has never smoked. She has never used smokeless tobacco. She reports current alcohol  use of about 1.0 standard drink of alcohol  per week. She reports that she does not use drugs.   Family History:  Her family history includes Cancer in her mother; Heart attack (age of  onset: 74) in her father; Heart disease in her father; Heart failure in her mother; Pancreatic cancer in her mother.   Allergies Allergies  Allergen Reactions   Lactose Other (See Comments) and Nausea And Vomiting    Gi upset and diarrhea  Other reaction(s): Other (See Comments)  Gi upset and diarrhea  Other reaction(s): Other (See Comments) Gi upset and diarrhea Gi upset and diarrhea Gi upset and diarrhea    Gi upset and diarrhea    Gi upset and diarrhea Gi upset and diarrhea Gi upset and diarrhea  Gi upset and diarrhea    Gi upset and diarrhea  Other reaction(s): Other (See Comments) Gi upset and diarrhea Gi upset and diarrhea Gi upset and diarrhea  Gi upset and diarrhea  Gi upset and diarrhea Gi upset and diarrhea Gi upset and diarrhea     Home Medications  Prior to Admission medications   Medication Sig Start Date End Date Taking? Authorizing Provider  aspirin  EC 81 MG tablet Take 81 mg by mouth every evening. Swallow whole.   Yes [provider]  B Complex Vitamins (VITAMIN B-COMPLEX) TABS Take 1 tablet by mouth daily.   Yes [provider]  budesonide  (ENTOCORT EC ) 3 MG 24 hr capsule Take 3 capsules (9 mg total) by mouth every morning. 09/26/23  Yes Marnee Sink, MD  diphenoxylate -atropine  (LOMOTIL ) 2.5-0.025 MG tablet TAKE 1 TABLET BY MOUTH 3 TIMES DAILY AS NEEDED FOR DIARRHEA. 09/12/21  Yes Vanga, Rohini Reddy, MD  levothyroxine  (SYNTHROID ) 75 MCG tablet Take 75 mcg by mouth daily before breakfast.  02/19/19  Yes [provider]  losartan  (COZAAR ) 25 MG tablet Take 25 mg by mouth daily. 10/16/21  Yes [provider]  rosuvastatin (CRESTOR) 5 MG tablet Take 5 mg by mouth daily. 06/23/23 06/22/24 Yes [provider]  ticagrelor  (BRILINTA ) 90 MG TABS tablet TAKE ONE TABLET BY MOUTH TWICE DAILY 12/08/23  Yes Brown, Fallon E, NP  budesonide  (ENTOCORT EC ) 3 MG 24 hr capsule Take 9 mg by mouth daily. 11/12/21   [provider]   estradiol  (ESTRACE ) 0.1 MG/GM vaginal cream Insert 1/4 applicator full into vagina at night twice a week 10/21/22   Allen, Lauren G, NP  HYDROcodone -acetaminophen  (NORCO) 10-325 MG tablet Take 1 tablet by mouth every 6 (six) hours as needed for severe pain (pain score 7-10). 08/21/23   Schnier, Ninette Basque, MD  nitroGLYCERIN  (NITROSTAT ) 0.4 MG SL tablet Place under the tongue. Patient not taking: Reported on 07/16/2023 10/04/21   [provider]     Critical care provider statement:   Total critical care time: 33 minutes   Performed by: Jaclynn Mast MD   Critical care time was exclusive of separately billable procedures and treating other patients.   Critical care was necessary to treat or prevent imminent or life-threatening deterioration.   Critical care was time spent  personally by me on the following activities: development of treatment plan with patient and/or surrogate as well as nursing, discussions with consultants, evaluation of patient's response to treatment, examination of patient, obtaining history from patient or surrogate, ordering and performing treatments and interventions, ordering and review of laboratory studies, ordering and review of radiographic studies, pulse oximetry and re-evaluation of patient's condition.    Lyndall Bellot, M.D.  Pulmonary & Critical Care Medicine

## 2024-01-23 DIAGNOSIS — Z7901 Long term (current) use of anticoagulants: Secondary | ICD-10-CM

## 2024-01-23 DIAGNOSIS — I70222 Atherosclerosis of native arteries of extremities with rest pain, left leg: Secondary | ICD-10-CM | POA: Diagnosis not present

## 2024-01-23 DIAGNOSIS — Z9889 Other specified postprocedural states: Secondary | ICD-10-CM

## 2024-01-23 DIAGNOSIS — D500 Iron deficiency anemia secondary to blood loss (chronic): Secondary | ICD-10-CM | POA: Diagnosis not present

## 2024-01-23 DIAGNOSIS — I70229 Atherosclerosis of native arteries of extremities with rest pain, unspecified extremity: Secondary | ICD-10-CM | POA: Diagnosis not present

## 2024-01-23 DIAGNOSIS — I4891 Unspecified atrial fibrillation: Secondary | ICD-10-CM | POA: Diagnosis not present

## 2024-01-23 DIAGNOSIS — T82898A Other specified complication of vascular prosthetic devices, implants and grafts, initial encounter: Secondary | ICD-10-CM

## 2024-01-23 DIAGNOSIS — Z7982 Long term (current) use of aspirin: Secondary | ICD-10-CM

## 2024-01-23 LAB — RENAL FUNCTION PANEL
Albumin: 2.7 g/dL — ABNORMAL LOW (ref 3.5–5.0)
Anion gap: 4 — ABNORMAL LOW (ref 5–15)
BUN: 18 mg/dL (ref 8–23)
CO2: 22 mmol/L (ref 22–32)
Calcium: 8 mg/dL — ABNORMAL LOW (ref 8.9–10.3)
Chloride: 106 mmol/L (ref 98–111)
Creatinine, Ser: 0.74 mg/dL (ref 0.44–1.00)
GFR, Estimated: >60 mL/min (ref >60)
Glucose, Bld: 108 mg/dL — ABNORMAL HIGH (ref 70–99)
Phosphorus: 2.1 mg/dL — ABNORMAL LOW (ref 2.5–4.6)
Potassium: 4 mmol/L (ref 3.5–5.1)
Sodium: 132 mmol/L — ABNORMAL LOW (ref 135–145)

## 2024-01-23 LAB — CBC
HCT: 19.9 % — ABNORMAL LOW (ref 36.0–46.0)
Hemoglobin: 6.8 g/dL — ABNORMAL LOW (ref 12.0–15.0)
MCH: 31.5 pg (ref 26.0–34.0)
MCHC: 34.2 g/dL (ref 30.0–36.0)
MCV: 92.1 fL (ref 80.0–100.0)
Platelets: 102 10*3/uL — ABNORMAL LOW (ref 150–400)
RBC: 2.16 MIL/uL — ABNORMAL LOW (ref 3.87–5.11)
RDW: 17.7 % — ABNORMAL HIGH (ref 11.5–15.5)
WBC: 9.3 10*3/uL (ref 4.0–10.5)
nRBC: 0.2 % (ref 0.0–0.2)

## 2024-01-23 LAB — MAGNESIUM: Magnesium: 1.8 mg/dL (ref 1.7–2.4)

## 2024-01-23 LAB — HEMOGLOBIN AND HEMATOCRIT, BLOOD
HCT: 23 % — ABNORMAL LOW (ref 36.0–46.0)
Hemoglobin: 7.9 g/dL — ABNORMAL LOW (ref 12.0–15.0)

## 2024-01-23 LAB — PREPARE RBC (CROSSMATCH)

## 2024-01-23 MED ORDER — SODIUM CHLORIDE 0.9% IV SOLUTION: Freq: Once | INTRAVENOUS | Status: DC

## 2024-01-23 MED ORDER — SENNOSIDES-DOCUSATE SODIUM 8.6-50 MG PO TABS
1.0000 | ORAL_TABLET | Freq: Two times a day (BID) | ORAL | Status: DC
  Administered 2024-01-23 – 2024-01-27 (×5): 1 via ORAL
  Filled 2024-01-23 (×7): qty 1

## 2024-01-23 MED ORDER — POLYETHYLENE GLYCOL 3350 17 G PO PACK
17.0000 g | PACK | Freq: Two times a day (BID) | ORAL | Status: DC
  Administered 2024-01-23 (×2): 17 g via ORAL
  Administered 2024-01-25: 8 g via ORAL
  Administered 2024-01-26 – 2024-01-27 (×2): 17 g via ORAL
  Filled 2024-01-23 (×8): qty 1

## 2024-01-23 MED ORDER — K PHOS MONO-SOD PHOS DI & MONO 155-852-130 MG PO TABS
500.0000 mg | ORAL_TABLET | Freq: Once | ORAL | Status: AC
  Administered 2024-01-23: 500 mg via ORAL
  Filled 2024-01-23: qty 2

## 2024-01-23 MED ORDER — MAGNESIUM HYDROXIDE 400 MG/5ML PO SUSP
30.0000 mL | Freq: Every day | ORAL | Status: DC | PRN
  Administered 2024-01-27: 30 mL via ORAL
  Filled 2024-01-23: qty 30

## 2024-01-23 NOTE — Plan of Care (Signed)

## 2024-01-23 NOTE — Plan of Care (Signed)
   Problem: Education: Goal: Knowledge of General Education information will improve Description Including pain rating scale, medication(s)/side effects and non-pharmacologic comfort measures Outcome: Progressing   Problem: Clinical Measurements: Goal: Ability to maintain clinical measurements within normal limits will improve Outcome: Progressing Goal: Will remain free from infection Outcome: Progressing Goal: Respiratory complications will improve Outcome: Progressing Goal: Cardiovascular complication will be avoided Outcome: Progressing   Problem: Nutrition: Goal: Adequate nutrition will be maintained Outcome: Progressing   Problem: Coping: Goal: Level of anxiety will decrease Outcome: Progressing

## 2024-01-23 NOTE — Evaluation (Signed)
 Physical Therapy Evaluation Patient Details Name: Melanie Browning MRN: [id number] DOB: 09/12/1941 Today's Date: 01/23/2024  History of Present Illness  83 y/o female s/p L LE angiography with thrombectomy and stent placement on 01/20/24. PMH: PVD with critical limb ischemia, CAD s/p PCI to LAD 10/2020, HTN  Clinical Impression  Patient admitted following the above procedure. PTA, patient lives with husband at [facility] and was very independent. RN cleared PT to work with patient post transfusion. Patient with increased swelling noted to LLE with impaired sensation. Required minA for bed mobility and CGA-minA for sit to stand with RW. Complaining of dizziness in sitting and standing. Unable to progress mobility this date due to pain and dizziness. Patient will benefit from skilled PT services during acute stay to address listed deficits. Patient will benefit from ongoing therapy at discharge to maximize functional independence and safety.         If plan is discharge home, recommend the following: A little help with walking and/or transfers;A little help with bathing/dressing/bathroom;Assistance with cooking/housework;Assist for transportation;Help with stairs or ramp for entrance   Can travel by private vehicle   Yes    Equipment Recommendations Rolling Clariece Roesler (2 wheels);BSC/3in1  Recommendations for Other Services       Functional Status Assessment Patient has had a recent decline in their functional status and demonstrates the ability to make significant improvements in function in a reasonable and predictable amount of time.     Precautions / Restrictions Precautions Precautions: Fall Recall of Precautions/Restrictions: Intact Restrictions Weight Bearing Restrictions Per Provider Order: No      Mobility  Bed Mobility Overal bed mobility: Needs Assistance Bed Mobility: Supine to Sit, Sit to Supine     Supine to sit: Min assist Sit to supine: Min assist         Transfers Overall transfer level: Needs assistance Equipment used: Rolling Jomo Forand (2 wheels) Transfers: Sit to/from Stand Sit to Stand: Min assist, Contact guard assist           General transfer comment: stood from EOB x 2 with patient reporting dizziness and "wash of weakness" in standing both times. Unable to progress to taking steps this date due to pain    Ambulation/Gait                  Stairs            Wheelchair Mobility     Tilt Bed    Modified Rankin (Stroke Patients Only)       Balance Overall balance assessment: Needs assistance Sitting-balance support: No upper extremity supported, Feet supported Sitting balance-Leahy Scale: Fair     Standing balance support: Bilateral upper extremity supported, Reliant on assistive device for balance Standing balance-Leahy Scale: Poor                               Pertinent Vitals/Pain Pain Assessment Pain Assessment: Faces Faces Pain Scale: Hurts even more Pain Location: LLE Pain Descriptors / Indicators: Grimacing, Guarding, Discomfort Pain Intervention(s): Limited activity within patient's tolerance, Monitored during session, Repositioned    Home Living Family/patient expects to be discharged to:: Private residence Living Arrangements: Spouse/significant other Available Help at Discharge: Family;Available 24 hours/day Type of Home: Independent living facility Home Access: Level entry       Home Layout: One level Home Equipment: Agricultural consultant (2 wheels);Rollator (4 wheels) Additional Comments: lives at [facility]    Prior Function Prior  Level of Function : Independent/Modified Independent;Driving             Mobility Comments: not using AD prior to admission, drives, exercises, walks dogs. ADLs Comments: independent     Extremity/Trunk Assessment   Upper Extremity Assessment Upper Extremity Assessment: Overall WFL for tasks assessed    Lower Extremity  Assessment Lower Extremity Assessment: Generalized weakness;LLE deficits/detail LLE Sensation: decreased light touch    Cervical / Trunk Assessment Cervical / Trunk Assessment: Normal  Communication   Communication Communication: No apparent difficulties    Cognition Arousal: Alert Behavior During Therapy: WFL for tasks assessed/performed, Anxious   PT - Cognitive impairments: No apparent impairments                         Following commands: Intact       Cueing Cueing Techniques: Verbal cues     General Comments      Exercises     Assessment/Plan    PT Assessment Patient needs continued PT services  PT Problem List Decreased strength;Decreased range of motion;Decreased activity tolerance;Decreased balance;Decreased mobility;Decreased safety awareness;Decreased knowledge of precautions;Impaired sensation       PT Treatment Interventions DME instruction;Gait training;Functional mobility training;Therapeutic activities;Therapeutic exercise;Balance training;Patient/family education    PT Goals (Current goals can be found in the Care Plan section)  Acute Rehab PT Goals Patient Stated Goal: to get stronger PT Goal Formulation: With patient Time For Goal Achievement: 02/06/24 Potential to Achieve Goals: Good    Frequency Min 3X/week     Co-evaluation               AM-PAC PT "6 Clicks" Mobility  Outcome Measure Help needed turning from your back to your side while in a flat bed without using bedrails?: A Little Help needed moving from lying on your back to sitting on the side of a flat bed without using bedrails?: A Little Help needed moving to and from a bed to a chair (including a wheelchair)?: A Little Help needed standing up from a chair using your arms (e.g., wheelchair or bedside chair)?: A Little Help needed to walk in hospital room?: A Lot Help needed climbing 3-5 steps with a railing? : A Lot 6 Click Score: 16    End of Session    Activity Tolerance: Patient tolerated treatment well Patient left: in bed;with call bell/phone within reach Nurse Communication: Mobility status PT Visit Diagnosis: Unsteadiness on feet (R26.81);Muscle weakness (generalized) (M62.81);Other abnormalities of gait and mobility (R26.89)    Time: 1320-1350 PT Time Calculation (min) (ACUTE ONLY): 30 min   Charges:   PT Evaluation $PT Eval Moderate Complexity: 1 Mod   PT General Charges $$ ACUTE PT VISIT: 1 Visit         Janine Melbourne, PT, DPT Physical Therapist - [facility]  [facility]   Rony Ratz A Johnnette Laux 01/23/2024, 2:32 PM

## 2024-01-23 NOTE — Progress Notes (Signed)
 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 She developed new onset A-fib RVR. Pharmacy is asked to follow and replace electrolytes while in CCU   Goal of Therapy:  Potassium 4.0 - 5.1 mmol/L Magnesium  2.0 - 2.4 mg/dL All Other Electrolytes WNL  Plan:  ---500 mg  po K-Phos (contains phosphorus 16 mMol, potassium 2.2 mEq.) ---recheck electrolytes in am  Adalberto Acton ,PharmD Clinical Pharmacist 01/23/2024 8:40 AM

## 2024-01-23 NOTE — Progress Notes (Signed)
 NAME:  Shonia Skilling, MRN:  [id number], DOB:  1941/05/06, LOS: 3 ADMISSION DATE:  01/20/2024, CONSULTATION DATE:  01/20/24 REFERRING MD:  Dr. Prescilla Brod, CHIEF COMPLAINT: Ischemic leg     History of Present Illness:  83 yo F presenting to 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 She returned for outpatient follow up after left common femoral artery to posterior tibial artery bypass in October 2024 with complaints of worsening claudication symptoms as well as numbness and pain at night when she lies down. At outpatient follow up on 4/15 there were no open wounds noted but studies showed that the graft had occluded just past the region with some diminished monophasic flow in the distal posterior tibial artery.  She was instructed to present on 4/22 for scheduled angiography of the LLE with hope for limb salvage.  Hospital course: She underwent LLE angiography with angioplasty and stent placement left SFA and popliteal artery, angioplasty of the left posterior tibial artery, thrombectomy of left SFA, popliteal/ tibioperoneal trunk/posterior tibial. Thrombolysis was initiated with TPA and heparin  to continue overnight. Plans for vascular intervention on 01/21/24. Overnight patient having severe pain and developed new onset A-fib RVR. Patient asymptomatic bedside, only complaints are some anxiety over current clinical condition and outcomes and severe pain.   Significant labs: (Labs/ Imaging personally reviewed) I, Recardo Canal Rust-Chester, AGACNP-BC, personally viewed and interpreted this ECG. EKG Interpretation: Date: 01/20/24, EKG Time: 22:41, Rate: 132, Rhythm: A-fib RVR, QRS Axis:  RAD, Intervals: normal, ST/T Wave abnormalities: ST depression inferior and leads v2-v6, Narrative Interpretation: A-fib RVR with signs of ischemia Chemistry: Na+:137, K+: 3.2, BUN/Cr.: 21/ 0.52, Serum CO2/ AG: 19/ 11, Mg: 1.5 Hematology: WBC: 6.2,  Hgb: 11.7,   PCCM consulted for assistance in management and monitoring due to new onset A-fib RVR in the setting of severe reperfusion pain.  Pertinent  Medical History  Aortic Stenosis CAD HFpEF HLD HPV HLD Hypothyroidism Macrocytic anemia PAD Polymyalgia rheumatica  Significant Hospital Events: Including procedures, antibiotic start and stop dates in addition to other pertinent events   01/20/24: Admit to ICU by vascular surgery with ischemic LLE s/p angiogram, stent placement and lytic therapy overnight. New onset A-fib RVR, PCCM consulted 01/21/24- patient is improved, electrolytes better. Adequate UOP today.  Atrial fibrillation is rate controlled.  01/22/24- -patient had acute blood loss overnight with drop in h/h s/p transfusion. Vital signs stable this morning.  Patient was transfused second unit today 01/23/24- patient with hb 6.8 this am, now s/p transfusion.  She reports swelling of hips but overall is stable and improved.  She is in no distress.  Patient optimized for TRH transfer to hospitalist service.    Objective   Blood pressure (!) 132/114, pulse 96, temperature 98 F (36.7 C), temperature source Oral, resp. rate 17, height 5\' 8"  (1.727 m), weight 66 kg, SpO2 100%.        Intake/Output Summary (Last 24 hours) at 01/23/2024 1445 Last data filed at 01/23/2024 1215 Gross per 24 hour  Intake 2415.75 ml  Output 2525 ml  Net -109.25 ml   Filed Weights   01/20/24 1433  Weight: 66 kg    Examination: General: Adult female, acutely ill, lying in bed, NAD HEENT: MM pink/moist, anicteric, atraumatic, neck supple Neuro: A&O x 4, able to follow commands, PERRL +3, MAE CV: s1s2 irregular, A-fib RVR on monitor, no r/m/g Pulm: Regular, non labored on RA, breath sounds clear-BUL & clear/  diminished-BLL GI: soft, rounded, non tender, bs x 4 GU: foley in place with clear yellow urine Skin: swelling of hips  Extremities: warm/dry, pulses + 1 R/P(R side), L side absent  pedal, doppler tibial +1 radial, no edema noted  Resolved Hospital Problem list     Assessment & Plan:  New Onset Atrial Fibrillation with Rapid Ventricular Response- now rate controlled  ST depression inferior & leads V2-V6 CHA2DS2-VASc score: 5 points - STAT BMP, Mg, Phos. Recent H&H stable - 1 L LR bolus ordered, will add one time dilaudid  dose with valium  IV to see if pain can be better controlled - Trend troponin, f/u TSH & thyroid  panel - Echocardiogram ordered - f/u EKG in AM - Consider cardiology consultation depending on w/u above - Continuous cardiac monitoring   Severe anemia    - s/p prbc transfusion h/h recheck in process  Hypokalemia Hypomagnesemia - Daily BMP, replace electrolytes PRN - 4 g of Mg & 60 meq K+ ordered  PAD Left Ischemic Leg - vascular managing - continue lytics and heparin  overnight - monitor oozing, CBC & fibrinogen    Best Practice (right click and "Reselect all SmartList Selections" daily)  Diet/type: clear liquids DVT prophylaxis systemic heparin  Pressure ulcer(s): N/A GI prophylaxis: N/A Lines: N/A Foley:  Yes, and it is still needed Code Status:  full code Last date of multidisciplinary goals of care discussion [per primary 01/20/24]  Labs   CBC: Recent Labs  Lab 01/21/24 0123 01/21/24 0730 01/21/24 1328 01/22/24 0506 01/22/24 1345 01/22/24 1954 01/23/24 0754  WBC 9.8 8.3 9.5 8.2  --   --  9.3  HGB 10.9* 9.4* 8.8* 5.9* 6.5* 7.3* 6.8*  HCT 32.7* 28.7* 27.1* 18.1* 18.9* 21.0* 19.9*  MCV 99.4 100.3* 103.0* 102.3*  --   --  92.1  PLT 155 156 146* 121*  --   --  102*    Basic Metabolic Panel: Recent Labs  Lab 01/20/24 1425 01/20/24 2226 01/21/24 0730 01/22/24 0506 01/23/24 0754  NA  --  137 133* 131* 132*  K  --  3.2* 4.6 4.7 4.0  CL  --  107 105 103 106  CO2  --  19* 22 22 22   GLUCOSE  --  109* 117* 134* 108*  BUN 23 21 20  28* 18  CREATININE 0.71 0.52 0.74 0.98 0.74  CALCIUM   --  8.7* 8.2* 7.9* 8.0*  MG  --   1.5* 2.3 2.2 1.8  PHOS  --  3.0 2.8 3.3 2.1*   GFR: Estimated Creatinine Clearance: 55.6 mL/min (by C-G formula based on SCr of 0.74 mg/dL). Recent Labs  Lab 01/21/24 0730 01/21/24 1328 01/22/24 0506 01/23/24 0754  WBC 8.3 9.5 8.2 9.3    Liver Function Tests: Recent Labs  Lab 01/20/24 2226 01/22/24 0506 01/23/24 0754  AST 34  --   --   ALT 23  --   --   ALKPHOS 44  --   --   BILITOT 1.1  --   --   PROT 6.5  --   --   ALBUMIN 3.5 2.8* 2.7*   No results for input(s): "LIPASE", "AMYLASE" in the last 168 hours. No results for input(s): "AMMONIA" in the last 168 hours.  ABG No results found for: "PHART", "PCO2ART", "PO2ART", "HCO3", "TCO2", "ACIDBASEDEF", "O2SAT"   Coagulation Profile: No results for input(s): "INR", "PROTIME" in the last 168 hours.  Cardiac Enzymes: No results for input(s): "CKTOTAL", "CKMB", "CKMBINDEX", "TROPONINI" in the last 168 hours.  HbA1C: No results  found for: "HGBA1C"  CBG: Recent Labs  Lab 01/20/24 2048  GLUCAP 77    Review of Systems: positives in BOLD  Gen: Denies fever, chills, weight change, fatigue, night sweats, bilateral lower extremity pain/ severe pain Left foot and ankle HEENT: Denies blurred vision, double vision, hearing loss, tinnitus, sinus congestion, rhinorrhea, sore throat, neck stiffness, dysphagia PULM: Denies shortness of breath, cough, sputum production, hemoptysis, wheezing CV: Denies chest pain, edema, orthopnea, paroxysmal nocturnal dyspnea, palpitations GI: Denies abdominal pain, nausea, vomiting, diarrhea, hematochezia, melena, constipation, change in bowel habits GU: Denies dysuria, hematuria, polyuria, oliguria, urethral discharge Endocrine: Denies hot or cold intolerance, polyuria, polyphagia or appetite change Derm: Denies rash, dry skin, scaling or peeling skin change Heme: Denies easy bruising, bleeding, bleeding gums Neuro: Denies headache, numbness, weakness, slurred speech, loss of memory or  consciousness  Past Medical History:  She,  has a past medical history of Aortic atherosclerosis (HCC), Aortic stenosis (06/26/2023), Arthritis, 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), CAD (coronary artery disease) (11/09/2020), Cervical spondylosis, Colitis, 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), Diastolic dysfunction (06/26/2023), Dry eye, Dyslipidemia, History of cervical cancer, HPV (human papilloma virus) infection, Hyperlipidemia, Hypothyroidism, IGT (impaired glucose tolerance), Left upper lobe pulmonary nodule, Long term current use of aspirin , Long term current use of ticagrelor  therapy, Macrocytic anemia, Osteoporosis, PAD (peripheral artery disease) (HCC), Polymyalgia rheumatica (HCC), Skin cancer of nose, Transaminitis, and Vaginal dysplasia.   Surgical History:   Past Surgical History:  Procedure Laterality Date   COLONOSCOPY WITH PROPOFOL  N/A 10/04/2020   Procedure: COLONOSCOPY WITH PROPOFOL ;  Surgeon: Toledo, Alphonsus Jeans, MD;  Location: [facility];  Service: Gastroenterology;  Laterality: N/A;   COLPOSCOPY     CORONARY STENT INTERVENTION N/A 11/15/2020   Procedure: CORONARY STENT INTERVENTION;  Surgeon: Antonette Batters, MD;  Location: [facility];  Service: Cardiovascular;  Laterality: N/A;   FEMORAL-POPLITEAL BYPASS GRAFT Left 07/16/2023   Procedure: BYPASS GRAFT FEMORAL-POPLITEAL ARTERY (FEMORAL-POSTERIOR TIBIAL BYPASS W/ SAPHENOUS VEIN- [id number]);  Surgeon: Jackquelyn Mass, MD;  Location: [facility] ORS;  Service: Vascular;  Laterality: Left;   KNEE ARTHROSCOPY Left    LAPAROSCOPIC HYSTERECTOMY     LEFT HEART CATH AND CORONARY ANGIOGRAPHY N/A 11/15/2020   Procedure: LEFT HEART CATH AND CORONARY ANGIOGRAPHY;  Surgeon: Antonette Batters, MD;  Location: [facility];  Service: Cardiovascular;  Laterality: N/A;   LOWER EXTREMITY ANGIOGRAPHY Left 09/05/2020   Procedure: LOWER EXTREMITY  ANGIOGRAPHY;  Surgeon: Jackquelyn Mass, MD;  Location: [facility];  Service: Cardiovascular;  Laterality: Left;   LOWER EXTREMITY ANGIOGRAPHY Left 04/10/2021   Procedure: LOWER EXTREMITY ANGIOGRAPHY;  Surgeon: Jackquelyn Mass, MD;  Location: [facility];  Service: Cardiovascular;  Laterality: Left;   LOWER EXTREMITY ANGIOGRAPHY Left 11/20/2021   Procedure: Lower Extremity Angiography;  Surgeon: Jackquelyn Mass, MD;  Location: [facility];  Service: Cardiovascular;  Laterality: Left;   LOWER EXTREMITY ANGIOGRAPHY Left 06/05/2022   Procedure: Lower Extremity Angiography;  Surgeon: Jackquelyn Mass, MD;  Location: [facility];  Service: Cardiovascular;  Laterality: Left;   LOWER EXTREMITY ANGIOGRAPHY Left 07/23/2022   Procedure: Lower Extremity Angiography;  Surgeon: Jackquelyn Mass, MD;  Location: [facility];  Service: Cardiovascular;  Laterality: Left;   LOWER EXTREMITY ANGIOGRAPHY Left 12/27/2022   Procedure: Lower Extremity Angiography;  Surgeon: Jackquelyn Mass, MD;  Location: [facility];  Service: Cardiovascular;  Laterality: Left;  LOWER EXTREMITY ANGIOGRAPHY Left 06/17/2023   Procedure: Lower Extremity Angiography;  Surgeon: Jackquelyn Mass, MD;  Location: [facility];  Service: Cardiovascular;  Laterality: Left;   LOWER EXTREMITY ANGIOGRAPHY Left 01/20/2024   Procedure: Lower Extremity Angiography;  Surgeon: Jackquelyn Mass, MD;  Location: [facility];  Service: Cardiovascular;  Laterality: Left;   LOWER EXTREMITY ANGIOGRAPHY Left 01/21/2024   Procedure: Lower Extremity Angiography;  Surgeon: Jackquelyn Mass, MD;  Location: [facility];  Service: Cardiovascular;  Laterality: Left;   LOWER EXTREMITY INTERVENTION Left 01/20/2024   Procedure: LOWER EXTREMITY INTERVENTION;  Surgeon: Jackquelyn Mass, MD;  Location: [facility];  Service: Cardiovascular;  Laterality: Left;    TOTAL ABDOMINAL HYSTERECTOMY       Social History:   reports that she has never smoked. She has never used smokeless tobacco. She reports current alcohol  use of about 1.0 standard drink of alcohol  per week. She reports that she does not use drugs.   Family History:  Her family history includes Cancer in her mother; Heart attack (age of onset: 16) in her father; Heart disease in her father; Heart failure in her mother; Pancreatic cancer in her mother.   Allergies Allergies  Allergen Reactions   Lactose Other (See Comments) and Nausea And Vomiting    Gi upset and diarrhea  Other reaction(s): Other (See Comments)  Gi upset and diarrhea  Other reaction(s): Other (See Comments) Gi upset and diarrhea Gi upset and diarrhea Gi upset and diarrhea    Gi upset and diarrhea    Gi upset and diarrhea Gi upset and diarrhea Gi upset and diarrhea  Gi upset and diarrhea    Gi upset and diarrhea  Other reaction(s): Other (See Comments) Gi upset and diarrhea Gi upset and diarrhea Gi upset and diarrhea  Gi upset and diarrhea  Gi upset and diarrhea Gi upset and diarrhea Gi upset and diarrhea     Home Medications  Prior to Admission medications   Medication Sig Start Date End Date Taking? Authorizing Provider  aspirin  EC 81 MG tablet Take 81 mg by mouth every evening. Swallow whole.   Yes [provider]  B Complex Vitamins (VITAMIN B-COMPLEX) TABS Take 1 tablet by mouth daily.   Yes [provider]  budesonide  (ENTOCORT EC ) 3 MG 24 hr capsule Take 3 capsules (9 mg total) by mouth every morning. 09/26/23  Yes Marnee Sink, MD  diphenoxylate -atropine  (LOMOTIL ) 2.5-0.025 MG tablet TAKE 1 TABLET BY MOUTH 3 TIMES DAILY AS NEEDED FOR DIARRHEA. 09/12/21  Yes Vanga, Elson Halon, MD  levothyroxine  (SYNTHROID ) 75 MCG tablet Take 75 mcg by mouth daily before breakfast.  02/19/19  Yes [provider]  losartan  (COZAAR ) 25 MG tablet Take 25 mg by mouth daily. 10/16/21  Yes [provider]  rosuvastatin (CRESTOR) 5 MG tablet Take 5 mg by mouth daily. 06/23/23 06/22/24 Yes [provider]  ticagrelor  (BRILINTA ) 90 MG TABS tablet TAKE ONE TABLET BY MOUTH TWICE DAILY 12/08/23  Yes Brown, Fallon E, NP  budesonide  (ENTOCORT EC ) 3 MG 24 hr capsule Take 9 mg by mouth daily. 11/12/21   [provider]  estradiol  (ESTRACE ) 0.1 MG/GM vaginal cream Insert 1/4 applicator full into vagina at night twice a week 10/21/22   Allen, Lauren G, NP  HYDROcodone -acetaminophen  (NORCO) 10-325 MG tablet Take 1 tablet by mouth every 6 (six) hours as needed for severe pain (pain score 7-10). 08/21/23   Schnier, Ninette Basque, MD  nitroGLYCERIN  (NITROSTAT ) 0.4 MG SL tablet Place under the tongue. Patient not taking: Reported on 07/16/2023 10/04/21   [provider]     Critical care provider statement:   Total critical care time: 33 minutes   Performed by: Jaclynn Mast MD   Critical care time was exclusive of separately billable procedures and treating other patients.   Critical care was necessary to treat or prevent imminent or life-threatening deterioration.   Critical care was time spent personally by me on the following activities: development of treatment plan with patient and/or surrogate as well as nursing, discussions with consultants, evaluation of patient's response to treatment, examination of patient, obtaining history from patient or surrogate, ordering and performing treatments and interventions, ordering and review of laboratory studies, ordering and review of radiographic studies, pulse oximetry and re-evaluation of patient's condition.    Arianah Torgeson, M.D.  Pulmonary & Critical Care Medicine

## 2024-01-23 NOTE — Progress Notes (Signed)
 Progress Note    01/23/2024 4:38 PM 2 Days Post-Op  Subjective:  Melanie Browning is an 83 year old female now status postop day 1 from left lower extremity angiogram and lysis catheter with transluminal angioplasty and stent placement to the left SFA, transluminal angioplasty and stent placement to the left tibioperoneal trunk and proximal posterior tibial artery and coil embolization of the venous branch of the failed saphenous vein bypass using Ruby coils.   On exam this morning patient resting comfortably in bed. Patients right groin remains painful and erythremic. Dressing clean dry and intact. No oozing at this time. She also endorses that she is having difficulty moving or lifting her left leg as it feels heavy.  There is noticeable +1 to +2 edema with reperfusion syndrome to her leg that received the lysis and now the stenting.   Patient noted to have converted to atrial fibrillation with RVR during procedure yesterday.  Cardiology was consulted.  They are okay with current anticoagulation.  Patient was initially placed on amiodarone  infusion.  This was changed to amiodarone  200 mg twice daily.  Patient is currently in normal sinus rhythm.  Review of Systems  Constitutional:  Constitutional negative. Respiratory: Respiratory negative.  Cardiovascular: Cardiovascular negative.  GI: Gastrointestinal negative.  GU: Genitourinary negative. Musculoskeletal: Musculoskeletal negative.  Skin: Skin negative.  Neurological: Neurological negative. Psychiatric: Psychiatric negative.  All other systems reviewed and are negative  Vitals:   01/23/24 1500 01/23/24 1600  BP: (!) 133/58 (!) 133/51  Pulse:  93  Resp: 16 18  Temp:  97.8 F (36.6 C)  SpO2:  99%   Physical Exam: Cardiac:  RRR, normal S1 and S2.  No rubs clicks gallops or murmurs noted this morning. Lungs: [name] on auscultation throughout but diminished in the bases.  No rales rhonchi or wheezing noted.  Nonlabored  breathing. Incisions: Right groin incision with pressure dressing in place due to oozing overnight. Extremities: Bilateral lower extremities warm to touch both with positive Doppler DP and PT pulses that are strong. Abdomen: Positive bowel sounds throughout, soft, nontender and nondistended. Neurologic: Alert and oriented x 3, answers all questions and follows commands appropriately.  CBC    Component Value Date/Time   WBC 9.3 01/23/2024 0754   RBC 2.16 (L) 01/23/2024 0754   HGB 7.9 (L) 01/23/2024 1419   HGB 13.2 06/17/2023 0931   HCT 23.0 (L) 01/23/2024 1419   HCT 39.8 06/17/2023 0931   PLT 102 (L) 01/23/2024 0754   PLT 181 06/17/2023 0931   MCV 92.1 01/23/2024 0754   MCV 99 (H) 06/17/2023 0931   MCH 31.5 01/23/2024 0754   MCHC 34.2 01/23/2024 0754   RDW 17.7 (H) 01/23/2024 0754   RDW 13.1 06/17/2023 0931   LYMPHSABS 0.8 01/03/2023 1142   MONOABS 0.5 01/03/2023 1142   EOSABS 0.1 01/03/2023 1142   BASOSABS 0.0 01/03/2023 1142    BMET    Component Value Date/Time   NA 132 (L) 01/23/2024 0754   K 4.0 01/23/2024 0754   CL 106 01/23/2024 0754   CO2 22 01/23/2024 0754   GLUCOSE 108 (H) 01/23/2024 0754   BUN 18 01/23/2024 0754   CREATININE 0.74 01/23/2024 0754   CALCIUM  8.0 (L) 01/23/2024 0754   GFRNONAA >60 01/23/2024 0754    INR No results found for: "INR"   Intake/Output Summary (Last 24 hours) at 01/23/2024 1638 Last data filed at 01/23/2024 1215 Gross per 24 hour  Intake 2181.15 ml  Output 2525 ml  Net -343.85 ml  Assessment/Plan:  83 y.o. female is s/p  left lower extremity angiogram with lysis catheter and multiple angioplasty with stent placement.  Also noted to have placed Ruby coils for failed saphenous vein bypass graft.  2 Days Post-Op   PLAN Continue ASA 81 mg p.o. daily, Brilinta  90 mg p.o. daily, and Eliquis  2.5 mg twice daily. Continue with Foley catheter until patient able to ambulate. Continue to monitor right groin for bleeding  Advance diet  as tolerated. Pain medication as needed Discontinue IV fluids as patient is taking p.o. Patient to receive 1 unit packed red blood cells today for hemoglobin of 6.8 CBC and BMP in the morning.   DVT prophylaxis: ASA 81 mg p.o. daily, Brilinta  90 mg p.o. daily, and Eliquis  2.5 mg twice daily.   Annamaria [facility]rette [facility] 01/23/2024 4:38 PM

## 2024-01-23 NOTE — Progress Notes (Signed)
 [facility]  - [place]   PATIENT NAME: Melanie Browning    MR#:  [id number]  DATE OF BIRTH:  Feb 14, 1941  SUBJECTIVE:  seen earlier in the ICU. No family at bedside. Patient underwent vascular procedure for left lower extremity rest pain. Currently complains of weakness.  VITALS:  Blood pressure (!) 142/70, pulse 97, temperature 97.8 F (36.6 C), temperature source Axillary, resp. rate 13, height 5\' 8"  (1.727 m), weight 66 kg, SpO2 99%.  PHYSICAL EXAMINATION:   GENERAL:  83 y.o.-year-old patient with no acute distress.  LUNGS: Normal breath sounds bilaterally, no wheezing CARDIOVASCULAR: S1, S2 normal. No murmur   ABDOMEN: Soft, nontender, nondistended. Bowel sounds present.  EXTREMITIES: left LE edema + NEUROLOGIC: nonfocal  patient is alert and awake   LABORATORY PANEL:  CBC Recent Labs  Lab 01/23/24 0754 01/23/24 1419  WBC 9.3  --   HGB 6.8* 7.9*  HCT 19.9* 23.0*  PLT 102*  --     Chemistries  Recent Labs  Lab 01/20/24 2226 01/21/24 0730 01/23/24 0754  NA 137   < > 132*  K 3.2*   < > 4.0  CL 107   < > 106  CO2 19*   < > 22  GLUCOSE 109*   < > 108*  BUN 21   < > 18  CREATININE 0.52   < > 0.74  CALCIUM  8.7*   < > 8.0*  MG 1.5*   < > 1.8  AST 34  --   --   ALT 23  --   --   ALKPHOS 44  --   --   BILITOT 1.1  --   --    < > = values in this interval not displayed.    Assessment and Plan  83 y.o. 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   New a fib with RVR now rate controlled with STT changes NSTEMI -- history of coronary artery disease with prior stent placement to LAD in 2022 -- cardiology consultation appreciated -- patient is status post left lower extremity vascular procedure -- converted to sinus rhythm with  amiodarone  infusion --  on IV heparin  drip--> ASA +Brilinta  -- outpatient workup for chest pain. Patient currently is chest pain free  Severe peripheral arterial disease lower extremity post procedure groin hematoma --s/p Left lower extremity angiography third order catheter placement             3.  Percutaneous transluminal angioplasty and stent placement left SFA and popliteal artery.             4.  Percutaneous transluminal angioplasty left posterior tibial artery.             5.  Thrombectomy using the laser left SFA, popliteal and tibioperoneal trunk/posterior tibial             6.  Initiation of thrombolysis -- transfuse as needed  Electrolyte abnormality -- replace with electrolyte protocol by [facility]  Hyperlipidemia -- patient has declined therapy in the past  Procedures:as above Family communication :none today    TOTAL TIME TAKING CARE OF THIS PATIENT: 35 minutes.  >50% time spent on counselling and coordination of care  Note: This dictation was prepared with Dragon dictation along with smaller phrase technology. Any transcriptional errors that result from this process are  unintentional.  Melvinia Stager M.D    [facility]   CC: Primary care physician; Eartha Gold, MD

## 2024-01-23 NOTE — Progress Notes (Signed)
 Rounding Note    Patient Name: Melanie Browning Date of Encounter: 01/23/2024  [facility] Cardiologist: None   Subjective   Patient seen on a.m. rounds.  Denies any chest pain or worsening shortness of breath.  Continues to have numbness and swelling to the bilateral lower extremities with left to the right.  No A-fib noted on telemetry overnight.  Hemoglobin is 6.8 this morning with some blood infusing on rounds.  Inpatient Medications    Scheduled Meds:  sodium chloride    Intravenous Once   sodium chloride    Intravenous Once   amiodarone   200 mg Oral BID   apixaban   2.5 mg Oral BID   aspirin  EC  81 mg Oral Daily   Chlorhexidine  Gluconate Cloth  6 each Topical Daily   levothyroxine   75 mcg Oral QAC breakfast   lidocaine -EPINEPHrine   10 mL Infiltration Once   pantoprazole  (PROTONIX ) IV  40 mg Intravenous QHS   polyethylene glycol  17 g Oral BID   senna-docusate  1 tablet Oral BID   ticagrelor   90 mg Oral BID   Continuous Infusions:  PRN Meds: acetaminophen , ALPRAZolam , diphenoxylate -atropine , hydrALAZINE , hydrALAZINE , HYDROmorphone  (DILAUDID ) injection, labetalol , midazolam , nitroGLYCERIN , ondansetron  (ZOFRAN ) IV, oxyCODONE    Vital Signs    Vitals:   01/23/24 0941 01/23/24 1000 01/23/24 1002 01/23/24 1015  BP:  101/84 (!) 112/58 (!) 112/58  Pulse: 94 95 100 100  Resp: 18 19 14 14   Temp: 97.8 F (36.6 C) 98.8 F (37.1 C) 98.8 F (37.1 C)   TempSrc: Oral Oral    SpO2: 100% 98%  96%  Weight:      Height:        Intake/Output Summary (Last 24 hours) at 01/23/2024 1101 Last data filed at 01/23/2024 0800 Gross per 24 hour  Intake 2574.99 ml  Output 2650 ml  Net -75.01 ml      01/20/2024    2:33 PM 12/24/2023    9:40 AM 12/01/2023    2:10 PM  Last 3 Weights  Weight (lbs) 145 lb 6.4 oz 141 lb 1.5 oz 142 lb  Weight (kg) 65.953 kg 64 kg 64.411 kg      Telemetry    Sinus with rates of 80-100- Personally Reviewed  ECG    No new tracings- Personally  Reviewed  Physical Exam   GEN: No acute distress.   Neck: No JVD Cardiac: RRR, no murmurs, rubs, or gallops.  Respiratory: Clear to auscultation bilaterally. GI: Soft, nontender, non-distended  MS: left leg 3+ edema right leg 2+ edema; No deformity. Neuro:  Nonfocal  Psych: Normal affect   Labs    High Sensitivity Troponin:   Recent Labs  Lab 01/21/24 0123 01/21/24 0510 01/21/24 0734 01/21/24 1145 01/21/24 1328  TROPONINIHS 166* 653* 866* 1,107* 1,018*     Chemistry Recent Labs  Lab 01/20/24 2226 01/21/24 0730 01/22/24 0506 01/23/24 0754  NA 137 133* 131* 132*  K 3.2* 4.6 4.7 4.0  CL 107 105 103 106  CO2 19* 22 22 22   GLUCOSE 109* 117* 134* 108*  BUN 21 20 28* 18  CREATININE 0.52 0.74 0.98 0.74  CALCIUM  8.7* 8.2* 7.9* 8.0*  MG 1.5* 2.3 2.2 1.8  PROT 6.5  --   --   --   ALBUMIN 3.5  --  2.8* 2.7*  AST 34  --   --   --   ALT 23  --   --   --   ALKPHOS 44  --   --   --  BILITOT 1.1  --   --   --   GFRNONAA >60 >60 58* >60  ANIONGAP 11 6 6  4*    Lipids No results for input(s): "CHOL", "TRIG", "HDL", "LABVLDL", "LDLCALC", "CHOLHDL" in the last 168 hours.  Hematology Recent Labs  Lab 01/21/24 1328 01/22/24 0506 01/22/24 1345 01/22/24 1954 01/23/24 0754  WBC 9.5 8.2  --   --  9.3  RBC 2.63* 1.77*  --   --  2.16*  HGB 8.8* 5.9* 6.5* 7.3* 6.8*  HCT 27.1* 18.1* 18.9* 21.0* 19.9*  MCV 103.0* 102.3*  --   --  92.1  MCH 33.5 33.3  --   --  31.5  MCHC 32.5 32.6  --   --  34.2  RDW 14.5 14.6  --   --  17.7*  PLT 146* 121*  --   --  102*   Thyroid   Recent Labs  Lab 01/21/24 0730  TSH 2.860  2.758  FREET4 0.98    BNPNo results for input(s): "BNP", "PROBNP" in the last 168 hours.  DDimer No results for input(s): "DDIMER" in the last 168 hours.   Radiology      Cardiac Studies   2D echo 01/21/2024 1. Left ventricular ejection fraction, by estimation, is 60 to 65%. The  left ventricle has normal function. The left ventricle has no regional  wall  motion abnormalities. Left ventricular diastolic parameters were  normal.   2. Right ventricular systolic function is normal. The right ventricular  size is normal. There is normal pulmonary artery systolic pressure. The  estimated right ventricular systolic pressure is 23.5 mmHg.   3. The mitral valve is normal in structure. No evidence of mitral valve  regurgitation. No evidence of mitral stenosis.   4. The aortic valve is normal in structure. There is mild calcification  of the aortic valve. Aortic valve regurgitation is not visualized. Aortic  valve sclerosis/calcification is present, without any evidence of aortic  stenosis. Aortic valve mean  gradient measures 12.5 mmHg.   5. The inferior vena cava is normal in size with greater than 50%  respiratory variability, suggesting right atrial pressure of 3 mmHg.   PV procedure April 22   Percutaneous transluminal angioplasty and stent placement left SFA to 6 mm             3.  Percutaneous transluminal angioplasty and stent placement left tibioperoneal trunk and proximal posterior tibial artery with Esprit stents dilated to 3.5 mm distally and 4 mm proximally             4.  Introduction catheter into the venous branch of the failed saphenous vein bypass             5.  Coil embolization of the venous branch of the failed saphenous vein bypass using Ruby coils  Patient Profile     83 y.o. 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  Assessment & Plan    NSTEMI -In the setting of atrial fibrillation with RVR, known coronary artery disease with prior stent to the LAD in 2/22 - Post intervention to the left lower extremity on tPA -Converted to sinus rhythm on amiodarone  infusion -Plan to transition heparin  infusion to aspirin , Brilinta ,  reduced dose apixaban  -No immediate plans for ischemic workup -Can consider outpatient ischemic workup for symptoms concerning for angina -  Currently she remains chest pain-free  Atrial fibrillation with RVR - Exacerbated by severe left flank pain, postprocedure 4/22, noted to be hypokalemic with potassium 3.2, and hypertensive urgency - Converted to sinus rhythm on amiodarone  infusion -Will continue amiodarone  200 mg twice daily -Continued on reduced dose apixaban , Brilinta , and aspirin  -Continue with telemetry monitoring  Hypotension -Blood pressure 112/58 -In the setting of groin bleed postprocedure -Vital signs per unit protocol  PAD -Severe lower extremity arterial disease - DVT/intervention April 22 April 24 as detailed above -Postprocedure groin hematoma -Bilateral leg pain -Continued on current medication regimen -Ongoing management by vascular  Hyperlipidemia -Previously declined therapy and stopping Lipitor and ezetimibe  around -Total cholesterol 323 and LDL 163 -Continues to remain hesitant to start PSK 9 inhibitor  Anemia -Hemoglobin on 4/24 5.9 - Transfused 2 units of PRBCs -Hemoglobin this morning 6.8 -Continuing with blood transfusions -continue to trend CBC's      For questions or updates, please contact  [facility] Please consult [email or web address] for contact info under        Signed, Caelen Reierson, NP  01/23/2024, 11:01 AM

## 2024-01-24 DIAGNOSIS — D500 Iron deficiency anemia secondary to blood loss (chronic): Secondary | ICD-10-CM | POA: Diagnosis not present

## 2024-01-24 DIAGNOSIS — I2510 Atherosclerotic heart disease of native coronary artery without angina pectoris: Secondary | ICD-10-CM | POA: Diagnosis not present

## 2024-01-24 DIAGNOSIS — I4891 Unspecified atrial fibrillation: Secondary | ICD-10-CM | POA: Diagnosis not present

## 2024-01-24 DIAGNOSIS — I70229 Atherosclerosis of native arteries of extremities with rest pain, unspecified extremity: Secondary | ICD-10-CM | POA: Diagnosis not present

## 2024-01-24 LAB — CBC
HCT: 21.9 % — ABNORMAL LOW (ref 36.0–46.0)
Hemoglobin: 7.5 g/dL — ABNORMAL LOW (ref 12.0–15.0)
MCH: 31.5 pg (ref 26.0–34.0)
MCHC: 34.2 g/dL (ref 30.0–36.0)
MCV: 92 fL (ref 80.0–100.0)
Platelets: 117 10*3/uL — ABNORMAL LOW (ref 150–400)
RBC: 2.38 MIL/uL — ABNORMAL LOW (ref 3.87–5.11)
RDW: 16.8 % — ABNORMAL HIGH (ref 11.5–15.5)
WBC: 8.7 10*3/uL (ref 4.0–10.5)
nRBC: 0.2 % (ref 0.0–0.2)

## 2024-01-24 LAB — HEMOGLOBIN AND HEMATOCRIT, BLOOD
HCT: 27.2 % — ABNORMAL LOW (ref 36.0–46.0)
Hemoglobin: 9.2 g/dL — ABNORMAL LOW (ref 12.0–15.0)

## 2024-01-24 LAB — RENAL FUNCTION PANEL
Albumin: 2.7 g/dL — ABNORMAL LOW (ref 3.5–5.0)
Anion gap: 3 — ABNORMAL LOW (ref 5–15)
BUN: 16 mg/dL (ref 8–23)
CO2: 25 mmol/L (ref 22–32)
Calcium: 7.8 mg/dL — ABNORMAL LOW (ref 8.9–10.3)
Chloride: 104 mmol/L (ref 98–111)
Creatinine, Ser: 0.74 mg/dL (ref 0.44–1.00)
GFR, Estimated: >60 mL/min (ref >60)
Glucose, Bld: 104 mg/dL — ABNORMAL HIGH (ref 70–99)
Phosphorus: 2.5 mg/dL (ref 2.5–4.6)
Potassium: 4 mmol/L (ref 3.5–5.1)
Sodium: 132 mmol/L — ABNORMAL LOW (ref 135–145)

## 2024-01-24 LAB — PREPARE RBC (CROSSMATCH)

## 2024-01-24 MED ORDER — SODIUM CHLORIDE 0.9% IV SOLUTION: Freq: Once | INTRAVENOUS | Status: AC

## 2024-01-24 MED ORDER — LIDOCAINE 5 % EX PTCH
1.0000 | MEDICATED_PATCH | CUTANEOUS | Status: DC
  Filled 2024-01-24: qty 1

## 2024-01-24 MED ORDER — LIDOCAINE 5 % EX PTCH
1.0000 | MEDICATED_PATCH | CUTANEOUS | Status: DC
  Administered 2024-01-24 – 2024-01-27 (×4): 1 via TRANSDERMAL
  Filled 2024-01-24 (×10): qty 1

## 2024-01-24 NOTE — Progress Notes (Signed)
 3 Days Post-Op   Subjective/Chief Complaint: No events overnight. HgB 7.5 asymptomatic. Complains of stable LEFT leg discomfort/swelling. Right groin pain unchanged   Objective: Vital signs in last 24 hours: Temp:  [97.8 F (36.6 C)-98.6 F (37 C)] 98.6 F (37 C) (04/26 0400) Pulse Rate:  [65-101] 90 (04/26 0800) Resp:  [13-38] 18 (04/26 0800) BP: (102-167)/(50-114) 122/58 (04/26 0800) SpO2:  [96 %-100 %] 99 % (04/26 0800) Last BM Date : (S)  (PTA)  Intake/Output from previous day: 04/25 0701 - 04/26 0700 In: 681.4 [I.V.:281.4; Blood:400] Out: 775 [Urine:775] Intake/Output this shift: No intake/output data recorded.  General appearance: alert and no distress Resp: clear to auscultation bilaterally Cardio: regular rate and rhythm GI: soft, non-tender; bowel sounds normal; no masses,  no organomegaly Extremities: RIGHT- groin with ecchymosis, firm stable hematoma, no bleeding- dressing changed. Leg warm, +DP/PT; LEFT- warm, +edema, soft, +motor/sensory- limited by pain/edema, no compartment syndrome, +DP/PT by doppler  Lab Results:  Recent Labs    01/23/24 0754 01/23/24 1419 01/24/24 0353  WBC 9.3  --  8.7  HGB 6.8* 7.9* 7.5*  HCT 19.9* 23.0* 21.9*  PLT 102*  --  117*   BMET Recent Labs    01/23/24 0754 01/24/24 0353  NA 132* 132*  K 4.0 4.0  CL 106 104  CO2 22 25  GLUCOSE 108* 104*  BUN 18 16  CREATININE 0.74 0.74  CALCIUM  8.0* 7.8*   PT/INR No results for input(s): "LABPROT", "INR" in the last 72 hours. ABG No results for input(s): "PHART", "HCO3" in the last 72 hours.  Invalid input(s): "PCO2", "PO2"  Studies/Results: No results found.  Anti-infectives: Anti-infectives (From admission, onward)    Start     Dose/Rate Route Frequency Ordered Stop   01/20/24 1313  ceFAZolin  (ANCEF ) IVPB 2g/100 mL premix        2 g 200 mL/hr over 30 Minutes Intravenous 30 min pre-op 01/20/24 1313 01/20/24 1550       Assessment/Plan: s/p Procedure(s): Lower  Extremity Angiography (Left)  s/p  left lower extremity angiogram with lysis catheter and multiple angioplasty with stent placement.    Monitor HgB- 7.5 . However, per Cardiology prefer >8 with h/o AFIB/CAD. Will transfuse additional unit this morning. Brilinta / Apixaban  Amiodarone  Bedrest today- OK to sit up. Monitor pulse exam/ extremity exam Encourage PO  LOS: 4 days    Melanie Browning A 01/24/2024

## 2024-01-24 NOTE — Progress Notes (Signed)
 [facility]  - [place] [facility]   PATIENT NAME: Melanie Browning    MR#:  [id number]  DATE OF BIRTH:  Aug 14, 1941  SUBJECTIVE:  seen earlier in the ICU. No family at bedside.Currently complains of weakness and left LE pain Does not like food in the hospital.  VITALS:  Blood pressure (!) 150/72, pulse (!) 34, temperature 98.7 F (37.1 C), temperature source Oral, resp. rate 19, height 5\' 8"  (1.727 m), weight 66 kg, SpO2 100%.  PHYSICAL EXAMINATION:   GENERAL:  83 y.o.-year-old patient with no acute distress.  LUNGS: Normal breath sounds bilaterally, no wheezing CARDIOVASCULAR: S1, S2 normal. No murmur   ABDOMEN: Soft, nontender, nondistended. Bowel sounds present.  EXTREMITIES: left LE edema + NEUROLOGIC: nonfocal  patient is alert and awake   LABORATORY PANEL:  CBC Recent Labs  Lab 01/24/24 0353  WBC 8.7  HGB 7.5*  HCT 21.9*  PLT 117*    Chemistries  Recent Labs  Lab 01/20/24 2226 01/21/24 0730 01/23/24 0754 01/24/24 0353  NA 137   < > 132* 132*  K 3.2*   < > 4.0 4.0  CL 107   < > 106 104  CO2 19*   < > 22 25  GLUCOSE 109*   < > 108* 104*  BUN 21   < > 18 16  CREATININE 0.52   < > 0.74 0.74  CALCIUM  8.7*   < > 8.0* 7.8*  MG 1.5*   < > 1.8  --   AST 34  --   --   --   ALT 23  --   --   --   ALKPHOS 44  --   --   --   BILITOT 1.1  --   --   --    < > = values in this interval not displayed.    Assessment and Plan  83 y.o. 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   New a fib with RVR now rate controlled with STT changes NSTEMI -- history of coronary artery disease with prior stent placement to LAD in 2022 -- cardiology consultation appreciated -- patient is status post left lower extremity vascular procedure -- converted to sinus  rhythm with amiodarone  infusion --  on IV heparin  drip--> ASA +Brilinta  -- outpatient workup for chest pain. Patient currently is chest pain free  Severe peripheral arterial disease lower extremity post procedure groin hematoma Anemia --s/p Left lower extremity angiography third order catheter placement             3.  Percutaneous transluminal angioplasty and stent placement left SFA and popliteal artery.             4.  Percutaneous transluminal angioplasty left posterior tibial artery.             5.  Thrombectomy using the laser left SFA, popliteal and tibioperoneal trunk/posterior tibial             6.  Initiation of thrombolysis -- transfuse as needed --pt getting 1  unit BT today per vascular sx  Electrolyte abnormality -- replace with electrolyte protocol by [facility]  Hyperlipidemia -- patient has declined therapy in the past  Procedures:as above Family communication :none today    TOTAL TIME TAKING CARE OF THIS PATIENT: 35 minutes.  >50% time spent on counselling  and coordination of care  Note: This dictation was prepared with Dragon dictation along with smaller phrase technology. Any transcriptional errors that result from this process are unintentional.  Melvinia Stager M.D    [facility]   CC: Primary care physician; Eartha Gold, MD

## 2024-01-24 NOTE — Plan of Care (Signed)

## 2024-01-24 NOTE — Progress Notes (Signed)
 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 She developed new onset A-fib RVR. Pharmacy is asked to follow and replace electrolytes while in CCU   Goal of Therapy:  Potassium 4.0 - 5.1 mmol/L Magnesium  2.0 - 2.4 mg/dL All Other Electrolytes WNL  Plan:  --No electrolyte replacement indicated at this time --Plan to re-check electrolytes one more time tomorrow AM and if stable will discontinue electrolyte consult and defer further management to primary team  [name] 01/24/2024 7:03 AM

## 2024-01-24 NOTE — Plan of Care (Signed)
   Problem: Activity: Goal: Risk for activity intolerance will decrease Outcome: Progressing   Problem: Nutrition: Goal: Adequate nutrition will be maintained Outcome: Progressing   Problem: Coping: Goal: Level of anxiety will decrease Outcome: Progressing

## 2024-01-24 NOTE — Progress Notes (Signed)
 Rounding Note    Patient Name: Melanie Browning Date of Encounter: 01/24/2024  [facility] Cardiologist: Belva Boyden, MD   Subjective   Patient seen on a.m. rounds.  Continues to complain of left leg pain and swelling.  Continues to deny chest pain or worsening shortness of breath.  No A-fib noted on telemetry overnight.  Hemoglobin 7.5.  Inpatient Medications    Scheduled Meds:  amiodarone   200 mg Oral BID   apixaban   2.5 mg Oral BID   Chlorhexidine  Gluconate Cloth  6 each Topical Daily   levothyroxine   75 mcg Oral QAC breakfast   pantoprazole  (PROTONIX ) IV  40 mg Intravenous QHS   polyethylene glycol  17 g Oral BID   senna-docusate  1 tablet Oral BID   ticagrelor   90 mg Oral BID   Continuous Infusions:  PRN Meds: acetaminophen , ALPRAZolam , diphenoxylate -atropine , hydrALAZINE , hydrALAZINE , HYDROmorphone  (DILAUDID ) injection, labetalol , magnesium  hydroxide, nitroGLYCERIN , ondansetron  (ZOFRAN ) IV, oxyCODONE    Vital Signs    Vitals:   01/24/24 0500 01/24/24 0600 01/24/24 0700 01/24/24 0800  BP: (!) 106/57 (!) 114/59 115/69 (!) 122/58  Pulse: 89 93 90 90  Resp: 17 15 15 18   Temp:      TempSrc:      SpO2: 100% 98% 100% 99%  Weight:      Height:        Intake/Output Summary (Last 24 hours) at 01/24/2024 0940 Last data filed at 01/23/2024 2000 Gross per 24 hour  Intake 531.44 ml  Output 650 ml  Net -118.56 ml      01/20/2024    2:33 PM 12/24/2023    9:40 AM 12/01/2023    2:10 PM  Last 3 Weights  Weight (lbs) 145 lb 6.4 oz 141 lb 1.5 oz 142 lb  Weight (kg) 65.953 kg 64 kg 64.411 kg      Telemetry    Sinus rhythm to sinus tach with rate 90-100- Personally Reviewed  ECG    No new tracings- Personally Reviewed  Physical Exam   GEN: No acute distress.   Neck: No JVD Cardiac: RRR, no murmurs, rubs, or gallops.  Respiratory: Clear to auscultation bilaterally. GI: Soft, nontender, non-distended  MS: Left leg 3+ edema right leg 2+ edema; No  deformity. Neuro:  Nonfocal  Psych: Normal affect   Labs    High Sensitivity Troponin:   Recent Labs  Lab 01/21/24 0123 01/21/24 0510 01/21/24 0734 01/21/24 1145 01/21/24 1328  TROPONINIHS 166* 653* 866* 1,107* 1,018*     Chemistry Recent Labs  Lab 01/20/24 2226 01/21/24 0730 01/22/24 0506 01/23/24 0754 01/24/24 0353  NA 137 133* 131* 132* 132*  K 3.2* 4.6 4.7 4.0 4.0  CL 107 105 103 106 104  CO2 19* 22 22 22 25   GLUCOSE 109* 117* 134* 108* 104*  BUN 21 20 28* 18 16  CREATININE 0.52 0.74 0.98 0.74 0.74  CALCIUM  8.7* 8.2* 7.9* 8.0* 7.8*  MG 1.5* 2.3 2.2 1.8  --   PROT 6.5  --   --   --   --   ALBUMIN 3.5  --  2.8* 2.7* 2.7*  AST 34  --   --   --   --   ALT 23  --   --   --   --   ALKPHOS 44  --   --   --   --   BILITOT 1.1  --   --   --   --   GFRNONAA >60 >60 58* >60 >  60  ANIONGAP 11 6 6  4* 3*    Lipids No results for input(s): "CHOL", "TRIG", "HDL", "LABVLDL", "LDLCALC", "CHOLHDL" in the last 168 hours.  Hematology Recent Labs  Lab 01/22/24 0506 01/22/24 1345 01/23/24 0754 01/23/24 1419 01/24/24 0353  WBC 8.2  --  9.3  --  8.7  RBC 1.77*  --  2.16*  --  2.38*  HGB 5.9*   < > 6.8* 7.9* 7.5*  HCT 18.1*   < > 19.9* 23.0* 21.9*  MCV 102.3*  --  92.1  --  92.0  MCH 33.3  --  31.5  --  31.5  MCHC 32.6  --  34.2  --  34.2  RDW 14.6  --  17.7*  --  16.8*  PLT 121*  --  102*  --  117*   < > = values in this interval not displayed.   Thyroid   Recent Labs  Lab 01/21/24 0730  TSH 2.860  2.758  FREET4 0.98    BNPNo results for input(s): "BNP", "PROBNP" in the last 168 hours.  DDimer No results for input(s): "DDIMER" in the last 168 hours.   Radiology    No results found.  Cardiac Studies   2D echo 01/21/2024 1. Left ventricular ejection fraction, by estimation, is 60 to 65%. The  left ventricle has normal function. The left ventricle has no regional  wall motion abnormalities. Left ventricular diastolic parameters were  normal.   2. Right  ventricular systolic function is normal. The right ventricular  size is normal. There is normal pulmonary artery systolic pressure. The  estimated right ventricular systolic pressure is 23.5 mmHg.   3. The mitral valve is normal in structure. No evidence of mitral valve  regurgitation. No evidence of mitral stenosis.   4. The aortic valve is normal in structure. There is mild calcification  of the aortic valve. Aortic valve regurgitation is not visualized. Aortic  valve sclerosis/calcification is present, without any evidence of aortic  stenosis. Aortic valve mean  gradient measures 12.5 mmHg.   5. The inferior vena cava is normal in size with greater than 50%  respiratory variability, suggesting right atrial pressure of 3 mmHg.    PV procedure April 22   Percutaneous transluminal angioplasty and stent placement left SFA to 6 mm             3.  Percutaneous transluminal angioplasty and stent placement left tibioperoneal trunk and proximal posterior tibial artery with Esprit stents dilated to 3.5 mm distally and 4 mm proximally             4.  Introduction catheter into the venous branch of the failed saphenous vein bypass             5.  Coil embolization of the venous branch of the failed saphenous vein bypass using Ruby coils  Patient Profile     83 y.o. female with past medical history of PVD with critical limb ischemia requiring intervention, claudication, coronary disease status post PCI to the LAD (10/2020), hypertension, hyperlipidemia, hypothyroidism, who has been seen and evaluated for new onset atrial fibrillation with RVR.  Assessment & Plan    NSTEMI -In the setting of atrial fibrillation with RVR, known coronary artery disease with prior stent placement to the LAD in 10/2020 -Post intervention to the left lower extremity on tPA previously -Converted to sinus rhythm on amiodarone  infusion continued on oral amiodarone  -Currently continued on Brilinta  and reduced dose  apixaban  -No immediate plans for  ischemic workup -Recommend outpatient ischemic workup with cardiac PET stress -Currently she remains chest pain-free -High-sensitivity troponins peaked at 1107 -EKG for pain or changes  Atrial fibrillation with RVR -Exacerbated by severe left flank pain, postprocedural/22, noted to be hypokalemic with potassium 3.2 and hypertensive urgency -Converted to sinus rhythm on amiodarone  infusion -Continued on amiodarone  200 mg twice daily -Continued on apixaban  2.5 mg twice daily due to recurrent bleeding and drop in hemoglobin, Brilinta  continued - Continuous telemetry monitoring  Hypotension- improving -Blood pressure 122/58 -In the setting of groin bleed postprocedure -Vital signs per unit protocol  PAD -Severe lower extremity arterial disease -Intervention completed April 22 and April 24 as detailed above -Postprocedure groin hematoma -Bilateral leg pain with left greater than right -Continued on Brilinta  and reduced dose apixaban  -Aspirin  discontinued on 01/23/2024 -Ongoing management per VVS  Hyperlipidemia -Previously declined statin therapy with stopping Lipitor and ezetimibe  -Total cholesterol 323 and LDL 163 -Continues to remain hesitant about starting PCSK9 inhibitor therapy  Anemia -Hemoglobin on 4/24 was 5.9 -Transfused 2 units of PRBCs -Hemoglobin this morning 7.5 -Recommend keeping hemoglobin 8 or better with history of coronary artery disease and atrial fibrillation -Continue to trend with daily CBC     For questions or updates, please contact [facility] Please consult [email or web address] for contact info under        Signed, Beryl Hornberger, NP  01/24/2024, 9:40 AM

## 2024-01-25 DIAGNOSIS — I70229 Atherosclerosis of native arteries of extremities with rest pain, unspecified extremity: Secondary | ICD-10-CM | POA: Diagnosis not present

## 2024-01-25 DIAGNOSIS — D500 Iron deficiency anemia secondary to blood loss (chronic): Secondary | ICD-10-CM | POA: Diagnosis not present

## 2024-01-25 DIAGNOSIS — Z95828 Presence of other vascular implants and grafts: Secondary | ICD-10-CM

## 2024-01-25 DIAGNOSIS — Z9889 Other specified postprocedural states: Secondary | ICD-10-CM

## 2024-01-25 DIAGNOSIS — I4891 Unspecified atrial fibrillation: Secondary | ICD-10-CM | POA: Diagnosis not present

## 2024-01-25 DIAGNOSIS — M79605 Pain in left leg: Secondary | ICD-10-CM

## 2024-01-25 LAB — BPAM RBC
Blood Product Expiration Date: 202505202359
Blood Product Expiration Date: 202505202359
Blood Product Expiration Date: 202505212359
Blood Product Expiration Date: 202505302359
ISSUE DATE / TIME: 202504240952
ISSUE DATE / TIME: 202504241511
ISSUE DATE / TIME: 202504250937
ISSUE DATE / TIME: 202504261105
Unit Type and Rh: 6200
Unit Type and Rh: 6200
Unit Type and Rh: 6200
Unit Type and Rh: 6200

## 2024-01-25 LAB — BASIC METABOLIC PANEL WITH GFR
Anion gap: 7 (ref 5–15)
BUN: 15 mg/dL (ref 8–23)
CO2: 23 mmol/L (ref 22–32)
Calcium: 8 mg/dL — ABNORMAL LOW (ref 8.9–10.3)
Chloride: 100 mmol/L (ref 98–111)
Creatinine, Ser: 0.59 mg/dL (ref 0.44–1.00)
GFR, Estimated: >60 mL/min (ref >60)
Glucose, Bld: 97 mg/dL (ref 70–99)
Potassium: 3.7 mmol/L (ref 3.5–5.1)
Sodium: 130 mmol/L — ABNORMAL LOW (ref 135–145)

## 2024-01-25 LAB — TYPE AND SCREEN
ABO/RH(D): A POS
Antibody Screen: NEGATIVE
Unit division: 0
Unit division: 0
Unit division: 0
Unit division: 0

## 2024-01-25 LAB — CBC
HCT: 24.3 % — ABNORMAL LOW (ref 36.0–46.0)
Hemoglobin: 8.4 g/dL — ABNORMAL LOW (ref 12.0–15.0)
MCH: 30.9 pg (ref 26.0–34.0)
MCHC: 34.6 g/dL (ref 30.0–36.0)
MCV: 89.3 fL (ref 80.0–100.0)
Platelets: 129 10*3/uL — ABNORMAL LOW (ref 150–400)
RBC: 2.72 MIL/uL — ABNORMAL LOW (ref 3.87–5.11)
RDW: 16.7 % — ABNORMAL HIGH (ref 11.5–15.5)
WBC: 11 10*3/uL — ABNORMAL HIGH (ref 4.0–10.5)
nRBC: 0 % (ref 0.0–0.2)

## 2024-01-25 LAB — MAGNESIUM: Magnesium: 1.8 mg/dL (ref 1.7–2.4)

## 2024-01-25 LAB — PHOSPHORUS: Phosphorus: 2.6 mg/dL (ref 2.5–4.6)

## 2024-01-25 MED ORDER — POTASSIUM CHLORIDE CRYS ER 20 MEQ PO TBCR
40.0000 meq | EXTENDED_RELEASE_TABLET | Freq: Once | ORAL | Status: AC
  Administered 2024-01-25: 40 meq via ORAL
  Filled 2024-01-25: qty 2

## 2024-01-25 MED ORDER — PANTOPRAZOLE SODIUM 40 MG PO TBEC
40.0000 mg | DELAYED_RELEASE_TABLET | Freq: Every day | ORAL | Status: DC
  Administered 2024-01-25 – 2024-02-01 (×8): 40 mg via ORAL
  Filled 2024-01-25 (×8): qty 1

## 2024-01-25 MED ORDER — MAGNESIUM SULFATE 2 GM/50ML IV SOLN
2.0000 g | Freq: Once | INTRAVENOUS | Status: AC
  Administered 2024-01-25: 2 g via INTRAVENOUS
  Filled 2024-01-25: qty 50

## 2024-01-25 NOTE — Progress Notes (Signed)
 Rounding Note    Patient Name: Melanie Browning Date of Encounter: 01/25/2024  [facility] Cardiologist: Belva Boyden, MD   Subjective   Patient seen lying on rounds.  Denies any chest pain or shortness of breath.  Hemoglobin up to 8.5.  Continues to complain of left lower extremity swelling and discomfort that is slightly improved.  No overnight events were recorded.  Inpatient Medications    Scheduled Meds:  amiodarone   200 mg Oral BID   apixaban   2.5 mg Oral BID   Chlorhexidine  Gluconate Cloth  6 each Topical Daily   levothyroxine   75 mcg Oral QAC breakfast   lidocaine   1 patch Transdermal Q24H   pantoprazole  (PROTONIX ) IV  40 mg Intravenous QHS   polyethylene glycol  17 g Oral BID   potassium chloride   40 mEq Oral Once   senna-docusate  1 tablet Oral BID   ticagrelor   90 mg Oral BID   Continuous Infusions:  magnesium  sulfate bolus IVPB     PRN Meds: acetaminophen , ALPRAZolam , diphenoxylate -atropine , HYDROmorphone  (DILAUDID ) injection, labetalol , magnesium  hydroxide, nitroGLYCERIN , ondansetron  (ZOFRAN ) IV, oxyCODONE    Vital Signs    Vitals:   01/24/24 2300 01/25/24 0010 01/25/24 0100 01/25/24 0400  BP:  (!) 152/86  107/89  Pulse: 90 89 85 69  Resp: 15 19 13 14   Temp:  98.6 F (37 C)  98.6 F (37 C)  TempSrc:  Oral  Oral  SpO2: 99% 96% 97% 91%  Weight:      Height:        Intake/Output Summary (Last 24 hours) at 01/25/2024 0758 Last data filed at 01/25/2024 0400 Gross per 24 hour  Intake 611 ml  Output 375 ml  Net 236 ml      01/20/2024    2:33 PM 12/24/2023    9:40 AM 12/01/2023    2:10 PM  Last 3 Weights  Weight (lbs) 145 lb 6.4 oz 141 lb 1.5 oz 142 lb  Weight (kg) 65.953 kg 64 kg 64.411 kg      Telemetry    Sinus rhythm with rates in the 80s- Personally Reviewed  ECG    No new tracings- Personally Reviewed  Physical Exam   GEN: No acute distress.   Neck: No JVD Cardiac: RRR, I/VI systolic murmur RUSB, without rubs or gallops.   Respiratory: Clear with diminished bases to auscultation bilaterally.  Respirations are unlabored at rest on room air GI: Soft, nontender, non-distended  MS: 2+ left leg and 1+ right edema; No deformity. Neuro:  Nonfocal  Psych: Normal affect   Labs    High Sensitivity Troponin:   Recent Labs  Lab 01/21/24 0123 01/21/24 0510 01/21/24 0734 01/21/24 1145 01/21/24 1328  TROPONINIHS 166* 653* 866* 1,107* 1,018*     Chemistry Recent Labs  Lab 01/20/24 2226 01/21/24 0730 01/22/24 0506 01/23/24 0754 01/24/24 0353 01/25/24 0327  NA 137   < > 131* 132* 132* 130*  K 3.2*   < > 4.7 4.0 4.0 3.7  CL 107   < > 103 106 104 100  CO2 19*   < > 22 22 25 23   GLUCOSE 109*   < > 134* 108* 104* 97  BUN 21   < > 28* 18 16 15   CREATININE 0.52   < > 0.98 0.74 0.74 0.59  CALCIUM  8.7*   < > 7.9* 8.0* 7.8* 8.0*  MG 1.5*   < > 2.2 1.8  --  1.8  PROT 6.5  --   --   --   --   --  ALBUMIN 3.5  --  2.8* 2.7* 2.7*  --   AST 34  --   --   --   --   --   ALT 23  --   --   --   --   --   ALKPHOS 44  --   --   --   --   --   BILITOT 1.1  --   --   --   --   --   GFRNONAA >60   < > 58* >60 >60 >60  ANIONGAP 11   < > 6 4* 3* 7   < > = values in this interval not displayed.    Lipids No results for input(s): "CHOL", "TRIG", "HDL", "LABVLDL", "LDLCALC", "CHOLHDL" in the last 168 hours.  Hematology Recent Labs  Lab 01/23/24 0754 01/23/24 1419 01/24/24 0353 01/24/24 1542 01/25/24 0327  WBC 9.3  --  8.7  --  11.0*  RBC 2.16*  --  2.38*  --  2.72*  HGB 6.8*   < > 7.5* 9.2* 8.4*  HCT 19.9*   < > 21.9* 27.2* 24.3*  MCV 92.1  --  92.0  --  89.3  MCH 31.5  --  31.5  --  30.9  MCHC 34.2  --  34.2  --  34.6  RDW 17.7*  --  16.8*  --  16.7*  PLT 102*  --  117*  --  129*   < > = values in this interval not displayed.   Thyroid   Recent Labs  Lab 01/21/24 0730  TSH 2.860  2.758  FREET4 0.98    BNPNo results for input(s): "BNP", "PROBNP" in the last 168 hours.  DDimer No results for input(s):  "DDIMER" in the last 168 hours.   Radiology    No results found.  Cardiac Studies   2D echo 01/21/2024 1. Left ventricular ejection fraction, by estimation, is 60 to 65%. The  left ventricle has normal function. The left ventricle has no regional  wall motion abnormalities. Left ventricular diastolic parameters were  normal.   2. Right ventricular systolic function is normal. The right ventricular  size is normal. There is normal pulmonary artery systolic pressure. The  estimated right ventricular systolic pressure is 23.5 mmHg.   3. The mitral valve is normal in structure. No evidence of mitral valve  regurgitation. No evidence of mitral stenosis.   4. The aortic valve is normal in structure. There is mild calcification  of the aortic valve. Aortic valve regurgitation is not visualized. Aortic  valve sclerosis/calcification is present, without any evidence of aortic  stenosis. Aortic valve mean  gradient measures 12.5 mmHg.   5. The inferior vena cava is normal in size with greater than 50%  respiratory variability, suggesting right atrial pressure of 3 mmHg.    PV procedure April 22   Percutaneous transluminal angioplasty and stent placement left SFA to 6 mm             3.  Percutaneous transluminal angioplasty and stent placement left tibioperoneal trunk and proximal posterior tibial artery with Esprit stents dilated to 3.5 mm distally and 4 mm proximally             4.  Introduction catheter into the venous branch of the failed saphenous vein bypass             5.  Coil embolization of the venous branch of the failed saphenous vein bypass using Ruby coils  Patient Profile  83 y.o. 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  Assessment & Plan     NSTEMI -In the setting of atrial fibrillation with RVR and known coronary artery disease with prior stent placement to the LAD in 10/2020 -Postintervention to the left lower extremity previously on tPA - Converted to sinus rhythm on amiodarone  infusion -Continued on Brilinta  reduced dose apixaban , aspirin  discontinued -No immediate plans for continued ischemic workup -Currently she remains chest pain-free -High-sensitivity troponin peaked at 1107 -Echocardiogram revealed an LVEF of 60 to 65%, no RWMA, RV SF normal size and function, aortic valve sclerosis without evidence of stenosis. -EKG for pain or changes -Consider outpatient ischemic workup with cardiac PET stress  Atrial fibrillation with RVR -Exacerbated by severe leg pain, postprocedural, hypokalemia and hypertensive urgency -Converted to sinus rhythm on amiodarone  infusion -Continued on amiodarone  200 mg twice daily -Continued on apixaban  2.5 mg twice daily due to recurrent bleeding and drop in hemoglobin and Brilinta  also continue -Continue with telemetry monitoring  PAD -Severe lower extremity arterial disease -Intervention completed in January 20, 2019 fourth as detailed above -Postprocedure groin hematoma -Required blood transfusions for symptomatic anemia -Continued on Brilinta  and reduced dose apixaban  -Aspirin  discontinued/25/25 -Ongoing management per VVS  Hyperlipidemia -Total cholesterol 323 and LDL 163 -Declined statin therapy and is hesitant about PCSK9 inhibitor therapy  Anemia -Hemoglobin 4/24 5.9 - Transfused 3 units PRBCs thus far - Hemoglobin this a.m. 8.4 - Recommend keeping hemoglobin 8 or better with history of coronary disease and atrial fibrillation - Continue to trend daily CBC For questions or updates, please contact [facility] Please consult [email or web address] for contact info under        Signed, Iram Lundberg, NP  01/25/2024, 7:58 AM

## 2024-01-25 NOTE — Progress Notes (Signed)
 4 Days Post-Op   Subjective/Chief Complaint: Transfused 1 U pRBC yesterday HgB 7.5- 8.4; States she feels better, LEFT leg pain persists but is also improved. Difficulty with movement secondary to swelling and pain. Still limited PO intake.   Objective: Vital signs in last 24 hours: Temp:  [98.6 F (37 C)-99 F (37.2 C)] 98.6 F (37 C) (04/27 0400) Pulse Rate:  [34-104] 92 (04/27 0900) Resp:  [10-30] 28 (04/27 0900) BP: (107-163)/(54-94) 110/75 (04/27 0800) SpO2:  [91 %-100 %] 99 % (04/27 0900) Last BM Date : (S)  (PTA)  Intake/Output from previous day: 04/26 0701 - 04/27 0700 In: 611 [Blood:611] Out: 375 [Urine:375] Intake/Output this shift: Total I/O In: -  Out: 50 [Urine:50]   General appearance: alert and no distress Resp: clear to auscultation bilaterally Cardio: regular rate and rhythm GI: soft, non-tender; bowel sounds normal; no masses,  no organomegaly  Extremities-RIGHT- groin with ecchymosis, soft, improved hematoma, no bleeding- Leg warm, +DP/PT; LEFT- warm,  less edema, softer than prior exam, +motor/sensory- limited by pain/edema, no compartment syndrome, +DP/PT by doppler   Lab Results:  Recent Labs    01/24/24 0353 01/24/24 1542 01/25/24 0327  WBC 8.7  --  11.0*  HGB 7.5* 9.2* 8.4*  HCT 21.9* 27.2* 24.3*  PLT 117*  --  129*   BMET Recent Labs    01/24/24 0353 01/25/24 0327  NA 132* 130*  K 4.0 3.7  CL 104 100  CO2 25 23  GLUCOSE 104* 97  BUN 16 15  CREATININE 0.74 0.59  CALCIUM  7.8* 8.0*   PT/INR No results for input(s): "LABPROT", "INR" in the last 72 hours. ABG No results for input(s): "PHART", "HCO3" in the last 72 hours.  Invalid input(s): "PCO2", "PO2"  Studies/Results: No results found.  Anti-infectives: Anti-infectives (From admission, onward)    Start     Dose/Rate Route Frequency Ordered Stop   01/20/24 1313  ceFAZolin  (ANCEF ) IVPB 2g/100 mL premix        2 g 200 mL/hr over 30 Minutes Intravenous 30 min pre-op  01/20/24 1313 01/20/24 1550       Assessment/Plan: s/p Procedure(s): Lower Extremity Angiography (Left) s/p left lower extremity angiogram with lysis catheter and multiple angioplasty with stent placement.   Monitor HgB Brilinta /Apixaban  Amiodarone  OK for OOB to chair with PT Monitor exam Encourage PO  LOS: 5 days    Karel Osler A 01/25/2024

## 2024-01-25 NOTE — Progress Notes (Signed)
 PHARMACY CONSULT NOTE  Pharmacy Consult for Electrolyte Monitoring and Replacement   Recent Labs: Potassium (mmol/L)  Date Value  01/25/2024 3.7   Magnesium  (mg/dL)  Date Value  53/66/4403 1.8   Calcium  (mg/dL)  Date Value  47/42/5956 8.0 (L)   Albumin (g/dL)  Date Value  38/75/6433 2.7 (L)  06/17/2023 4.5   Phosphorus (mg/dL)  Date Value  29/51/8841 2.6   Sodium (mmol/L)  Date Value  01/25/2024 130 (L)   Assessment: 83 yo F presenting to [facility] for scheduled vascular surgery due to 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 She developed new onset A-fib RVR. Pharmacy is asked to follow and replace electrolytes while in CCU   Goal of Therapy:  Potassium 4.0 - 5.1 mmol/L Magnesium  2.0 - 2.4 mg/dL All Other Electrolytes WNL  Plan:  --K 3.7, Kcl 40 mEq PO x 1 dose --Mg 1.8, magnesium  sulfate 2 g IV x 1 --Patient care transferred from PCCM to TRH. Will discontinue electrolyte consult at this time  [name] 01/25/2024 7:01 AM

## 2024-01-25 NOTE — NC FL2 (Signed)
 [place]  MEDICAID FL2 LEVEL OF CARE FORM     IDENTIFICATION  Patient Name: Melanie Browning Birthdate: [birth year] Sex: female Admission Date (Current Location): 01/20/2024  [facility] [place] and [place] Number:  Chiropodist and Address:  [facility], [address] [place], [place] [address]      Provider Number: [id number]  Attending Physician Name and Address:  Jackquelyn Mass, MD  Relative Name and Phone Number:  Viera, [facility])  [phone]    Current Level of Care: Hospital Recommended Level of Care: Skilled Nursing Facility Prior Approval Number:    Date Approved/Denied:   PASRR Number: [id number]  Discharge Plan: SNF    Current Diagnoses: 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   Muscle spasms of neck 04/06/2022   Cervical spondylosis 04/06/2022   Acute pain of left wrist 02/14/2022   Atherosclerosis of native arteries of extremity with rest pain (HCC) 11/20/2021   CAD (coronary artery disease) 12/10/2020   Coronary artery disease involving native coronary artery of native heart 11/30/2020   S/P drug eluting coronary stent placement 11/15/2020   Abdominal pain, LLQ (left lower quadrant) 09/28/2020   Long term current use of anticoagulant therapy 09/28/2020   Functional diarrhea 09/28/2020   Chronic venous insufficiency 04/23/2020   PAD (peripheral artery disease) (HCC) 04/19/2020   Hyperlipidemia 04/19/2020   Hypothyroidism 04/19/2020   Popliteal artery injury, left, 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   Disruption of external operation  (surgical) wound, not elsewhere classified, initial encounter 11/09/2019   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) 10/29/2019   Stricture, artery (HCC) 10/29/2019   Critical lower limb ischemia (HCC) 10/13/2019   Hypothyroidism due to acquired atrophy of thyroid  04/07/2019   Syncope and collapse 04/07/2019   Senile osteoporosis 09/20/2016   Sleep disorder 07/18/2016   IGT (impaired glucose tolerance) 06/26/2015   Pap smear abnormality of cervix with ASCUS favoring dysplasia 06/26/2015   Family history of Guillain-Barre syndrome 04/26/2015   History of herpes genitalis 04/26/2015   History of HPV infection 04/26/2015   Dyslipidemia 04/17/2015   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   PMR (polymyalgia rheumatica) (HCC) 04/17/2015   Arthritis of shoulder region, 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    Orientation RESPIRATION BLADDER Height & Weight     Self, Time, Situation, Place  Normal Continent Weight: 66 kg Height:  5\' 8"  (172.7 cm)  BEHAVIORAL SYMPTOMS/MOOD NEUROLOGICAL BOWEL NUTRITION STATUS      Continent Diet (heart healthy)  AMBULATORY STATUS COMMUNICATION OF NEEDS Skin   Extensive Assist Verbally Surgical wounds                       Personal Care Assistance Level of Assistance  Bathing, Dressing, Feeding Bathing Assistance: Limited assistance Feeding assistance: Independent Dressing Assistance: Limited assistance     Functional Limitations Info             SPECIAL CARE FACTORS FREQUENCY  PT (By licensed PT), OT (By licensed OT)     PT Frequency: 5 times a week OT Frequency: 5 times a week  Contractures Contractures Info: Not present    Additional Factors Info  Code Status, Allergies Code Status Info: DNR Allergies Info: Lactose           Current Medications (01/25/2024):  This is the current hospital active medication list Current Facility-Administered Medications   Medication Dose Route Frequency Provider Last Rate Last Admin   acetaminophen  (TYLENOL ) tablet 650 mg  650 mg Oral Q4H PRN Schnier, Gregory G, MD       ALPRAZolam  (XANAX ) tablet 1 mg  1 mg Oral BID PRN Schnier, Gregory G, MD       amiodarone  (PACERONE ) tablet 200 mg  200 mg Oral BID Gollan, Timothy J, MD   200 mg at 01/25/24 0930   apixaban  (ELIQUIS ) tablet 2.5 mg  2.5 mg Oral BID Pace, Brien R, NP   2.5 mg at 01/25/24 0930   Chlorhexidine  Gluconate Cloth 2 % PADS 6 each  6 each Topical Daily Schnier, Ninette Basque, MD   6 each at 01/25/24 1610   diphenoxylate -atropine  (LOMOTIL ) 2.5-0.025 MG per tablet 1 tablet  1 tablet Oral QID PRN Schnier, Gregory G, MD   1 tablet at 01/24/24 1306   HYDROmorphone  (DILAUDID ) injection 0.5-1 mg  0.5-1 mg Intravenous Q2H PRN Schnier, Gregory G, MD   1 mg at 01/21/24 2234   labetalol  (NORMODYNE ) injection 10 mg  10 mg Intravenous Q10 min PRN Schnier, Ninette Basque, MD       levothyroxine  (SYNTHROID ) tablet 75 mcg  75 mcg Oral QAC breakfast Schnier, Gregory G, MD   75 mcg at 01/25/24 0612   lidocaine  (LIDODERM ) 5 % 1 patch  1 patch Transdermal Q24H Esco, Miechia A, MD   1 patch at 01/25/24 1338   magnesium  hydroxide (MILK OF MAGNESIA) suspension 30 mL  30 mL Oral Daily PRN Schnier, Gregory G, MD       nitroGLYCERIN  (NITROSTAT ) SL tablet 0.4 mg  0.4 mg Sublingual Q5 min PRN Schnier, Ninette Basque, MD       ondansetron  (ZOFRAN ) injection 4 mg  4 mg Intravenous Q6H PRN Schnier, Gregory G, MD   4 mg at 01/21/24 0642   oxyCODONE  (Oxy IR/ROXICODONE ) immediate release tablet 5-10 mg  5-10 mg Oral Q4H PRN Schnier, Gregory G, MD   10 mg at 01/25/24 1157   pantoprazole  (PROTONIX ) EC tablet 40 mg  40 mg Oral QHS Chappell, Alex B, RPH       polyethylene glycol (MIRALAX  / GLYCOLAX ) packet 17 g  17 g Oral BID Patel, Sona, MD   8 g at 01/25/24 9604   senna-docusate (Senokot-S) tablet 1 tablet  1 tablet Oral BID Patel, Sona, MD   1 tablet at 01/24/24 2135   ticagrelor  (BRILINTA ) tablet 90 mg   90 mg Oral BID Pace, Brien R, NP   90 mg at 01/25/24 0930     Discharge Medications: Please see discharge summary for a list of discharge medications.  Relevant Imaging Results:  Relevant Lab Results:   Additional Information [id number] = SSN  Holland Lundborg, RN

## 2024-01-25 NOTE — TOC Initial Note (Signed)
 Transition of Care [facility]) - Initial/Assessment Note    Patient Details  Name: Melanie Browning MRN: [id number] Date of Birth: 1941-09-26  [facility] of Care [facility]) CM/SW Contact:    Holland Lundborg, RN Phone Number: 01/25/2024, 2:20 PM  Clinical Narrative:                  Spoke with patient at bedside, report she is from [facility] ILF with her husband, Dr. Harwood Lingo is PCP.  She is aware that recommendations are for discharge to short term rehab before returning to ILF, she agrees and will go to [facility] when medically stable.  Will complete FL2. TOC will continue to follow.   Expected Discharge Plan: Skilled Nursing Facility Barriers to Discharge: Continued Medical Work up   Patient Goals and CMS Choice Patient states their goals for this hospitalization and ongoing recovery are:: Back to [facility] ILF after rehab   Choice offered to / list presented to : Patient      Expected Discharge Plan and Services     Post Acute Care Choice: Skilled Nursing Facility Living arrangements for the past 2 months: Independent Living Facility                                      Prior Living Arrangements/Services Living arrangements for the past 2 months: Independent Living Facility Lives with:: Spouse          Need for Family Participation in Patient Care: Yes (Comment) Care giver support system in place?: Yes (comment)   Criminal Activity/Legal Involvement Pertinent to Current Situation/Hospitalization: No - Comment as needed  Activities of Daily Living   ADL Screening (condition at time of admission) Independently performs ADLs?: Yes (appropriate for developmental age) Is the patient deaf or have difficulty hearing?: No Does the patient have difficulty seeing, even when wearing glasses/contacts?: No Does the patient have difficulty concentrating, remembering, or making decisions?: No  Permission Sought/Granted                  Emotional Assessment Appearance:: Appears  stated age Attitude/Demeanor/Rapport: Engaged Affect (typically observed): Calm Orientation: : Oriented to Self, Oriented to Place, Oriented to  Time, Oriented to Situation   Psych Involvement: No (comment)  Admission diagnosis:  Atherosclerosis of artery of extremity with rest pain (HCC) [I70.229] 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   Muscle spasms of neck 04/06/2022   Cervical spondylosis 04/06/2022   Acute pain of left wrist 02/14/2022   Atherosclerosis of native arteries of extremity with rest pain (HCC) 11/20/2021   CAD (coronary artery disease) 12/10/2020   Coronary artery disease involving native coronary artery of native heart 11/30/2020   S/P drug eluting coronary stent placement 11/15/2020   Abdominal pain, LLQ (left lower quadrant) 09/28/2020   Long term current use of anticoagulant therapy 09/28/2020   Functional diarrhea 09/28/2020   Chronic venous insufficiency 04/23/2020   PAD (peripheral artery disease) (HCC) 04/19/2020   Hyperlipidemia 04/19/2020   Hypothyroidism 04/19/2020   Popliteal artery injury, left, 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   Vaginal dysplasia  03/24/2020   Disruption of external operation (surgical) wound, not elsewhere classified, initial encounter 11/09/2019   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) 10/29/2019   Stricture, artery (HCC) 10/29/2019   Critical lower limb ischemia (HCC) 10/13/2019   Hypothyroidism due to acquired atrophy of thyroid  04/07/2019   Syncope and collapse 04/07/2019   Senile osteoporosis 09/20/2016   Sleep  disorder 07/18/2016   IGT (impaired glucose tolerance) 06/26/2015   Pap smear abnormality of cervix with ASCUS favoring dysplasia 06/26/2015   Family history of Guillain-Barre syndrome 04/26/2015   History of herpes genitalis 04/26/2015   History of HPV infection 04/26/2015   Dyslipidemia 04/17/2015   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 [place] [address] Phone: [phone] Fax: [phone]     Social Drivers of Health (SDOH) Social History: SDOH Screenings   Food Insecurity: No Food Insecurity (01/21/2024)  Housing: Low Risk  (01/23/2024)  Transportation Needs: No Transportation Needs (01/23/2024)  Utilities: Not At Risk (01/21/2024)  Depression (PHQ2-9): Low Risk  (01/03/2023)  Financial Resource Strain: Low Risk  (11/06/2023)   Received from [facility]  Social Connections: Socially Integrated (01/21/2024)  Tobacco Use: Low Risk  (01/20/2024)   SDOH Interventions:     Readmission Risk Interventions     No data to display

## 2024-01-25 NOTE — Progress Notes (Signed)
 [facility]  - [place] at [facility]   PATIENT NAME: Melanie Browning    MR#:  [id number]  DATE OF BIRTH:  1941/09/14  SUBJECTIVE:  seen earlier in the ICU. No family at bedside.Currently complains of weakness and left LE pain Does not like food in the hospital!  VITALS:  Blood pressure (!) 140/93, pulse 88, temperature 98.8 F (37.1 C), temperature source Oral, resp. rate (!) 21, height 5\' 8"  (1.727 m), weight 66 kg, SpO2 90%.  PHYSICAL EXAMINATION:   GENERAL:  83 y.o.-year-old patient with no acute distress.  LUNGS: Normal breath sounds bilaterally, no wheezing CARDIOVASCULAR: S1, S2 normal. No murmur   ABDOMEN: Soft, nontender, nondistended. Bowel sounds present.  EXTREMITIES: left LE edema + NEUROLOGIC: nonfocal  patient is alert and awake   LABORATORY PANEL:  CBC Recent Labs  Lab 01/25/24 0327  WBC 11.0*  HGB 8.4*  HCT 24.3*  PLT 129*    Chemistries  Recent Labs  Lab 01/20/24 2226 01/21/24 0730 01/25/24 0327  NA 137   < > 130*  K 3.2*   < > 3.7  CL 107   < > 100  CO2 19*   < > 23  GLUCOSE 109*   < > 97  BUN 21   < > 15  CREATININE 0.52   < > 0.59  CALCIUM  8.7*   < > 8.0*  MG 1.5*   < > 1.8  AST 34  --   --   ALT 23  --   --   ALKPHOS 44  --   --   BILITOT 1.1  --   --    < > = values in this interval not displayed.    Assessment and Plan  83 y.o. 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   New a fib with RVR now rate controlled with STT changes NSTEMI -- history of coronary artery disease with prior stent placement to LAD in 2022 -- cardiology consultation appreciated -- patient is status post left lower extremity vascular procedure -- converted to sinus rhythm with amiodarone  infusion --  on IV heparin  drip--> ASA +Brilinta  --  outpatient workup for chest pain. Patient currently is chest pain free  Severe peripheral arterial disease lower extremity post procedure groin hematoma Anemia --s/p Left lower extremity angiography third order catheter placement             3.  Percutaneous transluminal angioplasty and stent placement left SFA and popliteal artery.             4.  Percutaneous transluminal angioplasty left posterior tibial artery.             5.  Thrombectomy using the laser left SFA, popliteal and tibioperoneal trunk/posterior tibial             6.  Initiation of thrombolysis -- transfuse as needed --pt s/p 4 unit BT  per vascular sx--hgb 8.4  Electrolyte abnormality -- replace with electrolyte protocol by [facility]  Hyperlipidemia -- patient has declined therapy in the past  Procedures:as above Family communication :none today    TOTAL TIME TAKING CARE OF THIS PATIENT: 35 minutes.  >50% time spent on counselling and coordination of care  Note: This dictation was prepared with Dragon dictation along with smaller phrase technology. Any transcriptional errors that result from this  process are unintentional.  Melvinia Stager M.D    [facility]   CC: Primary care physician; Eartha Gold, MD

## 2024-01-26 ENCOUNTER — Inpatient Hospital Stay

## 2024-01-26 DIAGNOSIS — I70222 Atherosclerosis of native arteries of extremities with rest pain, left leg: Secondary | ICD-10-CM | POA: Diagnosis not present

## 2024-01-26 DIAGNOSIS — T82898A Other specified complication of vascular prosthetic devices, implants and grafts, initial encounter: Secondary | ICD-10-CM | POA: Diagnosis not present

## 2024-01-26 DIAGNOSIS — Z7901 Long term (current) use of anticoagulants: Secondary | ICD-10-CM | POA: Diagnosis not present

## 2024-01-26 DIAGNOSIS — I480 Paroxysmal atrial fibrillation: Secondary | ICD-10-CM | POA: Diagnosis not present

## 2024-01-26 DIAGNOSIS — I4891 Unspecified atrial fibrillation: Secondary | ICD-10-CM | POA: Diagnosis not present

## 2024-01-26 DIAGNOSIS — I70229 Atherosclerosis of native arteries of extremities with rest pain, unspecified extremity: Secondary | ICD-10-CM | POA: Diagnosis not present

## 2024-01-26 LAB — COMPREHENSIVE METABOLIC PANEL WITH GFR
ALT: 15 U/L (ref 0–44)
AST: 27 U/L (ref 15–41)
Albumin: 2.6 g/dL — ABNORMAL LOW (ref 3.5–5.0)
Alkaline Phosphatase: 40 U/L (ref 38–126)
Anion gap: 6 (ref 5–15)
BUN: 18 mg/dL (ref 8–23)
CO2: 22 mmol/L (ref 22–32)
Calcium: 7.9 mg/dL — ABNORMAL LOW (ref 8.9–10.3)
Chloride: 99 mmol/L (ref 98–111)
Creatinine, Ser: 0.59 mg/dL (ref 0.44–1.00)
GFR, Estimated: >60 mL/min (ref >60)
Glucose, Bld: 90 mg/dL (ref 70–99)
Potassium: 4 mmol/L (ref 3.5–5.1)
Sodium: 127 mmol/L — ABNORMAL LOW (ref 135–145)
Total Bilirubin: 1.9 mg/dL — ABNORMAL HIGH (ref 0.0–1.2)
Total Protein: 5.2 g/dL — ABNORMAL LOW (ref 6.5–8.1)

## 2024-01-26 LAB — CBC
HCT: 23 % — ABNORMAL LOW (ref 36.0–46.0)
Hemoglobin: 7.8 g/dL — ABNORMAL LOW (ref 12.0–15.0)
MCH: 30.6 pg (ref 26.0–34.0)
MCHC: 33.9 g/dL (ref 30.0–36.0)
MCV: 90.2 fL (ref 80.0–100.0)
Platelets: 172 10*3/uL (ref 150–400)
RBC: 2.55 MIL/uL — ABNORMAL LOW (ref 3.87–5.11)
RDW: 16.4 % — ABNORMAL HIGH (ref 11.5–15.5)
WBC: 9.8 10*3/uL (ref 4.0–10.5)
nRBC: 0.3 % — ABNORMAL HIGH (ref 0.0–0.2)

## 2024-01-26 LAB — OSMOLALITY: Osmolality: 272 mosm/kg — ABNORMAL LOW (ref 275–295)

## 2024-01-26 LAB — OSMOLALITY, URINE: Osmolality, Ur: 588 mosm/kg (ref 300–900)

## 2024-01-26 LAB — SODIUM, URINE, RANDOM: Sodium, Ur: 34 mmol/L

## 2024-01-26 LAB — TSH: TSH: 8.093 u[IU]/mL — ABNORMAL HIGH (ref 0.350–4.500)

## 2024-01-26 MED ORDER — IOHEXOL 350 MG/ML SOLN
100.0000 mL | Freq: Once | INTRAVENOUS | Status: AC | PRN
  Administered 2024-01-26: 100 mL via INTRAVENOUS

## 2024-01-26 MED ORDER — FLEET ENEMA RE ENEM
1.0000 | ENEMA | Freq: Once | RECTAL | Status: AC
  Administered 2024-01-27: 1 via RECTAL

## 2024-01-26 MED ORDER — CHLORHEXIDINE GLUCONATE CLOTH 2 % EX PADS
6.0000 | MEDICATED_PAD | Freq: Every day | CUTANEOUS | Status: DC
  Administered 2024-01-27 – 2024-02-01 (×6): 6 via TOPICAL

## 2024-01-26 MED ORDER — FLEET ENEMA RE ENEM: 1.0000 | ENEMA | Freq: Once | RECTAL | Status: DC | PRN

## 2024-01-26 MED ORDER — POLYETHYLENE GLYCOL 3350 17 G PO PACK
34.0000 g | PACK | Freq: Once | ORAL | Status: AC
  Administered 2024-01-27: 34 g via ORAL
  Filled 2024-01-26: qty 2

## 2024-01-26 MED ORDER — DICLOFENAC SODIUM 1 % EX GEL
2.0000 g | Freq: Four times a day (QID) | CUTANEOUS | Status: DC
  Administered 2024-01-26 – 2024-01-28 (×6): 2 g via TOPICAL
  Filled 2024-01-26: qty 100

## 2024-01-26 NOTE — Progress Notes (Addendum)
 [facility]  - [place] at [facility]   PATIENT NAME: Melanie Browning    MR#:  [id number]  DATE OF BIRTH:  [birth year]  SUBJECTIVE:  Reports feeling ok, tolerating diet, no chest pain, does have leg pain b/l  VITALS:  Blood pressure (!) 169/78, pulse 90, temperature 98 F (36.7 C), resp. rate 18, height 5\' 8"  (1.727 m), weight 66 kg, SpO2 100%.  PHYSICAL EXAMINATION:   GENERAL:  83 y.o.-year-old patient with no acute distress.  LUNGS: Normal breath sounds bilaterally, no wheezing CARDIOVASCULAR: S1, S2 normal. No murmur   ABDOMEN: Soft, nontender, nondistended. Bowel sounds present.  EXTREMITIES: left LE edema +, bruising on arms, depending bruising upper thighs and perineum NEUROLOGIC: nonfocal  patient is alert and awake   LABORATORY PANEL:  CBC Recent Labs  Lab 01/26/24 0518  WBC 9.8  HGB 7.8*  HCT 23.0*  PLT 172    Chemistries  Recent Labs  Lab 01/25/24 0327 01/26/24 0518  NA 130* 127*  K 3.7 4.0  CL 100 99  CO2 23 22  GLUCOSE 97 90  BUN 15 18  CREATININE 0.59 0.59  CALCIUM  8.0* 7.9*  MG 1.8  --   AST  --  27  ALT  --  15  ALKPHOS  --  40  BILITOT  --  1.9*    Assessment and Plan  83 y.o. 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   New a fib with RVR now rate controlled with STT changes NSTEMI -- history of coronary artery disease with prior stent placement to LAD in 2022 -- cardiology consultation appreciated -- patient is status post left lower extremity vascular procedure -- converted to sinus rhythm with amiodarone  infusion --  on IV heparin  drip--> apixaban , brilinta  -- outpatient workup for chest pain. Patient currently is chest pain free  Severe peripheral arterial disease lower extremity post procedure groin  hematoma Anemia --s/p Left lower extremity angiography third order catheter placement             3.  Percutaneous transluminal angioplasty and stent placement left SFA and popliteal artery.             4.  Percutaneous transluminal angioplasty left posterior tibial artery.             5.  Thrombectomy using the laser left SFA, popliteal and tibioperoneal trunk/posterior tibial             6.  Initiation of thrombolysis -- transfuse as needed --pt s/p 4 unit BT  per vascular sx--hgb 7.8 today, holding on additional unit -- CTA ordered today to eval for active bleeding  Electrolyte abnormality -- replace with electrolyte protocol by [facility]  Hyponatremia Sodium has drifted to 127. Euvolemic, suspect siadh - lab w/u ordered  Hyperlipidemia -- patient has declined therapy in the past\  Urinary retention New today. Apparently most recent bladder scan shows 900. Per nursing vascular has advised holding on cath due to prior difficulty with that, have instead consulted urology. CT today pending  Hypothyroid Home synthroid   Procedures:as above Family communication :none today    Raymonde Calico M.D    [facility]

## 2024-01-26 NOTE — Consult Note (Signed)
 I have been asked to see the patient by Dr. [name], for evaluation and management of urinary retention.  History of present illness: 24F with PVD and resting lower extremity pain who presented to the hospital for lower extremity angiogram and stent.  This was complicated by NSTEMI and post-procedure bleeding.  She was noted to have a puncture site hematoma.  Once her foley catheter was removed following her procedure she was unable to void.  Given her swelling and difficult anatomy, foley catheter placement by nursing staff was cnosidered risky/difficult and urology was consulted.  The patient is not complaining of any pain.  She does have some fullness in her pelvis.  She has significant amount of edema within the groin and thighs.  The patient relates no difficulty voiding previous to this.  Review of systems: A 12 point comprehensive review of systems was obtained and is negative unless otherwise stated in the history of present illness.  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   Muscle spasms of neck 04/06/2022   Cervical spondylosis 04/06/2022   Acute pain of left wrist 02/14/2022   Atherosclerosis of native arteries of extremity with rest pain (HCC) 11/20/2021   CAD (coronary artery disease) 12/10/2020   Coronary artery disease involving native coronary artery of native heart 11/30/2020   S/P drug eluting coronary stent placement 11/15/2020   Abdominal pain, LLQ (left lower quadrant) 09/28/2020   Long term current use of anticoagulant therapy 09/28/2020   Functional diarrhea 09/28/2020   Chronic  venous insufficiency 04/23/2020   PAD (peripheral artery disease) (HCC) 04/19/2020   Hyperlipidemia 04/19/2020   Hypothyroidism 04/19/2020   Popliteal artery injury, left, 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   Disruption of external operation (surgical) wound, not elsewhere classified, initial encounter 11/09/2019   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) 10/29/2019   Stricture, artery (HCC) 10/29/2019   Critical lower limb ischemia (HCC) 10/13/2019   Hypothyroidism due to acquired atrophy of thyroid  04/07/2019   Syncope and collapse 04/07/2019   Senile osteoporosis 09/20/2016   Sleep disorder 07/18/2016   IGT (impaired glucose tolerance) 06/26/2015   Pap smear abnormality of cervix with ASCUS favoring dysplasia 06/26/2015   Family history of Guillain-Barre syndrome 04/26/2015   History of herpes genitalis 04/26/2015   History of HPV infection 04/26/2015   Dyslipidemia 04/17/2015   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   Current Outpatient Medications on File Prior to Encounter  Medication Sig Dispense Refill   aspirin  EC 81 MG tablet Take 81 mg by mouth every evening. Swallow whole.     B Complex Vitamins (VITAMIN B-COMPLEX) TABS Take 1 tablet by mouth daily.     budesonide  (ENTOCORT EC ) 3 MG 24 hr capsule Take 3 capsules (9 mg total) by mouth every morning. 270 capsule 2   diphenoxylate -atropine  (LOMOTIL ) 2.5-0.025 MG tablet TAKE 1 TABLET BY MOUTH 3 TIMES DAILY AS NEEDED FOR DIARRHEA. 90 tablet  0   levothyroxine  (SYNTHROID ) 75 MCG tablet Take 75 mcg by mouth daily before breakfast.      losartan  (COZAAR ) 25 MG tablet Take 25 mg by mouth daily.     rosuvastatin (CRESTOR) 5 MG tablet Take 5 mg by mouth daily.      ticagrelor  (BRILINTA ) 90 MG TABS tablet TAKE ONE TABLET BY MOUTH TWICE DAILY 180 tablet 0   estradiol  (ESTRACE ) 0.1 MG/GM vaginal cream Insert 1/4 applicator full into vagina at night twice a week 42.5 g 2   HYDROcodone -acetaminophen  (NORCO) 10-325 MG tablet Take 1 tablet by mouth every 6 (six) hours as needed for severe pain (pain score 7-10). 40 tablet 0    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)    a.) multiple vascular interventions (PTA, stenting, arthrectomy, and thrombectomy) of LEFT SFA, LEFT popliteal, LEFT posterior tibial, and LEFT anterior tibial arteries due to critical limb ischemia   CAD (coronary artery disease) 11/09/2020   a.) cCTA 11/09/2020: Ca2+ 539 (83rd %'ile for age/sex/race match control); <25% dRCA & pLCx, >70% pLAD; b.) LHC/PCI 11/15/2020: 90% pLAD (2.75 x 18 mm Resolute Onyx DES); c.) MV 06/26/2023: no sig ischemia   Cervical spondylosis    Colitis    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)    a.) s/p multiple vascular interventions   Diastolic dysfunction 06/26/2023   a.) TTE 06/26/2023: EF 60-65%, no RWMAs, G2DD, sev AoV thickening with mild AR, mild-mod AS (MPG 18.2 mmHg; AVA 0.73 cm2)   [place]   Dyslipidemia    History of cervical cancer    a.) s/p TAH   HPV (human papilloma virus) infection    Hyperlipidemia    Hypothyroidism    IGT (impaired glucose tolerance)    Left upper lobe pulmonary nodule    Long term current use of aspirin     Long term current use of ticagrelor  therapy    Macrocytic anemia    Osteoporosis    a.) Tx'd with zolodronic acid (Reclast) infusions   PAD (peripheral artery disease) (HCC)    Polymyalgia rheumatica (HCC)    Skin cancer of nose     Transaminitis    Vaginal dysplasia     Past Surgical History:  Procedure Laterality Date   COLONOSCOPY WITH PROPOFOL  N/A 10/04/2020   Procedure: COLONOSCOPY WITH PROPOFOL ;  Surgeon: Toledo, Alphonsus Jeans, MD;  Location: [facility] ENDOSCOPY;  Service: Gastroenterology;  Laterality: N/A;   COLPOSCOPY     CORONARY STENT INTERVENTION N/A 11/15/2020   Procedure: CORONARY STENT INTERVENTION;  Surgeon: Antonette Batters, MD;  Location: [facility] INVASIVE CV LAB;  Service: Cardiovascular;  Laterality: N/A;   FEMORAL-POPLITEAL BYPASS GRAFT Left 07/16/2023   Procedure: BYPASS GRAFT FEMORAL-POPLITEAL ARTERY (FEMORAL-POSTERIOR TIBIAL BYPASS W/ SAPHENOUS VEIN- [id number]);  Surgeon: Jackquelyn Mass, MD;  Location: [facility] ORS;  Service: Vascular;  Laterality: Left;   KNEE ARTHROSCOPY Left    LAPAROSCOPIC HYSTERECTOMY     LEFT HEART CATH AND CORONARY ANGIOGRAPHY N/A 11/15/2020   Procedure: LEFT HEART CATH AND CORONARY ANGIOGRAPHY;  Surgeon: Antonette Batters, MD;  Location: [facility] INVASIVE CV LAB;  Service: Cardiovascular;  Laterality: N/A;   LOWER EXTREMITY ANGIOGRAPHY Left 09/05/2020   Procedure: LOWER EXTREMITY ANGIOGRAPHY;  Surgeon: Jackquelyn Mass, MD;  Location: [facility] INVASIVE CV LAB;  Service: Cardiovascular;  Laterality: Left;   LOWER EXTREMITY ANGIOGRAPHY Left 04/10/2021   Procedure: LOWER EXTREMITY ANGIOGRAPHY;  Surgeon: Jackquelyn Mass, MD;  Location: [facility] INVASIVE CV LAB;  Service: Cardiovascular;  Laterality: Left;   LOWER EXTREMITY ANGIOGRAPHY Left 11/20/2021   Procedure: Lower Extremity Angiography;  Surgeon: Jackquelyn Mass, MD;  Location: [facility] INVASIVE CV LAB;  Service: Cardiovascular;  Laterality: Left;   LOWER EXTREMITY ANGIOGRAPHY Left 06/05/2022   Procedure: Lower Extremity Angiography;  Surgeon: Jackquelyn Mass, MD;  Location: [facility] INVASIVE CV LAB;  Service: Cardiovascular;  Laterality: Left;   LOWER EXTREMITY ANGIOGRAPHY Left 07/23/2022   Procedure: Lower Extremity Angiography;  Surgeon:  Jackquelyn Mass, MD;  Location: [facility] INVASIVE CV LAB;  Service: Cardiovascular;  Laterality: Left;   LOWER EXTREMITY ANGIOGRAPHY Left 12/27/2022   Procedure: Lower Extremity Angiography;  Surgeon: Jackquelyn Mass, MD;  Location: [facility] INVASIVE CV LAB;  Service: Cardiovascular;  Laterality: Left;   LOWER EXTREMITY ANGIOGRAPHY Left 06/17/2023   Procedure: Lower Extremity Angiography;  Surgeon: Jackquelyn Mass, MD;  Location: [facility] INVASIVE CV LAB;  Service: Cardiovascular;  Laterality: Left;   LOWER EXTREMITY ANGIOGRAPHY Left 01/20/2024   Procedure: Lower Extremity Angiography;  Surgeon: Jackquelyn Mass, MD;  Location: [facility] INVASIVE CV LAB;  Service: Cardiovascular;  Laterality: Left;   LOWER EXTREMITY ANGIOGRAPHY Left 01/21/2024   Procedure: Lower Extremity Angiography;  Surgeon: Jackquelyn Mass, MD;  Location: [facility] INVASIVE CV LAB;  Service: Cardiovascular;  Laterality: Left;   LOWER EXTREMITY INTERVENTION Left 01/20/2024   Procedure: LOWER EXTREMITY INTERVENTION;  Surgeon: Jackquelyn Mass, MD;  Location: [facility] INVASIVE CV LAB;  Service: Cardiovascular;  Laterality: Left;   TOTAL ABDOMINAL HYSTERECTOMY      Social History   Tobacco Use   Smoking status: Never   Smokeless tobacco: Never  Vaping Use   Vaping status: Never Used  Substance Use Topics   Alcohol  use: Yes    Alcohol /week: 1.0 standard drink of alcohol     Types: 1 Glasses of wine per week    Comment: 5   Drug use: Never    Family History  Problem Relation Age of Onset   Cancer Mother    Pancreatic cancer Mother    Heart failure Mother    Heart attack Father 40   Heart disease Father     PE: Vitals:   01/26/24 0014 01/26/24 0515 01/26/24 0818 01/26/24 1132  BP: (!) 119/51 132/65 131/72 (!) 169/78  Pulse: 98 90 85 90  Resp: 18  18 18   Temp: 99.6 F (37.6 C) 98 F (36.7 C)    TempSrc:      SpO2: 98% 97% 100% 100%  Weight:      Height:       Patient appears to be in no acute distress  patient is  alert and oriented x3 Atraumatic normocephalic head No cervical or supraclavicular lymphadenopathy appreciated No increased work of breathing, no audible wheezes/rhonchi Regular sinus rhythm/rate Abdomen is soft, nontender, significant suprapubic distention but no tenderness Significant groin and thigh edema and bruising Grossly neurologically intact No identifiable skin lesions  Recent Labs    01/24/24 0353 01/24/24 1542 01/25/24 0327 01/26/24 0518  WBC 8.7  --  11.0* 9.8  HGB 7.5* 9.2* 8.4* 7.8*  HCT 21.9* 27.2* 24.3* 23.0*   Recent Labs    01/24/24 0353 01/25/24 0327 01/26/24 0518  NA 132* 130* 127*  K 4.0 3.7 4.0  CL 104 100 99  CO2 25 23 22   GLUCOSE 104* 97 90  BUN 16 15 18   CREATININE 0.74 0.59 0.59  CALCIUM  7.8* 8.0* 7.9*   No results for input(s): "LABPT", "INR" in the last 72 hours. No results for input(s): "LABURIN" in the last 72 hours. Results for orders placed or performed during the hospital encounter of 07/07/23  Surgical pcr screen     Status: None   Collection Time: 07/07/23 10:02 AM   Specimen: Nasal Mucosa; Nasal Swab  Result Value Ref Range Status   MRSA, PCR NEGATIVE NEGATIVE Final   Staphylococcus aureus NEGATIVE NEGATIVE Final    Comment: (NOTE) The Xpert SA Assay (FDA approved for NASAL specimens in patients 63 years of age and older), is one component of a comprehensive surveillance program. It is not intended to diagnose infection nor to guide or monitor treatment. Performed at [facility], [address] [place], [place] [address]     Imaging: I reviewed the patient's CT scan that was performed just prior to my arrival.  This demonstrated a severely distended bladder.  She also had a significant amount of stool in the rectum.  I did not appreciate any pelvic hematomas.  Imp: Urinary retention of unclear etiology.  Foley catheter was placed atraumatically and 1300 cc was returned.  Recommendations: Recommend the catheter  be left for at least 7 days prior to attempting a voiding trial.  I can schedule her for follow-up as an outpatient in the urology clinic.  If the patient still here in 7 days then the catheter can be removed in the hospital.  Thank you for involving me in this patient's care, please page with any further questions or concerns. Andrez Banker

## 2024-01-26 NOTE — Progress Notes (Signed)
 Physical Therapy Treatment Patient Details Name: Melanie Browning MRN: [id number] DOB: 10/11/1940 Today's Date: 01/26/2024   History of Present Illness 83 y/o female s/p L LE angiography with thrombectomy and stent placement on 01/20/24. PMH: PVD with critical limb ischemia, CAD s/p PCI to LAD 10/2020, HTN    PT Comments  Patient agreeable to PT treatment session, despite pain in BLEs. Required minA for bed mobility. Encouragement provided for OOB mobility. Required modA to stand from slightly elevated bed surface. Able to take small steps towards BSC with RW and minA for RW management. Patient reporting lightheadedness and fatigue seated on BSC with BP 103/60. Assisted patient back to bed via squat pivot with maxA. Discharge plan remains appropriate.     If plan is discharge home, recommend the following: A little help with walking and/or transfers;A little help with bathing/dressing/bathroom;Assistance with cooking/housework;Assist for transportation;Help with stairs or ramp for entrance   Can travel by private vehicle     Yes  Equipment Recommendations  Rolling Ilze Roselli (2 wheels);BSC/3in1    Recommendations for Other Services       Precautions / Restrictions Precautions Precautions: Fall Recall of Precautions/Restrictions: Intact Restrictions Weight Bearing Restrictions Per Provider Order: No     Mobility  Bed Mobility Overal bed mobility: Needs Assistance Bed Mobility: Supine to Sit, Sit to Supine     Supine to sit: Min assist Sit to supine: Min assist        Transfers Overall transfer level: Needs assistance Equipment used: Rolling Denora Wysocki (2 wheels) Transfers: Sit to/from Stand, Bed to chair/wheelchair/BSC Sit to Stand: Mod assist   Step pivot transfers: Min assist Squat pivot transfers: Max assist     General transfer comment: modA to stand from EOB with patient pulling up on RW. MinA for RW management during step pivot transfer. Patient complaining of  lightheadedness and fatigue sitting on BSC. BP 103/60. Assisted patient back to bed with maxA via squat pivot    Ambulation/Gait                   Stairs             Wheelchair Mobility     Tilt Bed    Modified Rankin (Stroke Patients Only)       Balance Overall balance assessment: Needs assistance Sitting-balance support: No upper extremity supported, Feet supported Sitting balance-Leahy Scale: Fair     Standing balance support: Bilateral upper extremity supported, Reliant on assistive device for balance Standing balance-Leahy Scale: Poor                              Communication Communication Communication: No apparent difficulties  Cognition Arousal: Alert Behavior During Therapy: WFL for tasks assessed/performed, Anxious   PT - Cognitive impairments: No apparent impairments                         Following commands: Intact      Cueing    Exercises      General Comments        Pertinent Vitals/Pain Pain Assessment Pain Assessment: Faces Faces Pain Scale: Hurts even more Pain Location: LLE Pain Descriptors / Indicators: Grimacing, Guarding, Discomfort Pain Intervention(s): Limited activity within patient's tolerance, Monitored during session, Repositioned    Home Living                          Prior  Function            PT Goals (current goals can now be found in the care plan section) Acute Rehab PT Goals Patient Stated Goal: to get stronger PT Goal Formulation: With patient Time For Goal Achievement: 02/06/24 Potential to Achieve Goals: Good Progress towards PT goals: Progressing toward goals    Frequency    Min 3X/week      PT Plan      Co-evaluation              AM-PAC PT "6 Clicks" Mobility   Outcome Measure  Help needed turning from your back to your side while in a flat bed without using bedrails?: A Little Help needed moving from lying on your back to sitting on the  side of a flat bed without using bedrails?: A Little Help needed moving to and from a bed to a chair (including a wheelchair)?: A Little Help needed standing up from a chair using your arms (e.g., wheelchair or bedside chair)?: A Little Help needed to walk in hospital room?: A Lot Help needed climbing 3-5 steps with a railing? : A Lot 6 Click Score: 16    End of Session   Activity Tolerance: Patient tolerated treatment well Patient left: in bed;with bed alarm set;with call bell/phone within reach Nurse Communication: Mobility status PT Visit Diagnosis: Unsteadiness on feet (R26.81);Muscle weakness (generalized) (M62.81);Other abnormalities of gait and mobility (R26.89)     Time: 0981-1914 PT Time Calculation (min) (ACUTE ONLY): 41 min  Charges:    $Therapeutic Activity: 38-52 mins PT General Charges $$ ACUTE PT VISIT: 1 Visit                     Janine Melbourne, PT, DPT Physical Therapist - [facility]  [facility]    Mickel Schreur A Debraann Livingstone 01/26/2024, 2:34 PM

## 2024-01-26 NOTE — Plan of Care (Signed)

## 2024-01-26 NOTE — TOC Progression Note (Signed)
 Transition of Care [facility]) - Progression Note    Patient Details  Name: Melanie Browning MRN: [id number] Date of Birth: [birth year]  [facility] CM/SW Contact  Elsie Halo, RN Phone Number: 01/26/2024, 4:22 PM  Clinical Narrative:    Stella Edward spoke with Cain Castillo at [facility] to inform that therapy is recommending SNF. Per Cain Castillo they will have a bed when the patient is medically ready.  TOC will continue to follow.    Expected Discharge Plan: Skilled Nursing Facility Barriers to Discharge: Continued Medical Work up  Expected Discharge Plan and Services     Post Acute Care Choice: Skilled Nursing Facility Living arrangements for the past 2 months: Independent Living Facility                                       Social Determinants of Health (SDOH) Interventions SDOH Screenings   Food Insecurity: No Food Insecurity (01/21/2024)  Housing: Low Risk  (01/23/2024)  Transportation Needs: No Transportation Needs (01/23/2024)  Utilities: Not At Risk (01/21/2024)  Depression (PHQ2-9): Low Risk  (01/03/2023)  Financial Resource Strain: Low Risk  (11/06/2023)   Received from [facility]  Social Connections: Socially Integrated (01/21/2024)  Tobacco Use: Low Risk  (01/20/2024)    Readmission Risk Interventions     No data to display

## 2024-01-26 NOTE — Care Management Important Message (Signed)
 Important Message  Patient Details  Name: Melanie Browning MRN: [id number] Date of Birth: 04/04/1941   Important Message Given:  Yes - Medicare IM     Abanoub Hanken W, CMA 01/26/2024, 11:32 AM

## 2024-01-26 NOTE — Progress Notes (Signed)
   01/26/24 0915  Spiritual Encounters  Type of Visit Initial  Care provided to: Patient  Reason for visit Advance directives  OnCall Visit No   Chaplain visited with patient because she had questions about an AD.  Patient is signed up to so Anatomical Gift Donation with [facility] and questioned if an AD would interfere with the program she's a part of.  Chaplain contacted colleague and left message to get reply on how to handle the AD in relation to the Anatomical Gift donation. Chaplain shared with patient that she would return once she has information to share.    Rev. Rana M. Nolon Baxter, M.Div.  Chaplain Resident [facility]

## 2024-01-26 NOTE — Progress Notes (Signed)
 I spoke with Dr. Salli Crawley from urology at 4:23 PM via chat this afternoon.  After checking Ms. Calzadilla her last bladder scan was 900 mL and she was unable to urinate.  We could not get a Foley catheter in her.  I asked that he come see her as soon as he is able to.  He responded he would come to see her.

## 2024-01-26 NOTE — Progress Notes (Signed)
 Progress Note    01/26/2024 9:47 AM 5 Days Post-Op  Subjective:  Melanie Browning is an 83 year old female now status postop day 1 from left lower extremity angiogram and lysis catheter with transluminal angioplasty and stent placement to the left SFA, transluminal angioplasty and stent placement to the left tibioperoneal trunk and proximal posterior tibial artery and coil embolization of the venous branch of the failed saphenous vein bypass using Ruby coils.   On exam this morning patient resting comfortably in bed.  She had her Foley catheter removed over the weekend.  She is continue to have some urinary retention.  This morning's bladder scan was 291 mL.  Endorses they tried putting a Foley catheter and multiple times without success.  I told her I would consult urology for some help with the issue.  She also endorses that she is having difficulty moving or lifting her left leg as it feels heavy.  There is noticeable +1 to +2 edema with reperfusion syndrome to her leg that received the lysis and now the stenting.   Patient noted to have converted to atrial fibrillation with RVR during procedure yesterday.  Cardiology was consulted.  They are okay with current anticoagulation.  Patient was initially placed on amiodarone  infusion.  This was changed to amiodarone  200 mg twice daily.  Patient is currently in normal sinus rhythm.   Review of Systems  Constitutional:  Constitutional negative. Respiratory: Respiratory negative.  Cardiovascular: Cardiovascular negative.  GI: Gastrointestinal negative.  GU: Genitourinary negative. Musculoskeletal: Musculoskeletal negative.  Skin: Skin negative.  Neurological: Neurological negative. Psychiatric: Psychiatric negative.  All other systems reviewed and are negative   Vitals:   01/26/24 0515 01/26/24 0818  BP: 132/65 131/72  Pulse: 90 85  Resp:  18  Temp: 98 F (36.7 C)   SpO2: 97% 100%   Physical Exam: Cardiac:  RRR, normal S1 and S2.  No  rubs clicks gallops or murmurs noted this morning. Lungs: [name] on auscultation throughout but diminished in the bases.  No rales rhonchi or wheezing noted.  Nonlabored breathing. Incisions: Right groin incision clean dry and intact.  Erythema noted to incision site. Extremities: Bilateral lower extremities warm to touch both with positive Doppler DP and PT pulses that are strong. Abdomen: Positive bowel sounds throughout, soft, nontender and nondistended.  Slight bladder distention on exam. Neurologic: Alert and oriented x 3, answers all questions and follows commands appropriately.  CBC    Component Value Date/Time   WBC 9.8 01/26/2024 0518   RBC 2.55 (L) 01/26/2024 0518   HGB 7.8 (L) 01/26/2024 0518   HGB 13.2 06/17/2023 0931   HCT 23.0 (L) 01/26/2024 0518   HCT 39.8 06/17/2023 0931   PLT 172 01/26/2024 0518   PLT 181 06/17/2023 0931   MCV 90.2 01/26/2024 0518   MCV 99 (H) 06/17/2023 0931   MCH 30.6 01/26/2024 0518   MCHC 33.9 01/26/2024 0518   RDW 16.4 (H) 01/26/2024 0518   RDW 13.1 06/17/2023 0931   LYMPHSABS 0.8 01/03/2023 1142   MONOABS 0.5 01/03/2023 1142   EOSABS 0.1 01/03/2023 1142   BASOSABS 0.0 01/03/2023 1142    BMET    Component Value Date/Time   NA 127 (L) 01/26/2024 0518   K 4.0 01/26/2024 0518   CL 99 01/26/2024 0518   CO2 22 01/26/2024 0518   GLUCOSE 90 01/26/2024 0518   BUN 18 01/26/2024 0518   CREATININE 0.59 01/26/2024 0518   CALCIUM  7.9 (L) 01/26/2024 0518   GFRNONAA >60 01/26/2024 0518  INR No results found for: "INR"  No intake or output data in the 24 hours ending 01/26/24 0947   Assessment/Plan:  83 y.o. female is s/p left lower extremity angiogram with lysis catheter and multiple angioplasty with stent placement. Also noted to have placed Ruby coils for failed saphenous vein bypass graft.  5 Days Post-Op   PLAN Continue ASA 81 mg p.o. daily, Brilinta  90 mg p.o. daily, and Eliquis  2.5 mg twice daily Urology consult Ordered CTA abdomen  pelvis with extension into left lower extremity to assess for bleeding. Advance diet as tolerated. Pain medication as needed CBC and BMP in the morning.  DVT prophylaxis: ASA 80 mg daily and Eliquis  5 mg daily.   Annamaria Barrette [facility] 01/26/2024 9:47 AM

## 2024-01-26 NOTE — Progress Notes (Signed)
   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  Chaplain checked with colleague to see if the patient can do this and she confirmed she could.  Chaplain shared information with patient that she would list all of this in the special instructions section of the AD.  Rev. Rana M. Nolon Baxter, M.Div.  Chaplain Resident  [facility]

## 2024-01-26 NOTE — Procedures (Signed)
 Pre-procedure diagnosis: difficult foley catheterization Post-procedure diagnosis: as above  Procedure performed: placement of complicated foley  Surgeon: Dr. Andrez Banker  Findings: vaginal introital stenosis, 1300cc of urine in bladder Specimen: urine culture  Drains: 16 foley  Indications:  Patient unable to void on his own.  Nursing staff have been unsuccessful at placing catheter.  Procedure:  Gentials were prepped and draped in the routine sterile fashion.  10cc of 1% viscous lidocaine  jelly was then injected into the patient's urethra.  Proper exposure was obtained using help for the NT - Madeline.   A 18F catheter was then gently passed in to the urethra.  Clear yellow urine was returned.  Patient tolerated the procedure well - no immediate issues.  Disposition: Start flomax, repeat voiding trial in 5-7 days.

## 2024-01-26 NOTE — Progress Notes (Signed)
 Per shift report, patient unable to void. Had trace void for the whole shift. Bladder scan done at 6am showed . Dr. Prescilla Brod notified.  Per Dr Prescilla Brod, he will consult urology for retention since she was a very hard foley insertion.

## 2024-01-26 NOTE — Progress Notes (Signed)
 Rounding Note    Patient Name: Melanie Browning Date of Encounter: 01/26/2024  [facility] Cardiologist: Belva Boyden, MD   Subjective   She denies chest pain or shortness of breath.  She is maintaining in sinus rhythm.  Hemoglobin decreased to 7.8 from 8.4.  She complains of bilateral leg pain worse on the left side.  She still has significant swelling especially in the left lower extremity  Inpatient Medications    Scheduled Meds:  amiodarone   200 mg Oral BID   apixaban   2.5 mg Oral BID   levothyroxine   75 mcg Oral QAC breakfast   lidocaine   1 patch Transdermal Q24H   pantoprazole   40 mg Oral QHS   polyethylene glycol  17 g Oral BID   senna-docusate  1 tablet Oral BID   ticagrelor   90 mg Oral BID   Continuous Infusions:   PRN Meds: acetaminophen , ALPRAZolam , diphenoxylate -atropine , HYDROmorphone  (DILAUDID ) injection, labetalol , magnesium  hydroxide, nitroGLYCERIN , ondansetron  (ZOFRAN ) IV, oxyCODONE    Vital Signs    Vitals:   01/26/24 0014 01/26/24 0515 01/26/24 0818 01/26/24 1132  BP: (!) 119/51 132/65 131/72 (!) 169/78  Pulse: 98 90 85 90  Resp: 18  18 18   Temp: 99.6 F (37.6 C) 98 F (36.7 C)    TempSrc:      SpO2: 98% 97% 100% 100%  Weight:      Height:       No intake or output data in the 24 hours ending 01/26/24 1247     01/20/2024    2:33 PM 12/24/2023    9:40 AM 12/01/2023    2:10 PM  Last 3 Weights  Weight (lbs) 145 lb 6.4 oz 141 lb 1.5 oz 142 lb  Weight (kg) 65.953 kg 64 kg 64.411 kg      Telemetry    Sinus rhythm with rates in the 80s- Personally Reviewed  ECG    No new tracings- Personally Reviewed  Physical Exam   GEN: No acute distress.   Neck: No JVD Cardiac: RRR, I/VI systolic murmur RUSB, without rubs or gallops.  Respiratory: Clear with diminished bases to auscultation bilaterally.  Respirations are unlabored at rest on room air GI: Soft, nontender, non-distended  MS: 2+ left leg and 1+ right edema; No  deformity. Neuro:  Nonfocal  Psych: Normal affect   Labs    High Sensitivity Troponin:   Recent Labs  Lab 01/21/24 0123 01/21/24 0510 01/21/24 0734 01/21/24 1145 01/21/24 1328  TROPONINIHS 166* 653* 866* 1,107* 1,018*     Chemistry Recent Labs  Lab 01/20/24 2226 01/21/24 0730 01/22/24 0506 01/23/24 0754 01/24/24 0353 01/25/24 0327 01/26/24 0518  NA 137   < > 131* 132* 132* 130* 127*  K 3.2*   < > 4.7 4.0 4.0 3.7 4.0  CL 107   < > 103 106 104 100 99  CO2 19*   < > 22 22 25 23 22   GLUCOSE 109*   < > 134* 108* 104* 97 90  BUN 21   < > 28* 18 16 15 18   CREATININE 0.52   < > 0.98 0.74 0.74 0.59 0.59  CALCIUM  8.7*   < > 7.9* 8.0* 7.8* 8.0* 7.9*  MG 1.5*   < > 2.2 1.8  --  1.8  --   PROT 6.5  --   --   --   --   --  5.2*  ALBUMIN 3.5  --  2.8* 2.7* 2.7*  --  2.6*  AST 34  --   --   --   --   --  27  ALT 23  --   --   --   --   --  15  ALKPHOS 44  --   --   --   --   --  40  BILITOT 1.1  --   --   --   --   --  1.9*  GFRNONAA >60   < > 58* >60 >60 >60 >60  ANIONGAP 11   < > 6 4* 3* 7 6   < > = values in this interval not displayed.    Lipids No results for input(s): "CHOL", "TRIG", "HDL", "LABVLDL", "LDLCALC", "CHOLHDL" in the last 168 hours.  Hematology Recent Labs  Lab 01/24/24 0353 01/24/24 1542 01/25/24 0327 01/26/24 0518  WBC 8.7  --  11.0* 9.8  RBC 2.38*  --  2.72* 2.55*  HGB 7.5* 9.2* 8.4* 7.8*  HCT 21.9* 27.2* 24.3* 23.0*  MCV 92.0  --  89.3 90.2  MCH 31.5  --  30.9 30.6  MCHC 34.2  --  34.6 33.9  RDW 16.8*  --  16.7* 16.4*  PLT 117*  --  129* 172   Thyroid   Recent Labs  Lab 01/21/24 0730  TSH 2.860  2.758  FREET4 0.98    BNPNo results for input(s): "BNP", "PROBNP" in the last 168 hours.  DDimer No results for input(s): "DDIMER" in the last 168 hours.   Radiology    No results found.  Cardiac Studies   2D echo 01/21/2024 1. Left ventricular ejection fraction, by estimation, is 60 to 65%. The  left ventricle has normal function. The left  ventricle has no regional  wall motion abnormalities. Left ventricular diastolic parameters were  normal.   2. Right ventricular systolic function is normal. The right ventricular  size is normal. There is normal pulmonary artery systolic pressure. The  estimated right ventricular systolic pressure is 23.5 mmHg.   3. The mitral valve is normal in structure. No evidence of mitral valve  regurgitation. No evidence of mitral stenosis.   4. The aortic valve is normal in structure. There is mild calcification  of the aortic valve. Aortic valve regurgitation is not visualized. Aortic  valve sclerosis/calcification is present, without any evidence of aortic  stenosis. Aortic valve mean  gradient measures 12.5 mmHg.   5. The inferior vena cava is normal in size with greater than 50%  respiratory variability, suggesting right atrial pressure of 3 mmHg.    PV procedure April 22   Percutaneous transluminal angioplasty and stent placement left SFA to 6 mm             3.  Percutaneous transluminal angioplasty and stent placement left tibioperoneal trunk and proximal posterior tibial artery with Esprit stents dilated to 3.5 mm distally and 4 mm proximally             4.  Introduction catheter into the venous branch of the failed saphenous vein bypass             5.  Coil embolization of the venous branch of the failed saphenous vein bypass using Ruby coils  Patient Profile     83 y.o. 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  Assessment & Plan    NSTEMI vs supply demand ischemia -In the setting of atrial fibrillation with RVR and  known coronary artery disease with prior stent placement to the LAD in 10/2020 -Postintervention to the left lower extremity previously on  tPA - Converted to sinus rhythm on amiodarone  infusion -Continued on Brilinta  reduced dose apixaban , aspirin  discontinued -No immediate plans for continued ischemic workup -High-sensitivity troponin peaked at 1107 -Echocardiogram revealed an LVEF of 60 to 65%, no RWMA, RV SF normal size and function, aortic valve sclerosis without evidence of stenosis. -Consider outpatient ischemic workup with cardiac PET stress. Aspirin  was discontinued to decrease risk of bleeding given that she is on ticagrelor  and Eliquis .  Atrial fibrillation with RVR -Exacerbated by severe leg pain, postprocedural, hypokalemia and hypertensive urgency -Converted to sinus rhythm on amiodarone  infusion -Continued on amiodarone  200 mg twice daily -Continued on apixaban  2.5 mg twice daily due to recurrent bleeding and drop in hemoglobin and the need for antiplatelet therapy. -Continue with telemetry monitoring  PAD -Severe lower extremity arterial disease -Intervention completed in January 20, 2019 fourth as detailed above -Postprocedure groin hematoma -Required blood transfusions for symptomatic anemia -Continued on Brilinta  and reduced dose apixaban  -Aspirin  discontinued/25/25 -Ongoing management per VVS  Hyperlipidemia -Total cholesterol 323 and LDL 163 -Declined statin therapy and is hesitant about PCSK9 inhibitor therapy    For questions or updates, please contact [facility] Please consult [email or web address] for contact info under        Signed, Antionette Kirks, MD  01/26/2024, 12:47 PM

## 2024-01-27 DIAGNOSIS — R7989 Other specified abnormal findings of blood chemistry: Secondary | ICD-10-CM | POA: Diagnosis not present

## 2024-01-27 DIAGNOSIS — I480 Paroxysmal atrial fibrillation: Secondary | ICD-10-CM

## 2024-01-27 DIAGNOSIS — I70229 Atherosclerosis of native arteries of extremities with rest pain, unspecified extremity: Secondary | ICD-10-CM | POA: Diagnosis not present

## 2024-01-27 DIAGNOSIS — D500 Iron deficiency anemia secondary to blood loss (chronic): Secondary | ICD-10-CM | POA: Diagnosis not present

## 2024-01-27 LAB — CBC
HCT: 23.2 % — ABNORMAL LOW (ref 36.0–46.0)
Hemoglobin: 7.7 g/dL — ABNORMAL LOW (ref 12.0–15.0)
MCH: 30.9 pg (ref 26.0–34.0)
MCHC: 33.2 g/dL (ref 30.0–36.0)
MCV: 93.2 fL (ref 80.0–100.0)
Platelets: 193 10*3/uL (ref 150–400)
RBC: 2.49 MIL/uL — ABNORMAL LOW (ref 3.87–5.11)
RDW: 16.3 % — ABNORMAL HIGH (ref 11.5–15.5)
WBC: 8.8 10*3/uL (ref 4.0–10.5)
nRBC: 0 % (ref 0.0–0.2)

## 2024-01-27 LAB — COMPREHENSIVE METABOLIC PANEL WITH GFR
ALT: 14 U/L (ref 0–44)
AST: 32 U/L (ref 15–41)
Albumin: 2.5 g/dL — ABNORMAL LOW (ref 3.5–5.0)
Alkaline Phosphatase: 46 U/L (ref 38–126)
Anion gap: 9 (ref 5–15)
BUN: 20 mg/dL (ref 8–23)
CO2: 20 mmol/L — ABNORMAL LOW (ref 22–32)
Calcium: 8.1 mg/dL — ABNORMAL LOW (ref 8.9–10.3)
Chloride: 99 mmol/L (ref 98–111)
Creatinine, Ser: 0.63 mg/dL (ref 0.44–1.00)
GFR, Estimated: >60 mL/min (ref >60)
Glucose, Bld: 99 mg/dL (ref 70–99)
Potassium: 3.7 mmol/L (ref 3.5–5.1)
Sodium: 128 mmol/L — ABNORMAL LOW (ref 135–145)
Total Bilirubin: 2.5 mg/dL — ABNORMAL HIGH (ref 0.0–1.2)
Total Protein: 5.5 g/dL — ABNORMAL LOW (ref 6.5–8.1)

## 2024-01-27 LAB — BASIC METABOLIC PANEL WITH GFR
Anion gap: 3 — ABNORMAL LOW (ref 5–15)
BUN: 19 mg/dL (ref 8–23)
CO2: 21 mmol/L — ABNORMAL LOW (ref 22–32)
Calcium: 7.8 mg/dL — ABNORMAL LOW (ref 8.9–10.3)
Chloride: 100 mmol/L (ref 98–111)
Creatinine, Ser: 0.65 mg/dL (ref 0.44–1.00)
GFR, Estimated: >60 mL/min (ref >60)
Glucose, Bld: 89 mg/dL (ref 70–99)
Potassium: 3.7 mmol/L (ref 3.5–5.1)
Sodium: 124 mmol/L — ABNORMAL LOW (ref 135–145)

## 2024-01-27 LAB — CORTISOL-AM, BLOOD: Cortisol - AM: 21.9 ug/dL (ref 6.7–22.6)

## 2024-01-27 LAB — T4, FREE: Free T4: 1 ng/dL (ref 0.61–1.12)

## 2024-01-27 MED ORDER — POLYETHYLENE GLYCOL 3350 17 G PO PACK
34.0000 g | PACK | Freq: Once | ORAL | Status: DC
Start: 2024-01-28 — End: 2024-01-29

## 2024-01-27 MED ORDER — ATORVASTATIN CALCIUM 20 MG PO TABS
40.0000 mg | ORAL_TABLET | Freq: Every day | ORAL | Status: DC
  Administered 2024-01-28: 40 mg via ORAL
  Filled 2024-01-27 (×2): qty 2

## 2024-01-27 MED ORDER — SODIUM CHLORIDE 1 G PO TABS
1.0000 g | ORAL_TABLET | Freq: Two times a day (BID) | ORAL | Status: DC
  Administered 2024-01-27 – 2024-02-02 (×11): 1 g via ORAL
  Filled 2024-01-27 (×11): qty 1

## 2024-01-27 MED ORDER — POLYETHYLENE GLYCOL 3350 17 G PO PACK
34.0000 g | PACK | Freq: Once | ORAL | Status: AC
  Administered 2024-01-28: 34 g via ORAL
  Filled 2024-01-27: qty 2

## 2024-01-27 MED ORDER — SODIUM CHLORIDE 0.9 % IV BOLUS
1000.0000 mL | Freq: Once | INTRAVENOUS | Status: AC
  Administered 2024-01-27: 1000 mL via INTRAVENOUS

## 2024-01-27 NOTE — Progress Notes (Signed)
 Rounding Note    Patient Name: Melanie Browning Date of Encounter: 01/27/2024  [facility] Cardiologist: Belva Boyden, MD   Subjective   Patient reports ongoing pain and swelling in her left lower extremity. Telemetry is without recurrence of afib. Hemoglobin low at 7.8.   Inpatient Medications    Scheduled Meds:  amiodarone   200 mg Oral BID   apixaban   2.5 mg Oral BID   Chlorhexidine  Gluconate Cloth  6 each Topical Q0600   diclofenac Sodium  2 g Topical QID   levothyroxine   75 mcg Oral QAC breakfast   lidocaine   1 patch Transdermal Q24H   pantoprazole   40 mg Oral QHS   polyethylene glycol  17 g Oral BID   senna-docusate  1 tablet Oral BID   sodium phosphate   1 enema Rectal Once   ticagrelor   90 mg Oral BID   Continuous Infusions:  PRN Meds: acetaminophen , ALPRAZolam , diphenoxylate -atropine , HYDROmorphone  (DILAUDID ) injection, labetalol , magnesium  hydroxide, nitroGLYCERIN , ondansetron  (ZOFRAN ) IV, oxyCODONE , sodium phosphate  **FOLLOWED BY** sodium phosphate    Vital Signs    Vitals:   01/26/24 1132 01/26/24 1945 01/27/24 0459 01/27/24 0816  BP: (!) 169/78 122/70 131/60 118/70  Pulse: 90 94 87 88  Resp: 18 19  18   Temp:  98.8 F (37.1 C) 98.4 F (36.9 C) 98.1 F (36.7 C)  TempSrc:    Oral  SpO2: 100% 96% 98% 100%  Weight:      Height:        Intake/Output Summary (Last 24 hours) at 01/27/2024 1007 Last data filed at 01/26/2024 2145 Gross per 24 hour  Intake 480 ml  Output 1300 ml  Net -820 ml      01/20/2024    2:33 PM 12/24/2023    9:40 AM 12/01/2023    2:10 PM  Last 3 Weights  Weight (lbs) 145 lb 6.4 oz 141 lb 1.5 oz 142 lb  Weight (kg) 65.953 kg 64 kg 64.411 kg      Telemetry    Sinus tachycardia with PACs - Personally Reviewed  Physical Exam   GEN: No acute distress.   Neck: No JVD Cardiac: RRR, II/VI systolic murmur, rubs, or gallops.  Respiratory: Clear to auscultation bilaterally. GI: Soft, nontender, non-distended  MS: 2+ LLE  edema, 1+ RLE edema; No deformity. Neuro:  Nonfocal  Psych: Normal affect   Labs    High Sensitivity Troponin:   Recent Labs  Lab 01/21/24 0123 01/21/24 0510 01/21/24 0734 01/21/24 1145 01/21/24 1328  TROPONINIHS 166* 653* 866* 1,107* 1,018*     Chemistry Recent Labs  Lab 01/20/24 2226 01/21/24 0730 01/22/24 0506 01/23/24 0754 01/24/24 0353 01/25/24 0327 01/26/24 0518 01/27/24 0446  NA 137   < > 131* 132* 132* 130* 127* 124*  K 3.2*   < > 4.7 4.0 4.0 3.7 4.0 3.7  CL 107   < > 103 106 104 100 99 100  CO2 19*   < > 22 22 25 23 22  21*  GLUCOSE 109*   < > 134* 108* 104* 97 90 89  BUN 21   < > 28* 18 16 15 18 19   CREATININE 0.52   < > 0.98 0.74 0.74 0.59 0.59 0.65  CALCIUM  8.7*   < > 7.9* 8.0* 7.8* 8.0* 7.9* 7.8*  MG 1.5*   < > 2.2 1.8  --  1.8  --   --   PROT 6.5  --   --   --   --   --  5.2*  --  ALBUMIN 3.5  --  2.8* 2.7* 2.7*  --  2.6*  --   AST 34  --   --   --   --   --  27  --   ALT 23  --   --   --   --   --  15  --   ALKPHOS 44  --   --   --   --   --  40  --   BILITOT 1.1  --   --   --   --   --  1.9*  --   GFRNONAA >60   < > 58* >60 >60 >60 >60 >60  ANIONGAP 11   < > 6 4* 3* 7 6 3*   < > = values in this interval not displayed.    Lipids No results for input(s): "CHOL", "TRIG", "HDL", "LABVLDL", "LDLCALC", "CHOLHDL" in the last 168 hours.  Hematology Recent Labs  Lab 01/25/24 0327 01/26/24 0518 01/27/24 0446  WBC 11.0* 9.8 8.8  RBC 2.72* 2.55* 2.49*  HGB 8.4* 7.8* 7.7*  HCT 24.3* 23.0* 23.2*  MCV 89.3 90.2 93.2  MCH 30.9 30.6 30.9  MCHC 34.6 33.9 33.2  RDW 16.7* 16.4* 16.3*  PLT 129* 172 193   Thyroid   Recent Labs  Lab 01/26/24 0518 01/27/24 0446  TSH 8.093*  --   FREET4  --  1.00    BNPNo results for input(s): "BNP", "PROBNP" in the last 168 hours.  DDimer No results for input(s): "DDIMER" in the last 168 hours.   Radiology    CT Angio Abd/Pel w/ and/or w/o Result Date: 01/26/2024 IMPRESSION: 1. No evidence of hemodynamically  significant stenosis, aneurysm, or dissection within the abdominal vasculature. 2. Status post left superficial femoral artery stent graft and in-situ saphenous vein bypass graft with filling of numerous superficial venous varicosities within the left lower extremity through an incompletely embolized superficial venous collateral. The native greater saphenous vein is diminutive in caliber distal to the embolized venous collateral. 3. 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 This is of uncertain clinical significance. 4. Peripheral segment 4 portal vein thrombosis. Patency of the main, right, and left portal veins. 5. 8.4 cm hematoma within the left thigh. No active extravasation identified. 6. Asymmetric subcutaneous edema within the left lower extremity, possibly the effect venous hypertension related to arterial filling of the superficial venous system. Electronically Signed   By: Worthy Heads M.D.   On: 01/26/2024 22:00   DG Abd 1 View Result Date: 01/26/2024 IMPRESSION: Nonobstructive bowel gas pattern. Electronically Signed   By: Angus Bark M.D.   On: 01/26/2024 15:32   Cardiac Studies   2D echo 01/21/2024 1. Left ventricular ejection fraction, by estimation, is 60 to 65%. The  left ventricle has normal function. The left ventricle has no regional  wall motion abnormalities. Left ventricular diastolic parameters were  normal.   2. Right ventricular systolic function is normal. The right ventricular  size is normal. There is normal pulmonary artery systolic pressure. The  estimated right ventricular systolic pressure is 23.5 mmHg.   3. The mitral valve is normal in structure. No evidence of mitral valve  regurgitation. No evidence of mitral stenosis.   4. The aortic valve is normal in structure. There is mild calcification  of the aortic valve. Aortic valve regurgitation is not visualized. Aortic   valve sclerosis/calcification is present, without any evidence  of aortic  stenosis. Aortic valve mean  gradient measures 12.5 mmHg.   5. The inferior vena cava is normal in size with greater than 50%  respiratory variability, suggesting right atrial pressure of 3 mmHg.    01/20/2024 PV procedure   Percutaneous transluminal angioplasty and stent placement left SFA to 6 mm             3.  Percutaneous transluminal angioplasty and stent placement left tibioperoneal trunk and proximal posterior tibial artery with Esprit stents dilated to 3.5 mm distally and 4 mm proximally             4.  Introduction catheter into the venous branch of the failed saphenous vein bypass             5.  Coil embolization of the venous branch of the failed saphenous vein bypass using Ruby coils  Patient Profile     83 y.o. female with a past medical history of PVD with critical limb ischemia requiring intervention, claudication, coronary disease status post PCI to the LAD (10/2020), hypertension, hyperlipidemia, hypothyroidism, admitted 4/22 for limb salvage who cardiology was asked to evaluate for new onset atrial fibrillation with RVR.  Assessment & Plan    NSTEMI vs supply demand ischemia - In the setting of atrial fibrillation with RVR and known CAD with previous stent to the LAD 10/2020  - Echo with EF 60 to 65%, no RWMA - Troponin peaked at 1107 - Postintervention to the left lower extremity previously on tPA - Continue home Brilinta  and reduced dose Eliquis , aspirin  discontinued to lower bleeding risk - High risk of bleeding on Brilinta  and Eliquis , will check cytochrome p450 to evaluate if patient could transition to Plavix  for lower bleeding risk - No immediate plans for continued ischemic workup - Consider outpatient ischemic workup with cardiac PET stress  Atrial fibrillation with RVR - Exacerbated by severe leg pain, postprocedural, hypokalemia, and hypertensive urgency - Converted to sinus rhythm on  amiodarone  infusion - Continue on amiodarone  200 mg twice daily - Continued on apixaban  2.5 mg twice daily (reduced dose due to recurrent bleeding and drop in hemoglobin and need for DAPT) - No recurrence, continue telemetry  PAD - Severe lower extremity arterial disease - Intervention completed 01/20/2023 as detailed above - Postprocedural groin hematoma - Required blood transfusion for symptomatic anemia - Continued on Brilinta  and reduced dose Eliquis  - Ongoing management per VVS  Hyperlipidemia - Total cholesterol 323 and LDL 163 - Declines statin therapy and is hesitant about be PCSK9 inhibitor - Will check LP(a)  For questions or updates, please contact [facility] Please consult [email or web address] for contact info under        Signed, Brodie Cannon, PA-C  01/27/2024, 10:07 AM

## 2024-01-27 NOTE — Progress Notes (Signed)
 [facility]  - [place] [facility]   PATIENT NAME: Melanie Browning    MR#:  [id number]  DATE OF BIRTH:  1941/01/01  SUBJECTIVE:  Feels fatigued, working on a bowel movement. Reports poor po  VITALS:  Blood pressure 118/70, pulse 88, temperature 98.1 F (36.7 C), temperature source Oral, resp. rate 18, height 5\' 8"  (1.727 m), weight 66 kg, SpO2 100%.  PHYSICAL EXAMINATION:   GENERAL:  83 y.o.-year-old patient with no acute distress.  LUNGS: Normal breath sounds bilaterally, no wheezing CARDIOVASCULAR: S1, S2 normal. No murmur   ABDOMEN: Soft, nontender, nondistended. Bowel sounds present.  EXTREMITIES: left LE edema +, bruising on arms, depending bruising upper thighs and perineum NEUROLOGIC: nonfocal  patient is alert and awake   LABORATORY PANEL:  CBC Recent Labs  Lab 01/27/24 0446  WBC 8.8  HGB 7.7*  HCT 23.2*  PLT 193    Chemistries  Recent Labs  Lab 01/25/24 0327 01/26/24 0518 01/27/24 0446  NA 130* 127* 124*  K 3.7 4.0 3.7  CL 100 99 100  CO2 23 22 21*  GLUCOSE 97 90 89  BUN 15 18 19   CREATININE 0.59 0.59 0.65  CALCIUM  8.0* 7.9* 7.8*  MG 1.8  --   --   AST  --  27  --   ALT  --  15  --   ALKPHOS  --  40  --   BILITOT  --  1.9*  --     Assessment and Plan  83 y.o. 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   New a fib with RVR now rate controlled with STT changes NSTEMI -- history of coronary artery disease with prior stent placement to LAD in 2022 -- cardiology following -- patient is status post left lower extremity vascular procedure -- converted to sinus rhythm with amiodarone  infusion --  IV heparin  drip--> apixaban , brilinta  -- outpatient workup for chest pain. Patient currently is chest pain free  Severe peripheral  arterial disease lower extremity post procedure groin hematoma Anemia Hematoma --s/p Left lower extremity angiography third order catheter placement             3.  Percutaneous transluminal angioplasty and stent placement left SFA and popliteal artery.             4.  Percutaneous transluminal angioplasty left posterior tibial artery.             5.  Thrombectomy using the laser left SFA, popliteal and tibioperoneal trunk/posterior tibial             6.  Initiation of thrombolysis -- transfuse as needed --pt s/p 4 unit BT  per vascular sx  Hgb now stable high 7s, cardiology advises maintaining above 8, defer transfusion decision to primary team -- CTA shows thigh hematoma, no active bleeding  Electrolyte abnormality -- replace with electrolyte protocol by [facility]  Hyponatremia Sodium has drifted to 124. Euvolemic, suspect siadh. Urinary retention can do that. Am cortisol wnl, tsh wnl. No aki but patient reports intake is poor and urine sodium is low, so may also be hypovolemic - will give 1 liter NS - will start salt tablets  Hyperlipidemia -- patient clarifies she was taken off a statin in the past, didn't refuse it - will resume atorvastatin  40  Urinary retention New on 4/29,  urology placed foley, advises maintain for a week, then TOV  Hypothyroid Tsh wnl Home synthroid   Procedures:as above Family communication :none today    Raymonde Calico M.D    [facility]

## 2024-01-27 NOTE — Progress Notes (Signed)
 Progress Note    01/27/2024 8:09 AM 6 Days Post-Op  Subjective:   Melanie Browning is an 83 year old female now status postop day 1 from left lower extremity angiogram and lysis catheter with transluminal angioplasty and stent placement to the left SFA, transluminal angioplasty and stent placement to the left tibioperoneal trunk and proximal posterior tibial artery and coil embolization of the venous branch of the failed saphenous vein bypass using Ruby coils.   On exam this morning patient resting comfortably in bed.  Patient had Foley catheter placed last night by Dr. Dulcy Gibney from urology due to bladder distention.  Plan recommended was to leave 7 days before trial void again. She also endorses that she is having difficulty moving or lifting her left leg as it feels heavy.  There is noticeable +1 to +2 edema with reperfusion syndrome to her leg that received the lysis and now the stenting.  Patient also endorses this morning she is very constipated.  Bowel regime was ordered last night and this morning to help alleviate constipation.  Patient was seen by cardiology for atrial fibrillation with RVR during the procedure.  Cardiology okay with the current anticoagulation but recommends switching from Brilinta  back to Plavix  if acceptable for lower bleeding risk.  Patient remains on amiodarone  200 mg twice daily.  Patient is currently in normal sinus rhythm.  No complaints overnight vitals are remained stable  Review of Systems  Constitutional:  Constitutional negative. Respiratory: Respiratory negative.  Cardiovascular: Cardiovascular negative.  GI: Gastrointestinal negative.  GU: Genitourinary negative. Musculoskeletal: Musculoskeletal negative.  Skin: Skin negative.  Neurological: Neurological negative. Psychiatric: Psychiatric negative.  All other systems reviewed and are negative  Vitals:   01/26/24 1945 01/27/24 0459  BP: 122/70 131/60  Pulse: 94 87  Resp: 19   Temp: 98.8 F (37.1  C) 98.4 F (36.9 C)  SpO2: 96% 98%   Physical Exam: Cardiac:  RRR, normal S1 and S2.  No rubs clicks gallops or murmurs noted this morning. Lungs: [name] on auscultation throughout but diminished in the bases.  No rales rhonchi or wheezing noted.  Nonlabored breathing. Incisions: Right groin incision clean dry and intact.  Erythema noted to incision site. Extremities: Bilateral lower extremities warm to touch both with positive Doppler DP and PT pulses that are strong. Abdomen: Positive bowel sounds throughout, soft, nontender and positive distendtion due to constipation. Bladder distention on exam resolved with Foley catheter in place. Neurologic: Alert and oriented x 3, answers all questions and follows commands appropriately.  CBC    Component Value Date/Time   WBC 8.8 01/27/2024 0446   RBC 2.49 (L) 01/27/2024 0446   HGB 7.7 (L) 01/27/2024 0446   HGB 13.2 06/17/2023 0931   HCT 23.2 (L) 01/27/2024 0446   HCT 39.8 06/17/2023 0931   PLT 193 01/27/2024 0446   PLT 181 06/17/2023 0931   MCV 93.2 01/27/2024 0446   MCV 99 (H) 06/17/2023 0931   MCH 30.9 01/27/2024 0446   MCHC 33.2 01/27/2024 0446   RDW 16.3 (H) 01/27/2024 0446   RDW 13.1 06/17/2023 0931   LYMPHSABS 0.8 01/03/2023 1142   MONOABS 0.5 01/03/2023 1142   EOSABS 0.1 01/03/2023 1142   BASOSABS 0.0 01/03/2023 1142    BMET    Component Value Date/Time   NA 127 (L) 01/26/2024 0518   K 4.0 01/26/2024 0518   CL 99 01/26/2024 0518   CO2 22 01/26/2024 0518   GLUCOSE 90 01/26/2024 0518   BUN 18 01/26/2024 0518   CREATININE  0.59 01/26/2024 0518   CALCIUM  7.9 (L) 01/26/2024 0518   GFRNONAA >60 01/26/2024 0518    INR No results found for: "INR"   Intake/Output Summary (Last 24 hours) at 01/27/2024 0809 Last data filed at 01/26/2024 2145 Gross per 24 hour  Intake 480 ml  Output 1300 ml  Net -820 ml     Assessment/Plan:  83 y.o. female is s/p left lower extremity angiogram with lysis catheter and multiple angioplasty  with stent placement. Also noted to have placed Ruby coils for failed saphenous vein bypass graft.  6 Days Post-Op   PLAN Continue ASA 81 mg p.o. daily, Brilinta  90 mg p.o. daily, and Eliquis  2.5 mg twice daily  Foley catheter placed by urology yesterday.  Will stay in place for 7 days before trial of void again. Heart healthy diet. Pain medication as needed CMP ordered for this afternoon to follow-up on liver enzymes. CBC and BMP in the morning to monitor hemoglobin and electrolytes.  DVT prophylaxis: ASA 81 mg daily and Eliquis  5 mg daily.   Annamaria Barrette [facility] 01/27/2024 8:09 AM

## 2024-01-27 NOTE — Plan of Care (Signed)

## 2024-01-27 NOTE — Progress Notes (Signed)
 Patient visit this evening  Upon entering to see Mrs. Antczak she is lying on her side with the lights off in the room.  She states she has had a very rough day.  She did have some results but only after a fleets enema.  She is mostly troubled by nausea and some nonspecific abdominal pain.  Overall she is just feeling very tired has no appetite.  I believe that she is still constipated that the enema has only been partially successful and I talked with her about repeating the MiraLAX  with prune juice again tomorrow.  She is okay with this.  I think depending on the results of those 2 doses a follow-up KUB would be helpful.  She does have a history of colitis which is usually manifest with diarrhea however under the circumstances her constipation may be preventing the elimination of liquid stool.  Hopefully after another successful bowel movement or 2 we will see significant improvement in her abdominal complaints.  Alternatively I did discuss the finding of partial thrombosis in the portal vein.  This is of uncertain chronicity.  It seems fairly implausible that she could have form portal vein thrombus on this admission considering the tPA infusion as well as the near continuous anticoagulation with heparin  and Eliquis .  Nevertheless, if this were acute it may also explain some of her abdominal complaints.  The fact that she is currently fully anticoagulated and taking Brilinta  would be the treatment and so if this is indeed acute it is being appropriately managed.  CMP results are still pending.  Hemoglobin is stable.  No other changes at this time.

## 2024-01-27 NOTE — Progress Notes (Signed)
   01/27/24 1315  Spiritual Encounters  Type of Visit Follow up  Care provided to: Patient  Conversation partners present during encounter Nurse  Reason for visit Advance directives  OnCall Visit No   Chaplain was proactively following up with patient regarding AD paperwork Chaplain provided to patient yesterday.  Patient was sleeping when Chaplain arrived.  Chaplain had shared with patient yesterday that in order to get in touch with Chaplain Services, she'd just let the Nurse know and they'd page the Chaplain or put in a Consult.  Chaplain will follow up as needed/requested by patient and/or staff.    Rev. Rana M. Nolon Baxter, M.Div. Chaplain [facility]

## 2024-01-28 DIAGNOSIS — I70229 Atherosclerosis of native arteries of extremities with rest pain, unspecified extremity: Secondary | ICD-10-CM | POA: Diagnosis not present

## 2024-01-28 DIAGNOSIS — I214 Non-ST elevation (NSTEMI) myocardial infarction: Secondary | ICD-10-CM | POA: Diagnosis not present

## 2024-01-28 DIAGNOSIS — I480 Paroxysmal atrial fibrillation: Secondary | ICD-10-CM | POA: Diagnosis not present

## 2024-01-28 DIAGNOSIS — D500 Iron deficiency anemia secondary to blood loss (chronic): Secondary | ICD-10-CM | POA: Diagnosis not present

## 2024-01-28 LAB — CBC
HCT: 23.1 % — ABNORMAL LOW (ref 36.0–46.0)
Hemoglobin: 7.9 g/dL — ABNORMAL LOW (ref 12.0–15.0)
MCH: 31.2 pg (ref 26.0–34.0)
MCHC: 34.2 g/dL (ref 30.0–36.0)
MCV: 91.3 fL (ref 80.0–100.0)
Platelets: 252 10*3/uL (ref 150–400)
RBC: 2.53 MIL/uL — ABNORMAL LOW (ref 3.87–5.11)
RDW: 15.9 % — ABNORMAL HIGH (ref 11.5–15.5)
WBC: 10.1 10*3/uL (ref 4.0–10.5)
nRBC: 0.2 % (ref 0.0–0.2)

## 2024-01-28 LAB — FERRITIN: Ferritin: 96 ng/mL (ref 11–307)

## 2024-01-28 LAB — BASIC METABOLIC PANEL WITH GFR
Anion gap: 10 (ref 5–15)
BUN: 20 mg/dL (ref 8–23)
CO2: 21 mmol/L — ABNORMAL LOW (ref 22–32)
Calcium: 8.1 mg/dL — ABNORMAL LOW (ref 8.9–10.3)
Chloride: 98 mmol/L (ref 98–111)
Creatinine, Ser: 0.6 mg/dL (ref 0.44–1.00)
GFR, Estimated: >60 mL/min (ref >60)
Glucose, Bld: 87 mg/dL (ref 70–99)
Potassium: 3.6 mmol/L (ref 3.5–5.1)
Sodium: 129 mmol/L — ABNORMAL LOW (ref 135–145)

## 2024-01-28 LAB — VITAMIN B12: Vitamin B-12: 2103 pg/mL — ABNORMAL HIGH (ref 180–914)

## 2024-01-28 LAB — IRON AND TIBC
Iron: 27 ug/dL — ABNORMAL LOW (ref 28–170)
Saturation Ratios: 12 % (ref 10.4–31.8)
TIBC: 232 ug/dL — ABNORMAL LOW (ref 250–450)
UIBC: 205 ug/dL

## 2024-01-28 LAB — LIPOPROTEIN A (LPA): Lipoprotein (a): 15.8 nmol/L (ref <75.0)

## 2024-01-28 MED ORDER — SENNOSIDES-DOCUSATE SODIUM 8.6-50 MG PO TABS
2.0000 | ORAL_TABLET | Freq: Two times a day (BID) | ORAL | Status: DC
  Administered 2024-01-28: 2 via ORAL
  Filled 2024-01-28 (×2): qty 2

## 2024-01-28 MED ORDER — LEVOTHYROXINE SODIUM 100 MCG PO TABS
100.0000 ug | ORAL_TABLET | Freq: Every day | ORAL | Status: DC
  Administered 2024-01-29 – 2024-02-02 (×5): 100 ug via ORAL
  Filled 2024-01-28 (×6): qty 1

## 2024-01-28 MED ORDER — POLYSACCHARIDE IRON COMPLEX 150 MG PO CAPS
150.0000 mg | ORAL_CAPSULE | Freq: Every day | ORAL | Status: DC
  Administered 2024-01-28 – 2024-01-29 (×2): 150 mg via ORAL
  Filled 2024-01-28 (×2): qty 1

## 2024-01-28 NOTE — Progress Notes (Signed)
 PT Cancellation Note  Patient Details Name: Melanie Browning MRN: [id number] DOB: 1941-04-30   Cancelled Treatment:    Reason Eval/Treat Not Completed: Other (comment) (twice attempted treatment; pt working on a chicken quesadilla on first attempt, then later visiting with friend and exhausted from high volume bowel schedule today.) We made a plan to reschedule PT treatment for AM next day.    4:09 PM, 01/28/24 Dawn Eth, PT, DPT Physical Therapist - [facility]  [phone] (ASCOM)     [name] 01/28/2024, 4:04 PM

## 2024-01-28 NOTE — Progress Notes (Signed)
  Progress Note   Patient: Melanie Browning ZOX:[id number] DOB: 1940/10/19 DOA: 01/20/2024     8 DOS: the patient was seen and examined on 01/28/2024   Brief hospital course: 83 y.o. 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 Patient had a left lower extremity angioplasty and stent placement on 4/23.  Postoperatively, patient was transferred to ICU due to new onset atrial fibrillation with IVR, patient also with acute blood loss anemia, required multiple units of blood transfusion.  Also had urinary retension, Foley catheter was anchored by urology.   Principal Problem:   Atherosclerosis of artery of extremity with rest pain [facility]) Active Problems:   Atrial fibrillation (HCC)   Hypotension   Non-ST elevation (NSTEMI) myocardial infarction (HCC)   Blood loss anemia   Elevated troponin   Assessment and Plan: New a fib with RVR now rate controlled with STT changes NSTEMI -- history of coronary artery disease with prior stent placement to LAD in 2022 -- converted to sinus rhythm with amiodarone  infusion --  IV heparin  drip--> apixaban , brilinta  -- outpatient workup for chest pain. Patient currently is chest pain free   Severe peripheral arterial disease lower extremity status post angioplasty and stent placement. post procedure groin hematoma Acute blood loss anemia. Patient's status post peripheral arterial angioplasty and stent placement.  Postoperatively, patient developed acute blood loss anemia and right groin hematoma. --pt s/p 4 unit BT  per vascular sx  Hemoglobin 7.9 today, mild iron deficiency, start oral iron.  Also check B12 level.   Hyponatremia Hypokalemia Hypomagnesemia. Sodium level still low, received IV fluid yesterday.  Added salt tablets.  Also start fluid restriction.   Hyperlipidemia -- patient clarifies she was taken  off a statin in the past, didn't refuse it - will resume atorvastatin  40   Urinary retention New on 4/29, urology placed foley, advises maintain for a week, then TOV   Hypothyroid TSH 8.09, increase Synthroid  to 100 mcg daily.      Subjective:  Patient doing well today, still constipated.  Senna added.  Also has a poor appetite, do not like any protein supplement.  Physical Exam: Vitals:   01/27/24 2031 01/27/24 2339 01/28/24 0350 01/28/24 0757  BP: 118/88 130/70 (!) 151/63 119/64  Pulse: 86 81 77 83  Resp: 18 18 18 18   Temp: 98 F (36.7 C) 98.5 F (36.9 C) 97.8 F (36.6 C) 97.8 F (36.6 C)  TempSrc:  Oral    SpO2: 99% 99% 95% 98%  Weight:      Height:       General exam: Appears calm and comfortable  Respiratory system: Clear to auscultation. Respiratory effort normal. Cardiovascular system: S1 & S2 heard, RRR. No JVD, murmurs, rubs, gallops or clicks.  Gastrointestinal system: Abdomen is nondistended, soft and nontender. No organomegaly or masses felt. Normal bowel sounds heard. Central nervous system: Alert and oriented. No focal neurological deficits. Extremities: 2+ left leg edema. Skin: No rashes, lesions or ulcers Psychiatry: Judgement and insight appear normal. Mood & affect appropriate.    Data Reviewed:  Reviewed surgical note, imaging study results, lab results.  Family Communication: None  Disposition: Status is: Inpatient Remains inpatient appropriate because: Unsafe discharge, pending nursing placement.     Time spent: 50 minutes  Author: Donaciano Frizzle, MD 01/28/2024 11:56 AM  For on call review [email or web address].

## 2024-01-28 NOTE — Progress Notes (Signed)
 Progress Note    01/28/2024 7:40 AM 7 Days Post-Op  Subjective:  Melanie Browning is an 83 year old female now status postop day 1 from left lower extremity angiogram and lysis catheter with transluminal angioplasty and stent placement to the left SFA, transluminal angioplasty and stent placement to the left tibioperoneal trunk and proximal posterior tibial artery and coil embolization of the venous branch of the failed saphenous vein bypass using Ruby coils.   Patient is noted to have urinary retention after the procedure.  There was difficulty in placing Foley catheter therefore urology was consulted.  A catheter was placed or remain for 7 days prior to next trial void.  Patient also was seen by cardiology for atrial fibrillation with RVR during the procedure.  Cardiology is okay with current anticoagulation but recommend switching from Brilinta  back to Plavix  if acceptable for lower bleeding risks.  Patient also remains on amiodarone  200 twice daily for new onset A-fib.  Currently patient is in sinus rhythm.  Patient also endorses constipation.  She was given a regime to help move her bowels yesterday which included an enema.  Post enema she had a large bowel movement but still remains uncomfortable this morning as she feels she still needs to evacuate her bowels. No complaints overnight and vitals all remained stable.  Review of Systems  Constitutional:  Constitutional negative. Respiratory: Respiratory negative.  Cardiovascular: Cardiovascular negative.  GI: Gastrointestinal negative.  GU: Genitourinary negative. Musculoskeletal: Musculoskeletal negative.  Skin: Skin negative.  Neurological: Neurological negative. Psychiatric: Psychiatric negative.  All other systems reviewed and are negative   Vitals:   01/27/24 2339 01/28/24 0350  BP: 130/70 (!) 151/63  Pulse: 81 77  Resp: 18 18  Temp: 98.5 F (36.9 C) 97.8 F (36.6 C)  SpO2: 99% 95%   Physical Exam: Cardiac:  RRR, normal S1  and S2.  No rubs clicks gallops or murmurs noted this morning. Lungs: [name] on auscultation throughout but diminished in the bases.  No rales rhonchi or wheezing noted.  Nonlabored breathing. Incisions: Right groin incision clean dry and intact.  Erythema noted to incision site. Extremities: Bilateral lower extremities warm to touch both with positive Doppler DP and PT pulses that are strong.  Noted swelling to left thigh now distinctly less today.  Still remains with +2 edema to the left lower extremity. Abdomen: Positive bowel sounds throughout, soft, nontender and positive distendtion due to constipation. Bladder distention on exam resolved with Foley catheter in place. Neurologic: Alert and oriented x 3, answers all questions and follows commands appropriately.  CBC    Component Value Date/Time   WBC 10.1 01/28/2024 0419   RBC 2.53 (L) 01/28/2024 0419   HGB 7.9 (L) 01/28/2024 0419   HGB 13.2 06/17/2023 0931   HCT 23.1 (L) 01/28/2024 0419   HCT 39.8 06/17/2023 0931   PLT 252 01/28/2024 0419   PLT 181 06/17/2023 0931   MCV 91.3 01/28/2024 0419   MCV 99 (H) 06/17/2023 0931   MCH 31.2 01/28/2024 0419   MCHC 34.2 01/28/2024 0419   RDW 15.9 (H) 01/28/2024 0419   RDW 13.1 06/17/2023 0931   LYMPHSABS 0.8 01/03/2023 1142   MONOABS 0.5 01/03/2023 1142   EOSABS 0.1 01/03/2023 1142   BASOSABS 0.0 01/03/2023 1142    BMET    Component Value Date/Time   NA 129 (L) 01/28/2024 0419   K 3.6 01/28/2024 0419   CL 98 01/28/2024 0419   CO2 21 (L) 01/28/2024 0419   GLUCOSE 87 01/28/2024 0419  BUN 20 01/28/2024 0419   CREATININE 0.60 01/28/2024 0419   CALCIUM  8.1 (L) 01/28/2024 0419   GFRNONAA >60 01/28/2024 0419    INR No results found for: "INR"   Intake/Output Summary (Last 24 hours) at 01/28/2024 0740 Last data filed at 01/28/2024 0351 Gross per 24 hour  Intake --  Output 750 ml  Net -750 ml     Assessment/Plan:  83 y.o. female is s/p left lower extremity angiogram with lysis  catheter and multiple angioplasty with stent placement. Also noted to have placed Ruby coils for failed saphenous vein bypass graft.  7 Days Post-Op   PLAN Continue ASA 81 mg p.o. daily, Brilinta  90 mg p.o. daily, and Eliquis  2.5 mg twice daily  Foley catheter placed by urology 01/26/24.  Will stay in place for 7 days before trial of void again. Heart healthy diet. Pain medication as needed CMP from yesterday was within normal limits with lower protein levels.  Total protein 5.5 and albumin 2.5.  Liver function test were all normal. Continue bowel regiment of prune juice with MiraLAX  to help with constipation. CBC and BMP in the morning to monitor hemoglobin and electrolytes.   DVT prophylaxis: ASA 81 mg daily and Eliquis  5 mg daily.   Annamaria Barrette [facility] 01/28/2024 7:40 AM

## 2024-01-28 NOTE — Plan of Care (Signed)
   Problem: Education: Goal: Knowledge of General Education information will improve Description Including pain rating scale, medication(s)/side effects and non-pharmacologic comfort measures Outcome: Progressing   Problem: Health Behavior/Discharge Planning: Goal: Ability to manage health-related needs will improve Outcome: Progressing

## 2024-01-28 NOTE — Progress Notes (Signed)
   01/28/24 0930  Spiritual Encounters  Type of Visit Follow up  Care provided to: Patient  Conversation partners present during encounter Nurse  Reason for visit Advance directives  OnCall Visit No   Chaplain followed up with patient regarding AD.  Patient asked if Chaplain could find out if patient's old AD was in the system and if not, could Chaplain find out about if her facility could send it.  Chaplain spoke with Nurse and Emailed Case Worker.  Case Worker replied that she'd work with Diplomatic Services operational officer to get the old AD uploaded, if the facility has it.    Rev. Rana M. Nolon Baxter, M.Div. Chaplain Resident  [facility]

## 2024-01-28 NOTE — Hospital Course (Signed)
 83 y.o. 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 Patient had a left lower extremity angioplasty and stent placement on 4/23.  Postoperatively, patient was transferred to ICU due to new onset atrial fibrillation with IVR, patient also with acute blood loss anemia, required multiple units of blood transfusion.  Also had urinary retension, Foley catheter was anchored by urology.

## 2024-01-29 ENCOUNTER — Inpatient Hospital Stay

## 2024-01-29 DIAGNOSIS — K56609 Unspecified intestinal obstruction, unspecified as to partial versus complete obstruction: Secondary | ICD-10-CM

## 2024-01-29 DIAGNOSIS — D500 Iron deficiency anemia secondary to blood loss (chronic): Secondary | ICD-10-CM | POA: Diagnosis not present

## 2024-01-29 DIAGNOSIS — I480 Paroxysmal atrial fibrillation: Secondary | ICD-10-CM | POA: Diagnosis not present

## 2024-01-29 DIAGNOSIS — I70229 Atherosclerosis of native arteries of extremities with rest pain, unspecified extremity: Secondary | ICD-10-CM | POA: Diagnosis not present

## 2024-01-29 DIAGNOSIS — I214 Non-ST elevation (NSTEMI) myocardial infarction: Secondary | ICD-10-CM | POA: Diagnosis not present

## 2024-01-29 DIAGNOSIS — K567 Ileus, unspecified: Secondary | ICD-10-CM | POA: Diagnosis not present

## 2024-01-29 LAB — CBC
HCT: 23.1 % — ABNORMAL LOW (ref 36.0–46.0)
Hemoglobin: 7.6 g/dL — ABNORMAL LOW (ref 12.0–15.0)
MCH: 30.9 pg (ref 26.0–34.0)
MCHC: 32.9 g/dL (ref 30.0–36.0)
MCV: 93.9 fL (ref 80.0–100.0)
Platelets: 284 10*3/uL (ref 150–400)
RBC: 2.46 MIL/uL — ABNORMAL LOW (ref 3.87–5.11)
RDW: 16.1 % — ABNORMAL HIGH (ref 11.5–15.5)
WBC: 9.6 10*3/uL (ref 4.0–10.5)
nRBC: 0 % (ref 0.0–0.2)

## 2024-01-29 LAB — PREPARE RBC (CROSSMATCH)

## 2024-01-29 MED ORDER — IRON SUCROSE 300 MG IVPB - SIMPLE MED
300.0000 mg | Freq: Once | Status: AC
  Administered 2024-01-30: 300 mg via INTRAVENOUS
  Filled 2024-01-29: qty 300

## 2024-01-29 MED ORDER — FUROSEMIDE 10 MG/ML IJ SOLN
40.0000 mg | Freq: Once | INTRAMUSCULAR | Status: DC
  Filled 2024-01-29: qty 4

## 2024-01-29 MED ORDER — SENNOSIDES-DOCUSATE SODIUM 8.6-50 MG PO TABS: 2.0000 | ORAL_TABLET | Freq: Two times a day (BID) | ORAL | Status: DC | PRN

## 2024-01-29 MED ORDER — SODIUM CHLORIDE 0.9% IV SOLUTION: Freq: Once | INTRAVENOUS | Status: AC

## 2024-01-29 NOTE — Plan of Care (Signed)

## 2024-01-29 NOTE — Progress Notes (Signed)
 Physical Therapy Treatment Patient Details Name: Melanie Browning MRN: [id number] DOB: January 05, 1941 Today's Date: 01/29/2024   History of Present Illness Melanie Browning is an 83yoF comes to [facility] 4/22 for LLE angiography, thrombectomy, and stent placement for limb salvage. PMH: PVD with critical limb ischemia, CAD s/p PCI to LAD 10/2020, HTN. Post procedure pt developed AF Browning RVR, hypotension, noted ABLA Hb down to 5.9. Pt required CCU transfer. At baseline pt very active, exercises regularly, lives at [facility] with husband.    PT Comments  Pt seen after meal, agreeable to PT session. Pt partakes in AA/ROM, AR/ROM of BLE and trunk/BUE strengthening for functional mobility. Scattered areas of stiffness in hips and ankles at end range, noted areas of edema and/or mild ecchymosis. Pt left in upright sitting position. Session ended early for KUB. Will continue to follow.    If plan is discharge home, recommend the following: A little help with walking and/or transfers;A little help with bathing/dressing/bathroom;Assistance with cooking/housework;Assist for transportation;Help with stairs or ramp for entrance   Can travel by private vehicle     Yes  Equipment Recommendations  Rolling walker (2 wheels);BSC/3in1    Recommendations for Other Services       Precautions / Restrictions Precautions Precautions: Fall Recall of Precautions/Restrictions: Intact Restrictions Weight Bearing Restrictions Per Provider Order: No     Mobility  Bed Mobility                    Transfers                        Ambulation/Gait                   Stairs             Wheelchair Mobility     Tilt Bed    Modified Rankin (Stroke Patients Only)       Balance                                            Communication    Cognition Arousal: Alert Behavior During Therapy: WFL for tasks assessed/performed, Anxious   PT - Cognitive impairments: No apparent  impairments                         Following commands: Intact      Cueing    Exercises Other Exercises Other Exercises: supine heel slides AA/ROM 1x15 bilat; supine manually resisted hip/knee extension x15 bilat Other Exercises: supine SAQ 1x15 bilat Other Exercises: reclined to upright sitting with 2 hand pull bed sheet from foot board 1x8 bilat    General Comments        Pertinent Vitals/Pain Pain Assessment Pain Assessment: No/denies pain (some ABD discomfort, some transient tightness pain with ankles and hips at end of tolerated rnges.) Pain Intervention(s): Limited activity within patient's tolerance, Monitored during session, Premedicated before session, Repositioned    Home Living                          Prior Function            PT Goals (current goals can now be found in the care plan section) Acute Rehab PT Goals Patient Stated Goal: to get stronger PT Goal Formulation: With patient Time  For Goal Achievement: 02/06/24 Potential to Achieve Goals: Good Progress towards PT goals: Progressing toward goals    Frequency    Min 3X/week      PT Plan      Co-evaluation              AM-PAC PT "6 Clicks" Mobility   Outcome Measure  Help needed turning from your back to your side while in a flat bed without using bedrails?: A Lot Help needed moving from lying on your back to sitting on the side of a flat bed without using bedrails?: A Lot Help needed moving to and from a bed to a chair (including a wheelchair)?: A Lot Help needed standing up from a chair using your arms (e.g., wheelchair or bedside chair)?: A Lot Help needed to walk in hospital room?: Total Help needed climbing 3-5 steps with a railing? : Total 6 Click Score: 10    End of Session   Activity Tolerance: Patient tolerated treatment well;No increased pain Patient left: in bed;with nursing/sitter in room;with call bell/phone within reach Nurse Communication:  Mobility status PT Visit Diagnosis: Unsteadiness on feet (R26.81);Muscle weakness (generalized) (M62.81);Other abnormalities of gait and mobility (R26.89)     Time: 1610-9604 PT Time Calculation (min) (ACUTE ONLY): 15 min  Charges:    $Therapeutic Exercise: 8-22 mins PT General Charges $$ ACUTE PT VISIT: 1 Visit                    10:34 AM, 01/29/24 Melanie Browning, PT, DPT Physical Therapist - [facility]  [phone] (ASCOM)    Melanie Browning 01/29/2024, 10:33 AM

## 2024-01-29 NOTE — Progress Notes (Signed)
 Progress Note    01/29/2024 7:59 AM 8 Days Post-Op  Subjective:  Melanie Browning is an 83 year old female now status postop day 1 from left lower extremity angiogram and lysis catheter with transluminal angioplasty and stent placement to the left SFA, transluminal angioplasty and stent placement to the left tibioperoneal trunk and proximal posterior tibial artery and coil embolization of the venous branch of the failed saphenous vein bypass using Ruby coils.    Patient is noted to have urinary retention after the procedure.  There was difficulty in placing Foley catheter therefore urology was consulted.  A catheter was placed or remain for 7 days prior to next trial void.  Patient also was seen by cardiology for atrial fibrillation with RVR during the procedure.  Cardiology is okay with current anticoagulation but recommend switching from Brilinta  back to Plavix  if acceptable for lower bleeding risks.  Patient also remains on amiodarone  200 twice daily for new onset A-fib.  Currently patient is in sinus rhythm.  Patient also endorses constipation.  She was given a regime to help move her bowels yesterday which included an enema.  Post enema she had a large bowel movement but still remains uncomfortable this morning as she feels she still needs to evacuate her bowels. No complaints overnight and vitals all remained stable.  Review of Systems  Constitutional:  Constitutional negative. Respiratory: Respiratory negative.  Cardiovascular: Cardiovascular negative.  GI: Gastrointestinal negative.  GU: Genitourinary negative. Musculoskeletal: Musculoskeletal negative.  Skin: Skin negative.  Neurological: Neurological negative. Psychiatric: Psychiatric negative.  All other systems reviewed and are negative Vitals:   01/29/24 0024 01/29/24 0444  BP: 120/64 (!) 131/56  Pulse: 81 80  Resp: 19 18  Temp: 98 F (36.7 C) 98.5 F (36.9 C)  SpO2: 99% 97%   Physical Exam: Cardiac:  RRR, normal S1 and  S2.  No rubs clicks gallops or murmurs noted this morning. Lungs: [name] on auscultation throughout but diminished in the bases.  No rales rhonchi or wheezing noted.  Nonlabored breathing. Incisions: Right groin incision clean dry and intact.  Erythema noted to incision site. Extremities: Bilateral lower extremities warm to touch both with positive Doppler DP and PT pulses that are strong.  Noted swelling to left thigh now distinctly less today.  Still remains with +2 edema to the left lower extremity. Abdomen: Positive bowel sounds throughout, soft, nontender and positive distendtion due to constipation. Bladder distention on exam resolved with Foley catheter in place. Neurologic: Alert and oriented x 3, answers all questions and follows commands appropriately.  CBC    Component Value Date/Time   WBC 10.1 01/28/2024 0419   RBC 2.53 (L) 01/28/2024 0419   HGB 7.9 (L) 01/28/2024 0419   HGB 13.2 06/17/2023 0931   HCT 23.1 (L) 01/28/2024 0419   HCT 39.8 06/17/2023 0931   PLT 252 01/28/2024 0419   PLT 181 06/17/2023 0931   MCV 91.3 01/28/2024 0419   MCV 99 (H) 06/17/2023 0931   MCH 31.2 01/28/2024 0419   MCHC 34.2 01/28/2024 0419   RDW 15.9 (H) 01/28/2024 0419   RDW 13.1 06/17/2023 0931   LYMPHSABS 0.8 01/03/2023 1142   MONOABS 0.5 01/03/2023 1142   EOSABS 0.1 01/03/2023 1142   BASOSABS 0.0 01/03/2023 1142    BMET    Component Value Date/Time   NA 129 (L) 01/28/2024 0419   K 3.6 01/28/2024 0419   CL 98 01/28/2024 0419   CO2 21 (L) 01/28/2024 0419   GLUCOSE 87 01/28/2024 0419  BUN 20 01/28/2024 0419   CREATININE 0.60 01/28/2024 0419   CALCIUM  8.1 (L) 01/28/2024 0419   GFRNONAA >60 01/28/2024 0419    INR No results found for: "INR"  No intake or output data in the 24 hours ending 01/29/24 0759   Assessment/Plan:  83 y.o. female is s/p left lower extremity angiogram with lysis catheter and multiple angioplasty with stent placement. Also noted to have placed Ruby coils for  failed saphenous vein bypass graft.  8 Days Post-Op   PLAN Continue ASA 81 mg p.o. daily, Brilinta  90 mg p.o. daily, and Eliquis  2.5 mg twice daily  Foley catheter placed by urology 01/26/24.  Will stay in place for 7 days before trial of void again. Heart healthy diet. Pain medication as needed CBC this morning. Will follow up when completed.  Continue bowel regiment of prune juice with MiraLAX  to help with constipation. Ordered KUB due to patients +1 distention. Will follow up.    DVT prophylaxis: ASA 81 mg daily and Eliquis  5 mg daily.   Annamaria Barrette [facility] 01/29/2024 7:59 AM

## 2024-01-29 NOTE — Progress Notes (Signed)
 S: Still feels distended and is having significant abdominal cramps.  For the most part she is staying in bed and having great difficulty participating with physical therapy although to her credit she did try it earlier today.  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  Her post procedural course has been complicated with urinary retention and now an ileus.  At this point the fecal impaction has been treated and her bladder is decompressed with a Foley catheter.  However given her distention and the degree of discomfort she is in I will order a rectal tube placed.  If this does not provide significant improvement over the next day I will ask for a GI consult.  We will also replete her electrolytes.

## 2024-01-29 NOTE — Evaluation (Signed)
 Occupational Therapy Evaluation Patient Details Name: Melanie Browning MRN: [id number] DOB: Sep 23, 1941 Today's Date: 01/29/2024   History of Present Illness   Melanie Browning is an 83yoF comes to [facility] 4/22 for LLE angiography, thrombectomy, and stent placement for limb salvage. PMH: PVD with critical limb ischemia, CAD s/p PCI to LAD 10/2020, HTN. Post procedure pt developed AF c RVR, hypotension, noted ABLA Hb down to 5.9. Pt required CCU transfer. At baseline pt very active, exercises regularly, lives at [facility] with husband.     Clinical Impressions Patient presenting with decreased Ind in self care,balance, functional mobility/transfers, endurance, and safety awareness. Patient reports being Ind at baseline and living in ILF with husband. Pt endorses being very active with several types of exercise groups. Patient reports increased stomach cramping and fatigue. She declines OOB activity and requests assistance to urgently get onto bed pan. Pt rolls to the R with min A and pan placed. She requests to sit on it for a few minutes and to call nursing when done. Call bell and all needed items within reach upon exiting the room.  Patient will benefit from acute OT to increase overall independence in the areas of ADLs, functional mobility, and safety awareness in order to safely discharge.     If plan is discharge home, recommend the following:   A little help with walking and/or transfers;A little help with bathing/dressing/bathroom;Assistance with cooking/housework;Assist for transportation;Help with stairs or ramp for entrance     Functional Status Assessment   Patient has had a recent decline in their functional status and demonstrates the ability to make significant improvements in function in a reasonable and predictable amount of time.     Equipment Recommendations   BSC/3in1      Precautions/Restrictions   Precautions Precautions: Fall Recall of Precautions/Restrictions: Intact      Mobility Bed Mobility Overal bed mobility: Needs Assistance Bed Mobility: Rolling Rolling: Supervision              Transfers                   General transfer comment: pt declines          ADL either performed or assessed with clinical judgement   ADL Overall ADL's : Needs assistance/impaired                                       General ADL Comments: pt rolling in bed with min A to place bed pan.     Vision Patient Visual Report: No change from baseline              Pertinent Vitals/Pain Pain Assessment Pain Assessment: Faces Faces Pain Scale: Hurts even more Pain Location: LLE Pain Descriptors / Indicators: Grimacing, Guarding, Discomfort Pain Intervention(s): Limited activity within patient's tolerance, Monitored during session, Premedicated before session, Repositioned     Extremity/Trunk Assessment Upper Extremity Assessment Upper Extremity Assessment: Overall WFL for tasks assessed   Lower Extremity Assessment Lower Extremity Assessment: Generalized weakness       Communication Communication Communication: No apparent difficulties   Cognition Arousal: Alert Behavior During Therapy: WFL for tasks assessed/performed, Anxious                                 Following commands: Intact       Cueing  General  Comments   Cueing Techniques: Verbal cues              Home Living Family/patient expects to be discharged to:: Private residence Living Arrangements: Spouse/significant other Available Help at Discharge: Family;Available 24 hours/day Type of Home: Independent living facility Home Access: Level entry     Home Layout: One level     Bathroom Shower/Tub: Walk-in shower         Home Equipment: Agricultural consultant (2 wheels);Rollator (4 wheels)          Prior Functioning/Environment Prior Level of Function : Independent/Modified Independent;Driving             Mobility Comments:  not using AD prior to admission, drives, exercises, walks dogs. ADLs Comments: independent and very active    OT Problem List: Decreased strength;Decreased activity tolerance;Decreased safety awareness;Impaired balance (sitting and/or standing);Decreased knowledge of use of DME or AE   OT Treatment/Interventions: Self-care/ADL training;Therapeutic exercise;Therapeutic activities;Energy conservation;DME and/or AE instruction;Patient/family education;Balance training      OT Goals(Current goals can be found in the care plan section)   Acute Rehab OT Goals Patient Stated Goal: to go to rehab OT Goal Formulation: With patient Time For Goal Achievement: 02/12/24 Potential to Achieve Goals: Fair ADL Goals Pt Will Perform Grooming: with modified independence;standing Pt Will Perform Lower Body Dressing: with modified independence;sit to/from stand Pt Will Transfer to Toilet: with modified independence;ambulating Pt Will Perform Toileting - Clothing Manipulation and hygiene: with modified independence;sit to/from stand   OT Frequency:  Min 2X/week       AM-PAC OT "6 Clicks" Daily Activity     Outcome Measure Help from another person eating meals?: None Help from another person taking care of personal grooming?: None Help from another person toileting, which includes using toliet, bedpan, or urinal?: A Little Help from another person bathing (including washing, rinsing, drying)?: A Little Help from another person to put on and taking off regular upper body clothing?: A Little Help from another person to put on and taking off regular lower body clothing?: A Little 6 Click Score: 20   End of Session Nurse Communication: Mobility status;Other (comment) (on bedpan)  Activity Tolerance: Patient limited by pain Patient left: in bed;with call bell/phone within reach;with bed alarm set  OT Visit Diagnosis: Unsteadiness on feet (R26.81);Repeated falls (R29.6);Muscle weakness (generalized)  (M62.81)                Time: 1610-9604 OT Time Calculation (min): 15 min Charges:  OT General Charges $OT Visit: 1 Visit OT Evaluation $OT Eval Low Complexity: 1 Low OT Treatments $Self Care/Home Management : 8-22 mins  George Kinder, MS, OTR/L , CBIS ascom [phone]  01/29/24, 2:29 PM

## 2024-01-29 NOTE — Progress Notes (Addendum)
 Progress Note   Patient: Melanie Browning UEA:[id number] DOB: [birth year] DOA: 01/20/2024     9 DOS: the patient was seen and examined on 01/29/2024   Brief hospital course: 83 y.o. 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 Patient had a left lower extremity angioplasty and stent placement on 4/23.  Postoperatively, patient was transferred to ICU due to new onset atrial fibrillation with IVR, patient also with acute blood loss anemia, required multiple units of blood transfusion.  Also had urinary retension, Foley catheter was anchored by urology.   Principal Problem:   Atherosclerosis of artery of extremity with rest pain [facility]) Active Problems:   Atrial fibrillation (HCC)   Hypotension   Non-ST elevation (NSTEMI) myocardial infarction (HCC)   Blood loss anemia   Elevated troponin   Assessment and Plan:  New a fib with RVR now rate controlled with STT changes NSTEMI -- history of coronary artery disease with prior stent placement to LAD in 2022 -- converted to sinus rhythm with amiodarone  infusion --  IV heparin  drip--> apixaban , brilinta  -- outpatient workup for chest pain. Patient currently is chest pain free, Could not tolerate statin.    Severe peripheral arterial disease lower extremity status post angioplasty and stent placement. post procedure groin hematoma Acute blood loss anemia. Patient's status post peripheral arterial angioplasty and stent placement.  Postoperatively, patient developed acute blood loss anemia and right groin hematoma. --pt s/p 4 unit BT  per vascular sx  Mild iron  deficiency, start oral iron .  Copper level elevated. Hemoglobin dropped down to 7.6 today, continue to monitor, transfuse PRBC, goal of 8.0 due to heart disease.   Hyponatremia Hypokalemia Hypomagnesemia. Sodium level still low, received IV fluid  yesterday.  Added salt tablets.  Also started fluid restriction. Recheck a BMP tomorrow.   Hyperlipidemia -- patient clarifies she was taken off a statin in the past, didn't refuse it - will resume atorvastatin  40   Urinary retention New on 4/29, urology placed foley, advises maintain for a week, then TOV   Hypothyroid TSH 8.09, increase Synthroid  to 100 mcg daily.          Subjective:  Patient doing well, had a small loose stools after giving stool softener.  Physical Exam: Vitals:   01/28/24 2132 01/29/24 0024 01/29/24 0444 01/29/24 0817  BP: 118/66 120/64 (!) 131/56 (!) 131/56  Pulse: 84 81 80 76  Resp: 20 19 18 18   Temp: 99.2 F (37.3 C) 98 F (36.7 C) 98.5 F (36.9 C) 98.9 F (37.2 C)  TempSrc:    Oral  SpO2: 98% 99% 97% 99%  Weight:      Height:       General exam: Appears calm and comfortable  Respiratory system: Clear to auscultation. Respiratory effort normal. Cardiovascular system: S1 & S2 heard, RRR. No JVD, murmurs, rubs, gallops or clicks. No pedal edema. Gastrointestinal system: Abdomen is nondistended, soft and nontender. No organomegaly or masses felt. Normal bowel sounds heard. Central nervous system: Alert and oriented x3. No focal neurological deficits. Extremities: Symmetric 5 x 5 power. Skin: No rashes, lesions or ulcers Psychiatry: Judgement and insight appear normal. Mood & affect appropriate.    Data Reviewed:  Lab results reviewed.  Family Communication: Husband updated at bedside  Disposition: Status is: Inpatient Remains inpatient appropriate because: Severity of disease, IV treatment.     Time spent: 35 minutes  Author: Donaciano Frizzle, MD  01/29/2024 10:48 AM  For on call review [email or web address].

## 2024-01-29 NOTE — Progress Notes (Signed)
 Physical Therapy Treatment Patient Details Name: Melanie Browning MRN: [id number] DOB: [birth year] Today's Date: 01/29/2024   History of Present Illness Melanie Browning is an 83yoF comes to [facility] 4/22 for LLE angiography, thrombectomy, and stent placement for limb salvage. PMH: PVD with critical limb ischemia, CAD s/p PCI to LAD 10/2020, HTN. Post procedure pt developed AF c RVR, hypotension, noted ABLA Hb down to 5.9. Pt required CCU transfer. At baseline pt very active, exercises regularly, lives at [facility] with husband.    PT Comments  Author returns for 2nd PT session. Pt partakes in bed mobility and tranfers training. This is the first day pt has been able to sit bed side or stand in several days. Pt remains calm and focused, no pain of symptoms limitations. Legs remain remarkable weak but pt able to show improvement within session. Pt left at EOB, husband at bedside, all needs met.    If plan is discharge home, recommend the following: A little help with walking and/or transfers;A little help with bathing/dressing/bathroom;Assistance with cooking/housework;Assist for transportation;Help with stairs or ramp for entrance   Can travel by private vehicle     Yes  Equipment Recommendations  BSC/3in1;Rolling walker (2 wheels)    Recommendations for Other Services       Precautions / Restrictions Precautions Precautions: Fall Recall of Precautions/Restrictions: Intact Restrictions Weight Bearing Restrictions Per Provider Order: No     Mobility  Bed Mobility   Bed Mobility: Supine to Sit     Supine to sit: HOB elevated, Used rails (no assist needed to perform, heavy effort requirted)          Transfers Overall transfer level: Needs assistance Equipment used: Rolling walker (2 wheels) Transfers: Sit to/from Stand Sit to Stand: Max assist           General transfer comment: hips extensors remain weak, ankle DF pain makes bringing feet under COM restricted     Ambulation/Gait Ambulation/Gait assistance:  (unable to attempt this date, but otherwise progressing in other areas)           Pre-gait activities: seated marching at EOB x10, standing marching c BUE support on RW x12     Stairs             Wheelchair Mobility     Tilt Bed    Modified Rankin (Stroke Patients Only)       Balance                                            Communication    Cognition Arousal: Alert Behavior During Therapy: WFL for tasks assessed/performed, Anxious   PT - Cognitive impairments: No apparent impairments                         Following commands: Intact      Cueing    Exercises Other Exercises Other Exercises: STS from very elevated EOB to RW, min A to trunk while orientation to gravity is achieved (less help needed with repeated efforts) 3x15sec standing Other Exercises: seated EOB, marching in place x10 Other Exercises: standing at RW marching in place x12 (very weak, labored, but steady) ?foot drop Other Exercises: STS from EOB, pulling self up using STEDY, twice for demonstration. Other Exercises: remains seated EOB at end of session to visit with husband    General Comments  Pertinent Vitals/Pain Pain Assessment Pain Assessment: No/denies pain (some ABD discomfort, some transient tightness pain with ankles and hips at end of tolerated rnges.) Pain Intervention(s): Limited activity within patient's tolerance, Monitored during session, Premedicated before session, Repositioned    Home Living                          Prior Function            PT Goals (current goals can now be found in the care plan section) Acute Rehab PT Goals Patient Stated Goal: to get stronger PT Goal Formulation: With patient Time For Goal Achievement: 02/06/24 Potential to Achieve Goals: Good Progress towards PT goals: Progressing toward goals    Frequency    Min 3X/week      PT  Plan      Co-evaluation              AM-PAC PT "6 Clicks" Mobility   Outcome Measure  Help needed turning from your back to your side while in a flat bed without using bedrails?: A Lot Help needed moving from lying on your back to sitting on the side of a flat bed without using bedrails?: A Lot Help needed moving to and from a bed to a chair (including a wheelchair)?: A Lot Help needed standing up from a chair using your arms (e.g., wheelchair or bedside chair)?: A Lot Help needed to walk in hospital room?: A Lot Help needed climbing 3-5 steps with a railing? : A Lot 6 Click Score: 12    End of Session   Activity Tolerance: Patient tolerated treatment well;Patient limited by fatigue Patient left: in bed;with nursing/sitter in room;with call bell/phone within reach Nurse Communication: Mobility status PT Visit Diagnosis: Unsteadiness on feet (R26.81);Muscle weakness (generalized) (M62.81);Other abnormalities of gait and mobility (R26.89)     Time: 1610-9604 PT Time Calculation (min) (ACUTE ONLY): 21 min  Charges:    $Therapeutic Exercise: 8-22 mins $Therapeutic Activity: 8-22 mins PT General Charges $$ ACUTE PT VISIT: 1 Visit                    10:50 AM, 01/29/24 Dawn Eth, PT, DPT Physical Therapist - [facility]  [phone] (ASCOM)     Lovett Coffin C 01/29/2024, 10:47 AM

## 2024-01-30 ENCOUNTER — Inpatient Hospital Stay

## 2024-01-30 DIAGNOSIS — I214 Non-ST elevation (NSTEMI) myocardial infarction: Secondary | ICD-10-CM | POA: Diagnosis not present

## 2024-01-30 DIAGNOSIS — I70229 Atherosclerosis of native arteries of extremities with rest pain, unspecified extremity: Secondary | ICD-10-CM | POA: Diagnosis not present

## 2024-01-30 DIAGNOSIS — D500 Iron deficiency anemia secondary to blood loss (chronic): Secondary | ICD-10-CM | POA: Diagnosis not present

## 2024-01-30 LAB — BASIC METABOLIC PANEL WITH GFR
Anion gap: 7 (ref 5–15)
BUN: 25 mg/dL — ABNORMAL HIGH (ref 8–23)
CO2: 24 mmol/L (ref 22–32)
Calcium: 8 mg/dL — ABNORMAL LOW (ref 8.9–10.3)
Chloride: 99 mmol/L (ref 98–111)
Creatinine, Ser: 0.73 mg/dL (ref 0.44–1.00)
GFR, Estimated: >60 mL/min (ref >60)
Glucose, Bld: 100 mg/dL — ABNORMAL HIGH (ref 70–99)
Potassium: 3.4 mmol/L — ABNORMAL LOW (ref 3.5–5.1)
Sodium: 130 mmol/L — ABNORMAL LOW (ref 135–145)

## 2024-01-30 LAB — TYPE AND SCREEN
ABO/RH(D): A POS
Antibody Screen: NEGATIVE
Unit division: 0

## 2024-01-30 LAB — CBC
HCT: 26.1 % — ABNORMAL LOW (ref 36.0–46.0)
Hemoglobin: 8.7 g/dL — ABNORMAL LOW (ref 12.0–15.0)
MCH: 30.1 pg (ref 26.0–34.0)
MCHC: 33.3 g/dL (ref 30.0–36.0)
MCV: 90.3 fL (ref 80.0–100.0)
Platelets: 303 10*3/uL (ref 150–400)
RBC: 2.89 MIL/uL — ABNORMAL LOW (ref 3.87–5.11)
RDW: 16.4 % — ABNORMAL HIGH (ref 11.5–15.5)
WBC: 8.3 10*3/uL (ref 4.0–10.5)
nRBC: 0 % (ref 0.0–0.2)

## 2024-01-30 LAB — BPAM RBC
Blood Product Expiration Date: 202506042359
ISSUE DATE / TIME: 202505012147
Unit Type and Rh: 6200

## 2024-01-30 LAB — MAGNESIUM: Magnesium: 1.8 mg/dL (ref 1.7–2.4)

## 2024-01-30 MED ORDER — POTASSIUM CHLORIDE CRYS ER 20 MEQ PO TBCR
40.0000 meq | EXTENDED_RELEASE_TABLET | ORAL | Status: AC
  Administered 2024-01-30 (×2): 40 meq via ORAL
  Filled 2024-01-30 (×2): qty 2

## 2024-01-30 MED ORDER — FUROSEMIDE 10 MG/ML IJ SOLN
40.0000 mg | Freq: Once | INTRAMUSCULAR | Status: AC
  Administered 2024-01-30: 40 mg via INTRAVENOUS
  Filled 2024-01-30: qty 4

## 2024-01-30 NOTE — Progress Notes (Signed)
 Progress Note   Patient: Melanie Browning WUX:[id number] DOB: 1941-05-25 DOA: 01/20/2024     10 DOS: the patient was seen and examined on 01/30/2024   Brief hospital course: 83 y.o. 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 Patient had a left lower extremity angioplasty and stent placement on 4/23.  Postoperatively, patient was transferred to ICU due to new onset atrial fibrillation with IVR, patient also with acute blood loss anemia, required multiple units of blood transfusion.  Also had urinary retension, Foley catheter was anchored by urology.   Principal Problem:   Atherosclerosis of artery of extremity with rest pain [facility]) Active Problems:   Atrial fibrillation (HCC)   Hypotension   Non-ST elevation (NSTEMI) myocardial infarction (HCC)   Blood loss anemia   Elevated troponin   Intestinal occlusion (HCC)   Assessment and Plan:  New a fib with RVR now rate controlled  NSTEMI -- history of coronary artery disease with prior stent placement to LAD in 2022 -- converted to sinus rhythm with amiodarone  infusion --  IV heparin  drip--> apixaban , brilinta  -- outpatient workup per cardiology consult. Patient currently is chest pain free, Could not tolerate statin.     Severe peripheral arterial disease lower extremity status post angioplasty and stent placement. post procedure groin hematoma Acute blood loss anemia. Patient's status post peripheral arterial angioplasty and stent placement.  Postoperatively, patient developed acute blood loss anemia and right groin hematoma. --pt s/p 4 unit BT  per vascular sx  Mild iron  deficiency, start oral iron .  Copper level elevated. 5/1. Hemoglobin dropped down to 7.6 today, continue to monitor, transfuse PRBC, goal of 8.0 due to heart disease.  Also received IV iron .  Hemoglobin today is 8.7/    Hyponatremia Hypokalemia Hypomagnesemia. Mild metabolic acidosis resolved. Sodium level still low, received IV fluid yesterday.  Added salt tablets.  Also started fluid restriction. Today 130, potassium 3.4, give additional dose of potassium chloride  80 mEq orally.   Hyperlipidemia -- patient clarifies she was taken off a statin in the past, didn't refuse it - will resume atorvastatin  40   Urinary retention New on 4/29, urology placed foley, advises maintain for a week, then TOV   Hypothyroid TSH 8.09, increased Synthroid  to 100 mcg daily.  Chronic ileus. Patient has diffuse bowel dilatation KUB, reviewed prior record, this appears to be chronic.  Patient has been having daily bowel movements.      Subjective:  Patient doing better today, abdominal cramping pain seem to be better.  No nausea vomiting.  Tolerating diet.  Physical Exam: Vitals:   01/29/24 2218 01/30/24 0033 01/30/24 0534 01/30/24 0748  BP: (!) 110/52 (!) 122/58 (!) 126/58 (!) 122/54  Pulse: 75 77 74 78  Resp: 16 16 18 18   Temp: 98.5 F (36.9 C) 98.4 F (36.9 C) 98.3 F (36.8 C) 98.4 F (36.9 C)  TempSrc:    Oral  SpO2: 97% 99% 97% 96%  Weight:      Height:       General exam: Appears calm and comfortable  Respiratory system: Clear to auscultation. Respiratory effort normal. Cardiovascular system: S1 & S2 heard, RRR. No JVD, murmurs, rubs, gallops or clicks. No pedal edema. Gastrointestinal system: Abdomen is nondistended, soft and nontender. No organomegaly or masses felt. Normal bowel sounds heard. Central nervous system: Alert and oriented x3. No focal neurological deficits. Extremities: Symmetric 5 x 5 power. Skin: No  rashes, lesions or ulcers Psychiatry: Judgement and insight appear normal. Mood & affect appropriate.    Data Reviewed:  KUB and lab results reviewed.  Prior CT scan reviewed, prior x-ray reviewed.  Family Communication: None  Disposition: Status is: Inpatient Remains  inpatient appropriate because: Severity of disease.     Time spent: 35 minutes  Author: Donaciano Frizzle, MD 01/30/2024 10:12 AM  For on call review [email or web address].

## 2024-01-30 NOTE — Progress Notes (Signed)
 Physical Therapy Treatment Patient Details Name: Melanie Browning MRN: [id number] DOB: Nov 25, 1940 Today's Date: 01/30/2024   History of Present Illness Melanie Browning is an 83yoF comes to [facility] 4/22 for LLE angiography, thrombectomy, and stent placement for limb salvage. PMH: PVD with critical limb ischemia, CAD s/p PCI to LAD 10/2020, HTN. Post procedure pt developed AF c RVR, hypotension, noted ABLA Hb down to 5.9. Pt required CCU transfer. At baseline pt very active, exercises regularly, lives at [facility] with husband.    PT Comments  Pt in bed waiting for meal tray, agreeable to session. Pt able to demonstrate dramatic improvement in transfers strength, then advances to sidestepping along counter which goes so well, she advances to 34ft AMB in room with RW. Pt has not walked since admission 4/22. Pt left EOB with meal presented, will continue to follow.    If plan is discharge home, recommend the following: A little help with walking and/or transfers;A little help with bathing/dressing/bathroom;Assistance with cooking/housework;Assist for transportation;Help with stairs or ramp for entrance   Can travel by private vehicle        Equipment Recommendations  BSC/3in1;Rolling walker (2 wheels)    Recommendations for Other Services       Precautions / Restrictions Precautions Precautions: Fall Recall of Precautions/Restrictions: Intact Restrictions Weight Bearing Restrictions Per Provider Order: No     Mobility  Bed Mobility Overal bed mobility: Needs Assistance Bed Mobility: Supine to Sit     Supine to sit: HOB elevated, Used rails, Supervision          Transfers Overall transfer level: Needs assistance Equipment used: Rolling walker (2 wheels) Transfers: Sit to/from Stand Sit to Stand: Min assist, From elevated surface                Ambulation/Gait Ambulation/Gait assistance: Contact guard assist Gait Distance (Feet): 80 Feet Assistive device: Rolling walker (2  wheels) Gait Pattern/deviations: Step-through pattern     Pre-gait activities: lateral side stepping along countertop, twice bilat; then forward along countertop 4x 1-2 hand support General Gait Details: first time AMB since admission   Stairs             Wheelchair Mobility     Tilt Bed    Modified Rankin (Stroke Patients Only)       Balance                                            Communication    Cognition                                        Cueing    Exercises Other Exercises Other Exercises: 10xSTS from elevated EOB, pulling up on sink/counter Other Exercises: side stepping along counter 2x bilat Other Exercises: forward AMB along counter x4    General Comments        Pertinent Vitals/Pain Pain Assessment Pain Assessment: No/denies pain    Home Living                          Prior Function            PT Goals (current goals can now be found in the care plan section) Acute Rehab PT Goals Patient Stated Goal: to get  stronger PT Goal Formulation: With patient Time For Goal Achievement: 02/06/24 Potential to Achieve Goals: Good Progress towards PT goals: Progressing toward goals    Frequency    Min 3X/week      PT Plan      Co-evaluation              AM-PAC PT "6 Clicks" Mobility   Outcome Measure  Help needed turning from your back to your side while in a flat bed without using bedrails?: A Little Help needed moving from lying on your back to sitting on the side of a flat bed without using bedrails?: A Little Help needed moving to and from a bed to a chair (including a wheelchair)?: A Lot Help needed standing up from a chair using your arms (e.g., wheelchair or bedside chair)?: A Lot Help needed to walk in hospital room?: A Little Help needed climbing 3-5 steps with a railing? : A Little 6 Click Score: 16    End of Session   Activity Tolerance: Patient tolerated  treatment well;Patient limited by fatigue Patient left: in bed;with nursing/sitter in room;with call bell/phone within reach (seated EOB twith meal tray presented) Nurse Communication: Mobility status PT Visit Diagnosis: Unsteadiness on feet (R26.81);Muscle weakness (generalized) (M62.81);Other abnormalities of gait and mobility (R26.89)     Time: 0102-7253 PT Time Calculation (min) (ACUTE ONLY): 31 min  Charges:    $Therapeutic Exercise: 8-22 mins $Therapeutic Activity: 8-22 mins PT General Charges $$ ACUTE PT VISIT: 1 Visit                    2:15 PM, 01/30/24 Dawn Eth, PT, DPT Physical Therapist - [facility]  [phone] (ASCOM)     Ilhan Debenedetto C 01/30/2024, 2:13 PM

## 2024-01-31 DIAGNOSIS — I214 Non-ST elevation (NSTEMI) myocardial infarction: Secondary | ICD-10-CM | POA: Diagnosis not present

## 2024-01-31 DIAGNOSIS — D500 Iron deficiency anemia secondary to blood loss (chronic): Secondary | ICD-10-CM | POA: Diagnosis not present

## 2024-01-31 DIAGNOSIS — I70229 Atherosclerosis of native arteries of extremities with rest pain, unspecified extremity: Secondary | ICD-10-CM | POA: Diagnosis not present

## 2024-01-31 LAB — URINALYSIS, COMPLETE (UACMP) WITH MICROSCOPIC
Bilirubin Urine: NEGATIVE
Glucose, UA: NEGATIVE mg/dL
Ketones, ur: NEGATIVE mg/dL
Nitrite: NEGATIVE
Protein, ur: 100 mg/dL — AB
RBC / HPF: >50 RBC/hpf (ref 0–5)
Specific Gravity, Urine: 1.014 (ref 1.005–1.030)
WBC, UA: >50 WBC/hpf (ref 0–5)
pH: 7 (ref 5.0–8.0)

## 2024-01-31 LAB — CBC
HCT: 25.6 % — ABNORMAL LOW (ref 36.0–46.0)
Hemoglobin: 8.5 g/dL — ABNORMAL LOW (ref 12.0–15.0)
MCH: 30.1 pg (ref 26.0–34.0)
MCHC: 33.2 g/dL (ref 30.0–36.0)
MCV: 90.8 fL (ref 80.0–100.0)
Platelets: 338 10*3/uL (ref 150–400)
RBC: 2.82 MIL/uL — ABNORMAL LOW (ref 3.87–5.11)
RDW: 16.8 % — ABNORMAL HIGH (ref 11.5–15.5)
WBC: 9.4 10*3/uL (ref 4.0–10.5)
nRBC: 0.2 % (ref 0.0–0.2)

## 2024-01-31 LAB — MAGNESIUM: Magnesium: 1.6 mg/dL — ABNORMAL LOW (ref 1.7–2.4)

## 2024-01-31 LAB — BASIC METABOLIC PANEL WITH GFR
Anion gap: 9 (ref 5–15)
BUN: 20 mg/dL (ref 8–23)
CO2: 22 mmol/L (ref 22–32)
Calcium: 8 mg/dL — ABNORMAL LOW (ref 8.9–10.3)
Chloride: 101 mmol/L (ref 98–111)
Creatinine, Ser: 0.58 mg/dL (ref 0.44–1.00)
GFR, Estimated: >60 mL/min (ref >60)
Glucose, Bld: 90 mg/dL (ref 70–99)
Potassium: 3.9 mmol/L (ref 3.5–5.1)
Sodium: 132 mmol/L — ABNORMAL LOW (ref 135–145)

## 2024-01-31 MED ORDER — MAGNESIUM SULFATE 2 GM/50ML IV SOLN
2.0000 g | Freq: Once | INTRAVENOUS | Status: AC
  Administered 2024-01-31: 2 g via INTRAVENOUS
  Filled 2024-01-31: qty 50

## 2024-01-31 MED ORDER — SIMETHICONE 80 MG PO CHEW: 80.0000 mg | CHEWABLE_TABLET | Freq: Four times a day (QID) | ORAL | Status: DC | PRN

## 2024-01-31 MED ORDER — SIMETHICONE 80 MG PO CHEW
160.0000 mg | CHEWABLE_TABLET | Freq: Once | ORAL | Status: AC
  Administered 2024-01-31: 160 mg via ORAL
  Filled 2024-01-31: qty 2

## 2024-01-31 NOTE — Plan of Care (Signed)

## 2024-01-31 NOTE — Progress Notes (Signed)
    Subjective  - POD #10  Had her rectal tube removed last night because of discomfort.  Says she feels a little better today with more energy.  She has been able to get out of bed to the chair.   Physical Exam:  Bilateral groins are clean and dry without new hematoma however there is a lot of ecchymosis in the right groin Palpable posterior tibial pulses bilaterally       Assessment/Plan:  POD #10  GI: Rectal tube was removed yesterday night secondary to pain and discomfort.  Her abdomen is soft.  GU: Catheter will remain in place until Monday  Acute blood loss anemia: Hemoglobin is stable at 8.5  Activity: I spoke with the nurse to get her out of bed ambulating in the hallway today.  Wells Crystalmarie Yasin 01/31/2024 1:27 PM --  Vitals:   01/31/24 1000 01/31/24 1103  BP:  122/69  Pulse: (!) 105 72  Resp:  18  Temp:    SpO2:  99%    Intake/Output Summary (Last 24 hours) at 01/31/2024 1327 Last data filed at 01/31/2024 0700 Gross per 24 hour  Intake 370 ml  Output 1650 ml  Net -1280 ml     Laboratory CBC    Component Value Date/Time   WBC 9.4 01/31/2024 0406   HGB 8.5 (L) 01/31/2024 0406   HGB 13.2 06/17/2023 0931   HCT 25.6 (L) 01/31/2024 0406   HCT 39.8 06/17/2023 0931   PLT 338 01/31/2024 0406   PLT 181 06/17/2023 0931    BMET    Component Value Date/Time   NA 132 (L) 01/31/2024 0406   K 3.9 01/31/2024 0406   CL 101 01/31/2024 0406   CO2 22 01/31/2024 0406   GLUCOSE 90 01/31/2024 0406   BUN 20 01/31/2024 0406   CREATININE 0.58 01/31/2024 0406   CALCIUM  8.0 (L) 01/31/2024 0406   GFRNONAA >60 01/31/2024 0406    COAG No results found for: "INR", "PROTIME" No results found for: "PTT"  Antibiotics Anti-infectives (From admission, onward)    Start     Dose/Rate Route Frequency Ordered Stop   01/20/24 1313  ceFAZolin  (ANCEF ) IVPB 2g/100 mL premix        2 g 200 mL/hr over 30 Minutes Intravenous 30 min pre-op 01/20/24 1313 01/20/24 1550         V. Marti Slates, M.D., [facility] [place] Office: [phone] Pager:  [phone]

## 2024-01-31 NOTE — Progress Notes (Addendum)
   01/31/24 8295  Provider Notification  Provider Name/Title Dr. Elisabeth Guild, APRN  Date Provider Notified 01/31/24  Time Provider Notified [phone]  Method of Notification Page  Notification Reason Requested by patient/family (This Pt requests med for bloating. She is here for LLE angiogram with stent, afib-RVR, now NSR, on Amiodarone  PO, stable hemodynamically.)  Provider response See new order in [facility] for simethicone 160 mg chewable tab once.    Brain Cahill, RN

## 2024-01-31 NOTE — Progress Notes (Signed)
 Physical Therapy Treatment Patient Details Name: Melanie Browning MRN: [id number] DOB: 02-05-1941 Today's Date: 01/31/2024   History of Present Illness Melanie Browning is an 83yoF comes to [facility] 4/22 for LLE angiography, thrombectomy, and stent placement for limb salvage. PMH: PVD with critical limb ischemia, CAD s/p PCI to LAD 10/2020, HTN. Post procedure pt developed AF c RVR, hypotension, noted ABLA Hb down to 5.9. Pt required CCU transfer. At baseline pt very active, exercises regularly, lives at [facility] with husband.    PT Comments  Pt received in bed, anxious to get up and walk. Discussed role of PT, benefits of mobility, remaining functional PT goals. Pt much improved this date after prolonged hospital stay. She is able to transfer to EOB with light use of bed rail, no physical assist. Sit<>stand with good technique and CGA. Increased gait distance with RW in hallway with light CGA, twice around nursing station. Slow cadence with c/o Left inner thigh rubbing due to swelling - placed mesh underwear in room for next mobility session. Minimal fatigue post mobility, overall excellent progress and tolerance. Pt stating she would like to return home with HHPT services at d/c, she does not have any DME needs. Will continue to progress acutely.    If plan is discharge home, recommend the following: A little help with walking and/or transfers;A little help with bathing/dressing/bathroom;Assistance with cooking/housework;Assist for transportation;Help with stairs or ramp for entrance   Can travel by private vehicle     Yes  Equipment Recommendations  None recommended by PT;Other (comment) (Pt has every DME possible at home)    Recommendations for Other Services       Precautions / Restrictions Precautions Precautions: Fall Recall of Precautions/Restrictions: Intact     Mobility  Bed Mobility Overal bed mobility: Needs Assistance Bed Mobility: Supine to Sit Rolling: Supervision, Used rails   Supine  to sit: HOB elevated, Used rails, Supervision     General bed mobility comments: Improved ability to transfer to EOB    Transfers Overall transfer level: Needs assistance Equipment used: Rolling walker (2 wheels) Transfers: Sit to/from Stand Sit to Stand: Contact guard assist           General transfer comment: Cues for hand placement and technique    Ambulation/Gait Ambulation/Gait assistance: Contact guard assist Gait Distance (Feet): 350 Feet Assistive device: Rolling walker (2 wheels) Gait Pattern/deviations: Step-through pattern Gait velocity: decr     General Gait Details: Significant improved tolerance for mobility, pt prefers to have RW higher than recommended   Stairs             Wheelchair Mobility     Tilt Bed    Modified Rankin (Stroke Patients Only)       Balance Overall balance assessment: Needs assistance Sitting-balance support: No upper extremity supported, Feet supported Sitting balance-Leahy Scale: Good     Standing balance support: Bilateral upper extremity supported, Reliant on assistive device for balance Standing balance-Leahy Scale: Fair Standing balance comment: Light CGA for gait training with RW                            Communication Communication Communication: No apparent difficulties  Cognition Arousal: Alert Behavior During Therapy: WFL for tasks assessed/performed   PT - Cognitive impairments: No apparent impairments                       PT - Cognition Comments: Pleasant and motivated  Following commands: Intact      Cueing Cueing Techniques: Verbal cues  Exercises      General Comments General comments (skin integrity, edema, etc.): Left inner thigh remains slightly edematous      Pertinent Vitals/Pain Pain Assessment Pain Assessment: No/denies pain    Home Living                          Prior Function            PT Goals (current goals can now be found in the  care plan section) Acute Rehab PT Goals Patient Stated Goal: to get stronger Progress towards PT goals: Progressing toward goals    Frequency    Min 3X/week      PT Plan      Co-evaluation              AM-PAC PT "6 Clicks" Mobility   Outcome Measure  Help needed turning from your back to your side while in a flat bed without using bedrails?: A Little Help needed moving from lying on your back to sitting on the side of a flat bed without using bedrails?: A Little Help needed moving to and from a bed to a chair (including a wheelchair)?: A Little Help needed standing up from a chair using your arms (e.g., wheelchair or bedside chair)?: A Little Help needed to walk in hospital room?: A Little Help needed climbing 3-5 steps with a railing? : A Little 6 Click Score: 18    End of Session Equipment Utilized During Treatment: Gait belt Activity Tolerance: Patient tolerated treatment well Patient left: in chair;with call bell/phone within reach Nurse Communication: Mobility status PT Visit Diagnosis: Unsteadiness on feet (R26.81);Muscle weakness (generalized) (M62.81);Other abnormalities of gait and mobility (R26.89)     Time: 0981-1914 PT Time Calculation (min) (ACUTE ONLY): 25 min  Charges:    $Gait Training: 8-22 mins $Therapeutic Activity: 8-22 mins PT General Charges $$ ACUTE PT VISIT: 1 Visit                    Melvyn Stagers, PTA  [name] 01/31/2024, 5:20 PM

## 2024-01-31 NOTE — Progress Notes (Signed)
 Progress Note   Patient: Melanie Browning UJW:[id number] DOB: August 18, 1941 DOA: 01/20/2024     11 DOS: the patient was seen and examined on 01/31/2024   Brief hospital course: 83 y.o. 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 Patient had a left lower extremity angioplasty and stent placement on 4/23.  Postoperatively, patient was transferred to ICU due to new onset atrial fibrillation with IVR, patient also with acute blood loss anemia, required multiple units of blood transfusion.  Also had urinary retension, Foley catheter was anchored by urology.   Principal Problem:   Atherosclerosis of artery of extremity with rest pain [facility]) Active Problems:   Atrial fibrillation (HCC)   Hypotension   Non-ST elevation (NSTEMI) myocardial infarction (HCC)   Blood loss anemia   Elevated troponin   Intestinal occlusion (HCC)   Assessment and Plan: New a fib with RVR now rate controlled  NSTEMI -- history of coronary artery disease with prior stent placement to LAD in 2022 -- converted to sinus rhythm with amiodarone  infusion --  IV heparin  drip--> apixaban , brilinta  -- outpatient workup per cardiology consult. Patient currently is chest pain free, Could not tolerate statin. No new issues.     Severe peripheral arterial disease lower extremity status post angioplasty and stent placement. post procedure groin hematoma Acute blood loss anemia. Patient's status post peripheral arterial angioplasty and stent placement.  Postoperatively, patient developed acute blood loss anemia and right groin hematoma. --pt s/p 4 unit BT  per vascular sx  Mild iron  deficiency, start oral iron .  Copper level elevated. 5/1. Hemoglobin dropped down to 7.6 today, continue to monitor, transfuse PRBC, goal of 8.0 due to heart disease.  Also received IV iron .   Hemoglobin has been  stabilized.   Hyponatremia Hypokalemia Hypomagnesemia. Mild metabolic acidosis resolved. Sodium level still low, received IV fluid yesterday.  Added salt tablets.  Also started fluid restriction. Sodium level improving at 132 today, given 2 g of magnesium  sulfate.   Hyperlipidemia Refused statin, could not convince her to take it.   Urinary retention New on 4/29, urology placed foley, advises maintain for a week, then TOV   Hypothyroid TSH 8.09, increased Synthroid  to 100 mcg daily.   Chronic ileus. Patient has diffuse bowel dilatation KUB, reviewed prior record, this appears to be chronic.  Patient has been having daily bowel movements.        Subjective:  Patient doing well today.  Continues have daily bowel movements.  No nausea vomiting.  Tolerating diet better today.  Physical Exam: Vitals:   01/31/24 0700 01/31/24 0754 01/31/24 0959 01/31/24 1000  BP: 132/76 133/68 120/85   Pulse: 71 70 (!) 135 (!) 105  Resp: 18 18 20    Temp: 98.1 F (36.7 C)  97.8 F (36.6 C)   TempSrc: Oral     SpO2: 99% 98% 98%   Weight:      Height:       General exam: Appears calm and comfortable  Respiratory system: Clear to auscultation. Respiratory effort normal. Cardiovascular system: S1 & S2 heard, RRR. No JVD, murmurs, rubs, gallops or clicks. No pedal edema. Gastrointestinal system: Abdomen is nondistended, soft and nontender. No organomegaly or masses felt. Normal bowel sounds heard. Central nervous system: Alert and oriented. No focal neurological deficits. Extremities: Symmetric 5 x 5 power. Skin: No rashes, lesions or ulcers Psychiatry: Judgement and insight appear normal. Mood & affect appropriate.  Data Reviewed:  Lab results reviewed.  Family Communication: None  Disposition: Status is: Inpatient Remains inpatient appropriate because: Per primary team.     Time spent: 35 minutes  Author: Donaciano Frizzle, MD 01/31/2024 10:10 AM  For on call review  [email or web address].

## 2024-02-01 DIAGNOSIS — I214 Non-ST elevation (NSTEMI) myocardial infarction: Secondary | ICD-10-CM | POA: Diagnosis not present

## 2024-02-01 DIAGNOSIS — I70229 Atherosclerosis of native arteries of extremities with rest pain, unspecified extremity: Secondary | ICD-10-CM | POA: Diagnosis not present

## 2024-02-01 DIAGNOSIS — I480 Paroxysmal atrial fibrillation: Secondary | ICD-10-CM | POA: Diagnosis not present

## 2024-02-01 MED ORDER — SULFAMETHOXAZOLE-TRIMETHOPRIM 800-160 MG PO TABS
1.0000 | ORAL_TABLET | Freq: Two times a day (BID) | ORAL | Status: DC
  Administered 2024-02-01 – 2024-02-02 (×3): 1 via ORAL
  Filled 2024-02-01 (×3): qty 1

## 2024-02-01 NOTE — Progress Notes (Signed)
 Per Dr Jeane Miguel, dc tele monitoring order

## 2024-02-01 NOTE — Progress Notes (Signed)
 Progress Note   Patient: Melanie Browning ZOX:[id number] DOB: 1941/04/12 DOA: 01/20/2024     12 DOS: the patient was seen and examined on 02/01/2024   Brief hospital course: 83 y.o. 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 Patient had a left lower extremity angioplasty and stent placement on 4/23.  Postoperatively, patient was transferred to ICU due to new onset atrial fibrillation with IVR, patient also with acute blood loss anemia, required multiple units of blood transfusion.  Also had urinary retension, Foley catheter was anchored by urology.   Principal Problem:   Atherosclerosis of artery of extremity with rest pain [facility]) Active Problems:   Atrial fibrillation (HCC)   Hypotension   Non-ST elevation (NSTEMI) myocardial infarction (HCC)   Blood loss anemia   Elevated troponin   Intestinal occlusion (HCC)   Assessment and Plan: New a fib with RVR now rate controlled  NSTEMI -- history of coronary artery disease with prior stent placement to LAD in 2022 -- converted to sinus rhythm with amiodarone  infusion --  IV heparin  drip--> apixaban , brilinta  -- outpatient workup per cardiology consult. Patient currently is chest pain free, Could not tolerate statin. No new issues.  Currently in sinus.     Severe peripheral arterial disease lower extremity status post angioplasty and stent placement. post procedure groin hematoma Acute blood loss anemia. Patient's status post peripheral arterial angioplasty and stent placement.  Postoperatively, patient developed acute blood loss anemia and right groin hematoma. --pt s/p 4 unit BT  per vascular sx  Mild iron  deficiency, start oral iron .  Copper level elevated. 5/1. Hemoglobin dropped down to 7.6 today, continue to monitor, transfuse PRBC, goal of 8.0 due to heart disease.  Also received IV  iron . Hemoglobin has been stable, no additional bleeding.  Recheck CBC tomorrow.   Hyponatremia Hypokalemia Hypomagnesemia. Mild metabolic acidosis resolved. Sodium level still low, received IV fluid yesterday.  Added salt tablets.  Also started fluid restriction. Sodium level improving at 132 today, given 2 g of magnesium  sulfate. Recheck labs tomorrow.  Hyperlipidemia Refused statin, could not convince her to take it.   Urinary retention New on 4/29, urology placed foley, advises maintain for a week, then TOV   Hypothyroid TSH 8.09, increased Synthroid  to 100 mcg daily.   Chronic ileus. Patient has diffuse bowel dilatation KUB, reviewed prior record, this appears to be chronic.  Patient has been having daily bowel movements.           Subjective:  Patient doing well today, has daily bowel movements.  No nausea vomiting.  Physical Exam: Vitals:   01/31/24 1838 01/31/24 2053 02/01/24 0509 02/01/24 0734  BP: (!) 140/68 138/67 139/68 139/73  Pulse: 76 72 63 72  Resp: 16 16 16 16   Temp: 98.6 F (37 C) 98.4 F (36.9 C) 98.4 F (36.9 C) 98.4 F (36.9 C)  TempSrc:      SpO2: 99% 98% 99% 98%  Weight:      Height:       General exam: Appears calm and comfortable  Respiratory system: Clear to auscultation. Respiratory effort normal. Cardiovascular system: S1 & S2 heard, RRR. No JVD, murmurs, rubs, gallops or clicks. No pedal edema. Gastrointestinal system: Abdomen is nondistended, soft and nontender. No organomegaly or masses felt. Normal bowel sounds heard. Central nervous system: Alert and oriented. No focal neurological deficits. Extremities: Symmetric 5 x 5 power. Skin: No rashes, lesions  or ulcers Psychiatry: Judgement and insight appear normal. Mood & affect appropriate.    Data Reviewed:  Lab results reviewed.  Family Communication: None  Disposition: Status is: Inpatient Remains inpatient appropriate because: Per primary team.     Time spent: 35  minutes  Author: Donaciano Frizzle, MD 02/01/2024 11:04 AM  For on call review [email or web address].

## 2024-02-01 NOTE — Plan of Care (Signed)
  Problem: Education: Goal: Knowledge of General Education information will improve Description: Including pain rating scale, medication(s)/side effects and non-pharmacologic comfort measures Outcome: Progressing   Problem: Health Behavior/Discharge Planning: Goal: Ability to manage health-related needs will improve Outcome: Progressing   Problem: Clinical Measurements: Goal: Ability to maintain clinical measurements within normal limits will improve Outcome: Progressing Goal: Will remain free from infection Outcome: Progressing Goal: Diagnostic test results will improve Outcome: Progressing Goal: Respiratory complications will improve Outcome: Progressing Goal: Cardiovascular complication will be avoided Outcome: Progressing   Problem: Elimination: Goal: Will not experience complications related to bowel motility Outcome: Progressing Goal: Will not experience complications related to urinary retention Outcome: Progressing   Problem: Skin Integrity: Goal: Risk for impaired skin integrity will decrease Outcome: Progressing   Problem: Education: Goal: Understanding of CV disease, CV risk reduction, and recovery process will improve Outcome: Progressing Goal: Individualized Educational Video(s) Outcome: Progressing

## 2024-02-01 NOTE — Progress Notes (Signed)
    Subjective  - POD #11   Had her rectal tube removed last night because of discomfort.  Says she feels a little better today with more energy.  She has been able to get out of bed to the chair.  Walked around nurses station several times yesterday Wants to go home tomorrow   Physical Exam:  No new groin hematoma\palpable PT pulses       Assessment/Plan:  POD #11  GI: Rectal tube was removed   Her abdomen is soft.   GU: Catheter will remain in place until Monday.  UA positive, will start abx   Acute blood loss anemia: Hemoglobin is stable    Activity: continue to ambulate.  Wells Win Guajardo 02/01/2024 10:35 AM --  Vitals:   02/01/24 0509 02/01/24 0734  BP: 139/68 139/73  Pulse: 63 72  Resp: 16 16  Temp: 98.4 F (36.9 C) 98.4 F (36.9 C)  SpO2: 99% 98%    Intake/Output Summary (Last 24 hours) at 02/01/2024 1035 Last data filed at 02/01/2024 0900 Gross per 24 hour  Intake --  Output 1650 ml  Net -1650 ml     Laboratory CBC    Component Value Date/Time   WBC 9.4 01/31/2024 0406   HGB 8.5 (L) 01/31/2024 0406   HGB 13.2 06/17/2023 0931   HCT 25.6 (L) 01/31/2024 0406   HCT 39.8 06/17/2023 0931   PLT 338 01/31/2024 0406   PLT 181 06/17/2023 0931    BMET    Component Value Date/Time   NA 132 (L) 01/31/2024 0406   K 3.9 01/31/2024 0406   CL 101 01/31/2024 0406   CO2 22 01/31/2024 0406   GLUCOSE 90 01/31/2024 0406   BUN 20 01/31/2024 0406   CREATININE 0.58 01/31/2024 0406   CALCIUM  8.0 (L) 01/31/2024 0406   GFRNONAA >60 01/31/2024 0406    COAG No results found for: "INR", "PROTIME" No results found for: "PTT"  Antibiotics Anti-infectives (From admission, onward)    Start     Dose/Rate Route Frequency Ordered Stop   01/20/24 1313  ceFAZolin  (ANCEF ) IVPB 2g/100 mL premix        2 g 200 mL/hr over 30 Minutes Intravenous 30 min pre-op 01/20/24 1313 01/20/24 1550        V. Marti Slates, M.D., [facility]  [place] Office: [phone] Pager:  [phone]

## 2024-02-02 DIAGNOSIS — Z9889 Other specified postprocedural states: Secondary | ICD-10-CM | POA: Diagnosis not present

## 2024-02-02 DIAGNOSIS — I214 Non-ST elevation (NSTEMI) myocardial infarction: Secondary | ICD-10-CM | POA: Diagnosis not present

## 2024-02-02 DIAGNOSIS — I70229 Atherosclerosis of native arteries of extremities with rest pain, unspecified extremity: Secondary | ICD-10-CM | POA: Diagnosis not present

## 2024-02-02 DIAGNOSIS — I70222 Atherosclerosis of native arteries of extremities with rest pain, left leg: Secondary | ICD-10-CM | POA: Diagnosis not present

## 2024-02-02 DIAGNOSIS — I480 Paroxysmal atrial fibrillation: Secondary | ICD-10-CM | POA: Diagnosis not present

## 2024-02-02 LAB — MAGNESIUM: Magnesium: 1.7 mg/dL (ref 1.7–2.4)

## 2024-02-02 LAB — PHOSPHORUS: Phosphorus: 2.7 mg/dL (ref 2.5–4.6)

## 2024-02-02 LAB — BASIC METABOLIC PANEL WITH GFR
Anion gap: 9 (ref 5–15)
BUN: 15 mg/dL (ref 8–23)
CO2: 21 mmol/L — ABNORMAL LOW (ref 22–32)
Calcium: 8.5 mg/dL — ABNORMAL LOW (ref 8.9–10.3)
Chloride: 104 mmol/L (ref 98–111)
Creatinine, Ser: 0.65 mg/dL (ref 0.44–1.00)
GFR, Estimated: >60 mL/min (ref >60)
Glucose, Bld: 93 mg/dL (ref 70–99)
Potassium: 3.9 mmol/L (ref 3.5–5.1)
Sodium: 134 mmol/L — ABNORMAL LOW (ref 135–145)

## 2024-02-02 LAB — CBC
HCT: 27.7 % — ABNORMAL LOW (ref 36.0–46.0)
Hemoglobin: 9.1 g/dL — ABNORMAL LOW (ref 12.0–15.0)
MCH: 30.2 pg (ref 26.0–34.0)
MCHC: 32.9 g/dL (ref 30.0–36.0)
MCV: 92 fL (ref 80.0–100.0)
Platelets: 370 10*3/uL (ref 150–400)
RBC: 3.01 MIL/uL — ABNORMAL LOW (ref 3.87–5.11)
RDW: 17.4 % — ABNORMAL HIGH (ref 11.5–15.5)
WBC: 9.1 10*3/uL (ref 4.0–10.5)
nRBC: 0 % (ref 0.0–0.2)

## 2024-02-02 MED ORDER — APIXABAN 5 MG PO TABS: 5.0000 mg | ORAL_TABLET | Freq: Two times a day (BID) | ORAL | 3 refills | Status: DC

## 2024-02-02 MED ORDER — MAGNESIUM SULFATE 2 GM/50ML IV SOLN
2.0000 g | Freq: Once | INTRAVENOUS | Status: AC
Start: 2024-02-02 — End: 2024-02-02
  Administered 2024-02-02: 2 g via INTRAVENOUS
  Filled 2024-02-02: qty 50

## 2024-02-02 MED ORDER — AMIODARONE HCL 200 MG PO TABS: 200.0000 mg | ORAL_TABLET | Freq: Two times a day (BID) | ORAL | 6 refills | Status: DC

## 2024-02-02 NOTE — TOC Transition Note (Signed)
 Transition of Care [facility]) - Discharge Note   Patient Details  Name: Melanie Browning MRN: [id number] Date of Birth: 1940-10-12  Transition of Care [facility]) CM/SW Contact:  Elsie Halo, RN Phone Number: 02/02/2024, 11:38 AM   Clinical Narrative:     Patient is medically clear back to ILF at [facility] with [facility] PT/OT. TOC faxed orders to [place] [phone] at [facility]. No other TOC needs identified.  Final next level of care: Home w Home Health Services Barriers to Discharge: Continued Medical Work up   Patient Goals and CMS Choice Patient states their goals for this hospitalization and ongoing recovery are:: Back to TL ILF after rehab   Choice offered to / list presented to : Patient      Discharge Placement                       Discharge Plan and Services Additional resources added to the After Visit Summary for       Post Acute Care Choice: Skilled Nursing Facility                    HH Arranged: PT, OT [facility] Agency: Other - See comment ([place] PT/OT)        Social Drivers of Health (SDOH) Interventions SDOH Screenings   Food Insecurity: No Food Insecurity (01/21/2024)  Housing: Low Risk  (01/23/2024)  Transportation Needs: No Transportation Needs (01/23/2024)  Utilities: Not At Risk (01/21/2024)  Depression (PHQ2-9): Low Risk  (01/03/2023)  Financial Resource Strain: Low Risk  (11/06/2023)   Received from [facility]  Social Connections: Socially Integrated (01/21/2024)  Tobacco Use: Low Risk  (01/20/2024)     Readmission Risk Interventions     No data to display

## 2024-02-02 NOTE — Progress Notes (Signed)
 Progress Note   Patient: Melanie Browning WJX:[id number] DOB: January 15, 1941 DOA: 01/20/2024     13 DOS: the patient was seen and examined on 02/02/2024   Brief hospital course: 83 y.o. 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 Patient had a left lower extremity angioplasty and stent placement on 4/23.  Postoperatively, patient was transferred to ICU due to new onset atrial fibrillation with IVR, patient also with acute blood loss anemia, required multiple units of blood transfusion.  Also had urinary retension, Foley catheter was anchored by urology.   Principal Problem:   Atherosclerosis of artery of extremity with rest pain [facility]) Active Problems:   Atrial fibrillation (HCC)   Hypotension   Non-ST elevation (NSTEMI) myocardial infarction (HCC)   Blood loss anemia   Elevated troponin   Intestinal occlusion (HCC)   Assessment and Plan: New a fib with RVR now rate controlled  NSTEMI -- history of coronary artery disease with prior stent placement to LAD in 2022 -- converted to sinus rhythm with amiodarone  infusion --  IV heparin  drip--> apixaban , brilinta  -- outpatient workup per cardiology consult. Patient currently is chest pain free, Could not tolerate statin. No new issues.  Currently in sinus. If discharged, she will follow-up with Dr. Alvenia Aus in 2 weeks.     Severe peripheral arterial disease lower extremity status post angioplasty and stent placement. post procedure groin hematoma Acute blood loss anemia. Patient's status post peripheral arterial angioplasty and stent placement.  Postoperatively, patient developed acute blood loss anemia and right groin hematoma. --pt s/p 4 unit BT  per vascular sx  Mild iron  deficiency, start oral iron .  Copper level elevated. 5/1. Hemoglobin dropped down to 7.6 today, continue to monitor, transfuse  PRBC, goal of 8.0 due to heart disease.  Also received IV iron . Hemoglobin has been stable, no additional bleeding.  Hemoglobin increased to 9.1.   Hyponatremia Hypokalemia Hypomagnesemia. Mild metabolic acidosis resolved. Sodium level still low, received IV fluid yesterday.  Added salt tablets.  Also started fluid restriction. Condition improved, magnesium  1.7, will give additional 2 g magnesium  sulfate.    Hyperlipidemia Refused statin, could not convince her to take it.   Urinary retention New on 4/29, urology placed foley, advises maintain for a week, then TOV Discontinue Foley catheter today, voiding trial.   Hypothyroid TSH 8.09, increased Synthroid  to 100 mcg daily.   Chronic ileus. Patient has diffuse bowel dilatation KUB, reviewed prior record, this appears to be chronic.  Patient has been having daily bowel movements.    Patient no longer want to go to nursing home, wish to go home with PT/OT.  Medically stable for discharge from my standpoint.     Subjective:  Patient doing well today, no complaints.  Daily bowel movement.  Physical Exam: Vitals:   02/01/24 1544 02/01/24 1940 02/02/24 0353 02/02/24 0754  BP: 124/68 (!) 145/73 137/67 134/82  Pulse: 67 71 69 70  Resp:   17 16  Temp: 98.5 F (36.9 C) 98.5 F (36.9 C) 98.4 F (36.9 C) 98.1 F (36.7 C)  TempSrc:   Oral   SpO2: 100% 99% 97% 100%  Weight:      Height:       General exam: Appears calm and comfortable  Respiratory system: Clear to auscultation. Respiratory effort normal. Cardiovascular system: S1 & S2 heard, RRR. No JVD, murmurs, rubs, gallops or clicks. No pedal edema. Gastrointestinal system: Abdomen  is nondistended, soft and nontender. No organomegaly or masses felt. Normal bowel sounds heard. Central nervous system: Alert and oriented. No focal neurological deficits. Extremities: Symmetric 5 x 5 power. Skin: No rashes, lesions or ulcers Psychiatry: Judgement and insight appear normal. Mood  & affect appropriate.    Data Reviewed:  Lab results reviewed.  Family Communication: None  Disposition: Status is: Inpatient Remains inpatient appropriate because: Per primary team.     Time spent: 35 minutes  Author: Donaciano Frizzle, MD 02/02/2024 9:20 AM  For on call review [email or web address].

## 2024-02-02 NOTE — Discharge Instructions (Signed)
 Vascular surgery discharge instructions  Do not lift anything heavy for the next 2 weeks.  Do not lift anything more than a gallon of milk.  Do not drive for 1 week.  You may shower when you get home.  Let soap and water  run over your incision sites.  After showering make sure both groins are clean and dry.  You are being discharged on Brilinta  90 mg twice daily and Eliquis  2.5 mg twice daily.  Do not miss or skip any of these medications as it will interfere with the outcome of your procedure.  Follow-up in vein and vascular clinic with Dr. [name] MD in 2 weeks as scheduled.

## 2024-02-02 NOTE — Progress Notes (Signed)
 Occupational Therapy Treatment Patient Details Name: Melanie Browning MRN: [id number] DOB: 05-Feb-1941 Today's Date: 02/02/2024   History of present illness Melanie Browning is an 83yoF comes to [facility] 4/22 for LLE angiography, thrombectomy, and stent placement for limb salvage. PMH: PVD with critical limb ischemia, CAD s/p PCI to LAD 10/2020, HTN. Post procedure pt developed AF c RVR, hypotension, noted ABLA Hb down to 5.9. Pt required CCU transfer. At baseline pt very active, exercises regularly, lives at [facility] with husband.   OT comments  Pt seen for OT tx. Pt demonstrating excellent progress towards goals. Denies pain, completes bed mobility without assist and stands to complete grooming tasks at the sink with supv. Pt educated in ECS and faciltiated problem solving for ADL/IADL participation upon return home to maximize safety/indep and return to PLOF while ambulating ~180' with RW with supv. Pt verbalized understanding, demonstrates good body awareness and safety awareness. Recs updated to reflect progress.       If plan is discharge home, recommend the following:  A little help with bathing/dressing/bathroom;Assistance with cooking/housework;Assist for transportation;Help with stairs or ramp for entrance   Equipment Recommendations  BSC/3in1    Recommendations for Other Services      Precautions / Restrictions Precautions Precautions: Fall Recall of Precautions/Restrictions: Intact Restrictions Weight Bearing Restrictions Per Provider Order: No       Mobility Bed Mobility Overal bed mobility: Needs Assistance Bed Mobility: Supine to Sit     Supine to sit: Modified independent (Device/Increase time)          Transfers Overall transfer level: Needs assistance Equipment used: Rolling walker (2 wheels) Transfers: Sit to/from Stand Sit to Stand: Supervision                 Balance Overall balance assessment: Mild deficits observed, not formally tested                                          ADL either performed or assessed with clinical judgement   ADL Overall ADL's : Needs assistance/impaired     Grooming: Standing;Supervision/safety;Oral care               Lower Body Dressing: Minimal assistance Lower Body Dressing Details (indicate cue type and reason): MIN socks             Functional mobility during ADLs: Rolling walker (2 wheels);Supervision/safety      Extremity/Trunk Assessment              Occupational psychologist Communication: No apparent difficulties   Cognition Arousal: Alert Behavior During Therapy: WFL for tasks assessed/performed Cognition: No apparent impairments                               Following commands: Intact        Cueing      Exercises Other Exercises Other Exercises: Pt educated in ECS and faciltiated problem solving for ADL/IADL participation upon return home to maximize safety/indep and return to PLOF while ambulating ~180' with RW with supv.    Shoulder Instructions       General Comments      Pertinent Vitals/ Pain       Pain Assessment Pain Assessment: No/denies pain  Home Living  Prior Functioning/Environment              Frequency  Min 2X/week        Progress Toward Goals  OT Goals(current goals can now be found in the care plan section)  Progress towards OT goals: Progressing toward goals  Acute Rehab OT Goals Patient Stated Goal: go home OT Goal Formulation: With patient Time For Goal Achievement: 02/12/24 Potential to Achieve Goals: Good  Plan      Co-evaluation                 AM-PAC OT "6 Clicks" Daily Activity     Outcome Measure   Help from another person eating meals?: None Help from another person taking care of personal grooming?: None Help from another person toileting, which includes using toliet,  bedpan, or urinal?: A Little Help from another person bathing (including washing, rinsing, drying)?: A Little Help from another person to put on and taking off regular upper body clothing?: None Help from another person to put on and taking off regular lower body clothing?: A Little 6 Click Score: 21    End of Session    OT Visit Diagnosis: Unsteadiness on feet (R26.81);Repeated falls (R29.6);Muscle weakness (generalized) (M62.81)   Activity Tolerance Patient tolerated treatment well   Patient Left in chair;with call bell/phone within reach   Nurse Communication          Time: 9629-5284 OT Time Calculation (min): 19 min  Charges: OT General Charges $OT Visit: 1 Visit OT Treatments $Self Care/Home Management : 8-22 mins  Berenda Breaker., MPH, MS, OTR/L ascom [phone] 02/02/24, 11:51 AM

## 2024-02-02 NOTE — Discharge Summary (Addendum)
 [facility]    Discharge Summary    Patient ID:  Melanie Browning MRN: [id number] DOB/AGE: [birth year] 83 y.o.  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)    a.) multiple vascular interventions (PTA, stenting, arthrectomy, and thrombectomy) of LEFT SFA, LEFT popliteal, LEFT posterior tibial, and LEFT anterior tibial arteries due to critical limb ischemia   CAD (coronary artery disease) 11/09/2020   a.) cCTA 11/09/2020: Ca2+ 539 (83rd %'ile for age/sex/race match control); <25% dRCA & pLCx, >70% pLAD; b.) LHC/PCI 11/15/2020: 90% pLAD (2.75 x 18 mm Resolute Onyx DES); c.) MV 06/26/2023: no sig ischemia   Cervical spondylosis    Colitis    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)    a.) s/p multiple vascular interventions   Diastolic dysfunction 06/26/2023   a.) TTE 06/26/2023: EF 60-65%, no RWMAs, G2DD, sev AoV thickening with mild AR, mild-mod AS (MPG 18.2 mmHg; AVA 0.73 cm2)   [place]   Dyslipidemia    History of cervical cancer    a.) s/p TAH   HPV (human papilloma virus) infection    Hyperlipidemia    Hypothyroidism    IGT (impaired glucose tolerance)    Left upper lobe pulmonary nodule    Long term current use of aspirin     Long term current use of ticagrelor  therapy    Macrocytic anemia    Osteoporosis    a.) Tx'd with zolodronic acid (Reclast)  infusions   PAD (peripheral artery disease) (HCC)    Polymyalgia rheumatica (HCC)    Skin cancer of nose    Transaminitis    Vaginal dysplasia     Procedure(s): Lower Extremity Angiography  Discharged Condition: good  HPI:  Melanie Browning is an 83 year old female now status post left lower extremity angiogram with lysis treatment.  She underwent this procedure for left lower extremity limb ischemia and possible limb loss.  Patient is recovering as expected today.  She is ambulating voiding and eating well.  She has no complications overnight vitals are remained stable patient to be discharged later today.  Patient to be discharged on Brilinta  90 mg twice daily, Eliquis  5.0 mg twice daily, and Crestor 5 mg.  Patient was counseled not to miss or skip any of these medications as it will interfere with the outcome of her procedure.  Discharge total time with teaching was greater than 45 minutes.  Hospital Course:  [name] is a 83 y.o. female is S/P Left lower extremity angiogram with lysis treatment.  Patient is recovering as expected.  She had noted left lower extremity reperfusion syndrome with swelling that is recovering as expected.  Patient also had difficulty urinating postprocedure and a catheter was placed afterwards.  She had a 7-day post void trial which was successful.  Patient also on was noted to have a recurrence of her colitis with colonic ileus which is now resolved as well.  Patient's having normal bowel  movements today.  Patient will follow-up with gastroenterology as scheduled.  Patient also had an episode of atrial fibrillation that was paroxysmal with RVR.  Patient was seen by cardiology placed on amiodarone  and returned to normal sinus rhythm immediately afterwards.  She will follow-up with cardiology as scheduled.   Extubated: POD # 0 Physical Exam:  Alert notes x3, no acute distress Face: Symmetrical.  Tongue is midline. Neck: Trachea is midline.  No swelling or  bruising. Cardiovascular: Regular rate and rhythm Pulmonary: Clear to auscultation bilaterally Abdomen: Soft, nontender, nondistended Right groin access: Clean dry and intact.  No swelling or drainage noted Left groin access: Clean dry and intact.  No swelling or drainage noted Left lower extremity: Thigh soft.  Calf soft.  Extremities warm distally toes.  Hard to palpate pedal pulses however the foot is warm is her good capillary refill. Right lower extremity: Thigh soft.  Calf soft.  Extremities warm distally toes.  Hard to palpate pedal pulses however the foot is warm is her good capillary refill. Neurological: No deficits noted   Post-op wounds:  clean, dry, intact or healing well  Pt. Ambulating, voiding and taking PO diet without difficulty. Pt pain controlled with PO pain meds.  Labs:  As below  Complications: none  Consults:  Treatment Team:  Donaciano Frizzle, MD  Significant Diagnostic Studies: CBC Lab Results  Component Value Date   WBC 9.1 02/02/2024   HGB 9.1 (L) 02/02/2024   HCT 27.7 (L) 02/02/2024   MCV 92.0 02/02/2024   PLT 370 02/02/2024    BMET    Component Value Date/Time   NA 134 (L) 02/02/2024 0530   K 3.9 02/02/2024 0530   CL 104 02/02/2024 0530   CO2 21 (L) 02/02/2024 0530   GLUCOSE 93 02/02/2024 0530   BUN 15 02/02/2024 0530   CREATININE 0.65 02/02/2024 0530   CALCIUM  8.5 (L) 02/02/2024 0530   GFRNONAA >60 02/02/2024 0530   COAG No results found for: "INR", "PROTIME"   Disposition:  Discharge to :Home  Allergies as of 02/02/2024       Reactions   Lactose Other (See Comments), Nausea And Vomiting   Gi upset and diarrhea Other reaction(s): Other (See Comments) Gi upset and diarrhea  Other reaction(s): Other (See Comments) Gi upset and diarrhea Gi upset and diarrhea Gi upset and diarrhea    Gi upset and diarrhea    Gi upset and diarrhea Gi upset and diarrhea Gi upset and diarrhea Gi upset and diarrhea    Gi upset and diarrhea  Other  reaction(s): Other (See Comments) Gi upset and diarrhea Gi upset and diarrhea Gi upset and diarrhea  Gi upset and diarrhea  Gi upset and diarrhea Gi upset and diarrhea Gi upset and diarrhea        Medication List     STOP taking these medications    aspirin  EC 81 MG tablet       TAKE these medications    amiodarone  200 MG tablet Commonly known as: PACERONE  Take 1 tablet (200 mg total) by mouth 2 (two) times daily.   apixaban  5 MG Tabs tablet Commonly known as: Eliquis  Take 1 tablet (5 mg total) by mouth 2 (two) times daily.   Brilinta  90 MG Tabs tablet Generic drug: ticagrelor  TAKE ONE TABLET BY MOUTH TWICE DAILY   budesonide  3 MG 24 hr capsule Commonly known as: ENTOCORT EC  Take 3 capsules (9 mg total) by mouth every morning.   diphenoxylate -atropine  2.5-0.025 MG tablet Commonly known  as: LOMOTIL  TAKE 1 TABLET BY MOUTH 3 TIMES DAILY AS NEEDED FOR DIARRHEA.   estradiol  0.1 MG/GM vaginal cream Commonly known as: ESTRACE  Insert 1/4 applicator full into vagina at night twice a week   HYDROcodone -acetaminophen  10-325 MG tablet Commonly known as: NORCO Take 1 tablet by mouth every 6 (six) hours as needed for severe pain (pain score 7-10).   levothyroxine  75 MCG tablet Commonly known as: SYNTHROID  Take 75 mcg by mouth daily before breakfast.   losartan  25 MG tablet Commonly known as: COZAAR  Take 25 mg by mouth daily.   rosuvastatin 5 MG tablet Commonly known as: CRESTOR Take 5 mg by mouth daily.   Vitamin B-Complex Tabs Take 1 tablet by mouth daily.       Verbal and written Discharge instructions given to the patient. Wound care per Discharge AVS  Follow-up Information     Eartha Gold, MD Follow up.   Specialty: Infectious Diseases Why: Hospital follow up Contact information: [address] [place] [address] [phone]         Wenona Hamilton, MD Follow up in 2 week(s).   Specialty: Cardiology Contact information: [address] [place] [address] [phone]         Prescilla Brod, Ninette Basque, MD Follow up in 2 week(s).   Specialties: Vascular Surgery, Cardiology, Radiology, Vascular Surgery Why: GS Only. Duplex ultrasound of her left lower extremity with ABI's Contact information: [address] [place] [address] [phone]         Patrcia Bonus Follow up in 1 month(s).   Specialty: Internal Medicine Why: Follow up post Hospitilization with Colonic Colitis. Contact information: [address] [place] [address] [phone]                 Signed: Annamaria Barrette, NP  02/02/2024, 12:05 PM

## 2024-02-03 LAB — CYTOCHROME P450 2C19

## 2024-02-04 ENCOUNTER — Telehealth: Payer: Self-pay | Admitting: Cardiovascular Disease

## 2024-02-04 NOTE — Telephone Encounter (Signed)
 Ok

## 2024-02-04 NOTE — Telephone Encounter (Signed)
 Pt called in requesting to switch from Dr. Gollan to Dr. Alvenia Aus, is this switch okay?

## 2024-02-11 ENCOUNTER — Ambulatory Visit: Admitting: Urology

## 2024-02-13 ENCOUNTER — Encounter (INDEPENDENT_AMBULATORY_CARE_PROVIDER_SITE_OTHER): Payer: Self-pay | Admitting: Vascular Surgery

## 2024-02-13 ENCOUNTER — Other Ambulatory Visit (INDEPENDENT_AMBULATORY_CARE_PROVIDER_SITE_OTHER): Payer: Self-pay | Admitting: Vascular Surgery

## 2024-02-13 DIAGNOSIS — E782 Mixed hyperlipidemia: Secondary | ICD-10-CM

## 2024-02-13 DIAGNOSIS — I872 Venous insufficiency (chronic) (peripheral): Secondary | ICD-10-CM

## 2024-02-13 DIAGNOSIS — I25118 Atherosclerotic heart disease of native coronary artery with other forms of angina pectoris: Secondary | ICD-10-CM

## 2024-02-13 DIAGNOSIS — S85002S Unspecified injury of popliteal artery, left leg, sequela: Secondary | ICD-10-CM

## 2024-02-13 DIAGNOSIS — I70212 Atherosclerosis of native arteries of extremities with intermittent claudication, left leg: Secondary | ICD-10-CM

## 2024-02-13 DIAGNOSIS — I739 Peripheral vascular disease, unspecified: Secondary | ICD-10-CM

## 2024-02-16 ENCOUNTER — Encounter (INDEPENDENT_AMBULATORY_CARE_PROVIDER_SITE_OTHER): Payer: Self-pay | Admitting: Vascular Surgery

## 2024-02-16 ENCOUNTER — Ambulatory Visit (INDEPENDENT_AMBULATORY_CARE_PROVIDER_SITE_OTHER): Admitting: Vascular Surgery

## 2024-02-16 ENCOUNTER — Ambulatory Visit (INDEPENDENT_AMBULATORY_CARE_PROVIDER_SITE_OTHER)

## 2024-02-16 VITALS — BP 144/77 | HR 65 | Resp 16

## 2024-02-16 DIAGNOSIS — I739 Peripheral vascular disease, unspecified: Secondary | ICD-10-CM

## 2024-02-16 DIAGNOSIS — I872 Venous insufficiency (chronic) (peripheral): Secondary | ICD-10-CM

## 2024-02-16 DIAGNOSIS — Z9889 Other specified postprocedural states: Secondary | ICD-10-CM | POA: Diagnosis not present

## 2024-02-16 DIAGNOSIS — E785 Hyperlipidemia, unspecified: Secondary | ICD-10-CM

## 2024-02-16 DIAGNOSIS — I70229 Atherosclerosis of native arteries of extremities with rest pain, unspecified extremity: Secondary | ICD-10-CM

## 2024-02-16 DIAGNOSIS — I25118 Atherosclerotic heart disease of native coronary artery with other forms of angina pectoris: Secondary | ICD-10-CM

## 2024-02-16 LAB — VAS US ABI WITH/WO TBI
Left ABI: 1.14
Right ABI: 1.07

## 2024-02-17 ENCOUNTER — Encounter (INDEPENDENT_AMBULATORY_CARE_PROVIDER_SITE_OTHER): Payer: Self-pay

## 2024-02-17 ENCOUNTER — Encounter (INDEPENDENT_AMBULATORY_CARE_PROVIDER_SITE_OTHER): Payer: Self-pay | Admitting: Vascular Surgery

## 2024-02-18 ENCOUNTER — Encounter (INDEPENDENT_AMBULATORY_CARE_PROVIDER_SITE_OTHER): Payer: Self-pay | Admitting: Vascular Surgery

## 2024-02-24 ENCOUNTER — Ambulatory Visit: Attending: Cardiovascular Disease | Admitting: Cardiovascular Disease

## 2024-02-24 ENCOUNTER — Encounter: Payer: Self-pay | Admitting: Cardiovascular Disease

## 2024-02-24 VITALS — BP 130/68 | HR 70 | Ht 68.0 in | Wt 138.8 lb

## 2024-02-24 DIAGNOSIS — I480 Paroxysmal atrial fibrillation: Secondary | ICD-10-CM | POA: Diagnosis not present

## 2024-02-24 DIAGNOSIS — I25118 Atherosclerotic heart disease of native coronary artery with other forms of angina pectoris: Secondary | ICD-10-CM | POA: Diagnosis not present

## 2024-02-24 DIAGNOSIS — I739 Peripheral vascular disease, unspecified: Secondary | ICD-10-CM | POA: Insufficient documentation

## 2024-02-24 DIAGNOSIS — E785 Hyperlipidemia, unspecified: Secondary | ICD-10-CM | POA: Diagnosis present

## 2024-02-24 MED ORDER — AMIODARONE HCL 200 MG PO TABS: 200.0000 mg | ORAL_TABLET | Freq: Every day | ORAL | 2 refills | Status: AC

## 2024-02-24 MED ORDER — ROSUVASTATIN CALCIUM 5 MG PO TABS: 5.0000 mg | ORAL_TABLET | Freq: Every day | ORAL | 1 refills | Status: AC

## 2024-02-24 NOTE — Progress Notes (Signed)
 Cardiology Office Note   Date:  02/24/2024   ID:  Melanie Browning, DOB 1941/02/16, MRN [id number]  PCP:  Eartha Gold, MD  Cardiologist:   Antionette Kirks, MD   Chief Complaint  Patient presents with   Follow-up    HAS SOME SOB AND FATIGUE WHILE WALKING      History of Present Illness: Melanie Browning is a 83 y.o. female who presents to establish cardiovascular care with me.  She was previously followed by Dr. Gollan.  She has known history of coronary artery disease status post PCI and stent placement to the LAD in 2022, extensive peripheral arterial disease with critical limb ischemia and multiple revascularization of the lower extremity followed by Dr. Prescilla Brod, essential hypertension and hyperlipidemia. She was recently hospitalized for critical limb ischemia and underwent endovascular intervention on the left lower extremity.  She developed A-fib with RVR in the setting of severe leg pain.  She was started on amiodarone  drip and was anticoagulated.  She was in the hospital for 2 weeks.  She had multiple complications including colitis, anemia and A-fib. She is doing reasonably well overall and denies chest pain.  She does complain of fatigue and shortness of breath but she is slowly improving.  No palpitations.  She is tolerating anticoagulation with Eliquis  as well as antiplatelet therapy with ticagrelor . She consulted with a physician at the [facility] clinic and started a plant-based diet.   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)    a.) multiple vascular interventions (PTA, stenting, arthrectomy, and thrombectomy) of LEFT SFA, LEFT popliteal, LEFT posterior tibial, and LEFT anterior tibial arteries due to critical limb ischemia   CAD (coronary  artery disease) 11/09/2020   a.) cCTA 11/09/2020: Ca2+ 539 (83rd %'ile for age/sex/race match control); <25% dRCA & pLCx, >70% pLAD; b.) LHC/PCI 11/15/2020: 90% pLAD (2.75 x 18 mm Resolute Onyx DES); c.) MV 06/26/2023: no sig ischemia   Cervical spondylosis    Colitis    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)    a.) s/p multiple vascular interventions   Diastolic dysfunction 06/26/2023   a.) TTE 06/26/2023: EF 60-65%, no RWMAs, G2DD, sev AoV thickening with mild AR, mild-mod AS (MPG 18.2 mmHg; AVA 0.73 cm2)   [place]   Dyslipidemia    History of cervical cancer    a.) s/p TAH   HPV (human papilloma virus) infection    Hyperlipidemia    Hypothyroidism    IGT (impaired glucose tolerance)    Left upper lobe pulmonary nodule    Long term current use of aspirin     Long term current use of ticagrelor  therapy    Macrocytic anemia    Osteoporosis    a.) Tx'd with zolodronic acid (Reclast) infusions   PAD (peripheral artery disease) (HCC)    Polymyalgia rheumatica (HCC)    Skin cancer of nose    Transaminitis    Vaginal dysplasia     Past Surgical History:  Procedure Laterality Date   COLONOSCOPY WITH PROPOFOL  N/A 10/04/2020   Procedure: COLONOSCOPY WITH PROPOFOL ;  Surgeon: Toledo, Alphonsus Jeans, MD;  Location: [facility] ENDOSCOPY;  Service: Gastroenterology;  Laterality: N/A;   COLPOSCOPY     CORONARY  STENT INTERVENTION N/A 11/15/2020   Procedure: CORONARY STENT INTERVENTION;  Surgeon: Antonette Batters, MD;  Location: [facility] INVASIVE CV LAB;  Service: Cardiovascular;  Laterality: N/A;   FEMORAL-POPLITEAL BYPASS GRAFT Left 07/16/2023   Procedure: BYPASS GRAFT FEMORAL-POPLITEAL ARTERY (FEMORAL-POSTERIOR TIBIAL BYPASS W/ SAPHENOUS VEIN- [id number]);  Surgeon: Jackquelyn Mass, MD;  Location: [facility] ORS;  Service: Vascular;  Laterality: Left;   KNEE ARTHROSCOPY Left    LAPAROSCOPIC HYSTERECTOMY     LEFT HEART CATH AND CORONARY ANGIOGRAPHY N/A  11/15/2020   Procedure: LEFT HEART CATH AND CORONARY ANGIOGRAPHY;  Surgeon: Antonette Batters, MD;  Location: [facility] INVASIVE CV LAB;  Service: Cardiovascular;  Laterality: N/A;   LOWER EXTREMITY ANGIOGRAPHY Left 09/05/2020   Procedure: LOWER EXTREMITY ANGIOGRAPHY;  Surgeon: Jackquelyn Mass, MD;  Location: [facility]VAS[facility] CV LAB;  Service: Cardiovascular;  Laterality: Left;   LOWER EXTREMITY ANGIOGRAPHY Left 04/10/2021   Procedure: LOWER EXTREMITY ANGIOGRAPHY;  Surgeon: Jackquelyn Mass, MD;  Location: [facility];  Service: Cardiovascular;  Laterality: Left;   LOWER EXTREMITY ANGIOGRAPHY Left 11/20/2021   Procedure: Lower Extremity Angiography;  Surgeon: Jackquelyn Mass, MD;  Location: [facility];  Service: Cardiovascular;  Laterality: Left;   LOWER EXTREMITY ANGIOGRAPHY Left 06/05/2022   Procedure: Lower Extremity Angiography;  Surgeon: Jackquelyn Mass, MD;  Location: [facility];  Service: Cardiovascular;  Laterality: Left;   LOWER EXTREMITY ANGIOGRAPHY Left 07/23/2022   Procedure: Lower Extremity Angiography;  Surgeon: Jackquelyn Mass, MD;  Location: [facility];  Service: Cardiovascular;  Laterality: Left;   LOWER EXTREMITY ANGIOGRAPHY Left 12/27/2022   Procedure: Lower Extremity Angiography;  Surgeon: Jackquelyn Mass, MD;  Location: [facility];  Service: Cardiovascular;  Laterality: Left;   LOWER EXTREMITY ANGIOGRAPHY Left 06/17/2023   Procedure: Lower Extremity Angiography;  Surgeon: Jackquelyn Mass, MD;  Location: [facility];  Service: Cardiovascular;  Laterality: Left;   LOWER EXTREMITY ANGIOGRAPHY Left 01/20/2024   Procedure: Lower Extremity Angiography;  Surgeon: Jackquelyn Mass, MD;  Location: [facility];  Service: Cardiovascular;  Laterality: Left;   LOWER EXTREMITY ANGIOGRAPHY Left 01/21/2024   Procedure: Lower Extremity Angiography;  Surgeon: Jackquelyn Mass, MD;  Location: [facility];   Service: Cardiovascular;  Laterality: Left;   LOWER EXTREMITY INTERVENTION Left 01/20/2024   Procedure: LOWER EXTREMITY INTERVENTION;  Surgeon: Jackquelyn Mass, MD;  Location: [facility];  Service: Cardiovascular;  Laterality: Left;   TOTAL ABDOMINAL HYSTERECTOMY       Current Outpatient Medications  Medication Sig Dispense Refill   amiodarone  (PACERONE ) 200 MG tablet Take 1 tablet (200 mg total) by mouth 2 (two) times daily. 60 tablet 6   apixaban  (ELIQUIS ) 5 MG TABS tablet Take 1 tablet (5 mg total) by mouth 2 (two) times daily. 60 tablet 3   B Complex Vitamins (VITAMIN B-COMPLEX) TABS Take 1 tablet by mouth daily.     budesonide  (ENTOCORT EC ) 3 MG 24 hr capsule Take 3 capsules (9 mg total) by mouth every morning. 270 capsule 2   diphenoxylate -atropine  (LOMOTIL ) 2.5-0.025 MG tablet TAKE 1 TABLET BY MOUTH 3 TIMES DAILY AS NEEDED FOR DIARRHEA. 90 tablet 0   estradiol  (ESTRACE ) 0.1 MG/GM vaginal cream Insert 1/4 applicator full into vagina at night twice a week 42.5 g 2   levothyroxine  (SYNTHROID ) 75 MCG tablet Take 75 mcg by mouth daily before breakfast.      losartan  (COZAAR ) 25 MG tablet Take 25  mg by mouth daily.     rosuvastatin (CRESTOR) 5 MG tablet Take 5 mg by mouth daily.     ticagrelor  (BRILINTA ) 90 MG TABS tablet TAKE ONE TABLET BY MOUTH TWICE DAILY 180 tablet 0   No current facility-administered medications for this visit.    Allergies:   Lactose    Social History:  The patient  reports that she has never smoked. She has never used smokeless tobacco. She reports current alcohol  use of about 1.0 standard drink of alcohol  per week. She reports that she does not use drugs.   Family History:  The patient's family history includes Cancer in her mother; Heart attack (age of onset: 29) in her father; Heart disease in her father; Heart failure in her mother; Pancreatic cancer in her mother.    ROS:  Please see the history of present illness.   Otherwise, review of  systems are positive for none.   All other systems are reviewed and negative.    PHYSICAL EXAM: VS:  BP 130/68 (BP Location: Left Arm, Patient Position: Sitting, Cuff Size: Normal)   Pulse 70   Ht 5\' 8"  (1.727 m)   Wt 138 lb 12.8 oz (63 kg)   SpO2 99%   BMI 21.10 kg/m  , BMI Body mass index is 21.1 kg/m. GEN: Well nourished, well developed, in no acute distress  HEENT: normal  Neck: no JVD, carotid bruits, or masses Cardiac: RRR; no  rubs, or gallops,no edema .  2 out of 6 systolic murmur in the aortic area which is early peaking Respiratory:  clear to auscultation bilaterally, normal work of breathing GI: soft, nontender, nondistended, + BS MS: no deformity or atrophy  Skin: warm and dry, no rash Neuro:  Strength and sensation are intact Psych: euthymic mood, full affect   EKG:  EKG is ordered today. The ekg ordered today demonstrates : Normal sinus rhythm Normal ECG    Recent Labs: 01/26/2024: TSH 8.093 01/27/2024: ALT 14 02/02/2024: BUN 15; Creatinine, Ser 0.65; Hemoglobin 9.1; Magnesium  1.7; Platelets 370; Potassium 3.9; Sodium 134    Lipid Panel    Component Value Date/Time   CHOL 323 (H) 06/17/2023 0931   TRIG 78 06/17/2023 0931   HDL 149 06/17/2023 0931   CHOLHDL 2.2 06/17/2023 0931   LDLCALC 163 (H) 06/17/2023 0931      Wt Readings from Last 3 Encounters:  02/24/24 138 lb 12.8 oz (63 kg)  01/20/24 145 lb 6.4 oz (66 kg)  12/24/23 141 lb 1.5 oz (64 kg)         10/01/2022    9:25 AM  PAD Screen  Previous PAD dx? Yes  Previous surgical procedure? Yes  Dates of procedures left popliteal artery  Pain with walking? No  Feet/toe relief with dangling? No  Painful, non-healing ulcers? No  Extremities discolored? No      ASSESSMENT AND PLAN:  1.  Coronary artery disease involving native coronary arteries without angina: Previous PCI.  Currently with no anginal symptoms.  2.  Peripheral arterial disease: Extensive revascularization most recently  endovascular intervention on the left lower extremity.  She is on ticagrelor  monotherapy given the need for anticoagulation for A-fib.  3.  Hyperlipidemia: She has known history of hyperlipidemia with most recent LDL of 163.  I had a prolonged discussion with her about the indications for treatment of this considering her extensive cardiovascular disease.  She is very hesitant and suspicious of statins overall but agreed to start rosuvastatin 5 mg daily.  4.  Paroxysmal atrial fibrillation: She is maintaining sinus rhythm with amiodarone  which was decreased today to 200 mg once daily.  Continue anticoagulation with Eliquis .    Disposition:   FU with me in 4 months  Signed,  Antionette Kirks, MD  02/24/2024 2:21 PM    [facility]

## 2024-02-24 NOTE — Patient Instructions (Signed)
 Medication Instructions:  DECREASE the Amiodarone  to 200 mg once daily  RESUME the Rosuvastatin 5 mg once daily  *If you need a refill on your cardiac medications before your next appointment, please call your pharmacy*  Lab Work: None ordered If you have labs (blood work) drawn today and your tests are completely normal, you will receive your results only by: MyChart Message (if you have MyChart) OR A paper copy in the mail If you have any lab test that is abnormal or we need to change your treatment, we will call you to review the results.  Testing/Procedures: None ordered  Follow-Up: At [facility], you and your health needs are our priority.  As part of our continuing mission to provide you with exceptional heart care, our providers are all part of one team.  This team includes your primary Cardiologist (physician) and Advanced Practice Providers or APPs (Physician Assistants and Nurse Practitioners) who all work together to provide you with the care you need, when you need it.  Your next appointment:   4 month(s)  Provider:   You may see Dr. Alvenia Aus or one of the following Advanced Practice Providers on your designated Care Team:   Laneta Pintos, NP Gildardo Labrador, PA-C Varney Gentleman, PA-C Cadence Cheswick, PA-C Ronald Cockayne, NP Morey Ar, NP    We recommend signing up for the patient portal called "MyChart".  Sign up information is provided on this After Visit Summary.  MyChart is used to connect with patients for Virtual Visits (Telemedicine).  Patients are able to view lab/test results, encounter notes, upcoming appointments, etc.  Non-urgent messages can be sent to your provider as well.   To learn more about what you can do with MyChart, go to [email or web address].

## 2024-02-25 ENCOUNTER — Encounter (INDEPENDENT_AMBULATORY_CARE_PROVIDER_SITE_OTHER): Payer: Self-pay | Admitting: Vascular Surgery

## 2024-02-26 NOTE — Telephone Encounter (Signed)
 Melanie Browning is stopping by to sign a medical release form.

## 2024-02-26 NOTE — Telephone Encounter (Signed)
Medical release form signed 

## 2024-02-27 ENCOUNTER — Encounter (INDEPENDENT_AMBULATORY_CARE_PROVIDER_SITE_OTHER): Payer: Self-pay | Admitting: Vascular Surgery

## 2024-02-28 ENCOUNTER — Encounter (INDEPENDENT_AMBULATORY_CARE_PROVIDER_SITE_OTHER): Payer: Self-pay | Admitting: Vascular Surgery

## 2024-02-28 NOTE — Progress Notes (Addendum)
 MRN : [id number]  Melanie Browning is a 83 y.o. ([birth year]) female who presents with chief complaint of check circulation.  History of Present Illness:   The patient returns to the office for followup and review status post angiogram with intervention on 01/21/2024.   Procedure:  Percutaneous transluminal angioplasty and stent placement left SFA to 6 mm 2.    Percutaneous transluminal angioplasty and stent placement left tibioperoneal trunk and proximal posterior tibial artery with Esprit stents dilated to 3.5 mm distally and 4 mm proximally 3.     Introduction catheter into the venous branch of the failed saphenous vein bypass 4.    Coil embolization of the venous branch of the failed saphenous vein bypass using Ruby coils  The patient notes improvement in the lower extremity symptoms. No interval shortening of the patient's claudication distance and her rest pain symptoms. No new ulcers or wounds have occurred since the last visit.  There have been no significant changes to the patient's overall health care.  No documented history of amaurosis fugax or recent TIA symptoms. There are no recent neurological changes noted. No documented history of DVT, PE or superficial thrombophlebitis. The patient denies recent episodes of angina or shortness of breath.   ABI's Rt=1.07 and Lt=1.14 (triphasic signal)  (previous ABI's Rt=1.11 and Lt=0.98)   Current Meds  Medication Sig   apixaban  (ELIQUIS ) 5 MG TABS tablet Take 1 tablet (5 mg total) by mouth 2 (two) times daily.   B Complex Vitamins (VITAMIN B-COMPLEX) TABS Take 1 tablet by mouth daily.   budesonide  (ENTOCORT EC ) 3 MG 24 hr capsule Take 3 capsules (9 mg total) by mouth every morning.   diphenoxylate -atropine  (LOMOTIL ) 2.5-0.025 MG tablet TAKE 1 TABLET BY MOUTH 3 TIMES DAILY AS NEEDED FOR DIARRHEA.   estradiol  (ESTRACE ) 0.1 MG/GM vaginal cream Insert 1/4 applicator  full into vagina at night twice a week   levothyroxine  (SYNTHROID ) 75 MCG tablet Take 75 mcg by mouth daily before breakfast.    losartan  (COZAAR ) 25 MG tablet Take 25 mg by mouth daily.   ticagrelor  (BRILINTA ) 90 MG TABS tablet TAKE ONE TABLET BY MOUTH TWICE DAILY   [DISCONTINUED] amiodarone  (PACERONE ) 200 MG tablet Take 1 tablet (200 mg total) by mouth 2 (two) times daily.   [DISCONTINUED] HYDROcodone -acetaminophen  (NORCO) 10-325 MG tablet Take 1 tablet by mouth every 6 (six) hours as needed for severe pain (pain score 7-10).   [DISCONTINUED] rosuvastatin  (CRESTOR ) 5 MG tablet Take 5 mg by mouth daily.    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)    a.) multiple vascular interventions (PTA, stenting, arthrectomy, and thrombectomy) of LEFT SFA, LEFT popliteal, LEFT posterior tibial, and LEFT anterior tibial arteries due to critical limb ischemia   CAD (coronary artery disease) 11/09/2020   a.) cCTA 11/09/2020: Ca2+ 539 (83rd %'ile for age/sex/race match control); <25% dRCA &  pLCx, >70% pLAD; b.) LHC/PCI 11/15/2020: 90% pLAD (2.75 x 18 mm Resolute Onyx DES); c.) MV 06/26/2023: no sig ischemia   Cervical spondylosis    Colitis    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)    a.) s/p multiple vascular interventions   Diastolic dysfunction 06/26/2023   a.) TTE 06/26/2023: EF 60-65%, no RWMAs, G2DD, sev AoV thickening with mild AR, mild-mod AS (MPG 18.2 mmHg; AVA 0.73 cm2)   [place]   Dyslipidemia    History of cervical cancer    a.) s/p TAH   HPV (human papilloma virus) infection    Hyperlipidemia    Hypothyroidism    IGT (impaired glucose tolerance)    Left upper lobe pulmonary nodule    Long term current use  of aspirin     Long term current use of ticagrelor  therapy    Macrocytic anemia    Osteoporosis    a.) Tx'd with zolodronic acid (Reclast) infusions   PAD (peripheral artery disease) (HCC)    Polymyalgia rheumatica (HCC)    Skin cancer of nose    Transaminitis    Vaginal dysplasia     Past Surgical History:  Procedure Laterality Date   COLONOSCOPY WITH PROPOFOL  N/A 10/04/2020   Procedure: COLONOSCOPY WITH PROPOFOL ;  Surgeon: Toledo, Alphonsus Jeans, MD;  Location: [facility];  Service: Gastroenterology;  Laterality: N/A;   COLPOSCOPY     CORONARY STENT INTERVENTION N/A 11/15/2020   Procedure: CORONARY STENT INTERVENTION;  Surgeon: Antonette Batters, MD;  Location: [facility];  Service: Cardiovascular;  Laterality: N/A;   FEMORAL-POPLITEAL BYPASS GRAFT Left 07/16/2023   Procedure: BYPASS GRAFT FEMORAL-POPLITEAL ARTERY (FEMORAL-POSTERIOR TIBIAL BYPASS W/ SAPHENOUS VEIN- [id number]);  Surgeon: Jackquelyn Mass, MD;  Location: [facility];  Service: Vascular;  Laterality: Left;   KNEE ARTHROSCOPY Left    LAPAROSCOPIC HYSTERECTOMY     LEFT HEART CATH AND CORONARY ANGIOGRAPHY N/A 11/15/2020   Procedure: LEFT HEART CATH AND CORONARY ANGIOGRAPHY;  Surgeon: Antonette Batters, MD;  Location: [facility];  Service: Cardiovascular;  Laterality: N/A;   LOWER EXTREMITY ANGIOGRAPHY Left 09/05/2020   Procedure: LOWER EXTREMITY ANGIOGRAPHY;  Surgeon: Jackquelyn Mass, MD;  Location: [facility];  Service: Cardiovascular;  Laterality: Left;   LOWER EXTREMITY ANGIOGRAPHY Left 04/10/2021   Procedure: LOWER EXTREMITY ANGIOGRAPHY;  Surgeon: Jackquelyn Mass, MD;  Location: [facility];  Service: Cardiovascular;  Laterality: Left;   LOWER EXTREMITY ANGIOGRAPHY Left 11/20/2021   Procedure: Lower Extremity Angiography;  Surgeon: Jackquelyn Mass, MD;  Location: [facility];  Service: Cardiovascular;  Laterality: Left;   LOWER EXTREMITY ANGIOGRAPHY Left 06/05/2022    Procedure: Lower Extremity Angiography;  Surgeon: Jackquelyn Mass, MD;  Location: [facility];  Service: Cardiovascular;  Laterality: Left;   LOWER EXTREMITY ANGIOGRAPHY Left 07/23/2022   Procedure: Lower Extremity Angiography;  Surgeon: Jackquelyn Mass, MD;  Location: [facility];  Service: Cardiovascular;  Laterality: Left;   LOWER EXTREMITY ANGIOGRAPHY Left 12/27/2022   Procedure: Lower Extremity Angiography;  Surgeon: Jackquelyn Mass, MD;  Location: [facility];  Service: Cardiovascular;  Laterality: Left;   LOWER EXTREMITY ANGIOGRAPHY Left 06/17/2023   Procedure: Lower Extremity Angiography;  Surgeon: Jackquelyn Mass, MD;  Location: [facility];  Service: Cardiovascular;  Laterality: Left;   LOWER EXTREMITY ANGIOGRAPHY Left 01/20/2024   Procedure: Lower Extremity Angiography;  Surgeon: Prescilla Brod,  Ninette Basque, MD;  Location: [facility];  Service: Cardiovascular;  Laterality: Left;   LOWER EXTREMITY ANGIOGRAPHY Left 01/21/2024   Procedure: Lower Extremity Angiography;  Surgeon: Jackquelyn Mass, MD;  Location: [facility];  Service: Cardiovascular;  Laterality: Left;   LOWER EXTREMITY INTERVENTION Left 01/20/2024   Procedure: LOWER EXTREMITY INTERVENTION;  Surgeon: Jackquelyn Mass, MD;  Location: [facility];  Service: Cardiovascular;  Laterality: Left;   TOTAL ABDOMINAL HYSTERECTOMY      Social History Social History   Tobacco Use   Smoking status: Never   Smokeless tobacco: Never  Vaping Use   Vaping status: Never Used  Substance Use Topics   Alcohol  use: Yes    Alcohol /week: 1.0 standard drink of alcohol     Types: 1 Glasses of wine per week    Comment: 5   Drug use: Never    Family History Family History  Problem Relation Age of Onset   Cancer Mother    Pancreatic cancer Mother    Heart failure Mother    Heart attack Father 49   Heart disease Father     Allergies  Allergen Reactions   Lactose  Other (See Comments) and Nausea And Vomiting    Gi upset and diarrhea  Other reaction(s): Other (See Comments)  Gi upset and diarrhea  Other reaction(s): Other (See Comments) Gi upset and diarrhea Gi upset and diarrhea Gi upset and diarrhea    Gi upset and diarrhea    Gi upset and diarrhea Gi upset and diarrhea Gi upset and diarrhea  Gi upset and diarrhea    Gi upset and diarrhea  Other reaction(s): Other (See Comments) Gi upset and diarrhea Gi upset and diarrhea Gi upset and diarrhea  Gi upset and diarrhea  Gi upset and diarrhea Gi upset and diarrhea Gi upset and diarrhea     REVIEW OF SYSTEMS (Negative unless checked)  Constitutional: [] Weight loss  [] Fever  [] Chills Cardiac: [] Chest pain   [] Chest pressure   [] Palpitations   [] Shortness of breath when laying flat   [] Shortness of breath with exertion. Vascular:  [x] Pain in legs with walking   [] Pain in legs at rest  [] History of DVT   [] Phlebitis   [] Swelling in legs   [] Varicose veins   [] Non-healing ulcers Pulmonary:   [] Uses home oxygen   [] Productive cough   [] Hemoptysis   [] Wheeze  [] COPD   [] Asthma Neurologic:  [] Dizziness   [] Seizures   [] History of stroke   [] History of TIA  [] Aphasia   [] Vissual changes   [] Weakness or numbness in arm   [] Weakness or numbness in leg Musculoskeletal:   [] Joint swelling   [] Joint pain   [] Low back pain Hematologic:  [] Easy bruising  [] Easy bleeding   [] Hypercoagulable state   [] Anemic Gastrointestinal:  [] Diarrhea   [] Vomiting  [] Gastroesophageal reflux/heartburn   [] Difficulty swallowing. Genitourinary:  [] Chronic kidney disease   [] Difficult urination  [] Frequent urination   [] Blood in urine Skin:  [] Rashes   [] Ulcers  Psychological:  [] History of anxiety   []  History of major depression.  Physical Examination  Vitals:   02/16/24 1013  BP: (!) 144/77  Pulse: 65  Resp: 16   There is no height or weight on file to calculate BMI. Gen: WD/WN, NAD Head: [place]/AT, No temporalis wasting.   Ear/Nose/Throat: Hearing grossly intact, nares w/o erythema or drainage Eyes: PER, EOMI, sclera nonicteric.  Neck: Supple, no masses.  No bruit or JVD.  Pulmonary:  Good air  movement, no audible wheezing, no use of accessory muscles.  Cardiac: RRR, normal S1, S2, no Murmurs. Vascular:  mild trophic changes, no open wounds Vessel Right Left  Radial Palpable Palpable  PT Palpable 4+  Palpable  DP Palpable Trace Palpable  Gastrointestinal: soft, non-distended. No guarding/no peritoneal signs.  Musculoskeletal: M/S 5/5 throughout.  No visible deformity.  Neurologic: CN 2-12 intact. Pain and light touch intact in extremities.  Symmetrical.  Speech is fluent. Motor exam as listed above. Psychiatric: Judgment intact, Mood & affect appropriate for pt's clinical situation. Dermatologic: No rashes or ulcers noted.  No changes consistent with cellulitis.   CBC Lab Results  Component Value Date   WBC 9.1 02/02/2024   HGB 9.1 (L) 02/02/2024   HCT 27.7 (L) 02/02/2024   MCV 92.0 02/02/2024   PLT 370 02/02/2024    BMET    Component Value Date/Time   NA 134 (L) 02/02/2024 0530   K 3.9 02/02/2024 0530   CL 104 02/02/2024 0530   CO2 21 (L) 02/02/2024 0530   GLUCOSE 93 02/02/2024 0530   BUN 15 02/02/2024 0530   CREATININE 0.65 02/02/2024 0530   CALCIUM  8.5 (L) 02/02/2024 0530   GFRNONAA >60 02/02/2024 0530   CrCl cannot be calculated (Patient's most recent lab result is older than the maximum 21 days allowed.).  COAG No results found for: "INR", "PROTIME"  Radiology VAS US  ABI WITH/WO TBI Result Date: 02/16/2024  LOWER EXTREMITY DOPPLER STUDY Patient Name:  Melanie Browning  Date of Exam:   02/16/2024 Medical Rec #: [id number]      Accession #:    [id number] Date of Birth: 1940/10/10       Patient Gender: F Patient Age:   51 years Exam Location:  [facility] Procedure:      VAS US  ABI WITH/WO TBI Referring Phys:  --------------------------------------------------------------------------------  Indications: Peripheral artery disease, and new claudication symptoms left              calf.  Vascular Interventions: 01/21/2024 eft lower extremity angiography third order                         catheter placement via existing 6 French sheath                         2. Percutaneous transluminal angioplasty and stent                         placement left SFA to 6 mm                         3. Percutaneous transluminal angioplasty and stent                         placement left tibioperoneal trunk and proximal                         posterior tibial artery with Esprit stents dilated to                         3.5 mm distally and 4 mm proximally                         4. Introduction catheter into the venous branch of the  failed saphenous vein bypass                         5. Coil embolization of the venous branch of the failed                         saphenous vein bypass using Ruby coils                         07/16/2023                         left common femoral artery to posterior tibial artery                         bypass with composite in situ saphenous vein with a                         spliced segment of reversed saphenous vein.                          12/27/2022 Percutaneous transluminal angioplasty and                         stent placement left superficial femoral and popliteal                         arteries to 6 mm                         4. Percutaneous transluminal angioplasty left posterior                         tibial and tibioperoneal trunk to 3.5 mm                         5. Atherectomy of the superficial femoral artery and                         popliteal artery using the Rota Rex catheter.                         6. Mechanical thrombectomy of the tibioperoneal trunk                         using the penumbra CAT 6 catheter.                         10/13/2019: Left popliteal  artery PTA;                         10/14/2019: Left popliteal artery reconstruction;                         02/03/2020: Left popliteal artery angioplasty;                         04/10/2021: Left distal SFA/Popliteal Artery  thrombectomy/PTA/stent;                          11/20/2021: PTA and Stent placement left SFA and                         Popliteal Artery. PTA Left tibioperoneal trunk and                         Posterior Tibial Artery.                         06/05/2022 Rota Rex mechanical thrombectomy of the left                         SFA and popliteal                         3. Percutaneous transluminal angioplasty and stent                         placement left superficial femoral and popliteal                         arteries to 5 mm maximally                         4. Percutaneous transluminal angioplasty left posterior                         tibial to approximately 3.5 mm. Comparison Study: 11/2023 Performing Technologist: Faustine Hoof RVT  Examination Guidelines: A complete evaluation includes at minimum, Doppler waveform signals and systolic blood pressure reading at the level of bilateral brachial, anterior tibial, and posterior tibial arteries, when vessel segments are accessible. Bilateral testing is considered an integral part of a complete examination. Photoelectric Plethysmograph (PPG) waveforms and toe systolic pressure readings are included as required and additional duplex testing as needed. Limited examinations for reoccurring indications may be performed as noted.  ABI Findings: +---------+------------------+-----+---------+--------+ Right    Rt Pressure (mmHg)IndexWaveform Comment  +---------+------------------+-----+---------+--------+ Brachial 139                                      +---------+------------------+-----+---------+--------+ PTA      150               1.07 triphasic         +---------+------------------+-----+---------+--------+  DP       148               1.06 triphasic         +---------+------------------+-----+---------+--------+ Great Toe134               0.96 Normal            +---------+------------------+-----+---------+--------+ +---------+------------------+-----+---------+-------+ Left     Lt Pressure (mmHg)IndexWaveform Comment +---------+------------------+-----+---------+-------+ Brachial 140                                     +---------+------------------+-----+---------+-------+ PTA      159  1.14 triphasic        +---------+------------------+-----+---------+-------+ DP       149               1.06 biphasic         +---------+------------------+-----+---------+-------+ Great Toe138               0.99 Normal           +---------+------------------+-----+---------+-------+ +-------+-----------+-----------+------------+------------+ ABI/TBIToday's ABIToday's TBIPrevious ABIPrevious TBI +-------+-----------+-----------+------------+------------+ Right  1.07       .96        1.11        1.00         +-------+-----------+-----------+------------+------------+ Left   1.14       .99        .98         .92          +-------+-----------+-----------+------------+------------+ Left ABIs and TBIs appear increased.  *See table(s) above for measurements and observations.  Electronically signed by [name] MD on 02/16/2024 at 4:56:56 PM.    Final    DG Abd Portable 1V Result Date: 01/30/2024 CLINICAL DATA:  Abdominal distension. EXAM: PORTABLE ABDOMEN - 1 VIEW COMPARISON:  01/29/24 FINDINGS: Interval improvement in gaseous distension of the bowel loops within the right hemiabdomen with persistent gaseous distension of the bowel loops in the left hemiabdomen. Gas is noted up to the level of the rectum where a rectal tube is in place. No signs of pneumoperitoneum. IMPRESSION: Interval improvement in gaseous distension of the bowel loops within the right  hemiabdomen. Persistent distended loops of bowel in the left hemiabdomen with gas noted up to the rectum. Electronically Signed   By: Kimberley Penman M.D.   On: 01/30/2024 12:31     Assessment/Plan 1. Atherosclerosis of artery of extremity with rest pain (HCC) (Primary) Recommend:  The patient is status post successful angiogram with intervention.  The patient reports that the claudication symptoms and leg pain has improved.   The patient denies lifestyle limiting changes at this point in time.  No further invasive studies, angiography or surgery at this time. The patient should continue walking and begin a more formal exercise program.  The patient should continue antiplatelet therapy and aggressive treatment of the lipid abnormalities  Continued surveillance is indicated as atherosclerosis is likely to progress with time.    Patient should undergo noninvasive studies as ordered. The patient will follow up with me to review the studies.  More frequent monitoring is certainly justified considering all the recurrences that we have struggled with.  Also, of note I am facilitating to the best my ability a second opinion at the [facility] with appointments in both vascular surgery as well as functional medicine.  - VAS US  LOWER EXTREMITY ARTERIAL DUPLEX; Future - VAS US  ABI WITH/WO TBI; Future  2. Chronic venous insufficiency No surgery or intervention at this point in time.   The patient is CEAP [id number]   I have discussed with the patient venous insufficiency and why it  causes symptoms. I have discussed with the patient the chronic skin changes that accompany venous insufficiency and the long term sequela such as infection and ulceration.  Patient will begin wearing graduated compression stockings or compression wraps on a daily basis.  The patient will put the compression on first thing in the morning and removing them in the evening. The patient is instructed  specifically not to sleep in the compression.    In addition, behavioral modification including  several periods of elevation of the lower extremities during the day will be continued. I have demonstrated that proper elevation is a position with the ankles at heart level.  The patient is instructed to begin routine exercise, especially walking on a daily basis  3. Coronary artery disease of native artery of native heart with stable angina pectoris (HCC) Continue cardiac and antihypertensive medications as already ordered and reviewed, no changes at this time.  Continue statin as ordered and reviewed, no changes at this time  Nitrates PRN for chest pain  4. Dyslipidemia Continue statin as ordered and reviewed, no changes at this time    [name], MD  02/28/2024 11:30 AM

## 2024-03-01 ENCOUNTER — Ambulatory Visit (INDEPENDENT_AMBULATORY_CARE_PROVIDER_SITE_OTHER): Admitting: Vascular Surgery

## 2024-03-01 ENCOUNTER — Encounter (INDEPENDENT_AMBULATORY_CARE_PROVIDER_SITE_OTHER)

## 2024-03-02 ENCOUNTER — Other Ambulatory Visit (INDEPENDENT_AMBULATORY_CARE_PROVIDER_SITE_OTHER): Payer: Self-pay | Admitting: Nurse Practitioner

## 2024-03-04 ENCOUNTER — Encounter (INDEPENDENT_AMBULATORY_CARE_PROVIDER_SITE_OTHER): Payer: Self-pay | Admitting: Vascular Surgery

## 2024-03-05 ENCOUNTER — Encounter (INDEPENDENT_AMBULATORY_CARE_PROVIDER_SITE_OTHER): Payer: Self-pay | Admitting: Vascular Surgery

## 2024-03-10 ENCOUNTER — Other Ambulatory Visit: Payer: Self-pay | Admitting: *Deleted

## 2024-03-10 MED ORDER — ESTRADIOL 0.1 MG/GM VA CREA: TOPICAL_CREAM | VAGINAL | 2 refills | Status: AC

## 2024-03-10 NOTE — Telephone Encounter (Signed)
 Patient called and says she needs a refill of her estradiol .

## 2024-03-14 NOTE — Progress Notes (Signed)
 MRN : [id number]  Melanie Browning is a 83 y.o. (09/27/1941) female who presents with chief complaint of check circulation.  History of Present Illness:   The patient returns to the office for followup and review status post angiogram with intervention on 01/21/2024.    Procedure:  Percutaneous transluminal angioplasty and stent placement left SFA to 6 mm 2.    Percutaneous transluminal angioplasty and stent placement left tibioperoneal trunk and proximal posterior tibial artery with Esprit stents dilated to 3.5 mm distally and 4 mm proximally 3.     Introduction catheter into the venous branch of the failed saphenous vein bypass 4.    Coil embolization of the venous branch of the failed saphenous vein bypass using Ruby coils   The patient notes stability in the lower extremity symptoms. No interval shortening of the patient's claudication distance and her rest pain symptoms. No new ulcers or wounds have occurred since the last visit.   There have been no significant changes to the patient's overall health care.   No documented history of amaurosis fugax or recent TIA symptoms. There are no recent neurological changes noted. No documented history of DVT, PE or superficial thrombophlebitis. The patient denies recent episodes of angina or shortness of breath.    ABI's Rt=1.07 and Lt=1.14 (triphasic signal)  (previous ABI's Rt=1.11 and Lt=0.98)    No outpatient medications have been marked as taking for the 03/15/24 encounter (Appointment) with Jama, Cordella MATSU, MD.    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)    a.) multiple vascular interventions (PTA, stenting, arthrectomy, and  thrombectomy) of LEFT SFA, LEFT popliteal, LEFT posterior tibial, and LEFT anterior tibial arteries due to critical limb ischemia   CAD (coronary artery disease) 11/09/2020   a.) cCTA 11/09/2020: Ca2+ 539 (83rd %'ile for age/sex/race match control); <25% dRCA & pLCx, >70% pLAD; b.) LHC/PCI 11/15/2020: 90% pLAD (2.75 x 18 mm Resolute Onyx DES); c.) MV 06/26/2023: no sig ischemia   Cervical spondylosis    Colitis    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)    a.) s/p multiple vascular interventions   Diastolic dysfunction 06/26/2023   a.) TTE 06/26/2023: EF 60-65%, no RWMAs, G2DD, sev AoV thickening with mild AR, mild-mod AS (MPG 18.2 mmHg; AVA 0.73 cm2)   [place]   Dyslipidemia    History of cervical cancer    a.) s/p TAH   HPV (human papilloma virus) infection    Hyperlipidemia    Hypothyroidism    IGT (impaired glucose tolerance)    Left upper lobe pulmonary nodule    Long term current use of aspirin     Long term current use of ticagrelor  therapy    Macrocytic anemia    Osteoporosis    a.) Tx'd with zolodronic acid (Reclast) infusions  PAD (peripheral artery disease) (HCC)    Polymyalgia rheumatica (HCC)    Skin cancer of nose    Transaminitis    Vaginal dysplasia     Past Surgical History:  Procedure Laterality Date   COLONOSCOPY WITH PROPOFOL  N/A 10/04/2020   Procedure: COLONOSCOPY WITH PROPOFOL ;  Surgeon: Toledo, Ladell POUR, MD;  Location: [facility];  Service: Gastroenterology;  Laterality: N/A;   COLPOSCOPY     CORONARY STENT INTERVENTION N/A 11/15/2020   Procedure: CORONARY STENT INTERVENTION;  Surgeon: Florencio Cara BIRCH, MD;  Location: [facility];  Service: Cardiovascular;  Laterality: N/A;   FEMORAL-POPLITEAL BYPASS GRAFT Left 07/16/2023   Procedure: BYPASS GRAFT FEMORAL-POPLITEAL ARTERY (FEMORAL-POSTERIOR TIBIAL BYPASS W/ SAPHENOUS VEIN- [id number]);  Surgeon: Jama Cordella MATSU, MD;  Location: [facility];   Service: Vascular;  Laterality: Left;   KNEE ARTHROSCOPY Left    LAPAROSCOPIC HYSTERECTOMY     LEFT HEART CATH AND CORONARY ANGIOGRAPHY N/A 11/15/2020   Procedure: LEFT HEART CATH AND CORONARY ANGIOGRAPHY;  Surgeon: Florencio Cara BIRCH, MD;  Location: [facility];  Service: Cardiovascular;  Laterality: N/A;   LOWER EXTREMITY ANGIOGRAPHY Left 09/05/2020   Procedure: LOWER EXTREMITY ANGIOGRAPHY;  Surgeon: Jama Cordella MATSU, MD;  Location: [facility];  Service: Cardiovascular;  Laterality: Left;   LOWER EXTREMITY ANGIOGRAPHY Left 04/10/2021   Procedure: LOWER EXTREMITY ANGIOGRAPHY;  Surgeon: Jama Cordella MATSU, MD;  Location: [facility];  Service: Cardiovascular;  Laterality: Left;   LOWER EXTREMITY ANGIOGRAPHY Left 11/20/2021   Procedure: Lower Extremity Angiography;  Surgeon: Jama Cordella MATSU, MD;  Location: [facility];  Service: Cardiovascular;  Laterality: Left;   LOWER EXTREMITY ANGIOGRAPHY Left 06/05/2022   Procedure: Lower Extremity Angiography;  Surgeon: Jama Cordella MATSU, MD;  Location: [facility];  Service: Cardiovascular;  Laterality: Left;   LOWER EXTREMITY ANGIOGRAPHY Left 07/23/2022   Procedure: Lower Extremity Angiography;  Surgeon: Jama Cordella MATSU, MD;  Location: [facility];  Service: Cardiovascular;  Laterality: Left;   LOWER EXTREMITY ANGIOGRAPHY Left 12/27/2022   Procedure: Lower Extremity Angiography;  Surgeon: Jama Cordella MATSU, MD;  Location: [facility];  Service: Cardiovascular;  Laterality: Left;   LOWER EXTREMITY ANGIOGRAPHY Left 06/17/2023   Procedure: Lower Extremity Angiography;  Surgeon: Jama Cordella MATSU, MD;  Location: [facility];  Service: Cardiovascular;  Laterality: Left;   LOWER EXTREMITY ANGIOGRAPHY Left 01/20/2024   Procedure: Lower Extremity Angiography;  Surgeon: Jama Cordella MATSU, MD;  Location: [facility];  Service: Cardiovascular;  Laterality: Left;   LOWER  EXTREMITY ANGIOGRAPHY Left 01/21/2024   Procedure: Lower Extremity Angiography;  Surgeon: Jama Cordella MATSU, MD;  Location: [facility];  Service: Cardiovascular;  Laterality: Left;   LOWER EXTREMITY INTERVENTION Left 01/20/2024   Procedure: LOWER EXTREMITY INTERVENTION;  Surgeon: Jama Cordella MATSU, MD;  Location: [facility];  Service: Cardiovascular;  Laterality: Left;   TOTAL ABDOMINAL HYSTERECTOMY      Social History Social History   Tobacco Use   Smoking status: Never   Smokeless tobacco: Never  Vaping Use   Vaping status: Never Used  Substance Use Topics   Alcohol  use: Yes    Alcohol /week: 1.0 standard drink of alcohol     Types: 1 Glasses of wine per week    Comment: 5   Drug use: Never    Family History Family History  Problem Relation Age of Onset   Cancer Mother    Pancreatic cancer Mother  Heart failure Mother    Heart attack Father 63   Heart disease Father     Allergies  Allergen Reactions   Lactose Other (See Comments) and Nausea And Vomiting    Gi upset and diarrhea  Other reaction(s): Other (See Comments)  Gi upset and diarrhea  Other reaction(s): Other (See Comments) Gi upset and diarrhea Gi upset and diarrhea Gi upset and diarrhea    Gi upset and diarrhea    Gi upset and diarrhea Gi upset and diarrhea Gi upset and diarrhea  Gi upset and diarrhea    Gi upset and diarrhea  Other reaction(s): Other (See Comments) Gi upset and diarrhea Gi upset and diarrhea Gi upset and diarrhea  Gi upset and diarrhea  Gi upset and diarrhea Gi upset and diarrhea Gi upset and diarrhea     REVIEW OF SYSTEMS (Negative unless checked)  Constitutional: [] Weight loss  [] Fever  [] Chills Cardiac: [] Chest pain   [] Chest pressure   [] Palpitations   [] Shortness of breath when laying flat   [] Shortness of breath with exertion. Vascular:  [x] Pain in legs with walking   [] Pain in legs at rest  [] History of DVT   [] Phlebitis   [] Swelling in legs   [] Varicose  veins   [] Non-healing ulcers Pulmonary:   [] Uses home oxygen   [] Productive cough   [] Hemoptysis   [] Wheeze  [] COPD   [] Asthma Neurologic:  [] Dizziness   [] Seizures   [] History of stroke   [] History of TIA  [] Aphasia   [] Vissual changes   [] Weakness or numbness in arm   [] Weakness or numbness in leg Musculoskeletal:   [] Joint swelling   [] Joint pain   [] Low back pain Hematologic:  [] Easy bruising  [] Easy bleeding   [] Hypercoagulable state   [] Anemic Gastrointestinal:  [] Diarrhea   [] Vomiting  [] Gastroesophageal reflux/heartburn   [] Difficulty swallowing. Genitourinary:  [] Chronic kidney disease   [] Difficult urination  [] Frequent urination   [] Blood in urine Skin:  [] Rashes   [] Ulcers  Psychological:  [] History of anxiety   []  History of major depression.  Physical Examination  There were no vitals filed for this visit. There is no height or weight on file to calculate BMI. Gen: WD/WN, NAD Head: [place]/AT, No temporalis wasting.  Ear/Nose/Throat: Hearing grossly intact, nares w/o erythema or drainage Eyes: PER, EOMI, sclera nonicteric.  Neck: Supple, no masses.  No bruit or JVD.  Pulmonary:  Good air movement, no audible wheezing, no use of accessory muscles.  Cardiac: RRR, normal S1, S2, no Murmurs. Vascular:  mild trophic changes, no open wounds Vessel Right Left  Radial Palpable Palpable  PT Palpable Palpable  DP  Palpable Trace Palpable  Gastrointestinal: soft, non-distended. No guarding/no peritoneal signs.  Musculoskeletal: M/S 5/5 throughout.  No visible deformity.  Neurologic: CN 2-12 intact. Pain and light touch intact in extremities.  Symmetrical.  Speech is fluent. Motor exam as listed above. Psychiatric: Judgment intact, Mood & affect appropriate for pt's clinical situation. Dermatologic: No rashes or ulcers noted.  No changes consistent with cellulitis.   CBC Lab Results  Component Value Date   WBC 9.1 02/02/2024   HGB 9.1 (L) 02/02/2024   HCT 27.7 (L) 02/02/2024   MCV  92.0 02/02/2024   PLT 370 02/02/2024    BMET    Component Value Date/Time   NA 134 (L) 02/02/2024 0530   K 3.9 02/02/2024 0530   CL 104 02/02/2024 0530   CO2 21 (L) 02/02/2024 0530   GLUCOSE 93 02/02/2024 0530   BUN 15  02/02/2024 0530   CREATININE 0.65 02/02/2024 0530   CALCIUM  8.5 (L) 02/02/2024 0530   GFRNONAA >60 02/02/2024 0530   CrCl cannot be calculated (Patient's most recent lab result is older than the maximum 21 days allowed.).  COAG No results found for: INR, PROTIME  Radiology VAS US  ABI WITH/WO TBI Result Date: 02/16/2024  LOWER EXTREMITY DOPPLER STUDY Patient Name:  Melanie Browning  Date of Exam:   02/16/2024 Medical Rec #: [id number]      Accession #:    [id number] Date of Birth: Feb 02, 1941       Patient Gender: F Patient Age:   88 years Exam Location:  [facility] Procedure:      VAS US  ABI WITH/WO TBI Referring Phys: --------------------------------------------------------------------------------  Indications: Peripheral artery disease, and new claudication symptoms left              calf.  Vascular Interventions: 01/21/2024 eft lower extremity angiography third order                         catheter placement via existing 6 French sheath                         2. Percutaneous transluminal angioplasty and stent                         placement left SFA to 6 mm                         3. Percutaneous transluminal angioplasty and stent                         placement left tibioperoneal trunk and proximal                         posterior tibial artery with Esprit stents dilated to                         3.5 mm distally and 4 mm proximally                         4. Introduction catheter into the venous branch of the                         failed saphenous vein bypass                         5. Coil embolization of the venous branch of the failed                         saphenous vein bypass using Ruby coils                         07/16/2023                          left common femoral artery to posterior tibial artery                         bypass with composite in situ saphenous vein with a  spliced segment of reversed saphenous vein.                          12/27/2022 Percutaneous transluminal angioplasty and                         stent placement left superficial femoral and popliteal                         arteries to 6 mm                         4. Percutaneous transluminal angioplasty left posterior                         tibial and tibioperoneal trunk to 3.5 mm                         5. Atherectomy of the superficial femoral artery and                         popliteal artery using the Rota Rex catheter.                         6. Mechanical thrombectomy of the tibioperoneal trunk                         using the penumbra CAT 6 catheter.                         10/13/2019: Left popliteal artery PTA;                         10/14/2019: Left popliteal artery reconstruction;                         02/03/2020: Left popliteal artery angioplasty;                         04/10/2021: Left distal SFA/Popliteal Artery                         thrombectomy/PTA/stent;                          11/20/2021: PTA and Stent placement left SFA and                         Popliteal Artery. PTA Left tibioperoneal trunk and                         Posterior Tibial Artery.                         06/05/2022 Rota Rex mechanical thrombectomy of the left                         SFA and popliteal                         3. Percutaneous transluminal angioplasty and  stent                         placement left superficial femoral and popliteal                         arteries to 5 mm maximally                         4. Percutaneous transluminal angioplasty left posterior                         tibial to approximately 3.5 mm. Comparison Study: 11/2023 Performing Technologist: Jerel Croak RVT  Examination Guidelines: A complete evaluation includes at minimum, Doppler  waveform signals and systolic blood pressure reading at the level of bilateral brachial, anterior tibial, and posterior tibial arteries, when vessel segments are accessible. Bilateral testing is considered an integral part of a complete examination. Photoelectric Plethysmograph (PPG) waveforms and toe systolic pressure readings are included as required and additional duplex testing as needed. Limited examinations for reoccurring indications may be performed as noted.  ABI Findings: +---------+------------------+-----+---------+--------+ Right    Rt Pressure (mmHg)IndexWaveform Comment  +---------+------------------+-----+---------+--------+ Brachial 139                                      +---------+------------------+-----+---------+--------+ PTA      150               1.07 triphasic         +---------+------------------+-----+---------+--------+ DP       148               1.06 triphasic         +---------+------------------+-----+---------+--------+ Great Toe134               0.96 Normal            +---------+------------------+-----+---------+--------+ +---------+------------------+-----+---------+-------+ Left     Lt Pressure (mmHg)IndexWaveform Comment +---------+------------------+-----+---------+-------+ Brachial 140                                     +---------+------------------+-----+---------+-------+ PTA      159               1.14 triphasic        +---------+------------------+-----+---------+-------+ DP       149               1.06 biphasic         +---------+------------------+-----+---------+-------+ Great Toe138               0.99 Normal           +---------+------------------+-----+---------+-------+ +-------+-----------+-----------+------------+------------+ ABI/TBIToday's ABIToday's TBIPrevious ABIPrevious TBI +-------+-----------+-----------+------------+------------+ Right  1.07       .96        1.11        1.00          +-------+-----------+-----------+------------+------------+ Left   1.14       .99        .98         .92          +-------+-----------+-----------+------------+------------+ Left ABIs and TBIs appear increased.  *See table(s) above for measurements and observations.  Electronically signed by Cordella Shawl MD on 02/16/2024 at 4:56:56  PM.    Final      Assessment/Plan 1. Popliteal artery injury, left, sequela (Primary) Recommend:   The patient is status post successful angiogram with intervention.  The patient reports that the claudication symptoms and leg pain has improved.   The patient denies lifestyle limiting changes at this point in time.   No further invasive studies, angiography or surgery at this time. The patient should continue walking and begin a more formal exercise program.  The patient should continue antiplatelet therapy and aggressive treatment of the lipid abnormalities   Continued surveillance is indicated as atherosclerosis is likely to progress with time.     Patient should undergo noninvasive studies as ordered. The patient will follow up with me to review the studies.  More frequent monitoring is certainly justified considering all the recurrences that we have struggled with.  She is to see vascular surgery at the [facility] later this week.  She will follow-up with me in 1 month to review the findings.   2. Chronic venous insufficiency No surgery or intervention at this point in time.   The patient is CEAP [id number]    I have discussed with the patient venous insufficiency and why it  causes symptoms. I have discussed with the patient the chronic skin changes that accompany venous insufficiency and the long term sequela such as infection and ulceration.  Patient will begin wearing graduated compression stockings or compression wraps on a daily basis.  The patient will put the compression on first thing in the morning and removing them in the evening. The  patient is instructed specifically not to sleep in the compression.     In addition, behavioral modification including several periods of elevation of the lower extremities during the day will be continued. I have demonstrated that proper elevation is a position with the ankles at heart level.   The patient is instructed to begin routine exercise, especially walking on a daily basis  3. Coronary artery disease of native artery of native heart with stable angina pectoris (HCC) Continue cardiac and antihypertensive medications as already ordered and reviewed, no changes at this time.  Continue statin as ordered and reviewed, no changes at this time  Nitrates PRN for chest pain  4. Paroxysmal atrial fibrillation (HCC) Continue antiarrhythmia medications as already ordered, these medications have been reviewed and there are no changes at this time.  Continue anticoagulation as ordered by Cardiology Service  5. Mixed hyperlipidemia Continue statin as ordered and reviewed, no changes at this time    Cordella Shawl, MD  03/14/2024 2:42 PM

## 2024-03-15 ENCOUNTER — Encounter (INDEPENDENT_AMBULATORY_CARE_PROVIDER_SITE_OTHER): Payer: Self-pay | Admitting: Vascular Surgery

## 2024-03-15 ENCOUNTER — Ambulatory Visit (INDEPENDENT_AMBULATORY_CARE_PROVIDER_SITE_OTHER): Admitting: Vascular Surgery

## 2024-03-15 VITALS — BP 135/80 | HR 91 | Resp 18 | Ht 68.0 in | Wt 135.4 lb

## 2024-03-15 DIAGNOSIS — I480 Paroxysmal atrial fibrillation: Secondary | ICD-10-CM | POA: Diagnosis not present

## 2024-03-15 DIAGNOSIS — I872 Venous insufficiency (chronic) (peripheral): Secondary | ICD-10-CM | POA: Diagnosis not present

## 2024-03-15 DIAGNOSIS — I25118 Atherosclerotic heart disease of native coronary artery with other forms of angina pectoris: Secondary | ICD-10-CM

## 2024-03-15 DIAGNOSIS — E782 Mixed hyperlipidemia: Secondary | ICD-10-CM

## 2024-03-15 DIAGNOSIS — S85002S Unspecified injury of popliteal artery, left leg, sequela: Secondary | ICD-10-CM

## 2024-03-20 ENCOUNTER — Encounter (INDEPENDENT_AMBULATORY_CARE_PROVIDER_SITE_OTHER): Payer: Self-pay | Admitting: Vascular Surgery

## 2024-03-22 ENCOUNTER — Encounter (INDEPENDENT_AMBULATORY_CARE_PROVIDER_SITE_OTHER): Payer: Self-pay | Admitting: Vascular Surgery

## 2024-03-23 ENCOUNTER — Encounter (INDEPENDENT_AMBULATORY_CARE_PROVIDER_SITE_OTHER): Payer: Self-pay | Admitting: Vascular Surgery

## 2024-03-23 ENCOUNTER — Telehealth (INDEPENDENT_AMBULATORY_CARE_PROVIDER_SITE_OTHER): Payer: Self-pay

## 2024-03-23 NOTE — Telephone Encounter (Signed)
 This is from Avelina Gully through patient advice.  Dr. Ambani plans to contact you today (f he hasn't already).    1.  Dr.Ambani praises your good surgical skills.  Tests reveal no blockages at this time.   2.  With the failure of fem-pop 6 months ago, the patency of the result is reduced by 50%.  Therefore it is important to more closely monitor the situation by more frequent ultra sounds to identify any slight changes in blood flow before I recognize the blockage.  He suggests monthly ultra sound.   3. if another critical situation occurs (like the most recent hospitalization) he  says the only solution is another by-pass surgery, using a single length plastic replacement for native vein.  Have you done this procedure before?  He is willing to do the surgery if & when it is necessary, but travel time is an obstacle.  He considers my condition urgently needs proactive attention to save my leg.   4.  He offered no advice for why this has happened.   He also thinks the [facility] program will not be beneficial for my condition.     If you need the 2 test results documents, let me know, & I will bring to your office.  I could only save them as html and cannot attach to this message.   Thanks, Bruna

## 2024-03-24 DIAGNOSIS — M1812 Unilateral primary osteoarthritis of first carpometacarpal joint, left hand: Secondary | ICD-10-CM | POA: Insufficient documentation

## 2024-04-12 ENCOUNTER — Ambulatory Visit (INDEPENDENT_AMBULATORY_CARE_PROVIDER_SITE_OTHER): Admitting: Vascular Surgery

## 2024-04-12 ENCOUNTER — Encounter (INDEPENDENT_AMBULATORY_CARE_PROVIDER_SITE_OTHER): Payer: Self-pay | Admitting: Vascular Surgery

## 2024-04-12 ENCOUNTER — Other Ambulatory Visit (INDEPENDENT_AMBULATORY_CARE_PROVIDER_SITE_OTHER): Payer: Self-pay | Admitting: Vascular Surgery

## 2024-04-12 ENCOUNTER — Ambulatory Visit (INDEPENDENT_AMBULATORY_CARE_PROVIDER_SITE_OTHER)

## 2024-04-12 VITALS — BP 123/62 | HR 69 | Resp 18 | Ht 68.0 in | Wt 132.6 lb

## 2024-04-12 DIAGNOSIS — R0989 Other specified symptoms and signs involving the circulatory and respiratory systems: Secondary | ICD-10-CM | POA: Diagnosis not present

## 2024-04-12 DIAGNOSIS — Z9889 Other specified postprocedural states: Secondary | ICD-10-CM

## 2024-04-12 DIAGNOSIS — I25118 Atherosclerotic heart disease of native coronary artery with other forms of angina pectoris: Secondary | ICD-10-CM | POA: Diagnosis not present

## 2024-04-12 DIAGNOSIS — I480 Paroxysmal atrial fibrillation: Secondary | ICD-10-CM | POA: Diagnosis not present

## 2024-04-12 DIAGNOSIS — I739 Peripheral vascular disease, unspecified: Secondary | ICD-10-CM | POA: Diagnosis not present

## 2024-04-12 DIAGNOSIS — I70212 Atherosclerosis of native arteries of extremities with intermittent claudication, left leg: Secondary | ICD-10-CM | POA: Diagnosis not present

## 2024-04-12 DIAGNOSIS — I872 Venous insufficiency (chronic) (peripheral): Secondary | ICD-10-CM

## 2024-04-12 DIAGNOSIS — S85002S Unspecified injury of popliteal artery, left leg, sequela: Secondary | ICD-10-CM

## 2024-04-12 NOTE — Progress Notes (Signed)
 MRN : [id number]  Melanie Browning is a 83 y.o. (27-Jan-1941) female who presents with chief complaint of check circulation.  History of Present Illness:   The patient returns to the office for followup and review status post angiogram with intervention on 01/21/2024.    Procedure:  Percutaneous transluminal angioplasty and stent placement left SFA to 6 mm 2.    Percutaneous transluminal angioplasty and stent placement left tibioperoneal trunk and proximal posterior tibial artery with Esprit stents dilated to 3.5 mm distally and 4 mm proximally 3.     Introduction catheter into the venous branch of the failed saphenous vein bypass 4.    Coil embolization of the venous branch of the failed saphenous vein bypass using Ruby coils   The patient notes stability in the lower extremity symptoms. No interval shortening of the patient's claudication distance and her rest pain symptoms.  She notes that she is walking her dog without difficulty and without symptoms.  No new ulcers or wounds have occurred since the last visit.   There have been no significant changes to the patient's overall health care.   No documented history of amaurosis fugax or recent TIA symptoms. There are no recent neurological changes noted. No documented history of DVT, PE or superficial thrombophlebitis. The patient denies recent episodes of angina or shortness of breath.   Previous ABI's Rt=1.07 and Lt=1.14 (triphasic signal) (previous ABI's Rt=1.11 and Lt=0.98)   (Because I had difficulty palpating the left posterior tibial pulse I obtained this study urgently) Duplex ultrasound obtained today stat demonstrates uniform flows no evidence of a hemodynamically significant recurrent stenosis.  Arterial system is widely patent.  Current Meds  Medication Sig   amiodarone  (PACERONE ) 200 MG tablet Take 1 tablet (200 mg total) by mouth daily.   apixaban   (ELIQUIS ) 5 MG TABS tablet Take 1 tablet (5 mg total) by mouth 2 (two) times daily.   B Complex Vitamins (VITAMIN B-COMPLEX) TABS Take 1 tablet by mouth daily.   budesonide  (ENTOCORT EC ) 3 MG 24 hr capsule Take 3 capsules (9 mg total) by mouth every morning.   diphenoxylate -atropine  (LOMOTIL ) 2.5-0.025 MG tablet TAKE 1 TABLET BY MOUTH 3 TIMES DAILY AS NEEDED FOR DIARRHEA.   estradiol  (ESTRACE ) 0.1 MG/GM vaginal cream Insert 1/4 applicator full into vagina at night twice a week   levothyroxine  (SYNTHROID ) 75 MCG tablet Take 75 mcg by mouth daily before breakfast.    losartan  (COZAAR ) 25 MG tablet Take 25 mg by mouth daily.   losartan  (COZAAR ) 25 MG tablet Take 1 tablet by mouth daily.   rosuvastatin  (CRESTOR ) 5 MG tablet Take 1 tablet (5 mg total) by mouth daily.   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)    a.) multiple vascular interventions (PTA, stenting, arthrectomy,  and thrombectomy) of LEFT SFA, LEFT popliteal, LEFT posterior tibial, and LEFT anterior tibial arteries due to critical limb ischemia   CAD (coronary artery disease) 11/09/2020   a.) cCTA 11/09/2020: Ca2+ 539 (83rd %'ile for age/sex/race match control); <25% dRCA & pLCx, >70% pLAD; b.) LHC/PCI 11/15/2020: 90% pLAD (2.75 x 18 mm Resolute Onyx DES); c.) MV 06/26/2023: no sig ischemia   Cervical spondylosis    Colitis    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)    a.) s/p multiple vascular interventions   Diastolic dysfunction 06/26/2023   a.) TTE 06/26/2023: EF 60-65%, no RWMAs, G2DD, sev AoV thickening with mild AR, mild-mod AS (MPG 18.2 mmHg; AVA 0.73 cm2)   [place]    Dyslipidemia    History of cervical cancer    a.) s/p TAH   HPV (human papilloma virus) infection    Hyperlipidemia    Hypothyroidism    IGT (impaired glucose tolerance)    Left upper lobe pulmonary nodule    Long term current use of aspirin     Long term current use of ticagrelor  therapy    Macrocytic anemia    Osteoporosis    a.) Tx'd with zolodronic acid (Reclast) infusions   PAD (peripheral artery disease) (HCC)    Polymyalgia rheumatica (HCC)    Skin cancer of nose    Transaminitis    Vaginal dysplasia     Past Surgical History:  Procedure Laterality Date   COLONOSCOPY WITH PROPOFOL  N/A 10/04/2020   Procedure: COLONOSCOPY WITH PROPOFOL ;  Surgeon: Toledo, Ladell POUR, MD;  Location: [facility];  Service: Gastroenterology;  Laterality: N/A;   COLPOSCOPY     CORONARY STENT INTERVENTION N/A 11/15/2020   Procedure: CORONARY STENT INTERVENTION;  Surgeon: Florencio Cara BIRCH, MD;  Location: [facility];  Service: Cardiovascular;  Laterality: N/A;   FEMORAL-POPLITEAL BYPASS GRAFT Left 07/16/2023   Procedure: BYPASS GRAFT FEMORAL-POPLITEAL ARTERY (FEMORAL-POSTERIOR TIBIAL BYPASS W/ SAPHENOUS VEIN- [id number]);  Surgeon: Jama Cordella MATSU, MD;  Location: [facility];  Service: Vascular;  Laterality: Left;   KNEE ARTHROSCOPY Left    LAPAROSCOPIC HYSTERECTOMY     LEFT HEART CATH AND CORONARY ANGIOGRAPHY N/A 11/15/2020   Procedure: LEFT HEART CATH AND CORONARY ANGIOGRAPHY;  Surgeon: Florencio Cara BIRCH, MD;  Location: [facility];  Service: Cardiovascular;  Laterality: N/A;   LOWER EXTREMITY ANGIOGRAPHY Left 09/05/2020   Procedure: LOWER EXTREMITY ANGIOGRAPHY;  Surgeon: Jama Cordella MATSU, MD;  Location: [facility];  Service: Cardiovascular;  Laterality: Left;   LOWER EXTREMITY ANGIOGRAPHY Left 04/10/2021   Procedure: LOWER EXTREMITY ANGIOGRAPHY;  Surgeon: Jama Cordella MATSU, MD;  Location: [facility];  Service: Cardiovascular;  Laterality: Left;   LOWER  EXTREMITY ANGIOGRAPHY Left 11/20/2021   Procedure: Lower Extremity Angiography;  Surgeon: Jama Cordella MATSU, MD;  Location: [facility];  Service: Cardiovascular;  Laterality: Left;   LOWER EXTREMITY ANGIOGRAPHY Left 06/05/2022   Procedure: Lower Extremity Angiography;  Surgeon: Jama Cordella MATSU, MD;  Location: [facility];  Service: Cardiovascular;  Laterality: Left;   LOWER EXTREMITY ANGIOGRAPHY Left 07/23/2022   Procedure: Lower Extremity Angiography;  Surgeon: Jama Cordella MATSU, MD;  Location: [facility];  Service: Cardiovascular;  Laterality: Left;   LOWER EXTREMITY ANGIOGRAPHY Left 12/27/2022   Procedure: Lower Extremity Angiography;  Surgeon: Jama Cordella MATSU, MD;  Location: [facility];  Service: Cardiovascular;  Laterality: Left;   LOWER EXTREMITY  ANGIOGRAPHY Left 06/17/2023   Procedure: Lower Extremity Angiography;  Surgeon: Jama Cordella MATSU, MD;  Location: [facility] INV[facility] CV LAB;  Service: Cardiovascular;  Laterality: Left;   LOWER EXTREMITY ANGIOGRAPHY Left 01/20/2024   Procedure: Lower Extremity Angiography;  Surgeon: Jama Cordella MATSU, MD;  Location: [facility];  Service: Cardiovascular;  Laterality: Left;   LOWER EXTREMITY ANGIOGRAPHY Left 01/21/2024   Procedure: Lower Extremity Angiography;  Surgeon: Jama Cordella MATSU, MD;  Location: [facility];  Service: Cardiovascular;  Laterality: Left;   LOWER EXTREMITY INTERVENTION Left 01/20/2024   Procedure: LOWER EXTREMITY INTERVENTION;  Surgeon: Jama Cordella MATSU, MD;  Location: [facility];  Service: Cardiovascular;  Laterality: Left;   TOTAL ABDOMINAL HYSTERECTOMY      Social History Social History   Tobacco Use   Smoking status: Never   Smokeless tobacco: Never  Vaping Use   Vaping status: Never Used  Substance Use Topics   Alcohol  use: Yes    Alcohol /week: 1.0 standard drink of alcohol     Types: 1 Glasses of wine per week    Comment: 5   Drug use:  Never    Family History Family History  Problem Relation Age of Onset   Cancer Mother    Pancreatic cancer Mother    Heart failure Mother    Heart attack Father 14   Heart disease Father     Allergies  Allergen Reactions   Lactose Other (See Comments) and Nausea And Vomiting    Gi upset and diarrhea  Other reaction(s): Other (See Comments)  Gi upset and diarrhea  Other reaction(s): Other (See Comments) Gi upset and diarrhea Gi upset and diarrhea Gi upset and diarrhea    Gi upset and diarrhea    Gi upset and diarrhea Gi upset and diarrhea Gi upset and diarrhea  Gi upset and diarrhea    Gi upset and diarrhea  Other reaction(s): Other (See Comments) Gi upset and diarrhea Gi upset and diarrhea Gi upset and diarrhea  Gi upset and diarrhea  Gi upset and diarrhea Gi upset and diarrhea Gi upset and diarrhea     REVIEW OF SYSTEMS (Negative unless checked)  Constitutional: [] Weight loss  [] Fever  [] Chills Cardiac: [] Chest pain   [] Chest pressure   [] Palpitations   [] Shortness of breath when laying flat   [] Shortness of breath with exertion. Vascular:  [x] Pain in legs with walking   [] Pain in legs at rest  [] History of DVT   [] Phlebitis   [] Swelling in legs   [] Varicose veins   [] Non-healing ulcers Pulmonary:   [] Uses home oxygen   [] Productive cough   [] Hemoptysis   [] Wheeze  [] COPD   [] Asthma Neurologic:  [] Dizziness   [] Seizures   [] History of stroke   [] History of TIA  [] Aphasia   [] Vissual changes   [] Weakness or numbness in arm   [] Weakness or numbness in leg Musculoskeletal:   [] Joint swelling   [] Joint pain   [] Low back pain Hematologic:  [] Easy bruising  [] Easy bleeding   [] Hypercoagulable state   [] Anemic Gastrointestinal:  [] Diarrhea   [] Vomiting  [] Gastroesophageal reflux/heartburn   [] Difficulty swallowing. Genitourinary:  [] Chronic kidney disease   [] Difficult urination  [] Frequent urination   [] Blood in urine Skin:  [] Rashes   [] Ulcers  Psychological:  [] History of  anxiety   []  History of major depression.  Physical Examination  Vitals:   04/12/24 1327  BP: 123/62  Pulse: 69  Resp: 18  Weight: 132 lb 9.6 oz (60.1 kg)  Height: 5' 8 (1.727 m)   Body mass index is 20.16 kg/m. Gen: WD/WN, NAD Head: [place]/AT, No temporalis wasting.  Ear/Nose/Throat: Hearing grossly intact, nares w/o erythema or drainage Eyes: PER, EOMI, sclera nonicteric.  Neck: Supple, no masses.  No bruit or JVD.  Pulmonary:  Good air movement, no audible wheezing, no use of accessory muscles.  Cardiac: RRR, normal S1, S2, no Murmurs. Vascular:  mild trophic changes, no open wounds Vessel Right Left  Radial Palpable Palpable  PT Not Palpable Not Palpable  DP Not Palpable Not Palpable  Gastrointestinal: soft, non-distended. No guarding/no peritoneal signs.  Musculoskeletal: M/S 5/5 throughout.  No visible deformity.  Neurologic: CN 2-12 intact. Pain and light touch intact in extremities.  Symmetrical.  Speech is fluent. Motor exam as listed above. Psychiatric: Judgment intact, Mood & affect appropriate for pt's clinical situation. Dermatologic: No rashes or ulcers noted.  No changes consistent with cellulitis.   CBC Lab Results  Component Value Date   WBC 9.1 02/02/2024   HGB 9.1 (L) 02/02/2024   HCT 27.7 (L) 02/02/2024   MCV 92.0 02/02/2024   PLT 370 02/02/2024    BMET    Component Value Date/Time   NA 134 (L) 02/02/2024 0530   K 3.9 02/02/2024 0530   CL 104 02/02/2024 0530   CO2 21 (L) 02/02/2024 0530   GLUCOSE 93 02/02/2024 0530   BUN 15 02/02/2024 0530   CREATININE 0.65 02/02/2024 0530   CALCIUM  8.5 (L) 02/02/2024 0530   GFRNONAA >60 02/02/2024 0530   CrCl cannot be calculated (Patient's most recent lab result is older than the maximum 21 days allowed.).  COAG No results found for: INR, PROTIME  Radiology No results found.   Assessment/Plan 1. 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  The patient reports that the claudication symptoms and leg pain has improved.   The patient denies lifestyle limiting changes at this point in time.   No further invasive studies, angiography or surgery at this time. The patient should continue walking and begin a more formal exercise program.  The patient should continue antiplatelet therapy and aggressive treatment of the lipid abnormalities   Continued surveillance is indicated as atherosclerosis is likely to progress with time.     Patient should undergo noninvasive studies as ordered. The patient will follow up with me to review the studies.  More frequent monitoring is certainly justified considering all the recurrences that we have struggled with.   She is to see functional medicine at the [facility] clinic later this month.  She will follow-up with me in 1 month to review the findings. - VAS US  LOWER EXTREMITY ARTERIAL DUPLEX; Future - VAS US  ABI WITH/WO TBI; Future  2. Popliteal artery injury, left, sequela Recommend:   The patient is status post successful angiogram with intervention.  The patient reports that the claudication symptoms and leg pain has improved.   The patient denies lifestyle limiting changes at this point in time.   No further invasive studies, angiography or surgery at this time. The patient should continue walking and begin a more formal exercise program.  The patient should continue antiplatelet therapy and aggressive treatment of the lipid abnormalities   Continued surveillance is indicated as atherosclerosis is likely to progress with time.     Patient should undergo noninvasive studies as ordered. The patient will follow up with me to review the  studies.  More frequent monitoring is certainly justified considering all the recurrences that we have struggled with.   She is to see functional medicine at the  [facility] clinic later this month.  She will follow-up with me in 1 month to review the findings.  3. Chronic venous insufficiency No surgery or intervention at this point in time.   The patient is CEAP [id number]    I have discussed with the patient venous insufficiency and why it  causes symptoms. I have discussed with the patient the chronic skin changes that accompany venous insufficiency and the long term sequela such as infection and ulceration.  Patient will begin wearing graduated compression stockings or compression wraps on a daily basis.  The patient will put the compression on first thing in the morning and removing them in the evening. The patient is instructed specifically not to sleep in the compression.     In addition, behavioral modification including several periods of elevation of the lower extremities during the day will be continued. I have demonstrated that proper elevation is a position with the ankles at heart level.   The patient is instructed to begin routine exercise, especially walking on a daily basis  4. Coronary artery disease of native artery of native heart with stable angina pectoris (HCC) Continue cardiac and antihypertensive medications as already ordered and reviewed, no changes at this time.  Continue statin as ordered and reviewed, no changes at this time  Nitrates PRN for chest pain  5. Paroxysmal atrial fibrillation (HCC) Continue antiarrhythmia medications as already ordered, these medications have been reviewed and there are no changes at this time.  Continue anticoagulation as ordered by Cardiology Service    Cordella Shawl, MD  04/12/2024 2:47 PM

## 2024-04-14 ENCOUNTER — Ambulatory Visit: Payer: Medicare Other

## 2024-04-17 ENCOUNTER — Encounter (INDEPENDENT_AMBULATORY_CARE_PROVIDER_SITE_OTHER): Payer: Self-pay | Admitting: Vascular Surgery

## 2024-04-21 ENCOUNTER — Encounter: Payer: Self-pay | Admitting: Obstetrics and Gynecology

## 2024-04-21 ENCOUNTER — Inpatient Hospital Stay: Attending: Obstetrics and Gynecology | Admitting: Obstetrics and Gynecology

## 2024-04-21 VITALS — BP 156/60 | HR 62 | Temp 98.6°F | Resp 20 | Wt 132.8 lb

## 2024-04-21 DIAGNOSIS — N893 Dysplasia of vagina, unspecified: Secondary | ICD-10-CM | POA: Diagnosis not present

## 2024-04-21 DIAGNOSIS — Z9079 Acquired absence of other genital organ(s): Secondary | ICD-10-CM | POA: Diagnosis not present

## 2024-04-21 DIAGNOSIS — Z90722 Acquired absence of ovaries, bilateral: Secondary | ICD-10-CM | POA: Diagnosis not present

## 2024-04-21 DIAGNOSIS — B977 Papillomavirus as the cause of diseases classified elsewhere: Secondary | ICD-10-CM | POA: Diagnosis not present

## 2024-04-21 DIAGNOSIS — Z90710 Acquired absence of both cervix and uterus: Secondary | ICD-10-CM | POA: Insufficient documentation

## 2024-04-21 NOTE — Progress Notes (Signed)
 GYN ONC Interval [facility]  [phone] [phone]  Patient Care Team: Epifanio Alm SQUIBB, MD as PCP - General (Infectious Diseases) Gollan, Evalene PARAS, MD as PCP - Cardiology (Cardiology) Maurie Rayfield BIRCH, RN as Oncology Nurse Navigator   Name of the patient: Melanie Browning  [id number]  [birth year]   Date of visit: 04/21/2024  Gynecologic Oncology Interval Visit   Referring Provider: Dr. 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 She is s/p TAH-BSO on 09/05/2015, but final surgical path was negative for dysplasia.  After surgery, she was closely monitored every 6 months with colposcopies and PAPs.   PAP 6/24 NILM, HPV negative.   Returns today for repeat exam with Pap. No new complaints. Still using vaginal estrogen twice a week.  Fem-pop bypass on left leg in October, but that failed and she had more surgery with stents placed.  Takes Eliquis  and Brilinta  bid in view of prior thromboses.   Gynecologic Oncology History Melanie Browning is a pleasant G1P1 female who is seen in consultation from Dr. Epifanio for dysplasia. She is s/p TAH-BSO on 09/05/2015, but final surgical path was negative for dysplasia.  After surgery, she was closely monitored every 6 months with colposcopies and PAPs. Prior treatment in [place]  for cervical and vaginal dysplasia from 2016 to 08/2019.   9/16 LSIL PAP, HR HPV+, Colposcopic biopsies 10/16, CIN1 on ectocervix, ECC negative. 11/16 [facility] biopsy LSIL, clear margins. ECC negative.  09/05/2015 TLH/BSO Dr Thom Donalds in [facility] for abnormal PAP. No residual disease in specimen. PAP 08/2016 LGSIL  PAP 02/09/2017 HSIL Colposcopy 02/2017 VAIN 1 PAP 05/2017 NILM 10/30/2017 ASCUS, HPV 16+ Colposcopy 11/26/2017 VAIN 1 Colposcopy 03/17/2018 No obvious disease noted (as per note from Dr.  Myra on 09/28/2019)  PAP 03/25/2019 LSIL/HPV+ Colposcopy with biopsy 04/20/2019 VAIN 1  PAP 09/21/2019, patient reports VAIN 1, HPV+ Colposcopy 7/21 normal PAP 10/25/20 HSIL, +HR HPV PAP 10/25/20 HSIL, -HR HPV  PAP 10/25/20 HSIL, +HR HPV Reported no gyn symptoms.   Colposcopy 2/22 negative We discussed options for management including surveillance versus Efudex  treatment.  She was anxious and preferred treatment.  We prescribed Efudex  once a week for 10 weeks and then RTC in 1-2 months for repeat PAP. She completed this course with some interruptions due to vulvar irritation despite skin protectants.   PAP 02/28/21 HSIL, HR HPV- No bleeding or discharge, slight odor.  Repeat Pap 04/18/21- ASC-H; cannot exclude high grade squamous intraepithelial.   Recommended she use Estradiol  cream to improve health of vaginal mucosa prior to repeat colposcopy.    06/06/2021 Biopsy to evaluate abnormal Pap  DIAGNOSIS:  A. VAGINA; BIOPSY:  - MINUTE FRAGMENT OF VAGINAL MUCOSA WITH PARTIALLY DETACHED EPITHELIUM  AND AT LEAST LOW-GRADE SQUAMOUS INTRAEPITHELIAL LESION (VAIN 1, MILD  DYSPLASIA).  - NEGATIVE FOR DEFINITE HIGH-GRADE SQUAMOUS INTRAEPITHELIAL LESION AND MALIGNANCY.   12/05/21 PAP HSIL/HPV+ Colposcopy 3/23 with lesion in left fornix, but path did not show significant lesion.   VAGINAL FORNIX, LEFT; BIOPSY:  - 1 FRAGMENT OF ATYPICAL SQUAMOUS MUCOSA ASSOCIATED WITH POSSIBLE  PREVIOUS BIOPSY SITE REACTION.  - SEPARATE FRAGMENT OF ATROPHIC VAGINAL MUCOSA.  05/08/2022 HGSIL vaginal pap; HRHPV positive. She using the estrogen vaginal therapy twice a week since recommended in August.  Follow up exam, PAP, and colposcopy vaginal biopsy 12/23 showed LSIL/VAIN1.   Other pertinent medical issues She  had stent placed a few days ago in her leg due to artery narrowing.  On Plavix  and ASA.  Small blood blister below right groin where vascular access was obtained. Bruises easily, but no bleeding.   She had  colonoscopy for follow up of Lymphocytic colitis and that was stable.   Also having problems with intermittent claudication in left leg and underwent vascular percutaneous procedures including a popliteal stent in 12/21.  She is taking Plavix  and ASA.   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   Muscle spasms of neck 04/06/2022   Cervical spondylosis 04/06/2022   Acute pain of left wrist 02/14/2022   Atherosclerosis of native arteries of extremity with rest pain (HCC) 11/20/2021   CAD (coronary artery disease) 12/10/2020   Coronary artery disease involving native coronary artery of native heart 11/30/2020   S/P drug eluting coronary stent placement 11/15/2020   Abdominal pain, LLQ (left lower quadrant) 09/28/2020   Long term current use of anticoagulant therapy 09/28/2020   Functional diarrhea 09/28/2020   Chronic venous insufficiency 04/23/2020   PAD (peripheral artery disease) (HCC) 04/19/2020   Hyperlipidemia 04/19/2020   Hypothyroidism 04/19/2020   Popliteal artery injury, left, 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   Disruption of external operation (surgical) wound, not elsewhere classified, initial encounter 11/09/2019   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) 10/29/2019   Stricture, artery (HCC) 10/29/2019   Critical lower Browning ischemia (HCC) 10/13/2019   Hypothyroidism due to acquired atrophy of thyroid  04/07/2019   Syncope and collapse 04/07/2019    Senile osteoporosis 09/20/2016   Sleep disorder 07/18/2016   IGT (impaired glucose tolerance) 06/26/2015   Pap smear abnormality of cervix with ASCUS favoring dysplasia 06/26/2015   Family history of Guillain-Barre syndrome 04/26/2015   History of herpes genitalis 04/26/2015   History of HPV infection 04/26/2015   Dyslipidemia 04/17/2015   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   PMR (polymyalgia rheumatica) (HCC) 04/17/2015   Arthritis of shoulder region, 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   Past 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)    a.) multiple vascular interventions (PTA, stenting, arthrectomy, and thrombectomy) of LEFT SFA, LEFT popliteal, LEFT posterior tibial, and LEFT anterior tibial arteries due to critical Browning ischemia   CAD (coronary artery disease) 11/09/2020   a.) cCTA 11/09/2020: Ca2+ 539 (83rd %'ile for age/sex/race match control); <25% dRCA & pLCx, >70% pLAD; b.) LHC/PCI 11/15/2020: 90% pLAD (2.75 x 18 mm Resolute Onyx DES); c.) MV 06/26/2023: no sig ischemia   Cervical spondylosis    Colitis    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)    a.) s/p multiple vascular interventions   Diastolic dysfunction 06/26/2023   a.) TTE 06/26/2023: EF 60-65%, no RWMAs, G2DD, sev AoV thickening with mild  AR, mild-mod AS (MPG 18.2 mmHg; AVA 0.73 cm2)   [place]   Dyslipidemia    History of cervical cancer    a.) s/p TAH   HPV (human papilloma virus) infection    Hyperlipidemia    Hypothyroidism    IGT (impaired glucose tolerance)    Left upper lobe pulmonary nodule    Long term current use of aspirin     Long term current use of ticagrelor  therapy    Macrocytic anemia     Osteoporosis    a.) Tx'd with zolodronic acid (Reclast) infusions   PAD (peripheral artery disease) (HCC)    Polymyalgia rheumatica (HCC)    Skin cancer of nose    Transaminitis    Vaginal dysplasia    Past Surgical History: Past Surgical History:  Procedure Laterality Date   COLONOSCOPY WITH PROPOFOL  N/A 10/04/2020   Procedure: COLONOSCOPY WITH PROPOFOL ;  Surgeon: Toledo, Ladell POUR, MD;  Location: [facility] END[facility];  Service: Gastroenterology;  Laterality: N/A;   COLPOSCOPY     CORONARY STENT INTERVENTION N/A 11/15/2020   Procedure: CORONARY STENT INTERVENTION;  Surgeon: Florencio Cara BIRCH, MD;  Location: [facility];  Service: Cardiovascular;  Laterality: N/A;   FEMORAL-POPLITEAL BYPASS GRAFT Left 07/16/2023   Procedure: BYPASS GRAFT FEMORAL-POPLITEAL ARTERY (FEMORAL-POSTERIOR TIBIAL BYPASS W/ SAPHENOUS VEIN- [id number]);  Surgeon: Jama Cordella MATSU, MD;  Location: [facility] ORS;  Service: Vascular;  Laterality: Left;   KNEE ARTHROSCOPY Left    LAPAROSCOPIC HYSTERECTOMY     LEFT HEART CATH AND CORONARY ANGIOGRAPHY N/A 11/15/2020   Procedure: LEFT HEART CATH AND CORONARY ANGIOGRAPHY;  Surgeon: Florencio Cara BIRCH, MD;  Location: [facility];  Service: Cardiovascular;  Laterality: N/A;   LOWER EXTREMITY ANGIOGRAPHY Left 09/05/2020   Procedure: LOWER EXTREMITY ANGIOGRAPHY;  Surgeon: Jama Cordella MATSU, MD;  Location: [facility];  Service: Cardiovascular;  Laterality: Left;   LOWER EXTREMITY ANGIOGRAPHY Left 04/10/2021   Procedure: LOWER EXTREMITY ANGIOGRAPHY;  Surgeon: Jama Cordella MATSU, MD;  Location: [facility];  Service: Cardiovascular;  Laterality: Left;   LOWER EXTREMITY ANGIOGRAPHY Left 11/20/2021   Procedure: Lower Extremity Angiography;  Surgeon: Jama Cordella MATSU, MD;  Location: [facility];  Service: Cardiovascular;  Laterality: Left;   LOWER EXTREMITY ANGIOGRAPHY Left 06/05/2022   Procedure: Lower Extremity Angiography;  Surgeon: Jama Cordella MATSU, MD;  Location: [facility];  Service: Cardiovascular;  Laterality: Left;   LOWER EXTREMITY ANGIOGRAPHY Left 07/23/2022   Procedure: Lower Extremity Angiography;  Surgeon: Jama Cordella MATSU, MD;  Location: [facility];  Service: Cardiovascular;  Laterality: Left;   LOWER EXTREMITY ANGIOGRAPHY Left 12/27/2022   Procedure: Lower Extremity Angiography;  Surgeon: Jama Cordella MATSU, MD;  Location: [facility];  Service: Cardiovascular;  Laterality: Left;   LOWER EXTREMITY ANGIOGRAPHY Left 06/17/2023   Procedure: Lower Extremity Angiography;  Surgeon: Jama Cordella MATSU, MD;  Location: [facility];  Service: Cardiovascular;  Laterality: Left;   LOWER EXTREMITY ANGIOGRAPHY Left 01/20/2024   Procedure: Lower Extremity Angiography;  Surgeon: Jama Cordella MATSU, MD;  Location: [facility];  Service: Cardiovascular;  Laterality: Left;   LOWER EXTREMITY ANGIOGRAPHY Left 01/21/2024   Procedure: Lower Extremity Angiography;  Surgeon: Jama Cordella MATSU, MD;  Location: [facility];  Service: Cardiovascular;  Laterality: Left;   LOWER EXTREMITY INTERVENTION Left 01/20/2024   Procedure: LOWER EXTREMITY INTERVENTION;  Surgeon: Jama Cordella MATSU, MD;  Location: [facility];  Service: Cardiovascular;  Laterality: Left;   TOTAL ABDOMINAL HYSTERECTOMY     Past Gynecologic History:  Menarche: 14 Menstrual details: Lasts 3 days Menses regular: yes Last Menstrual Period: Unknown History of OCP/HRT use: No History of Abnormal pap: yes, as per HPI Last pap: 08/2019 History of STDs: The patient reports a past history of: herpes. Sexually active: not asked  OB History:  OB History  No obstetric history on file.   Family History: Family History  Problem Relation Age of Onset   Cancer Mother    Pancreatic cancer Mother    Heart failure Mother    Heart attack Father 58   Heart disease Father    Social History: Social History    Socioeconomic History   Marital status: Married    Spouse name: Norm    Number of children: 1   Years of education: Not on file   Highest education level: Not on file  Occupational History   Occupation: retired   Tobacco Use   Smoking status: Never   Smokeless tobacco: Never  Vaping Use   Vaping status: Never Used  Substance and Sexual Activity   Alcohol  use: Yes    Alcohol /week: 1.0 standard drink of alcohol     Types: 1 Glasses of wine per week    Comment: 5   Drug use: Never   Sexual activity: Yes  Other Topics Concern   Not on file  Social History Narrative   Lives with spouse at [facility]    Social Drivers of Health   Financial Resource Strain: Low Risk  (03/24/2024)   [facility]   Overall Financial Resource Strain (CARDIA)    Difficulty of Paying Living Expenses: Not hard at all  Food Insecurity: No Food Insecurity (03/24/2024)   [facility]   Hunger Vital Sign    Within the past 12 months, you worried that your food would run out before you got the money to buy more.: Never true    Within the past 12 months, the food you bought just didn't last and you didn't have money to get more.: Never true  Transportation Needs: No Transportation Needs (03/24/2024)   Received from [facility] - Transportation    In the past 12 months, has lack of transportation kept you from medical appointments or from getting medications?: No    Lack of Transportation (Non-Medical): No  Physical Activity: Not on file  Stress: Not on file  Social Connections: Socially Integrated (01/21/2024)   Social Connection and Isolation Panel    Frequency of Communication with Friends and Family: Twice a week    Frequency of Social Gatherings with Friends and Family: Twice a week    Attends Religious Services: 1 to 4 times per year    Active Member of [employer] or Organizations: No    Attends Banker  Meetings: 1 to 4 times per year    Marital Status: Married  Catering manager Violence: Not At Risk (01/23/2024)   Humiliation, Afraid, Rape, and Kick questionnaire    Fear of Current or Ex-Partner: No    Emotionally Abused: No    Physically Abused: No    Sexually Abused: No    Allergies: Allergies  Allergen Reactions   Lactose Other (See Comments) and Nausea And Vomiting    Gi upset and diarrhea  Other reaction(s): Other (See Comments)  Gi upset and diarrhea  Other reaction(s): Other (See Comments) Gi upset and diarrhea Gi upset and diarrhea Gi upset and diarrhea    Gi upset and diarrhea    Gi upset and diarrhea Gi upset and diarrhea Gi upset and diarrhea  Gi upset and diarrhea    Gi upset and diarrhea  Other reaction(s): Other (See Comments) Gi upset and diarrhea Gi upset and diarrhea Gi upset and diarrhea  Gi upset and diarrhea  Gi upset and diarrhea Gi upset and diarrhea Gi upset and diarrhea    Current Medications: Current Outpatient Medications  Medication Sig Dispense Refill   amiodarone  (PACERONE ) 200 MG tablet Take 1 tablet (200 mg total) by mouth daily. 90 tablet 2   apixaban  (ELIQUIS ) 5 MG TABS tablet Take 1 tablet (5 mg total) by mouth 2 (two) times daily. 60 tablet 3   B Complex Vitamins (VITAMIN B-COMPLEX) TABS Take 1 tablet by mouth daily.     budesonide  (ENTOCORT EC ) 3 MG 24 hr capsule Take 3 capsules (9 mg total) by mouth every morning. 270 capsule 2   diphenoxylate -atropine  (LOMOTIL ) 2.5-0.025 MG tablet TAKE 1 TABLET BY MOUTH 3 TIMES DAILY AS NEEDED FOR DIARRHEA. 90 tablet 0   estradiol  (ESTRACE ) 0.1 MG/GM vaginal cream Insert 1/4 applicator full into vagina at night twice a week 42.5 g 2   levothyroxine  (SYNTHROID ) 75 MCG tablet Take 75 mcg by mouth daily before breakfast.      losartan  (COZAAR ) 25 MG tablet Take 25 mg by mouth daily.     losartan  (COZAAR ) 25 MG tablet Take 1 tablet by mouth daily.     rosuvastatin  (CRESTOR ) 5 MG tablet Take 1 tablet (5 mg  total) by mouth daily. 90 tablet 1   ticagrelor  (BRILINTA ) 90 MG TABS tablet TAKE 1 TABLET BY MOUTH TWICE DAILY 180 tablet 3   No current facility-administered medications for this visit.    Review of Systems General: no complaints  HEENT: no complaints  Lungs: no complaints  Cardiac: no complaints  GI: no complaints  GU: no complaints  Musculoskeletal: no complaints  Extremities: no complaints  Skin: no complaints  Neuro: no complaints  Endocrine: no complaints  Psych: no complaints        Objective:  Physical Examination:  BP (!) 156/60   Pulse 62   Temp 98.6 F (37 C)   Resp 20   Wt 132 lb 12.8 oz (60.2 kg)   SpO2 100%   BMI 20.19 kg/m    ECOG Performance Status: 0 - Asymptomatic  GENERAL: Patient is a well appearing female in no acute distress HEENT: Atraumatic normocephalic NODES:  No inguinal lymphadenopathy palpated.  ABDOMEN:  Soft, nontender, nondistended.   EXTREMITIES:  No peripheral edema.   NEURO:  Nonfocal. Well oriented.  Appropriate affect.  Pelvic: Noted by NP EGBUS: no lesions Cervix: Surgically absent  Vagina: Atrophic and shortened with prominent dimples in fornices.  Pap smear was obtained. No lesions noted.  There was no discharge or active bleeding. Uterus: Surgically absent BME: no palpable masses  Assessment:  Melanie Browning is a 83 y.o. female s/p TAH-BSO 09/05/2015 for dysplasia but no dysplasia in specimen.  Low grade PAP abnormalities since then with colposcopy negative.  PAP 12/20 VAIN1 HPV+.  Moved to this area and first seen 7/21 with normal colposcopy.  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  Colposcopy of vagina 2/22 with no lesions seen.   No symptoms. We discussed options for management including surveillance versus Efudex  treatment.  She is anxious.and preferred treatment.  She used  Efudex  once a week for 10 weeks with some interruption for vulvar irritation. PAP 02/28/21 HSIL, HR HPV-. Confirmed on [facility] review.  04/18/21- ASC-H; cannot exclude  high grade squamous intraepithelial. On colposcopy 06/06/2021 with Lugol's small area of non staining biopsied, VAIN1. PAP 3/23 showed HSIL/HPV+.  On colposcopy an area with punctation seen in the left fornix.  Biopsy left fornix 12/26/21 atypia only.  05/08/2022 HGSIL vaginal pap; HRHPV positive.  Colposcopy showed VAIN1.  PAP 6/24 NILM, HPV negative.  No new symptoms.  Using vaginal estrogen twice weekly.  No complaints today.  1/25 PAP LSIL and HPV positive. Exam today unchanged.    Vaginal atrophy.  Peripheral vascular disease in left leg, underwent recent stent placement after failure of bypass.  Medical co-morbidities complicating care: PAD, Dyslipidemia  Plan:   Problem List Items Addressed This Visit       Genitourinary   Vaginal dysplasia - Primary   PAP/HPV done.  Continue vaginal estrogen in view of atrophy and abnormal PAPs, which did aid visualization.  If surgery for vaginectomy recommended in the future, may ask Dr Dorice to do this, as UroGyn much more comfortable with this dissection and avoidance of the bladder.  Plan for return in 6 months.  The patient's diagnosis, an outline of the further diagnostic and laboratory studies which will be required, the recommendation, and alternatives were discussed.  All questions were answered to the patient's satisfaction.   Prentice Agent, MD

## 2024-04-26 LAB — IGP, APTIMA HPV: HPV Aptima: NEGATIVE

## 2024-04-29 ENCOUNTER — Encounter (INDEPENDENT_AMBULATORY_CARE_PROVIDER_SITE_OTHER): Payer: Self-pay | Admitting: Vascular Surgery

## 2024-04-30 ENCOUNTER — Encounter (INDEPENDENT_AMBULATORY_CARE_PROVIDER_SITE_OTHER): Payer: Self-pay

## 2024-05-03 ENCOUNTER — Other Ambulatory Visit (INDEPENDENT_AMBULATORY_CARE_PROVIDER_SITE_OTHER): Payer: Self-pay | Admitting: Vascular Surgery

## 2024-05-03 DIAGNOSIS — I70212 Atherosclerosis of native arteries of extremities with intermittent claudication, left leg: Secondary | ICD-10-CM

## 2024-05-03 DIAGNOSIS — I2510 Atherosclerotic heart disease of native coronary artery without angina pectoris: Secondary | ICD-10-CM

## 2024-05-03 DIAGNOSIS — I25118 Atherosclerotic heart disease of native coronary artery with other forms of angina pectoris: Secondary | ICD-10-CM

## 2024-05-04 ENCOUNTER — Other Ambulatory Visit
Admission: RE | Admit: 2024-05-04 | Discharge: 2024-05-04 | Disposition: A | Discharge status: Home / Self Care | Attending: Vascular Surgery | Admitting: Vascular Surgery

## 2024-05-04 DIAGNOSIS — I2510 Atherosclerotic heart disease of native coronary artery without angina pectoris: Secondary | ICD-10-CM | POA: Insufficient documentation

## 2024-05-04 DIAGNOSIS — I70212 Atherosclerosis of native arteries of extremities with intermittent claudication, left leg: Secondary | ICD-10-CM | POA: Insufficient documentation

## 2024-05-05 LAB — HIGH SENSITIVITY CRP: CRP, High Sensitivity: 1.47 mg/L (ref 0.00–3.00)

## 2024-05-07 ENCOUNTER — Encounter: Payer: Self-pay | Admitting: Cardiovascular Disease

## 2024-05-07 ENCOUNTER — Ambulatory Visit: Attending: Cardiovascular Disease | Admitting: Cardiovascular Disease

## 2024-05-07 VITALS — BP 130/78 | HR 62 | Ht 68.0 in | Wt 129.4 lb

## 2024-05-07 DIAGNOSIS — I2510 Atherosclerotic heart disease of native coronary artery without angina pectoris: Secondary | ICD-10-CM | POA: Diagnosis present

## 2024-05-07 DIAGNOSIS — I480 Paroxysmal atrial fibrillation: Secondary | ICD-10-CM | POA: Insufficient documentation

## 2024-05-07 DIAGNOSIS — E785 Hyperlipidemia, unspecified: Secondary | ICD-10-CM | POA: Diagnosis present

## 2024-05-07 DIAGNOSIS — I739 Peripheral vascular disease, unspecified: Secondary | ICD-10-CM | POA: Insufficient documentation

## 2024-05-07 NOTE — Patient Instructions (Signed)
 Medication Instructions:  No changes *If you need a refill on your cardiac medications before your next appointment, please call your pharmacy*  Lab Work: None ordered If you have labs (blood work) drawn today and your tests are completely normal, you will receive your results only by: MyChart Message (if you have MyChart) OR A paper copy in the mail If you have any lab test that is abnormal or we need to change your treatment, we will call you to review the results.  Testing/Procedures: None ordered  Follow-Up: At [facility], you and your health needs are our priority.  As part of our continuing mission to provide you with exceptional heart care, our providers are all part of one team.  This team includes your primary Cardiologist (physician) and Advanced Practice Providers or APPs (Physician Assistants and Nurse Practitioners) who all work together to provide you with the care you need, when you need it.  Your next appointment:   6 month(s)  Provider:   You may see Dr. Alvenia Aus or one of the following Advanced Practice Providers on your designated Care Team:   Laneta Pintos, NP Gildardo Labrador, PA-C Varney Gentleman, PA-C Cadence [place], PA-C Ronald Cockayne, NP Morey Ar, NP    We recommend signing up for the patient portal called MyChart.  Sign up information is provided on this After Visit Summary.  MyChart is used to connect with patients for Virtual Visits (Telemedicine).  Patients are able to view lab/test results, encounter notes, upcoming appointments, etc.  Non-urgent messages can be sent to your provider as well.   To learn more about what you can do with MyChart, go to [email or web address].

## 2024-05-07 NOTE — Progress Notes (Signed)
 Cardiology Office Note   Date:  05/07/2024   ID:  Melanie Browning, DOB 1941/05/10, MRN [id number]  PCP:  Epifanio Alm SQUIBB, MD  Cardiologist:   Deatrice Cage, MD   Chief Complaint  Patient presents with   Follow-up    Pt recently saw [facility]. Surgeon c/o left leg/feet edema. Meds reviewed verbally with pt.      History of Present Illness: Melanie Browning is a 83 y.o. female who is here today for a follow-up visit regarding coronary artery disease and paroxysmal atrial fibrillation.   She has known history of coronary artery disease status post PCI and stent placement to the LAD in 2022, extensive peripheral arterial disease with critical limb ischemia and multiple revascularization of the lower extremity followed by Dr. Jama, essential hypertension and hyperlipidemia. She was hospitalized in April of this year for critical limb ischemia and underwent endovascular intervention on the left lower extremity.  She developed A-fib with RVR in the setting of severe leg pain.  She was started on amiodarone  drip and was anticoagulated.  She was in the hospital for 2 weeks.  She had multiple complications including colitis, anemia and A-fib.  She has been doing well with no chest pain, shortness of breath or palpitations.  She has stable left leg pain.  She was seen at the [facility] clinic functional medicine department with recommendations regarding diet and lifestyle changes. In addition, there was recommendation to consider low-dose rivaroxaban and switching to low-dose aspirin .  However, I do not think they were aware of her history of atrial fibrillation. She does complain of easy bleeding with any cuts.   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)     a.) multiple vascular interventions (PTA, stenting, arthrectomy, and thrombectomy) of LEFT SFA, LEFT popliteal, LEFT posterior tibial, and LEFT anterior tibial arteries due to critical limb ischemia   CAD (coronary artery disease) 11/09/2020   a.) cCTA 11/09/2020: Ca2+ 539 (83rd %'ile for age/sex/race match control); <25% dRCA & pLCx, >70% pLAD; b.) LHC/PCI 11/15/2020: 90% pLAD (2.75 x 18 mm Resolute Onyx DES); c.) MV 06/26/2023: no sig ischemia   Cervical spondylosis    Colitis    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)    a.) s/p multiple vascular interventions   Diastolic dysfunction 06/26/2023   a.) TTE 06/26/2023: EF 60-65%, no RWMAs, G2DD, sev AoV thickening with mild AR, mild-mod AS (MPG 18.2 mmHg; AVA 0.73 cm2)   [place]   Dyslipidemia    History of cervical cancer    a.) s/p TAH   HPV (human papilloma virus) infection    Hyperlipidemia    Hypothyroidism    IGT (impaired glucose tolerance)    Left upper lobe pulmonary nodule    Long term current use of aspirin     Long term current use of ticagrelor  therapy    Macrocytic anemia    Osteoporosis    a.) Tx'd with zolodronic acid (Reclast) infusions   PAD (peripheral artery disease) (HCC)    Polymyalgia rheumatica (HCC)    Skin cancer of nose    Transaminitis    Vaginal dysplasia     Past Surgical History:  Procedure Laterality Date  COLONOSCOPY WITH PROPOFOL  N/A 10/04/2020   Procedure: COLONOSCOPY WITH PROPOFOL ;  Surgeon: Toledo, Ladell POUR, MD;  Location: [facility] ENDOSCOPY;  Service: Gastroenterology;  Laterality: N/A;   COLPOSCOPY     CORONARY STENT INTERVENTION N/A 11/15/2020   Procedure: CORONARY STENT INTERVENTION;  Surgeon: Florencio Cara BIRCH, MD;  Location: [facility] INV[facility]IVE CV [facility];  Service: Cardiovascular;  Laterality: N/A;   FEMORAL-POPLITEAL BYPASS GRAFT Left 07/16/2023   Procedure: BYPASS GRAFT FEMORAL-POPLITEAL ARTERY (FEMORAL-POSTERIOR TIBIAL BYPASS W/ SAPHENOUS  VEIN- [id number]);  Surgeon: Jama Cordella MATSU, MD;  Location: [facility] ORS;  Service: Vascular;  Laterality: Left;   KNEE ARTHROSCOPY Left    LAPAROSCOPIC HYSTERECTOMY     LEFT HEART CATH AND CORONARY ANGIOGRAPHY N/A 11/15/2020   Procedure: LEFT HEART CATH AND CORONARY ANGIOGRAPHY;  Surgeon: Florencio Cara BIRCH, MD;  Location: [facility] IN[facility] CV [facility];  Service: Cardiovascular;  Laterality: N/A;   LOWER EXTREMITY ANGIOGRAPHY Left 09/05/2020   Procedure: LOWER EXTREMITY ANGIOGRAPHY;  Surgeon: Jama Cordella MATSU, MD;  Location: [facility];  Service: Cardiovascular;  Laterality: Left;   LOWER EXTREMITY ANGIOGRAPHY Left 04/10/2021   Procedure: LOWER EXTREMITY ANGIOGRAPHY;  Surgeon: Jama Cordella MATSU, MD;  Location: [facility];  Service: Cardiovascular;  Laterality: Left;   LOWER EXTREMITY ANGIOGRAPHY Left 11/20/2021   Procedure: Lower Extremity Angiography;  Surgeon: Jama Cordella MATSU, MD;  Location: [facility];  Service: Cardiovascular;  Laterality: Left;   LOWER EXTREMITY ANGIOGRAPHY Left 06/05/2022   Procedure: Lower Extremity Angiography;  Surgeon: Jama Cordella MATSU, MD;  Location: [facility];  Service: Cardiovascular;  Laterality: Left;   LOWER EXTREMITY ANGIOGRAPHY Left 07/23/2022   Procedure: Lower Extremity Angiography;  Surgeon: Jama Cordella MATSU, MD;  Location: [facility];  Service: Cardiovascular;  Laterality: Left;   LOWER EXTREMITY ANGIOGRAPHY Left 12/27/2022   Procedure: Lower Extremity Angiography;  Surgeon: Jama Cordella MATSU, MD;  Location: [facility];  Service: Cardiovascular;  Laterality: Left;   LOWER EXTREMITY ANGIOGRAPHY Left 06/17/2023   Procedure: Lower Extremity Angiography;  Surgeon: Jama Cordella MATSU, MD;  Location: [facility];  Service: Cardiovascular;  Laterality: Left;   LOWER EXTREMITY ANGIOGRAPHY Left 01/20/2024   Procedure: Lower Extremity Angiography;  Surgeon: Jama Cordella MATSU, MD;  Location: [facility]  INVASIVE CV LAB;  Service: Cardiovascular;  Laterality: Left;   LOWER EXTREMITY ANGIOGRAPHY Left 01/21/2024   Procedure: Lower Extremity Angiography;  Surgeon: Jama Cordella MATSU, MD;  Location: [facility];  Service: Cardiovascular;  Laterality: Left;   LOWER EXTREMITY INTERVENTION Left 01/20/2024   Procedure: LOWER EXTREMITY INTERVENTION;  Surgeon: Jama Cordella MATSU, MD;  Location: [facility];  Service: Cardiovascular;  Laterality: Left;   TOTAL ABDOMINAL HYSTERECTOMY       Current Outpatient Medications  Medication Sig Dispense Refill   amiodarone  (PACERONE ) 200 MG tablet Take 1 tablet (200 mg total) by mouth daily. 90 tablet 2   apixaban  (ELIQUIS ) 5 MG TABS tablet Take 1 tablet (5 mg total) by mouth 2 (two) times daily. 60 tablet 3   ascorbic acid  (VITAMIN C) 1000 MG tablet Take 2,000-3,000 mg by mouth as needed.     B Complex Vitamins (VITAMIN B-COMPLEX) TABS Take 1 tablet by mouth daily.     budesonide  (ENTOCORT EC ) 3 MG 24 hr capsule Take 3 capsules (9 mg total) by mouth every morning. 270 capsule 2   Cholecalciferol  125 MCG (5000 UT) capsule Take 5,000 Units by mouth.     diphenoxylate -atropine  (LOMOTIL ) 2.5-0.025 MG  tablet TAKE 1 TABLET BY MOUTH 3 TIMES DAILY AS NEEDED FOR DIARRHEA. 90 tablet 0   estradiol  (ESTRACE ) 0.1 MG/GM vaginal cream Insert 1/4 applicator full into vagina at night twice a week 42.5 g 2   HYDROcodone -acetaminophen  (NORCO) 10-325 MG tablet Take 1 tablet by mouth as needed.     levothyroxine  (SYNTHROID ) 75 MCG tablet Take 75 mcg by mouth daily before breakfast.      losartan  (COZAAR ) 25 MG tablet Take 25 mg by mouth daily.     rosuvastatin  (CRESTOR ) 5 MG tablet Take 1 tablet (5 mg total) by mouth daily. 90 tablet 1   ticagrelor  (BRILINTA ) 90 MG TABS tablet TAKE 1 TABLET BY MOUTH TWICE DAILY 180 tablet 3   losartan  (COZAAR ) 25 MG tablet Take 1 tablet by mouth daily. (Patient not taking: Reported on 05/07/2024)     No current facility-administered  medications for this visit.    Allergies:   Lactose    Social History:  The patient  reports that she has never smoked. She has never used smokeless tobacco. She reports current alcohol  use of about 1.0 standard drink of alcohol  per week. She reports that she does not use drugs.   Family History:  The patient's family history includes Cancer in her mother; Heart attack (age of onset: 30) in her father; Heart disease in her father; Heart failure in her mother; Pancreatic cancer in her mother.    ROS:  Please see the history of present illness.   Otherwise, review of systems are positive for none.   All other systems are reviewed and negative.    PHYSICAL EXAM: VS:  BP 130/78 (BP Location: Left Arm, Patient Position: Sitting)   Pulse 62   Ht 5' 8 (1.727 m)   Wt 129 lb 6.4 oz (58.7 kg)   SpO2 98%   BMI 19.68 kg/m  , BMI Body mass index is 19.68 kg/m. GEN: Well nourished, well developed, in no acute distress  HEENT: normal  Neck: no JVD, carotid bruits, or masses Cardiac: RRR; no  rubs, or gallops,no edema .  2/ 6 systolic murmur in the aortic area which is early peaking.  The murmur radiates to the carotid arteries. Respiratory:  clear to auscultation bilaterally, normal work of breathing GI: soft, nontender, nondistended, + BS MS: no deformity or atrophy  Skin: warm and dry, no rash Neuro:  Strength and sensation are intact Psych: euthymic mood, full affect   EKG:  EKG is ordered today. The ekg ordered today demonstrates : Normal sinus rhythm with sinus arrhythmia Normal ECG When compared with ECG of 24-Feb-2024 14:17, No significant change was found     Recent Labs: 01/26/2024: TSH 8.093 01/27/2024: ALT 14 02/02/2024: BUN 15; Creatinine, Ser 0.65; Hemoglobin 9.1; Magnesium  1.7; Platelets 370; Potassium 3.9; Sodium 134    Lipid Panel    Component Value Date/Time   CHOL 323 (H) 06/17/2023 0931   TRIG 78 06/17/2023 0931   HDL 149 06/17/2023 0931   CHOLHDL 2.2  06/17/2023 0931   LDLCALC 163 (H) 06/17/2023 0931      Wt Readings from Last 3 Encounters:  05/07/24 129 lb 6.4 oz (58.7 kg)  04/21/24 132 lb 12.8 oz (60.2 kg)  04/12/24 132 lb 9.6 oz (60.1 kg)         10/01/2022    9:25 AM  PAD Screen  Previous PAD dx? Yes  Previous surgical procedure? Yes  Dates of procedures left popliteal artery  Pain with walking? No  Feet/toe  relief with dangling? No  Painful, non-healing ulcers? No  Extremities discolored? No      ASSESSMENT AND PLAN:  1.  Coronary artery disease involving native coronary arteries without angina: Previous PCI.  Currently with no anginal symptoms.  2.  Peripheral arterial disease: Extensive revascularization most recently endovascular intervention on the left lower extremity.  She is on ticagrelor  monotherapy given the need for anticoagulation for A-fib.  Will consider decreasing the dose of Brilinta  to 60 mg twice daily in 6 months from now if she remains stable.  3.  Hyperlipidemia: She is very hesitant about statins but has been taking rosuvastatin  5 mg once daily given her significantly elevated LDL.  4.  Paroxysmal atrial fibrillation: She is maintaining sinus rhythm with amiodarone  200 mg once daily.  Continue anticoagulation with Eliquis .  I will consider decreasing the dose of amiodarone  to 100 mg once daily upon follow-up.  5.  Aortic stenosis: This was mild on most recent echocardiogram in April.  Consider repeat echocardiogram in 2 years.    Disposition:   FU with me in 6 months  Signed,  Deatrice Cage, MD  05/07/2024 8:49 AM    [facility]

## 2024-05-11 ENCOUNTER — Other Ambulatory Visit (INDEPENDENT_AMBULATORY_CARE_PROVIDER_SITE_OTHER): Payer: Self-pay | Admitting: Vascular Surgery

## 2024-05-11 ENCOUNTER — Encounter (INDEPENDENT_AMBULATORY_CARE_PROVIDER_SITE_OTHER): Payer: Self-pay | Admitting: Vascular Surgery

## 2024-05-11 LAB — MISC LABCORP TEST (SEND OUT): Labcorp test code: 94218

## 2024-05-13 ENCOUNTER — Other Ambulatory Visit (INDEPENDENT_AMBULATORY_CARE_PROVIDER_SITE_OTHER): Payer: Self-pay | Admitting: Vascular Surgery

## 2024-05-13 DIAGNOSIS — I739 Peripheral vascular disease, unspecified: Secondary | ICD-10-CM

## 2024-05-13 NOTE — Progress Notes (Signed)
 Lab value requested per Dr. Franchot at the [facility]

## 2024-05-15 ENCOUNTER — Other Ambulatory Visit (INDEPENDENT_AMBULATORY_CARE_PROVIDER_SITE_OTHER): Payer: Self-pay | Admitting: Vascular Surgery

## 2024-05-19 ENCOUNTER — Encounter (INDEPENDENT_AMBULATORY_CARE_PROVIDER_SITE_OTHER): Payer: Self-pay | Admitting: Vascular Surgery

## 2024-05-20 LAB — MISC LABCORP TEST (SEND OUT): Labcorp test code: 37847

## 2024-05-24 ENCOUNTER — Encounter (INDEPENDENT_AMBULATORY_CARE_PROVIDER_SITE_OTHER): Payer: Self-pay | Admitting: Vascular Surgery

## 2024-05-27 ENCOUNTER — Ambulatory Visit (INDEPENDENT_AMBULATORY_CARE_PROVIDER_SITE_OTHER)

## 2024-05-27 ENCOUNTER — Encounter (INDEPENDENT_AMBULATORY_CARE_PROVIDER_SITE_OTHER): Payer: Self-pay | Admitting: Vascular Surgery

## 2024-05-27 ENCOUNTER — Ambulatory Visit (INDEPENDENT_AMBULATORY_CARE_PROVIDER_SITE_OTHER): Admitting: Vascular Surgery

## 2024-05-27 VITALS — BP 154/73 | Ht 68.0 in | Wt 127.0 lb

## 2024-05-27 DIAGNOSIS — E782 Mixed hyperlipidemia: Secondary | ICD-10-CM | POA: Diagnosis not present

## 2024-05-27 DIAGNOSIS — I70212 Atherosclerosis of native arteries of extremities with intermittent claudication, left leg: Secondary | ICD-10-CM | POA: Diagnosis not present

## 2024-05-27 DIAGNOSIS — S85002S Unspecified injury of popliteal artery, left leg, sequela: Secondary | ICD-10-CM

## 2024-05-27 DIAGNOSIS — I480 Paroxysmal atrial fibrillation: Secondary | ICD-10-CM

## 2024-05-27 DIAGNOSIS — I25118 Atherosclerotic heart disease of native coronary artery with other forms of angina pectoris: Secondary | ICD-10-CM

## 2024-05-27 DIAGNOSIS — I6523 Occlusion and stenosis of bilateral carotid arteries: Secondary | ICD-10-CM | POA: Diagnosis not present

## 2024-05-27 DIAGNOSIS — C44319 Basal cell carcinoma of skin of other parts of face: Secondary | ICD-10-CM

## 2024-05-27 LAB — MISC LABCORP TEST (SEND OUT): Labcorp test code: 92701

## 2024-05-31 ENCOUNTER — Encounter (INDEPENDENT_AMBULATORY_CARE_PROVIDER_SITE_OTHER): Payer: Self-pay | Admitting: Vascular Surgery

## 2024-05-31 DIAGNOSIS — I6529 Occlusion and stenosis of unspecified carotid artery: Secondary | ICD-10-CM | POA: Insufficient documentation

## 2024-05-31 DIAGNOSIS — C4491 Basal cell carcinoma of skin, unspecified: Secondary | ICD-10-CM | POA: Insufficient documentation

## 2024-05-31 NOTE — Progress Notes (Signed)
 MRN : [id number]  Melanie Browning is a 83 y.o. (22-Sep-1941) female who presents with chief complaint of check circulation.  History of Present Illness:  The patient returns to the office for followup and review status post angiogram with intervention on 01/21/2024.    Procedure:  Percutaneous transluminal angioplasty and stent placement left SFA to 6 mm 2.    Percutaneous transluminal angioplasty and stent placement left tibioperoneal trunk and proximal posterior tibial artery with Esprit stents dilated to 3.5 mm distally and 4 mm proximally 3.     Introduction catheter into the venous branch of the failed saphenous vein bypass 4.    Coil embolization of the venous branch of the failed saphenous vein bypass using Ruby coils   The patient notes stability in the lower extremity symptoms. No interval shortening of the patient's claudication distance and her rest pain symptoms.  She notes that she is walking her dog without difficulty and without symptoms.  No new ulcers or wounds have occurred since the last visit.   There have been no significant changes to the patient's overall health care.  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   No documented history of amaurosis fugax or recent TIA symptoms. There are no recent neurological changes noted. No documented history of DVT, PE or superficial thrombophlebitis. The patient denies recent episodes of angina or shortness of breath.    Previous ABI's Rt=1.10 and Lt=1.02 (triphasic signal) (previous ABI's Rt=1.07 and Lt=1.14)   Duplex ultrasound dated 05/27/2024 demonstrates the current arterial reconstruction is patent  Current Meds  Medication Sig   amiodarone  (PACERONE ) 200 MG tablet Take 1 tablet (200 mg total) by mouth daily.   apixaban  (ELIQUIS ) 5 MG TABS tablet TAKE ONE TABLET BY MOUTH TWICE  DAILY   ascorbic acid  (VITAMIN C) 1000 MG tablet Take 2,000-3,000 mg by mouth as needed.   B Complex Vitamins (VITAMIN B-COMPLEX) TABS Take 1 tablet by mouth daily.   budesonide  (ENTOCORT EC ) 3 MG 24 hr capsule Take 3 capsules (9 mg total) by mouth every morning.   Cholecalciferol  125 MCG (5000 UT) capsule Take 5,000 Units by mouth.   diphenoxylate -atropine  (LOMOTIL ) 2.5-0.025 MG tablet TAKE 1 TABLET BY MOUTH 3 TIMES DAILY AS NEEDED FOR DIARRHEA.   estradiol  (ESTRACE ) 0.1 MG/GM vaginal cream Insert 1/4 applicator full into vagina at night twice a week   HYDROcodone -acetaminophen  (NORCO) 10-325 MG tablet Take 1 tablet by mouth as needed.   levothyroxine  (SYNTHROID ) 75 MCG tablet Take 75 mcg by mouth daily before breakfast.    losartan  (COZAAR ) 25 MG tablet Take 25 mg by mouth daily.   rosuvastatin  (CRESTOR ) 5 MG tablet Take 1 tablet (5 mg total) by mouth daily.   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)    a.) multiple vascular interventions (PTA, stenting, arthrectomy, and thrombectomy) of LEFT SFA, LEFT popliteal, LEFT posterior tibial, and LEFT anterior tibial arteries due to critical limb ischemia   CAD (coronary artery disease) 11/09/2020   a.) cCTA 11/09/2020: Ca2+ 539 (83rd %'ile for age/sex/race match control); <25% dRCA & pLCx, >70% pLAD; b.) LHC/PCI 11/15/2020: 90% pLAD (2.75 x 18 mm Resolute Onyx DES); c.) MV 06/26/2023: no sig ischemia   Cervical spondylosis    Colitis    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)    a.) s/p multiple vascular interventions   Diastolic dysfunction 06/26/2023   a.) TTE 06/26/2023: EF 60-65%, no RWMAs, G2DD, sev AoV thickening with  mild AR, mild-mod AS (MPG 18.2 mmHg; AVA 0.73 cm2)   [place]   Dyslipidemia    History of cervical cancer    a.) s/p TAH   HPV (human papilloma virus) infection    Hyperlipidemia    Hypothyroidism    IGT (impaired glucose tolerance)    Left upper lobe pulmonary nodule    Long term current use of aspirin     Long term current use of ticagrelor  therapy    Macrocytic anemia    Osteoporosis    a.) Tx'd with zolodronic acid (Reclast) infusions   PAD (peripheral artery disease) (HCC)    Polymyalgia rheumatica (HCC)    Skin cancer of nose    Transaminitis    Vaginal dysplasia     Past Surgical History:  Procedure Laterality Date   COLONOSCOPY WITH PROPOFOL  N/A 10/04/2020   Procedure: COLONOSCOPY WITH PROPOFOL ;  Surgeon: Toledo, Ladell POUR, MD;  Location: [facility];  Service: Gastroenterology;  Laterality: N/A;   COLPOSCOPY     CORONARY STENT INTERVENTION N/A 11/15/2020   Procedure: CORONARY STENT INTERVENTION;  Surgeon: Florencio Cara BIRCH, MD;  Location: [facility];  Service: Cardiovascular;  Laterality: N/A;   FEMORAL-POPLITEAL BYPASS GRAFT Left 07/16/2023   Procedure: BYPASS GRAFT FEMORAL-POPLITEAL ARTERY (FEMORAL-POSTERIOR TIBIAL BYPASS W/ SAPHENOUS VEIN- [id number]);  Surgeon: Jama Cordella MATSU, MD;  Location: [facility];  Service: Vascular;  Laterality: Left;   KNEE ARTHROSCOPY Left    LAPAROSCOPIC HYSTERECTOMY     LEFT HEART CATH AND CORONARY ANGIOGRAPHY N/A 11/15/2020   Procedure: LEFT HEART CATH AND CORONARY ANGIOGRAPHY;  Surgeon: Florencio Cara BIRCH, MD;  Location: [facility];  Service: Cardiovascular;  Laterality: N/A;   LOWER EXTREMITY ANGIOGRAPHY Left 09/05/2020   Procedure: LOWER EXTREMITY ANGIOGRAPHY;  Surgeon: Jama Cordella MATSU, MD;  Location: [facility];  Service: Cardiovascular;  Laterality: Left;   LOWER EXTREMITY ANGIOGRAPHY Left 04/10/2021   Procedure: LOWER EXTREMITY ANGIOGRAPHY;  Surgeon: Jama Cordella MATSU, MD;  Location: [facility];  Service: Cardiovascular;  Laterality: Left;   LOWER EXTREMITY ANGIOGRAPHY Left 11/20/2021   Procedure: Lower Extremity Angiography;  Surgeon: Jama Cordella MATSU, MD;  Location: [facility];  Service: Cardiovascular;  Laterality: Left;   LOWER EXTREMITY ANGIOGRAPHY Left 06/05/2022   Procedure: Lower Extremity Angiography;  Surgeon: Jama Cordella MATSU, MD;  Location: [facility];  Service: Cardiovascular;  Laterality: Left;   LOWER EXTREMITY ANGIOGRAPHY Left 07/23/2022   Procedure: Lower Extremity Angiography;  Surgeon: Jama Cordella MATSU, MD;  Location: [facility];  Service: Cardiovascular;  Laterality: Left;   LOWER EXTREMITY ANGIOGRAPHY Left 12/27/2022   Procedure: Lower Extremity Angiography;  Surgeon: Jama Cordella  G, MD;  Location: [facility];  Service: Cardiovascular;  Laterality: Left;   LOWER EXTREMITY ANGIOGRAPHY Left 06/17/2023   Procedure: Lower Extremity Angiography;  Surgeon: Jama Cordella MATSU, MD;  Location: [facility];  Service: Cardiovascular;  Laterality: Left;   LOWER EXTREMITY ANGIOGRAPHY Left 01/20/2024   Procedure: Lower Extremity Angiography;  Surgeon: Jama Cordella MATSU, MD;  Location: [facility];  Service: Cardiovascular;  Laterality: Left;   LOWER EXTREMITY ANGIOGRAPHY Left 01/21/2024   Procedure: Lower Extremity Angiography;  Surgeon: Jama Cordella MATSU, MD;  Location: [facility];  Service: Cardiovascular;  Laterality: Left;   LOWER EXTREMITY INTERVENTION Left 01/20/2024   Procedure: LOWER EXTREMITY INTERVENTION;  Surgeon: Jama Cordella MATSU, MD;  Location: [facility];  Service: Cardiovascular;  Laterality: Left;   TOTAL ABDOMINAL HYSTERECTOMY      Social History Social History   Tobacco Use   Smoking status: Never   Smokeless tobacco: Never  Vaping Use   Vaping status: Never Used  Substance Use Topics   Alcohol  use: Yes     Alcohol /week: 1.0 standard drink of alcohol     Types: 1 Glasses of wine per week    Comment: 5   Drug use: Never    Family History Family History  Problem Relation Age of Onset   Cancer Mother    Pancreatic cancer Mother    Heart failure Mother    Heart attack Father 31   Heart disease Father     Allergies  Allergen Reactions   Lactose Other (See Comments) and Nausea And Vomiting    Gi upset and diarrhea  Other reaction(s): Other (See Comments)  Gi upset and diarrhea  Other reaction(s): Other (See Comments) Gi upset and diarrhea Gi upset and diarrhea Gi upset and diarrhea    Gi upset and diarrhea    Gi upset and diarrhea Gi upset and diarrhea Gi upset and diarrhea  Gi upset and diarrhea    Gi upset and diarrhea  Other reaction(s): Other (See Comments) Gi upset and diarrhea Gi upset and diarrhea Gi upset and diarrhea  Gi upset and diarrhea  Gi upset and diarrhea Gi upset and diarrhea Gi upset and diarrhea     REVIEW OF SYSTEMS (Negative unless checked)  Constitutional: [] Weight loss  [] Fever  [] Chills Cardiac: [] Chest pain   [] Chest pressure   [] Palpitations   [] Shortness of breath when laying flat   [] Shortness of breath with exertion. Vascular:  [x] Pain in legs with walking   [] Pain in legs at rest  [] History of DVT   [] Phlebitis   [] Swelling in legs   [] Varicose veins   [] Non-healing ulcers Pulmonary:   [] Uses home oxygen   [] Productive cough   [] Hemoptysis   [] Wheeze  [] COPD   [] Asthma Neurologic:  [] Dizziness   [] Seizures   [] History of stroke   [] History of TIA  [] Aphasia   [] Vissual changes   [] Weakness or numbness in arm   [] Weakness or numbness in leg Musculoskeletal:   [] Joint swelling   [] Joint pain   [] Low back pain Hematologic:  [] Easy bruising  [] Easy bleeding   [] Hypercoagulable state   [] Anemic Gastrointestinal:  [] Diarrhea   [] Vomiting  [] Gastroesophageal reflux/heartburn   [] Difficulty swallowing. Genitourinary:  [] Chronic kidney disease   [] Difficult  urination  [] Frequent urination   [] Blood in urine Skin:  [] Rashes   [] Ulcers  Psychological:  [] History of anxiety   []  History of major depression.  Physical Examination  Vitals:   05/27/24 1500  05/27/24 1516  BP: (!) 165/72 (!) 154/73  Weight: 127 lb (57.6 kg)   Height: 5' 8 (1.727 m)    Body mass index is 19.31 kg/m. Gen: WD/WN, NAD Head: [place]/AT, No temporalis wasting.  Ear/Nose/Throat: Hearing grossly intact, nares w/o erythema or drainage Eyes: PER, EOMI, sclera nonicteric.  Neck: Supple, no masses.  No bruit or JVD.  Pulmonary:  Good air movement, no audible wheezing, no use of accessory muscles.  Cardiac: RRR, normal S1, S2, no Murmurs. Vascular:  mild trophic changes, no open wounds Vessel Right Left  Radial Palpable Palpable  PT Palpable Palpable  DP Palpable Not Palpable  Gastrointestinal: soft, non-distended. No guarding/no peritoneal signs.  Musculoskeletal: M/S 5/5 throughout.  No visible deformity.  Neurologic: CN 2-12 intact. Pain and light touch intact in extremities.  Symmetrical.  Speech is fluent. Motor exam as listed above. Psychiatric: Judgment intact, Mood & affect appropriate for pt's clinical situation. Dermatologic: No rashes or ulcers noted.  No changes consistent with cellulitis.   CBC Lab Results  Component Value Date   WBC 9.1 02/02/2024   HGB 9.1 (L) 02/02/2024   HCT 27.7 (L) 02/02/2024   MCV 92.0 02/02/2024   PLT 370 02/02/2024    BMET    Component Value Date/Time   NA 134 (L) 02/02/2024 0530   K 3.9 02/02/2024 0530   CL 104 02/02/2024 0530   CO2 21 (L) 02/02/2024 0530   GLUCOSE 93 02/02/2024 0530   BUN 15 02/02/2024 0530   CREATININE 0.65 02/02/2024 0530   CALCIUM  8.5 (L) 02/02/2024 0530   GFRNONAA >60 02/02/2024 0530   CrCl cannot be calculated (Patient's most recent lab result is older than the maximum 21 days allowed.).  COAG No results found for: INR, PROTIME  Radiology No results found.   Assessment/Plan 1.  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) (Primary) Recommend:   The patient is status post successful angiogram with intervention.  The patient reports that the claudication symptoms and leg pain has improved.   The patient denies lifestyle limiting changes at this point in time.   No further invasive studies, angiography or surgery at this time. The patient should continue walking and begin a more formal exercise program.  The patient should continue antiplatelet therapy and aggressive treatment of the lipid abnormalities   Continued surveillance is indicated as atherosclerosis is likely to progress with time.     Patient should undergo noninvasive studies as ordered. The patient will follow up with me to review the studies.  More frequent monitoring is certainly justified considering all the recurrences that we have struggled with.   She is to see functional medicine at the [facility] clinic later this month.  She will follow-up with me in 1 month to review the findings.  2. Popliteal artery injury, left, sequela Recommend:   The patient is status post successful angiogram with intervention.  The patient reports that the claudication symptoms and leg pain has improved.   The patient denies lifestyle limiting changes at this point in time.   No further invasive studies, angiography or surgery at this time. The patient should continue walking and begin a more formal exercise program.  The patient should continue antiplatelet therapy and aggressive treatment of the lipid abnormalities   Continued surveillance is indicated as atherosclerosis is likely to progress with time.     Patient should undergo noninvasive studies as ordered. The patient will follow up with me to review the studies.  More frequent monitoring is certainly justified considering all the recurrences that we have struggled with.   She is to see functional medicine at  the [facility] clinic later this month.  She will follow-up with me in 1 month to review the findings.  3. Bilateral carotid artery stenosis Recommend:  Given the patient's asymptomatic subcritical stenosis no further invasive testing or surgery at this time.  Duplex ultrasound shows 1-39% stenosis bilaterally.  Continue antiplatelet therapy as prescribed Continue management of CAD, HTN and Hyperlipidemia Healthy heart diet,  encouraged exercise at least 4 times per week  Follow up in 6 weeks with duplex ultrasound and physical exam   4. Basal cell carcinoma (BCC) of skin of other part of face She has a consult on the eighth and we will determine what anticoagulation can be continued.  Potentially we could do just 81 mg of aspirin  daily until she is approved to return to her Eliquis  and Brilinta   5. Mixed hyperlipidemia Continue statin as ordered and reviewed, no changes at this time  6. Coronary artery disease of native artery of native heart with stable angina pectoris (HCC) Continue cardiac and antihypertensive medications as already ordered and reviewed, no changes at this time.  Continue statin as ordered and reviewed, no changes at this time  Nitrates PRN for chest pain  7. Paroxysmal atrial fibrillation (HCC) Continue antiarrhythmia medications as already ordered, these medications have been reviewed and there are no changes at this time.  Continue anticoagulation as ordered by Cardiology Service    Cordella Shawl, MD  05/31/2024 10:14 AM

## 2024-06-01 ENCOUNTER — Encounter (INDEPENDENT_AMBULATORY_CARE_PROVIDER_SITE_OTHER): Payer: Self-pay | Admitting: Vascular Surgery

## 2024-06-08 ENCOUNTER — Encounter (INDEPENDENT_AMBULATORY_CARE_PROVIDER_SITE_OTHER): Payer: Self-pay | Admitting: Vascular Surgery

## 2024-06-10 LAB — VAS US ABI WITH/WO TBI
Left ABI: 1.02
Right ABI: 1.1

## 2024-06-28 ENCOUNTER — Ambulatory Visit: Admitting: Student

## 2024-07-07 NOTE — Progress Notes (Signed)
 MRN : [id number]  Melanie Browning is a 83 y.o. (Jun 21, 1941) female who presents with chief complaint of check circulation.  History of Present Illness:   The patient returns to the office for followup and review status post angiogram with intervention on 01/21/2024.    Procedure:  Percutaneous transluminal angioplasty and stent placement left SFA to 6 mm 2.    Percutaneous transluminal angioplasty and stent placement left tibioperoneal trunk and proximal posterior tibial artery with Esprit stents dilated to 3.5 mm distally and 4 mm proximally 3.     Introduction catheter into the venous branch of the failed saphenous vein bypass 4.    Coil embolization of the venous branch of the failed saphenous vein bypass using Ruby coils   The patient notes stability in the lower extremity symptoms. No interval shortening of the patient's claudication distance and her rest pain symptoms.  She notes that she is walking her dog without difficulty and without symptoms.  No new ulcers or wounds have occurred since the last visit.   There have been no significant changes to the patient's overall health care.   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   No documented history of amaurosis fugax or recent TIA symptoms. There are no recent neurological changes noted. No documented history of DVT, PE or superficial thrombophlebitis. The patient denies recent episodes of angina or shortness of breath.    Previous ABI's Rt=1.12 and Lt=0.92 (triphasic signal is maintained) (previous ABI's Rt=1.10 and Lt=1.02)    Duplex ultrasound dated 05/27/2024 demonstrates the current arterial reconstruction is patent there is a mild to moderate increase in velocities at the level of the origin of the SMA.   Duplex ultrasound of the carotid arteries bilaterally demonstrates 1 to 39%  stenosis bilaterally essentially a normal study.  No outpatient medications have been marked as taking for the 07/08/24 encounter (Appointment) with Jama, Cordella MATSU, MD.    Past Medical History:  Diagnosis Date   Aortic atherosclerosis    Aortic stenosis 06/26/2023   a.) TTE 06/26/2023: mild-mod AS (MPG 18.2 mmHg; AVA 0.73 cm2)   Arthritis    Atherosclerosis of native artery of left lower extremity with intermittent claudication    a.) multiple vascular interventions (PTA, stenting, arthrectomy, and thrombectomy) of LEFT SFA, LEFT popliteal, LEFT posterior tibial, and LEFT anterior tibial arteries due to critical limb ischemia   CAD (coronary artery disease) 11/09/2020   a.) cCTA 11/09/2020: Ca2+ 539 (83rd %'ile for age/sex/race match control); <25% dRCA & pLCx, >70% pLAD; b.) LHC/PCI 11/15/2020: 90% pLAD (2.75 x 18 mm Resolute Onyx DES); c.) MV 06/26/2023: no sig ischemia   Cervical spondylosis    Colitis    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)    a.) s/p multiple vascular interventions   Diastolic dysfunction 06/26/2023   a.) TTE 06/26/2023: EF 60-65%, no RWMAs, G2DD, sev AoV thickening with mild AR, mild-mod AS (MPG 18.2 mmHg; AVA 0.73 cm2)   Dry eye    bilateral when living in [address]  Dyslipidemia    History of cervical cancer    a.) s/p TAH   HPV (human papilloma virus) infection    Hyperlipidemia    Hypothyroidism    IGT (impaired glucose tolerance)    Left upper lobe pulmonary nodule    Long term current use of aspirin     Long term current use of ticagrelor  therapy    Macrocytic anemia    Osteoporosis    a.) Tx'd with zolodronic acid (Reclast) infusions   PAD (peripheral artery disease)    Polymyalgia rheumatica    Skin cancer of nose    Transaminitis    Vaginal dysplasia     Past Surgical History:  Procedure Laterality Date   COLONOSCOPY WITH PROPOFOL  N/A 10/04/2020   Procedure: COLONOSCOPY WITH PROPOFOL ;  Surgeon: Toledo, Ladell POUR, MD;   Location: [facility];  Service: Gastroenterology;  Laterality: N/A;   COLPOSCOPY     CORONARY STENT INTERVENTION N/A 11/15/2020   Procedure: CORONARY STENT INTERVENTION;  Surgeon: Florencio Cara BIRCH, MD;  Location: [facility];  Service: Cardiovascular;  Laterality: N/A;   FEMORAL-POPLITEAL BYPASS GRAFT Left 07/16/2023   Procedure: BYPASS GRAFT FEMORAL-POPLITEAL ARTERY (FEMORAL-POSTERIOR TIBIAL BYPASS W/ SAPHENOUS VEIN- [id number]);  Surgeon: Jama Cordella MATSU, MD;  Location: [facility];  Service: Vascular;  Laterality: Left;   KNEE ARTHROSCOPY Left    LAPAROSCOPIC HYSTERECTOMY     LEFT HEART CATH AND CORONARY ANGIOGRAPHY N/A 11/15/2020   Procedure: LEFT HEART CATH AND CORONARY ANGIOGRAPHY;  Surgeon: Florencio Cara BIRCH, MD;  Location: [facility];  Service: Cardiovascular;  Laterality: N/A;   LOWER EXTREMITY ANGIOGRAPHY Left 09/05/2020   Procedure: LOWER EXTREMITY ANGIOGRAPHY;  Surgeon: Jama Cordella MATSU, MD;  Location: [facility];  Service: Cardiovascular;  Laterality: Left;   LOWER EXTREMITY ANGIOGRAPHY Left 04/10/2021   Procedure: LOWER EXTREMITY ANGIOGRAPHY;  Surgeon: Jama Cordella MATSU, MD;  Location: [facility];  Service: Cardiovascular;  Laterality: Left;   LOWER EXTREMITY ANGIOGRAPHY Left 11/20/2021   Procedure: Lower Extremity Angiography;  Surgeon: Jama Cordella MATSU, MD;  Location: [facility];  Service: Cardiovascular;  Laterality: Left;   LOWER EXTREMITY ANGIOGRAPHY Left 06/05/2022   Procedure: Lower Extremity Angiography;  Surgeon: Jama Cordella MATSU, MD;  Location: [facility];  Service: Cardiovascular;  Laterality: Left;   LOWER EXTREMITY ANGIOGRAPHY Left 07/23/2022   Procedure: Lower Extremity Angiography;  Surgeon: Jama Cordella MATSU, MD;  Location: [facility];  Service: Cardiovascular;  Laterality: Left;   LOWER EXTREMITY ANGIOGRAPHY Left 12/27/2022   Procedure: Lower Extremity Angiography;  Surgeon: Jama Cordella MATSU, MD;  Location: [facility];  Service: Cardiovascular;  Laterality: Left;   LOWER EXTREMITY ANGIOGRAPHY Left 06/17/2023   Procedure: Lower Extremity Angiography;  Surgeon: Jama Cordella MATSU, MD;  Location: [facility];  Service: Cardiovascular;  Laterality: Left;   LOWER EXTREMITY ANGIOGRAPHY Left 01/20/2024   Procedure: Lower Extremity Angiography;  Surgeon: Jama Cordella MATSU, MD;  Location: [facility];  Service: Cardiovascular;  Laterality: Left;   LOWER EXTREMITY ANGIOGRAPHY Left 01/21/2024   Procedure: Lower Extremity Angiography;  Surgeon: Jama Cordella MATSU, MD;  Location: [facility];  Service: Cardiovascular;  Laterality: Left;   LOWER EXTREMITY INTERVENTION Left 01/20/2024   Procedure: LOWER EXTREMITY INTERVENTION;  Surgeon: Jama Cordella MATSU, MD;  Location: [facility];  Service: Cardiovascular;  Laterality: Left;   TOTAL ABDOMINAL HYSTERECTOMY      Social History Social History   Tobacco  Use   Smoking status: Never   Smokeless tobacco: Never  Vaping Use   Vaping status: Never Used  Substance Use Topics   Alcohol  use: Yes    Alcohol /week: 1.0 standard drink of alcohol     Types: 1 Glasses of wine per week    Comment: 5   Drug use: Never    Family History Family History  Problem Relation Age of Onset   Cancer Mother    Pancreatic cancer Mother    Heart failure Mother    Heart attack Father 50   Heart disease Father     Allergies  Allergen Reactions   Lactose Other (See Comments) and Nausea And Vomiting    Gi upset and diarrhea  Other reaction(s): Other (See Comments)  Gi upset and diarrhea  Other reaction(s): Other (See Comments) Gi upset and diarrhea Gi upset and diarrhea Gi upset and diarrhea    Gi upset and diarrhea    Gi upset and diarrhea Gi upset and diarrhea Gi upset and diarrhea  Gi upset and diarrhea    Gi upset and diarrhea  Other reaction(s): Other (See Comments) Gi upset and diarrhea Gi  upset and diarrhea Gi upset and diarrhea  Gi upset and diarrhea  Gi upset and diarrhea Gi upset and diarrhea Gi upset and diarrhea     REVIEW OF SYSTEMS (Negative unless checked)  Constitutional: [] Weight loss  [] Fever  [] Chills Cardiac: [] Chest pain   [] Chest pressure   [] Palpitations   [] Shortness of breath when laying flat   [] Shortness of breath with exertion. Vascular:  [x] Pain in legs with walking   [] Pain in legs at rest  [] History of DVT   [] Phlebitis   [] Swelling in legs   [] Varicose veins   [] Non-healing ulcers Pulmonary:   [] Uses home oxygen   [] Productive cough   [] Hemoptysis   [] Wheeze  [] COPD   [] Asthma Neurologic:  [] Dizziness   [] Seizures   [] History of stroke   [] History of TIA  [] Aphasia   [] Vissual changes   [] Weakness or numbness in arm   [] Weakness or numbness in leg Musculoskeletal:   [] Joint swelling   [] Joint pain   [] Low back pain Hematologic:  [] Easy bruising  [] Easy bleeding   [] Hypercoagulable state   [] Anemic Gastrointestinal:  [] Diarrhea   [] Vomiting  [] Gastroesophageal reflux/heartburn   [] Difficulty swallowing. Genitourinary:  [] Chronic kidney disease   [] Difficult urination  [] Frequent urination   [] Blood in urine Skin:  [] Rashes   [] Ulcers  Psychological:  [] History of anxiety   []  History of major depression.  Physical Examination  There were no vitals filed for this visit. There is no height or weight on file to calculate BMI. Gen: WD/WN, NAD Head: [place]/AT, No temporalis wasting.  Ear/Nose/Throat: Hearing grossly intact, nares w/o erythema or drainage Eyes: PER, EOMI, sclera nonicteric.  Neck: Supple, no masses.  No bruit or JVD.  Pulmonary:  Good air movement, no audible wheezing, no use of accessory muscles.  Cardiac: RRR, normal S1, S2, no Murmurs. Vascular:  mild trophic changes, no open wounds Vessel Right Left  Radial Palpable Palpable  PT Palpable Trace Palpable  DP  Palpable Not Palpable  Gastrointestinal: soft, non-distended. No guarding/no  peritoneal signs.  Musculoskeletal: M/S 5/5 throughout.  No visible deformity.  Neurologic: CN 2-12 intact. Pain and light touch intact in extremities.  Symmetrical.  Speech is fluent. Motor exam as listed above. Psychiatric: Judgment intact, Mood & affect appropriate for pt's clinical situation. Dermatologic: No rashes or ulcers noted.  No changes  consistent with cellulitis.   CBC Lab Results  Component Value Date   WBC 9.1 02/02/2024   HGB 9.1 (L) 02/02/2024   HCT 27.7 (L) 02/02/2024   MCV 92.0 02/02/2024   PLT 370 02/02/2024    BMET    Component Value Date/Time   NA 134 (L) 02/02/2024 0530   K 3.9 02/02/2024 0530   CL 104 02/02/2024 0530   CO2 21 (L) 02/02/2024 0530   GLUCOSE 93 02/02/2024 0530   BUN 15 02/02/2024 0530   CREATININE 0.65 02/02/2024 0530   CALCIUM  8.5 (L) 02/02/2024 0530   GFRNONAA >60 02/02/2024 0530   CrCl cannot be calculated (Patient's most recent lab result is older than the maximum 21 days allowed.).  COAG No results found for: INR, PROTIME  Radiology No results found.   Assessment/Plan 1. Atherosclerosis of native artery of left lower extremity with intermittent claudication (Primary) Recommend:   The patient is status post successful angiogram with intervention.  The patient reports that the claudication symptoms and leg pain has improved.   The patient denies lifestyle limiting changes at this point in time.   No further invasive studies, angiography or surgery at this time. The patient should continue walking and begin a more formal exercise program.  The patient should continue antiplatelet therapy and aggressive treatment of the lipid abnormalities   Continued surveillance is indicated as atherosclerosis is likely to progress with time.     Patient should undergo noninvasive studies as ordered. The patient will follow up with me to review the studies.  More frequent monitoring is certainly justified considering all the recurrences  that we have struggled with.   She is to see functional medicine at the [facility] later this month.  She will follow-up with me in 6 weeks to review the findings. - VAS US  ABI WITH/WO TBI; Future - VAS US  LOWER EXTREMITY ARTERIAL DUPLEX; Future  2. Bilateral carotid artery stenosis Recommend:  Given the patient's asymptomatic subcritical stenosis no further invasive testing or surgery at this time.  Duplex ultrasound shows 1-39% stenosis bilaterally.  Continue antiplatelet therapy as prescribed Continue management of CAD, HTN and Hyperlipidemia Healthy heart diet,  encouraged exercise at least 4 times per week  Follow up in 24 months with duplex ultrasound and physical exam   3. Chronic venous insufficiency No surgery or intervention at this point in time.   The patient is CEAP [id number]   I have discussed with the patient venous insufficiency and why it  causes symptoms. I have discussed with the patient the chronic skin changes that accompany venous insufficiency and the long term sequela such as infection and ulceration.  Patient will begin wearing graduated compression stockings or compression wraps on a daily basis.  The patient will put the compression on first thing in the morning and removing them in the evening. The patient is instructed specifically not to sleep in the compression.    In addition, behavioral modification including several periods of elevation of the lower extremities during the day will be continued. I have demonstrated that proper elevation is a position with the ankles at heart level.  The patient is instructed to begin routine exercise, especially walking on a daily basis  4. Coronary artery disease of native artery of native heart with stable angina pectoris Continue cardiac and antihypertensive medications as already ordered and reviewed, no changes at this time.  Continue statin as ordered and reviewed, no changes at this time  Nitrates PRN  for chest  pain  5. Paroxysmal atrial fibrillation (HCC) Continue antiarrhythmia medications as already ordered, these medications have been reviewed and there are no changes at this time.  Continue anticoagulation as ordered by Cardiology Service  6. Mixed hyperlipidemia Continue statin as ordered and reviewed, no changes at this time     Cordella Shawl, MD  07/07/2024 3:14 PM

## 2024-07-08 ENCOUNTER — Other Ambulatory Visit (INDEPENDENT_AMBULATORY_CARE_PROVIDER_SITE_OTHER)

## 2024-07-08 ENCOUNTER — Ambulatory Visit (INDEPENDENT_AMBULATORY_CARE_PROVIDER_SITE_OTHER)

## 2024-07-08 ENCOUNTER — Encounter (INDEPENDENT_AMBULATORY_CARE_PROVIDER_SITE_OTHER): Payer: Self-pay | Admitting: Vascular Surgery

## 2024-07-08 ENCOUNTER — Ambulatory Visit (INDEPENDENT_AMBULATORY_CARE_PROVIDER_SITE_OTHER): Admitting: Vascular Surgery

## 2024-07-08 VITALS — BP 148/74 | HR 64 | Resp 18 | Ht 68.0 in | Wt 128.2 lb

## 2024-07-08 DIAGNOSIS — I70212 Atherosclerosis of native arteries of extremities with intermittent claudication, left leg: Secondary | ICD-10-CM

## 2024-07-08 DIAGNOSIS — S85002S Unspecified injury of popliteal artery, left leg, sequela: Secondary | ICD-10-CM

## 2024-07-08 DIAGNOSIS — I6523 Occlusion and stenosis of bilateral carotid arteries: Secondary | ICD-10-CM

## 2024-07-08 DIAGNOSIS — I25118 Atherosclerotic heart disease of native coronary artery with other forms of angina pectoris: Secondary | ICD-10-CM

## 2024-07-08 DIAGNOSIS — I872 Venous insufficiency (chronic) (peripheral): Secondary | ICD-10-CM

## 2024-07-08 DIAGNOSIS — I480 Paroxysmal atrial fibrillation: Secondary | ICD-10-CM

## 2024-07-08 DIAGNOSIS — E782 Mixed hyperlipidemia: Secondary | ICD-10-CM

## 2024-07-11 ENCOUNTER — Encounter (INDEPENDENT_AMBULATORY_CARE_PROVIDER_SITE_OTHER): Payer: Self-pay | Admitting: Vascular Surgery

## 2024-07-12 LAB — VAS US ABI WITH/WO TBI
Left ABI: 0.92
Right ABI: 1.12

## 2024-07-15 ENCOUNTER — Encounter (INDEPENDENT_AMBULATORY_CARE_PROVIDER_SITE_OTHER): Payer: Self-pay | Admitting: Vascular Surgery

## 2024-07-16 ENCOUNTER — Other Ambulatory Visit (INDEPENDENT_AMBULATORY_CARE_PROVIDER_SITE_OTHER): Payer: Self-pay

## 2024-07-16 MED ORDER — APIXABAN 5 MG PO TABS: 5.0000 mg | ORAL_TABLET | Freq: Two times a day (BID) | ORAL | 3 refills | Status: AC

## 2024-07-20 ENCOUNTER — Other Ambulatory Visit (INDEPENDENT_AMBULATORY_CARE_PROVIDER_SITE_OTHER): Payer: Self-pay | Admitting: Vascular Surgery

## 2024-07-20 DIAGNOSIS — S85002S Unspecified injury of popliteal artery, left leg, sequela: Secondary | ICD-10-CM

## 2024-07-20 DIAGNOSIS — I2510 Atherosclerotic heart disease of native coronary artery without angina pectoris: Secondary | ICD-10-CM

## 2024-08-04 ENCOUNTER — Encounter: Payer: Self-pay | Admitting: Cardiovascular Disease

## 2024-08-04 DIAGNOSIS — I350 Nonrheumatic aortic (valve) stenosis: Secondary | ICD-10-CM

## 2024-08-05 NOTE — Telephone Encounter (Signed)
 Schedule an echocardiogram for paroxysmal atrial fibrillation and aortic stenosis.

## 2024-08-06 ENCOUNTER — Encounter (INDEPENDENT_AMBULATORY_CARE_PROVIDER_SITE_OTHER): Payer: Self-pay | Admitting: Vascular Surgery

## 2024-08-06 NOTE — Telephone Encounter (Signed)
Left message on VM to schedule.

## 2024-08-09 ENCOUNTER — Encounter (INDEPENDENT_AMBULATORY_CARE_PROVIDER_SITE_OTHER): Payer: Self-pay | Admitting: Vascular Surgery

## 2024-08-09 ENCOUNTER — Ambulatory Visit (INDEPENDENT_AMBULATORY_CARE_PROVIDER_SITE_OTHER)

## 2024-08-09 ENCOUNTER — Telehealth (INDEPENDENT_AMBULATORY_CARE_PROVIDER_SITE_OTHER): Payer: Self-pay

## 2024-08-09 ENCOUNTER — Other Ambulatory Visit (INDEPENDENT_AMBULATORY_CARE_PROVIDER_SITE_OTHER): Payer: Self-pay | Admitting: Vascular Surgery

## 2024-08-09 ENCOUNTER — Ambulatory Visit (INDEPENDENT_AMBULATORY_CARE_PROVIDER_SITE_OTHER): Admitting: Vascular Surgery

## 2024-08-09 VITALS — BP 174/77 | HR 60 | Resp 16 | Wt 122.0 lb

## 2024-08-09 DIAGNOSIS — Z9889 Other specified postprocedural states: Secondary | ICD-10-CM | POA: Diagnosis not present

## 2024-08-09 DIAGNOSIS — I480 Paroxysmal atrial fibrillation: Secondary | ICD-10-CM | POA: Diagnosis not present

## 2024-08-09 DIAGNOSIS — I739 Peripheral vascular disease, unspecified: Secondary | ICD-10-CM

## 2024-08-09 DIAGNOSIS — I25118 Atherosclerotic heart disease of native coronary artery with other forms of angina pectoris: Secondary | ICD-10-CM | POA: Diagnosis not present

## 2024-08-09 DIAGNOSIS — M79605 Pain in left leg: Secondary | ICD-10-CM | POA: Diagnosis not present

## 2024-08-09 DIAGNOSIS — I70229 Atherosclerosis of native arteries of extremities with rest pain, unspecified extremity: Secondary | ICD-10-CM

## 2024-08-09 DIAGNOSIS — E782 Mixed hyperlipidemia: Secondary | ICD-10-CM | POA: Diagnosis not present

## 2024-08-09 NOTE — Telephone Encounter (Signed)
 Patient called into nurse line requesting that she be moved up for her Ultrasound that is currently scheduled next week, she would like to come in today if possible and would also like to follow up with a provider,  she reports she thinks her left leg has a blockage she is having a 9/10 pain when walking and is only able to walk short distances without stopping to rest pain is spreading from calf  to foot.

## 2024-08-09 NOTE — Telephone Encounter (Signed)
 Spoke with the patient and she is scheduled for a left leg angio with intervention with a 12:30 pm arrival time to the [facility]. Pre-procedure instructions were discussed and sent to Mychart. Patient stated she never stops her blood thinners.

## 2024-08-09 NOTE — H&P (View-Only) (Signed)
 MRN : [id number]  Melanie Browning is a 83 y.o. (Nov 26, 1940) female who presents with chief complaint of check circulation.  History of Present Illness:   The patient returns to the office sooner than expected for a significant deterioration in her left lower extremities symptoms.   The patient notes that there has been a significant deterioration in the left lower extremity symptoms.  The patient notes interval shortening of their claudication distance and development of rest pain symptoms.  She noted some very mild changes several weeks ago but they have become progressively worse over the last several days.  No new ulcers or wounds have occurred since the last visit.  There have been no significant changes to the patient's overall health care.  The patient denies amaurosis fugax or recent TIA symptoms. There are no recent neurological changes noted. There is no history of DVT, PE or superficial thrombophlebitis. The patient denies recent episodes of angina or shortness of breath.   Duplex US  of the lower extremity arterial system shows occlusion of the SFA and popliteal  Current Meds  Medication Sig   amiodarone  (PACERONE ) 200 MG tablet Take 1 tablet (200 mg total) by mouth daily.   apixaban  (ELIQUIS ) 5 MG TABS tablet Take 1 tablet (5 mg total) by mouth 2 (two) times daily.   ascorbic acid  (VITAMIN C) 1000 MG tablet Take 2,000-3,000 mg by mouth as needed.   B Complex Vitamins (VITAMIN B-COMPLEX) TABS Take 1 tablet by mouth daily.   budesonide  (ENTOCORT EC ) 3 MG 24 hr capsule Take 3 capsules (9 mg total) by mouth every morning.   Cholecalciferol  125 MCG (5000 UT) capsule Take 5,000 Units by mouth.   diphenoxylate -atropine  (LOMOTIL ) 2.5-0.025 MG tablet TAKE 1 TABLET BY MOUTH 3 TIMES DAILY AS NEEDED FOR DIARRHEA.   estradiol  (ESTRACE ) 0.1 MG/GM vaginal cream Insert 1/4 applicator full into vagina at night twice a week    HYDROcodone -acetaminophen  (NORCO) 10-325 MG tablet Take 1 tablet by mouth as needed.   levothyroxine  (SYNTHROID ) 75 MCG tablet Take 75 mcg by mouth daily before breakfast.    losartan  (COZAAR ) 25 MG tablet Take 25 mg by mouth daily.   ticagrelor  (BRILINTA ) 90 MG TABS tablet TAKE 1 TABLET BY MOUTH TWICE DAILY    Past Medical History:  Diagnosis Date   Aortic atherosclerosis    Aortic stenosis 06/26/2023   a.) TTE 06/26/2023: mild-mod AS (MPG 18.2 mmHg; AVA 0.73 cm2)   Arthritis    Atherosclerosis of native artery of left lower extremity with intermittent claudication    a.) multiple vascular interventions (PTA, stenting, arthrectomy, and thrombectomy) of LEFT SFA, LEFT popliteal, LEFT posterior tibial, and LEFT anterior tibial arteries due to critical limb ischemia   CAD (coronary artery disease) 11/09/2020   a.) cCTA 11/09/2020: Ca2+ 539 (83rd %'ile for age/sex/race match control); <25% dRCA & pLCx, >70% pLAD; b.) LHC/PCI 11/15/2020: 90% pLAD (2.75 x 18 mm Resolute Onyx DES); c.) MV 06/26/2023: no sig ischemia   Cervical spondylosis    Colitis    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)    a.) s/p multiple  vascular interventions   Diastolic dysfunction 06/26/2023   a.) TTE 06/26/2023: EF 60-65%, no RWMAs, G2DD, sev AoV thickening with mild AR, mild-mod AS (MPG 18.2 mmHg; AVA 0.73 cm2)   [place]   Dyslipidemia    History of cervical cancer    a.) s/p TAH   HPV (human papilloma virus) infection    Hyperlipidemia    Hypothyroidism    IGT (impaired glucose tolerance)    Left upper lobe pulmonary nodule    Long term current use of aspirin     Long term current use of ticagrelor  therapy    Macrocytic anemia    Osteoporosis    a.) Tx'd with zolodronic acid (Reclast) infusions   PAD (peripheral artery disease)    Polymyalgia rheumatica    Skin cancer of nose    Transaminitis    Vaginal dysplasia     Past Surgical History:   Procedure Laterality Date   COLONOSCOPY WITH PROPOFOL  N/A 10/04/2020   Procedure: COLONOSCOPY WITH PROPOFOL ;  Surgeon: Toledo, Ladell POUR, MD;  Location: [facility];  Service: Gastroenterology;  Laterality: N/A;   COLPOSCOPY     CORONARY STENT INTERVENTION N/A 11/15/2020   Procedure: CORONARY STENT INTERVENTION;  Surgeon: Florencio Cara BIRCH, MD;  Location: [facility];  Service: Cardiovascular;  Laterality: N/A;   FEMORAL-POPLITEAL BYPASS GRAFT Left 07/16/2023   Procedure: BYPASS GRAFT FEMORAL-POPLITEAL ARTERY (FEMORAL-POSTERIOR TIBIAL BYPASS W/ SAPHENOUS VEIN- [id number]);  Surgeon: Jama Cordella MATSU, MD;  Location: [facility];  Service: Vascular;  Laterality: Left;   KNEE ARTHROSCOPY Left    LAPAROSCOPIC HYSTERECTOMY     LEFT HEART CATH AND CORONARY ANGIOGRAPHY N/A 11/15/2020   Procedure: LEFT HEART CATH AND CORONARY ANGIOGRAPHY;  Surgeon: Florencio Cara BIRCH, MD;  Location: [facility];  Service: Cardiovascular;  Laterality: N/A;   LOWER EXTREMITY ANGIOGRAPHY Left 09/05/2020   Procedure: LOWER EXTREMITY ANGIOGRAPHY;  Surgeon: Jama Cordella MATSU, MD;  Location: [facility];  Service: Cardiovascular;  Laterality: Left;   LOWER EXTREMITY ANGIOGRAPHY Left 04/10/2021   Procedure: LOWER EXTREMITY ANGIOGRAPHY;  Surgeon: Jama Cordella MATSU, MD;  Location: [facility];  Service: Cardiovascular;  Laterality: Left;   LOWER EXTREMITY ANGIOGRAPHY Left 11/20/2021   Procedure: Lower Extremity Angiography;  Surgeon: Jama Cordella MATSU, MD;  Location: [facility];  Service: Cardiovascular;  Laterality: Left;   LOWER EXTREMITY ANGIOGRAPHY Left 06/05/2022   Procedure: Lower Extremity Angiography;  Surgeon: Jama Cordella MATSU, MD;  Location: [facility];  Service: Cardiovascular;  Laterality: Left;   LOWER EXTREMITY ANGIOGRAPHY Left 07/23/2022   Procedure: Lower Extremity Angiography;  Surgeon: Jama Cordella MATSU, MD;  Location: [facility];  Service:  Cardiovascular;  Laterality: Left;   LOWER EXTREMITY ANGIOGRAPHY Left 12/27/2022   Procedure: Lower Extremity Angiography;  Surgeon: Jama Cordella MATSU, MD;  Location: [facility];  Service: Cardiovascular;  Laterality: Left;   LOWER EXTREMITY ANGIOGRAPHY Left 06/17/2023   Procedure: Lower Extremity Angiography;  Surgeon: Jama Cordella MATSU, MD;  Location: [facility];  Service: Cardiovascular;  Laterality: Left;   LOWER EXTREMITY ANGIOGRAPHY Left 01/20/2024   Procedure: Lower Extremity Angiography;  Surgeon: Jama Cordella MATSU, MD;  Location: [facility];  Service: Cardiovascular;  Laterality: Left;   LOWER EXTREMITY ANGIOGRAPHY Left 01/21/2024   Procedure: Lower Extremity Angiography;  Surgeon: Jama Cordella MATSU, MD;  Location: [facility];  Service: Cardiovascular;  Laterality: Left;  LOWER EXTREMITY INTERVENTION Left 01/20/2024   Procedure: LOWER EXTREMITY INTERVENTION;  Surgeon: Jama Cordella MATSU, MD;  Location: [facility];  Service: Cardiovascular;  Laterality: Left;   TOTAL ABDOMINAL HYSTERECTOMY      Social History Social History   Tobacco Use   Smoking status: Never   Smokeless tobacco: Never  Vaping Use   Vaping status: Never Used  Substance Use Topics   Alcohol  use: Yes    Alcohol /week: 1.0 standard drink of alcohol     Types: 1 Glasses of wine per week    Comment: 5   Drug use: Never    Family History Family History  Problem Relation Age of Onset   Cancer Mother    Pancreatic cancer Mother    Heart failure Mother    Heart attack Father 20   Heart disease Father     Allergies  Allergen Reactions   Lactose Other (See Comments) and Nausea And Vomiting    Gi upset and diarrhea  Other reaction(s): Other (See Comments)  Gi upset and diarrhea  Other reaction(s): Other (See Comments) Gi upset and diarrhea Gi upset and diarrhea Gi upset and diarrhea    Gi upset and diarrhea    Gi upset and diarrhea Gi upset and  diarrhea Gi upset and diarrhea  Gi upset and diarrhea    Gi upset and diarrhea  Other reaction(s): Other (See Comments) Gi upset and diarrhea Gi upset and diarrhea Gi upset and diarrhea  Gi upset and diarrhea  Gi upset and diarrhea Gi upset and diarrhea Gi upset and diarrhea     REVIEW OF SYSTEMS (Negative unless checked)  Constitutional: [] Weight loss  [] Fever  [] Chills Cardiac: [] Chest pain   [] Chest pressure   [] Palpitations   [] Shortness of breath when laying flat   [] Shortness of breath with exertion. Vascular:  [x] Pain in legs with walking   [] Pain in legs at rest  [] History of DVT   [] Phlebitis   [] Swelling in legs   [] Varicose veins   [] Non-healing ulcers Pulmonary:   [] Uses home oxygen   [] Productive cough   [] Hemoptysis   [] Wheeze  [] COPD   [] Asthma Neurologic:  [] Dizziness   [] Seizures   [] History of stroke   [] History of TIA  [] Aphasia   [] Vissual changes   [] Weakness or numbness in arm   [] Weakness or numbness in leg Musculoskeletal:   [] Joint swelling   [] Joint pain   [] Low back pain Hematologic:  [] Easy bruising  [] Easy bleeding   [] Hypercoagulable state   [] Anemic Gastrointestinal:  [] Diarrhea   [] Vomiting  [] Gastroesophageal reflux/heartburn   [] Difficulty swallowing. Genitourinary:  [] Chronic kidney disease   [] Difficult urination  [] Frequent urination   [] Blood in urine Skin:  [] Rashes   [] Ulcers  Psychological:  [] History of anxiety   []  History of major depression.  Physical Examination  Vitals:   08/09/24 1154  BP: (!) 174/77  Pulse: 60  Resp: 16  Weight: 122 lb (55.3 kg)   Body mass index is 18.55 kg/m. Gen: WD/WN, NAD Head: [place]/AT, No temporalis wasting.  Ear/Nose/Throat: Hearing grossly intact, nares w/o erythema or drainage Eyes: PER, EOMI, sclera nonicteric.  Neck: Supple, no masses.  No bruit or JVD.  Pulmonary:  Good air movement, no audible wheezing, no use of accessory muscles.  Cardiac: RRR, normal S1, S2, no Murmurs. Vascular:  mild trophic  changes, no open wounds Vessel Right Left  Radial Palpable Palpable  PT Palpable Not Palpable  DP  Palpable Not Palpable  Gastrointestinal: soft, non-distended.  No guarding/no peritoneal signs.  Musculoskeletal: M/S 5/5 throughout.  No visible deformity.  Neurologic: CN 2-12 intact. Pain and light touch intact in extremities.  Symmetrical.  Speech is fluent. Motor exam as listed above. Psychiatric: Judgment intact, Mood & affect appropriate for pt's clinical situation. Dermatologic: No rashes or ulcers noted.  No changes consistent with cellulitis.   CBC Lab Results  Component Value Date   WBC 9.1 02/02/2024   HGB 9.1 (L) 02/02/2024   HCT 27.7 (L) 02/02/2024   MCV 92.0 02/02/2024   PLT 370 02/02/2024    BMET    Component Value Date/Time   NA 134 (L) 02/02/2024 0530   K 3.9 02/02/2024 0530   CL 104 02/02/2024 0530   CO2 21 (L) 02/02/2024 0530   GLUCOSE 93 02/02/2024 0530   BUN 15 02/02/2024 0530   CREATININE 0.65 02/02/2024 0530   CALCIUM  8.5 (L) 02/02/2024 0530   GFRNONAA >60 02/02/2024 0530   CrCl cannot be calculated (Patient's most recent lab result is older than the maximum 21 days allowed.).  COAG No results found for: INR, PROTIME  Radiology No results found.   Assessment/Plan 1. Atherosclerosis of artery of extremity with rest pain (HCC) (Primary) Recommend:  The patient has evidence of severe atherosclerotic changes of both lower extremities with rest pain that is associated with preulcerative changes and impending tissue loss of the left foot.  This represents a limb threatening ischemia and places the patient at the risk for limb left limb loss.  Patient should undergo angiography of the left lower extremity with the hope for intervention for limb salvage.  The risks and benefits as well as the alternative therapies was discussed in detail with the patient.  All questions were answered.  Patient agrees to proceed with left lower extremity  angiography.  The patient will follow up with me in the office after the procedure.   2. Critical lower limb ischemia (HCC) Recommend:  The patient has evidence of severe atherosclerotic changes of both lower extremities with rest pain that is associated with preulcerative changes and impending tissue loss of the left foot.  This represents a limb threatening ischemia and places the patient at the risk for limb left limb loss.  Patient should undergo angiography of the left lower extremity with the hope for intervention for limb salvage.  The risks and benefits as well as the alternative therapies was discussed in detail with the patient.  All questions were answered.  Patient agrees to proceed with left lower extremity angiography.  The patient will follow up with me in the office after the procedure.   3. Coronary artery disease of native artery of native heart with stable angina pectoris Continue cardiac and antihypertensive medications as already ordered and reviewed, no changes at this time.  Continue statin as ordered and reviewed, no changes at this time  Nitrates PRN for chest pain  4. Paroxysmal atrial fibrillation (HCC) Continue antiarrhythmia medications as already ordered, these medications have been reviewed and there are no changes at this time.  Continue anticoagulation as ordered by Cardiology Service  5. Mixed hyperlipidemia Continue statin as ordered and reviewed, no changes at this time d   Cordella Shawl, MD  08/09/2024 12:54 PM

## 2024-08-09 NOTE — Telephone Encounter (Signed)
 Dr.  Jama would like us  to get her in today with him with ABI

## 2024-08-09 NOTE — Progress Notes (Signed)
 MRN : [id number]  Melanie Browning is a 83 y.o. (Nov 26, 1940) female who presents with chief complaint of check circulation.  History of Present Illness:   The patient returns to the office sooner than expected for a significant deterioration in her left lower extremities symptoms.   The patient notes that there has been a significant deterioration in the left lower extremity symptoms.  The patient notes interval shortening of their claudication distance and development of rest pain symptoms.  She noted some very mild changes several weeks ago but they have become progressively worse over the last several days.  No new ulcers or wounds have occurred since the last visit.  There have been no significant changes to the patient's overall health care.  The patient denies amaurosis fugax or recent TIA symptoms. There are no recent neurological changes noted. There is no history of DVT, PE or superficial thrombophlebitis. The patient denies recent episodes of angina or shortness of breath.   Duplex US  of the lower extremity arterial system shows occlusion of the SFA and popliteal  Current Meds  Medication Sig   amiodarone  (PACERONE ) 200 MG tablet Take 1 tablet (200 mg total) by mouth daily.   apixaban  (ELIQUIS ) 5 MG TABS tablet Take 1 tablet (5 mg total) by mouth 2 (two) times daily.   ascorbic acid  (VITAMIN C) 1000 MG tablet Take 2,000-3,000 mg by mouth as needed.   B Complex Vitamins (VITAMIN B-COMPLEX) TABS Take 1 tablet by mouth daily.   budesonide  (ENTOCORT EC ) 3 MG 24 hr capsule Take 3 capsules (9 mg total) by mouth every morning.   Cholecalciferol  125 MCG (5000 UT) capsule Take 5,000 Units by mouth.   diphenoxylate -atropine  (LOMOTIL ) 2.5-0.025 MG tablet TAKE 1 TABLET BY MOUTH 3 TIMES DAILY AS NEEDED FOR DIARRHEA.   estradiol  (ESTRACE ) 0.1 MG/GM vaginal cream Insert 1/4 applicator full into vagina at night twice a week    HYDROcodone -acetaminophen  (NORCO) 10-325 MG tablet Take 1 tablet by mouth as needed.   levothyroxine  (SYNTHROID ) 75 MCG tablet Take 75 mcg by mouth daily before breakfast.    losartan  (COZAAR ) 25 MG tablet Take 25 mg by mouth daily.   ticagrelor  (BRILINTA ) 90 MG TABS tablet TAKE 1 TABLET BY MOUTH TWICE DAILY    Past Medical History:  Diagnosis Date   Aortic atherosclerosis    Aortic stenosis 06/26/2023   a.) TTE 06/26/2023: mild-mod AS (MPG 18.2 mmHg; AVA 0.73 cm2)   Arthritis    Atherosclerosis of native artery of left lower extremity with intermittent claudication    a.) multiple vascular interventions (PTA, stenting, arthrectomy, and thrombectomy) of LEFT SFA, LEFT popliteal, LEFT posterior tibial, and LEFT anterior tibial arteries due to critical limb ischemia   CAD (coronary artery disease) 11/09/2020   a.) cCTA 11/09/2020: Ca2+ 539 (83rd %'ile for age/sex/race match control); <25% dRCA & pLCx, >70% pLAD; b.) LHC/PCI 11/15/2020: 90% pLAD (2.75 x 18 mm Resolute Onyx DES); c.) MV 06/26/2023: no sig ischemia   Cervical spondylosis    Colitis    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)    a.) s/p multiple  vascular interventions   Diastolic dysfunction 06/26/2023   a.) TTE 06/26/2023: EF 60-65%, no RWMAs, G2DD, sev AoV thickening with mild AR, mild-mod AS (MPG 18.2 mmHg; AVA 0.73 cm2)   [place]   Dyslipidemia    History of cervical cancer    a.) s/p TAH   HPV (human papilloma virus) infection    Hyperlipidemia    Hypothyroidism    IGT (impaired glucose tolerance)    Left upper lobe pulmonary nodule    Long term current use of aspirin     Long term current use of ticagrelor  therapy    Macrocytic anemia    Osteoporosis    a.) Tx'd with zolodronic acid (Reclast) infusions   PAD (peripheral artery disease)    Polymyalgia rheumatica    Skin cancer of nose    Transaminitis    Vaginal dysplasia     Past Surgical History:   Procedure Laterality Date   COLONOSCOPY WITH PROPOFOL  N/A 10/04/2020   Procedure: COLONOSCOPY WITH PROPOFOL ;  Surgeon: Toledo, Ladell POUR, MD;  Location: [facility];  Service: Gastroenterology;  Laterality: N/A;   COLPOSCOPY     CORONARY STENT INTERVENTION N/A 11/15/2020   Procedure: CORONARY STENT INTERVENTION;  Surgeon: Florencio Cara BIRCH, MD;  Location: [facility];  Service: Cardiovascular;  Laterality: N/A;   FEMORAL-POPLITEAL BYPASS GRAFT Left 07/16/2023   Procedure: BYPASS GRAFT FEMORAL-POPLITEAL ARTERY (FEMORAL-POSTERIOR TIBIAL BYPASS W/ SAPHENOUS VEIN- [id number]);  Surgeon: Jama Cordella MATSU, MD;  Location: [facility];  Service: Vascular;  Laterality: Left;   KNEE ARTHROSCOPY Left    LAPAROSCOPIC HYSTERECTOMY     LEFT HEART CATH AND CORONARY ANGIOGRAPHY N/A 11/15/2020   Procedure: LEFT HEART CATH AND CORONARY ANGIOGRAPHY;  Surgeon: Florencio Cara BIRCH, MD;  Location: [facility];  Service: Cardiovascular;  Laterality: N/A;   LOWER EXTREMITY ANGIOGRAPHY Left 09/05/2020   Procedure: LOWER EXTREMITY ANGIOGRAPHY;  Surgeon: Jama Cordella MATSU, MD;  Location: [facility];  Service: Cardiovascular;  Laterality: Left;   LOWER EXTREMITY ANGIOGRAPHY Left 04/10/2021   Procedure: LOWER EXTREMITY ANGIOGRAPHY;  Surgeon: Jama Cordella MATSU, MD;  Location: [facility];  Service: Cardiovascular;  Laterality: Left;   LOWER EXTREMITY ANGIOGRAPHY Left 11/20/2021   Procedure: Lower Extremity Angiography;  Surgeon: Jama Cordella MATSU, MD;  Location: [facility];  Service: Cardiovascular;  Laterality: Left;   LOWER EXTREMITY ANGIOGRAPHY Left 06/05/2022   Procedure: Lower Extremity Angiography;  Surgeon: Jama Cordella MATSU, MD;  Location: [facility];  Service: Cardiovascular;  Laterality: Left;   LOWER EXTREMITY ANGIOGRAPHY Left 07/23/2022   Procedure: Lower Extremity Angiography;  Surgeon: Jama Cordella MATSU, MD;  Location: [facility];  Service:  Cardiovascular;  Laterality: Left;   LOWER EXTREMITY ANGIOGRAPHY Left 12/27/2022   Procedure: Lower Extremity Angiography;  Surgeon: Jama Cordella MATSU, MD;  Location: [facility];  Service: Cardiovascular;  Laterality: Left;   LOWER EXTREMITY ANGIOGRAPHY Left 06/17/2023   Procedure: Lower Extremity Angiography;  Surgeon: Jama Cordella MATSU, MD;  Location: [facility];  Service: Cardiovascular;  Laterality: Left;   LOWER EXTREMITY ANGIOGRAPHY Left 01/20/2024   Procedure: Lower Extremity Angiography;  Surgeon: Jama Cordella MATSU, MD;  Location: [facility];  Service: Cardiovascular;  Laterality: Left;   LOWER EXTREMITY ANGIOGRAPHY Left 01/21/2024   Procedure: Lower Extremity Angiography;  Surgeon: Jama Cordella MATSU, MD;  Location: [facility];  Service: Cardiovascular;  Laterality: Left;  LOWER EXTREMITY INTERVENTION Left 01/20/2024   Procedure: LOWER EXTREMITY INTERVENTION;  Surgeon: Jama Cordella MATSU, MD;  Location: [facility];  Service: Cardiovascular;  Laterality: Left;   TOTAL ABDOMINAL HYSTERECTOMY      Social History Social History   Tobacco Use   Smoking status: Never   Smokeless tobacco: Never  Vaping Use   Vaping status: Never Used  Substance Use Topics   Alcohol  use: Yes    Alcohol /week: 1.0 standard drink of alcohol     Types: 1 Glasses of wine per week    Comment: 5   Drug use: Never    Family History Family History  Problem Relation Age of Onset   Cancer Mother    Pancreatic cancer Mother    Heart failure Mother    Heart attack Father 20   Heart disease Father     Allergies  Allergen Reactions   Lactose Other (See Comments) and Nausea And Vomiting    Gi upset and diarrhea  Other reaction(s): Other (See Comments)  Gi upset and diarrhea  Other reaction(s): Other (See Comments) Gi upset and diarrhea Gi upset and diarrhea Gi upset and diarrhea    Gi upset and diarrhea    Gi upset and diarrhea Gi upset and  diarrhea Gi upset and diarrhea  Gi upset and diarrhea    Gi upset and diarrhea  Other reaction(s): Other (See Comments) Gi upset and diarrhea Gi upset and diarrhea Gi upset and diarrhea  Gi upset and diarrhea  Gi upset and diarrhea Gi upset and diarrhea Gi upset and diarrhea     REVIEW OF SYSTEMS (Negative unless checked)  Constitutional: [] Weight loss  [] Fever  [] Chills Cardiac: [] Chest pain   [] Chest pressure   [] Palpitations   [] Shortness of breath when laying flat   [] Shortness of breath with exertion. Vascular:  [x] Pain in legs with walking   [] Pain in legs at rest  [] History of DVT   [] Phlebitis   [] Swelling in legs   [] Varicose veins   [] Non-healing ulcers Pulmonary:   [] Uses home oxygen   [] Productive cough   [] Hemoptysis   [] Wheeze  [] COPD   [] Asthma Neurologic:  [] Dizziness   [] Seizures   [] History of stroke   [] History of TIA  [] Aphasia   [] Vissual changes   [] Weakness or numbness in arm   [] Weakness or numbness in leg Musculoskeletal:   [] Joint swelling   [] Joint pain   [] Low back pain Hematologic:  [] Easy bruising  [] Easy bleeding   [] Hypercoagulable state   [] Anemic Gastrointestinal:  [] Diarrhea   [] Vomiting  [] Gastroesophageal reflux/heartburn   [] Difficulty swallowing. Genitourinary:  [] Chronic kidney disease   [] Difficult urination  [] Frequent urination   [] Blood in urine Skin:  [] Rashes   [] Ulcers  Psychological:  [] History of anxiety   []  History of major depression.  Physical Examination  Vitals:   08/09/24 1154  BP: (!) 174/77  Pulse: 60  Resp: 16  Weight: 122 lb (55.3 kg)   Body mass index is 18.55 kg/m. Gen: WD/WN, NAD Head: [place]/AT, No temporalis wasting.  Ear/Nose/Throat: Hearing grossly intact, nares w/o erythema or drainage Eyes: PER, EOMI, sclera nonicteric.  Neck: Supple, no masses.  No bruit or JVD.  Pulmonary:  Good air movement, no audible wheezing, no use of accessory muscles.  Cardiac: RRR, normal S1, S2, no Murmurs. Vascular:  mild trophic  changes, no open wounds Vessel Right Left  Radial Palpable Palpable  PT Palpable Not Palpable  DP  Palpable Not Palpable  Gastrointestinal: soft, non-distended.  No guarding/no peritoneal signs.  Musculoskeletal: M/S 5/5 throughout.  No visible deformity.  Neurologic: CN 2-12 intact. Pain and light touch intact in extremities.  Symmetrical.  Speech is fluent. Motor exam as listed above. Psychiatric: Judgment intact, Mood & affect appropriate for pt's clinical situation. Dermatologic: No rashes or ulcers noted.  No changes consistent with cellulitis.   CBC Lab Results  Component Value Date   WBC 9.1 02/02/2024   HGB 9.1 (L) 02/02/2024   HCT 27.7 (L) 02/02/2024   MCV 92.0 02/02/2024   PLT 370 02/02/2024    BMET    Component Value Date/Time   NA 134 (L) 02/02/2024 0530   K 3.9 02/02/2024 0530   CL 104 02/02/2024 0530   CO2 21 (L) 02/02/2024 0530   GLUCOSE 93 02/02/2024 0530   BUN 15 02/02/2024 0530   CREATININE 0.65 02/02/2024 0530   CALCIUM  8.5 (L) 02/02/2024 0530   GFRNONAA >60 02/02/2024 0530   CrCl cannot be calculated (Patient's most recent lab result is older than the maximum 21 days allowed.).  COAG No results found for: INR, PROTIME  Radiology No results found.   Assessment/Plan 1. Atherosclerosis of artery of extremity with rest pain (HCC) (Primary) Recommend:  The patient has evidence of severe atherosclerotic changes of both lower extremities with rest pain that is associated with preulcerative changes and impending tissue loss of the left foot.  This represents a limb threatening ischemia and places the patient at the risk for limb left limb loss.  Patient should undergo angiography of the left lower extremity with the hope for intervention for limb salvage.  The risks and benefits as well as the alternative therapies was discussed in detail with the patient.  All questions were answered.  Patient agrees to proceed with left lower extremity  angiography.  The patient will follow up with me in the office after the procedure.   2. Critical lower limb ischemia (HCC) Recommend:  The patient has evidence of severe atherosclerotic changes of both lower extremities with rest pain that is associated with preulcerative changes and impending tissue loss of the left foot.  This represents a limb threatening ischemia and places the patient at the risk for limb left limb loss.  Patient should undergo angiography of the left lower extremity with the hope for intervention for limb salvage.  The risks and benefits as well as the alternative therapies was discussed in detail with the patient.  All questions were answered.  Patient agrees to proceed with left lower extremity angiography.  The patient will follow up with me in the office after the procedure.   3. Coronary artery disease of native artery of native heart with stable angina pectoris Continue cardiac and antihypertensive medications as already ordered and reviewed, no changes at this time.  Continue statin as ordered and reviewed, no changes at this time  Nitrates PRN for chest pain  4. Paroxysmal atrial fibrillation (HCC) Continue antiarrhythmia medications as already ordered, these medications have been reviewed and there are no changes at this time.  Continue anticoagulation as ordered by Cardiology Service  5. Mixed hyperlipidemia Continue statin as ordered and reviewed, no changes at this time d   Cordella Shawl, MD  08/09/2024 12:54 PM

## 2024-08-10 NOTE — Telephone Encounter (Signed)
 I  have left a voice message for the pt to call  and Echo that came available on Nov 13.

## 2024-08-11 ENCOUNTER — Encounter: Payer: Self-pay | Admitting: Vascular Surgery

## 2024-08-11 ENCOUNTER — Other Ambulatory Visit: Payer: Self-pay

## 2024-08-11 ENCOUNTER — Encounter: Admission: RE | Disposition: A | Payer: Self-pay | Attending: Vascular Surgery

## 2024-08-11 ENCOUNTER — Observation Stay
Admission: RE | Admit: 2024-08-11 | Discharge: 2024-08-12 | Disposition: A | Discharge status: Home / Self Care | Attending: Vascular Surgery | Admitting: Vascular Surgery

## 2024-08-11 DIAGNOSIS — Z79899 Other long term (current) drug therapy: Secondary | ICD-10-CM | POA: Diagnosis not present

## 2024-08-11 DIAGNOSIS — I70229 Atherosclerosis of native arteries of extremities with rest pain, unspecified extremity: Principal | ICD-10-CM | POA: Diagnosis present

## 2024-08-11 DIAGNOSIS — Y832 Surgical operation with anastomosis, bypass or graft as the cause of abnormal reaction of the patient, or of later complication, without mention of misadventure at the time of the procedure: Secondary | ICD-10-CM | POA: Insufficient documentation

## 2024-08-11 DIAGNOSIS — I743 Embolism and thrombosis of arteries of the lower extremities: Secondary | ICD-10-CM | POA: Diagnosis not present

## 2024-08-11 DIAGNOSIS — T82868A Thrombosis of vascular prosthetic devices, implants and grafts, initial encounter: Principal | ICD-10-CM

## 2024-08-11 DIAGNOSIS — E782 Mixed hyperlipidemia: Secondary | ICD-10-CM | POA: Diagnosis not present

## 2024-08-11 DIAGNOSIS — I770 Arteriovenous fistula, acquired: Secondary | ICD-10-CM | POA: Diagnosis not present

## 2024-08-11 DIAGNOSIS — T82856A Stenosis of peripheral vascular stent, initial encounter: Secondary | ICD-10-CM | POA: Diagnosis not present

## 2024-08-11 DIAGNOSIS — I70223 Atherosclerosis of native arteries of extremities with rest pain, bilateral legs: Secondary | ICD-10-CM | POA: Insufficient documentation

## 2024-08-11 DIAGNOSIS — I25118 Atherosclerotic heart disease of native coronary artery with other forms of angina pectoris: Secondary | ICD-10-CM | POA: Diagnosis not present

## 2024-08-11 DIAGNOSIS — I480 Paroxysmal atrial fibrillation: Secondary | ICD-10-CM | POA: Insufficient documentation

## 2024-08-11 DIAGNOSIS — Z7902 Long term (current) use of antithrombotics/antiplatelets: Secondary | ICD-10-CM | POA: Diagnosis not present

## 2024-08-11 DIAGNOSIS — Z7901 Long term (current) use of anticoagulants: Secondary | ICD-10-CM | POA: Insufficient documentation

## 2024-08-11 DIAGNOSIS — I70222 Atherosclerosis of native arteries of extremities with rest pain, left leg: Secondary | ICD-10-CM | POA: Diagnosis not present

## 2024-08-11 HISTORY — PX: LOWER EXTREMITY ANGIOGRAPHY: CATH118251

## 2024-08-11 LAB — BUN: BUN: 18 mg/dL (ref 8–23)

## 2024-08-11 LAB — CREATININE, SERUM
Creatinine, Ser: 0.79 mg/dL (ref 0.44–1.00)
GFR, Estimated: >60 mL/min (ref >60)

## 2024-08-11 SURGERY — LOWER EXTREMITY ANGIOGRAPHY
Anesthesia: Moderate Sedation | Site: Leg Lower | Laterality: Left

## 2024-08-11 MED ORDER — HYDRALAZINE HCL 20 MG/ML IJ SOLN: 5.0000 mg | INTRAMUSCULAR | Status: DC | PRN

## 2024-08-11 MED ORDER — ONDANSETRON HCL 4 MG/2ML IJ SOLN: 4.0000 mg | Freq: Four times a day (QID) | INTRAMUSCULAR | Status: DC | PRN

## 2024-08-11 MED ORDER — LIDOCAINE-EPINEPHRINE (PF) 1 %-1:200000 IJ SOLN
INTRAMUSCULAR | Status: DC | PRN
  Administered 2024-08-11: 10 mL via INTRADERMAL

## 2024-08-11 MED ORDER — NITROGLYCERIN 1 MG/10 ML FOR IR/CATH LAB
INTRA_ARTERIAL | Status: DC | PRN
  Administered 2024-08-11: 200 ug
  Administered 2024-08-11: 400 ug

## 2024-08-11 MED ORDER — FENTANYL CITRATE (PF) 100 MCG/2ML IJ SOLN
INTRAMUSCULAR | Status: DC | PRN
  Administered 2024-08-11 (×5): 25 ug via INTRAVENOUS
  Administered 2024-08-11: 50 ug via INTRAVENOUS

## 2024-08-11 MED ORDER — SODIUM CHLORIDE 0.9 % IV SOLN
250.0000 mL | INTRAVENOUS | Status: DC | PRN
Start: 2024-08-11 — End: 2024-08-12

## 2024-08-11 MED ORDER — SODIUM CHLORIDE 0.9% FLUSH
3.0000 mL | INTRAVENOUS | Status: DC | PRN
Start: 2024-08-11 — End: 2024-08-12

## 2024-08-11 MED ORDER — FENTANYL CITRATE (PF) 100 MCG/2ML IJ SOLN
INTRAMUSCULAR | Status: AC
  Filled 2024-08-11: qty 2

## 2024-08-11 MED ORDER — FAMOTIDINE 20 MG PO TABS: 40.0000 mg | ORAL_TABLET | Freq: Once | ORAL | Status: DC | PRN

## 2024-08-11 MED ORDER — DIPHENHYDRAMINE HCL 50 MG/ML IJ SOLN: 50.0000 mg | Freq: Once | INTRAMUSCULAR | Status: DC | PRN

## 2024-08-11 MED ORDER — OXYCODONE HCL 5 MG PO TABS
5.0000 mg | ORAL_TABLET | ORAL | Status: DC | PRN
  Administered 2024-08-12 (×3): 5 mg via ORAL
  Filled 2024-08-11 (×3): qty 1

## 2024-08-11 MED ORDER — MORPHINE SULFATE (PF) 2 MG/ML IV SOLN: 2.0000 mg | INTRAVENOUS | Status: DC | PRN

## 2024-08-11 MED ORDER — IODIXANOL 320 MG/ML IV SOLN
INTRAVENOUS | Status: DC | PRN
  Administered 2024-08-11: 105 mL

## 2024-08-11 MED ORDER — TIROFIBAN HCL IV 12.5 MG/250 ML
0.0750 ug/kg/min | INTRAVENOUS | Status: AC
  Administered 2024-08-11 – 2024-08-12 (×2): 0.075 ug/kg/min via INTRAVENOUS
  Filled 2024-08-11 (×2): qty 250

## 2024-08-11 MED ORDER — B COMPLEX-C PO TABS
1.0000 | ORAL_TABLET | Freq: Every day | ORAL | Status: DC
  Filled 2024-08-11: qty 1

## 2024-08-11 MED ORDER — APIXABAN 5 MG PO TABS
5.0000 mg | ORAL_TABLET | Freq: Two times a day (BID) | ORAL | Status: DC
  Administered 2024-08-12: 5 mg via ORAL
  Filled 2024-08-11 (×2): qty 1

## 2024-08-11 MED ORDER — VITAMIN C 500 MG PO TABS
2000.0000 mg | ORAL_TABLET | Freq: Every day | ORAL | Status: DC
  Filled 2024-08-11: qty 6

## 2024-08-11 MED ORDER — TICAGRELOR 90 MG PO TABS
90.0000 mg | ORAL_TABLET | Freq: Two times a day (BID) | ORAL | Status: DC
  Administered 2024-08-12: 90 mg via ORAL
  Filled 2024-08-11: qty 1

## 2024-08-11 MED ORDER — METHYLPREDNISOLONE SODIUM SUCC 125 MG IJ SOLR: 125.0000 mg | Freq: Once | INTRAMUSCULAR | Status: DC | PRN

## 2024-08-11 MED ORDER — HEPARIN (PORCINE) IN NACL 1000-0.9 UT/500ML-% IV SOLN
INTRAVENOUS | Status: AC
  Filled 2024-08-11: qty 500

## 2024-08-11 MED ORDER — CEFAZOLIN SODIUM-DEXTROSE 2-4 GM/100ML-% IV SOLN
2.0000 g | INTRAVENOUS | Status: AC
  Administered 2024-08-11: 2 g via INTRAVENOUS

## 2024-08-11 MED ORDER — ACETAMINOPHEN 325 MG PO TABS: 650.0000 mg | ORAL_TABLET | ORAL | Status: DC | PRN

## 2024-08-11 MED ORDER — NITROGLYCERIN 1 MG/10 ML FOR IR/CATH LAB
INTRA_ARTERIAL | Status: AC
  Filled 2024-08-11: qty 10

## 2024-08-11 MED ORDER — SODIUM CHLORIDE 0.9% FLUSH
3.0000 mL | Freq: Two times a day (BID) | INTRAVENOUS | Status: DC
  Administered 2024-08-11 – 2024-08-12 (×2): 3 mL via INTRAVENOUS

## 2024-08-11 MED ORDER — ESTRADIOL 0.01 % VA CREA
1.0000 | TOPICAL_CREAM | VAGINAL | Status: DC
  Filled 2024-08-11: qty 42.5

## 2024-08-11 MED ORDER — AMIODARONE HCL 200 MG PO TABS
200.0000 mg | ORAL_TABLET | Freq: Every day | ORAL | Status: DC
  Administered 2024-08-12: 200 mg via ORAL
  Filled 2024-08-11: qty 1

## 2024-08-11 MED ORDER — HEPARIN (PORCINE) IN NACL 1000-0.9 UT/500ML-% IV SOLN
INTRAVENOUS | Status: DC | PRN
  Administered 2024-08-11: 500 mL
  Administered 2024-08-11: 1000 mL

## 2024-08-11 MED ORDER — LABETALOL HCL 5 MG/ML IV SOLN: 10.0000 mg | INTRAVENOUS | Status: DC | PRN

## 2024-08-11 MED ORDER — MIDAZOLAM HCL 2 MG/ML PO SYRP: 8.0000 mg | ORAL_SOLUTION | Freq: Once | ORAL | Status: DC | PRN

## 2024-08-11 MED ORDER — TIROFIBAN HCL IN NACL 5-0.9 MG/100ML-% IV SOLN
INTRAVENOUS | Status: AC
  Administered 2024-08-11: 1382.5 ug via INTRAVENOUS
  Filled 2024-08-11: qty 100

## 2024-08-11 MED ORDER — HEPARIN SODIUM (PORCINE) 1000 UNIT/ML IJ SOLN
INTRAMUSCULAR | Status: DC | PRN
  Administered 2024-08-11: 1000 [IU] via INTRAVENOUS
  Administered 2024-08-11: 6000 [IU] via INTRAVENOUS
  Administered 2024-08-11: 1000 [IU] via INTRAVENOUS

## 2024-08-11 MED ORDER — HEPARIN SODIUM (PORCINE) 1000 UNIT/ML IJ SOLN
INTRAMUSCULAR | Status: AC
  Filled 2024-08-11: qty 10

## 2024-08-11 MED ORDER — DIPHENOXYLATE-ATROPINE 2.5-0.025 MG PO TABS: 1.0000 | ORAL_TABLET | Freq: Four times a day (QID) | ORAL | Status: DC | PRN

## 2024-08-11 MED ORDER — FENTANYL CITRATE (PF) 50 MCG/ML IJ SOSY
PREFILLED_SYRINGE | INTRAMUSCULAR | Status: AC
  Filled 2024-08-11: qty 1

## 2024-08-11 MED ORDER — MIDAZOLAM HCL 5 MG/5ML IJ SOLN
INTRAMUSCULAR | Status: AC
  Filled 2024-08-11: qty 5

## 2024-08-11 MED ORDER — CEFAZOLIN SODIUM-DEXTROSE 2-4 GM/100ML-% IV SOLN
INTRAVENOUS | Status: AC
Start: 2024-08-11 — End: 2024-08-11
  Filled 2024-08-11: qty 100

## 2024-08-11 MED ORDER — MIDAZOLAM HCL (PF) 2 MG/2ML IJ SOLN
INTRAMUSCULAR | Status: DC | PRN
  Administered 2024-08-11: 2 mg via INTRAVENOUS
  Administered 2024-08-11 (×2): 1 mg via INTRAVENOUS
  Administered 2024-08-11: .5 mg via INTRAVENOUS
  Administered 2024-08-11 (×2): 1 mg via INTRAVENOUS

## 2024-08-11 MED ORDER — LOSARTAN POTASSIUM 25 MG PO TABS
25.0000 mg | ORAL_TABLET | Freq: Every day | ORAL | Status: DC
  Administered 2024-08-12: 25 mg via ORAL
  Filled 2024-08-11: qty 1

## 2024-08-11 MED ORDER — HYDROMORPHONE HCL 1 MG/ML IJ SOLN: 1.0000 mg | Freq: Once | INTRAMUSCULAR | Status: DC | PRN

## 2024-08-11 MED ORDER — TIROFIBAN (AGGRASTAT) BOLUS VIA INFUSION
25.0000 ug/kg | Freq: Once | INTRAVENOUS | Status: AC
  Filled 2024-08-11: qty 28

## 2024-08-11 MED ORDER — VITAMIN D 25 MCG (1000 UNIT) PO TABS: 5000.0000 [IU] | ORAL_TABLET | Freq: Every day | ORAL | Status: DC

## 2024-08-11 MED ORDER — ROSUVASTATIN CALCIUM 5 MG PO TABS: 5.0000 mg | ORAL_TABLET | Freq: Every evening | ORAL | Status: DC

## 2024-08-11 MED ORDER — BUDESONIDE 3 MG PO CPEP
9.0000 mg | ORAL_CAPSULE | Freq: Every day | ORAL | Status: DC
  Filled 2024-08-11: qty 3

## 2024-08-11 MED ORDER — LEVOTHYROXINE SODIUM 75 MCG PO TABS
75.0000 ug | ORAL_TABLET | Freq: Every day | ORAL | Status: DC
  Administered 2024-08-12: 75 ug via ORAL
  Filled 2024-08-11: qty 3
  Filled 2024-08-11: qty 1

## 2024-08-11 MED ORDER — SODIUM CHLORIDE 0.9 % IV SOLN: INTRAVENOUS | Status: AC

## 2024-08-11 MED ORDER — MIDAZOLAM HCL 2 MG/2ML IJ SOLN
INTRAMUSCULAR | Status: AC
  Filled 2024-08-11: qty 2

## 2024-08-11 MED ORDER — SODIUM CHLORIDE 0.9 % IV SOLN: INTRAVENOUS | Status: DC

## 2024-08-11 SURGICAL SUPPLY — 37 items
BALLOON DORADO 7X60X80 (BALLOONS) IMPLANT
BALLOON LUTONIX 018 4X60X130 (BALLOONS) IMPLANT
BALLOON LUTONIX 018 5X60X130 (BALLOONS) IMPLANT
BALLOON LUTONIX 018 6X300X130 (BALLOONS) IMPLANT
BALLOON ULTRVRSE 2.5X220X150 (BALLOONS) IMPLANT
BALLOON ULTRVRSE 6X150X130 (BALLOONS) IMPLANT
CANISTER PENUMBRA ENGINE (MISCELLANEOUS) IMPLANT
CATH ANGIO 5F PIGTAIL 65CM (CATHETERS) IMPLANT
CATH BEACON 5 .035 65 KMP TIP (CATHETERS) IMPLANT
CATH BEACON TIP VERT 5FR 125 (CATHETERS) IMPLANT
CATH INFUS 135X50 (CATHETERS) IMPLANT
CATH LIGHTNING BOLT 6 XTX (CATHETERS) IMPLANT
CATH SEEKER .035X135CM (CATHETERS) IMPLANT
COIL POD SYSTEM XL 8X55 (Miscellaneous) IMPLANT
COIL POD SYSTEM XL 8X70 (Miscellaneous) IMPLANT
COVER PROBE ULTRASOUND 5X96 (MISCELLANEOUS) IMPLANT
DEVICE PRESTO INFLATION (MISCELLANEOUS) IMPLANT
DEVICE STARCLOSE SE CLOSURE (Vascular Products) IMPLANT
DEVICE TORQUE (MISCELLANEOUS) IMPLANT
GLIDEWIRE ADV .035X260CM (WIRE) IMPLANT
GOWN STRL REUS W/ TWL LRG LVL3 (GOWN DISPOSABLE) ×1 IMPLANT
HANDLE DETACHMENT COIL (MISCELLANEOUS) IMPLANT
KIT MICROPUNCTURE VSI 5F STIFF (SHEATH) IMPLANT
NDL ENTRY 21GA 7CM ECHOTIP (NEEDLE) IMPLANT
NEEDLE ENTRY 21GA 7CM ECHOTIP (NEEDLE) ×1 IMPLANT
PACK ANGIOGRAPHY (CUSTOM PROCEDURE TRAY) ×1 IMPLANT
SET INTRO CAPELLA COAXIAL (SET/KITS/TRAYS/PACK) IMPLANT
SHEATH ANL2 6FRX45 HC (SHEATH) IMPLANT
SHEATH BRITE TIP 5FRX11 (SHEATH) IMPLANT
STENT LIFESTENT 5F 6X60X135 (Permanent Stent) IMPLANT
STENT VIABAHN 6X150X120 (Permanent Stent) IMPLANT
STENT VIABAHN 6X50X120 (Permanent Stent) IMPLANT
SYR MEDRAD MARK 7 150ML (SYRINGE) IMPLANT
TUBING CONTRAST HIGH PRESS 72 (TUBING) IMPLANT
WIRE COMMAND ST STR 014 300 (WIRE) IMPLANT
WIRE J 3MM .035X145CM (WIRE) IMPLANT
WIRE SUPRACORE 300CM (WIRE) IMPLANT

## 2024-08-11 NOTE — Op Note (Signed)
 [facility]  Percutaneous Study/Intervention Procedural Note   Date of Surgery: 08/11/2024  Surgeon:  Melanie Shawl  Pre-operative Diagnosis:  1.  Atherosclerotic occlusive disease bilateral lower extremities with left lower extremity with rest pain. 2.  Complication of previously placed vascular stents with thrombosis   Post-operative diagnosis:  Same  Procedure(s) Performed:             1.  Introduction catheter into left lower extremity 3rd order catheter placement               2.    Contrast injection left lower extremity for distal runoff             3.  Percutaneous transluminal angioplasty and stent placement left superficial femoral artery to 6 mm             4.  Percutaneous transluminal angioplasty and stent placement left tibioperoneal trunk and proximal posterior tibial artery             5.  Infusion of 14 mg of tPA into the left SFA and posterior tibial artery.                6.  Mechanical thrombectomy using the CAT 6 bolt catheter left SFA and posterior tibial.                 7. Coil embolization of the remnant of the great saphenous vein secondary to AV fistula formation                8.  Star close closure right common femoral arteriotomy  Anesthesia: Conscious sedation was administered under my direct supervision by the interventional radiology RN. IV Versed  plus fentanyl  were utilized. Continuous ECG, pulse oximetry and blood pressure was monitored throughout the entire procedure.  Conscious sedation was for a total of 3 hours 12 minutes and 36 seconds.  Sheath: 6 French Ansell right common femoral retrograde  Contrast: 105 cc  Fluoroscopy Time: 32.3 minutes  Indications:  Melanie Browning presents with increasing pain of the left lower extremity.  She was seen in the office where duplex ultrasound demonstrated she has once again thrombosed her SFA stents and proximal posterior tibial artery.  This suggests the patient is having limb  threatening ischemia.  Angiography with the hope of intervention for limb salvage is recommended.  The risks and benefits are reviewed all questions answered patient agrees to proceed.  Procedure:   Melanie Browning is a 83 y.o. y.o. female who was identified and appropriate procedural time out was performed.  The patient was then placed supine on the table and prepped and draped in the usual sterile fashion.    Ultrasound was placed in the sterile sleeve and the right groin was evaluated the right common femoral artery was echolucent and pulsatile indicating patency.  Image was recorded for the permanent record and under real-time visualization a microneedle was inserted into the common femoral artery followed by the microwire and then the micro-sheath.  A J-wire was then advanced through the micro-sheath and a  5 French sheath was then inserted over a J-wire. J-wire was then advanced and a 5 French pigtail catheter was positioned at the level of T12.  AP projection of the aorta was then obtained. Pigtail catheter was repositioned to above the bifurcation and a RAO view of the pelvis was obtained.  Subsequently a pigtail catheter with an Advantage wire was used to cross the aortic bifurcation.  The catheter  and wire were advanced down into the left distal external iliac artery. Oblique view of the femoral bifurcation was then obtained and subsequently the wire was reintroduced and the pigtail catheter negotiated into the SFA representing third order catheter placement. Distal runoff was then performed.  6000 units of heparin  was then given and allowed to circulate for several minutes (during the case an additional two 1000 unit boluses were administered).  A 6 French Ansell sheath was advanced up and over the bifurcation and positioned in the distal common femoral artery  KMP catheter and advantage Glidewire were then negotiated into the existing stents within the SFA and then down into the distal popliteal.  Catheter was then advanced. Hand injection contrast demonstrated the thrombus in the posterior tibial artery proximally.  Distally the posterior tibial artery was free of thrombus filling the plantar arteries send the pedal arch.   At this point having confirmed there is a distal target from the mid posterior tibial distally and infusion catheter with a 50 cm infusion length was advanced over the wire the obturator was placed and 14 mg of tPA was laced into the clot from the posterior tibial artery proximally.  This was then allowed to dwell for approximately 40 minutes.  Hand-injection contrast confirmed that there was now flow through the SFA however a large thrombus burden still existed and 0.014 command wire was advanced through the infusion catheter and positioned in the distal posterior tibial artery.  The penumbra CAT 6 bolt catheter was then prepped on the field and advanced over the 014 wire and mechanical thrombectomy was performed throughout the entire length of the SFA as well as the proximal half of the posterior tibial and tibioperoneal trunk.  Once several passes have been made follow-up imaging demonstrated there were multiple greater then 70% stenoses in the proximal 200 mm of the SFA stents.  A 6 mm x 300 mm Lutonix balloon was then used to angioplasty the SFA and popliteal in its entirety.  2 separate inflations were required.  Inflations were to 8 to 12 atm for 1 minute.  Follow-up imaging demonstrated minimal improvement in these lesions proximally, these were still flow-limiting, from the distal one third of the SFA through the popliteal now appeared to be well treated and free of hemodynamically significant stenosis distally the tibioperoneal trunk was a subtotal occlusion extending into the posterior tibial artery for several centimeters.  Beyond this there was a diffuse 60 to 70% stenosis in the proximal half of the posterior tibial artery.  At this point I did give 300 mcg of  nitroglycerin  intra-arterially.  The later in the case a 2 additional 200 mcg aliquots were also given.  Given the high-grade residual stenosis proximally in the SFA, I placed a 6 mm x 150 mm Viabahn with the leading edge just at the level of the previously placed stent at the origin of the SFA and then extended this Viabahn with a 6 mm x 50 mm stent overlapping slightly.  The Viabahn's were then postdilated with a 6 mm x 150 mm balloon inflated to 10 to 12 atm.  The most distal lesion did not fully expand and therefore a 7 mm x 60 mm Dorado balloon was advanced over the wire and inflated to 24 atm.  Follow-up imaging now demonstrated less than 10% residual stenosis throughout the SFA and popliteal arteries.  The detector was then repositioned to treat the tibioperoneal trunk    A 4 mm x 60 mm Lutonix drug-eluting  balloon was used to angioplasty the tibioperoneal trunk and proximal posterior tibial.  The inflation was for 1 minute at 12 atm. Follow-up imaging demonstrated patency with greater than 60% residual stenosis primarily in the tibioperoneal trunk.  I selected a 6 mm x 60 mm life stent and deployed this across the tibioperoneal trunk and postdilated this with a 5 mm x 60 mm Lutonix drug-eluting balloon inflated to 8 atm.  Follow-up imaging demonstrated less than 10% residual stenosis.  However in spite of nitroglycerin  the diffuse lesion in the more distal posterior tibial was still present.  A 2.5 mm x 220 mm balloon was then advanced across this lesion inflated to 10 atm for 1 minute.  Follow-up imaging now demonstrated wide patency of the posterior tibial artery throughout its entire length with less than 10% residual stenosis.  Distally the posterior tibial remains widely patent and fills the plantar arteries and the pedal arch.  The detector was then positioned over the groin and using a Kumpe catheter I negotiated the catheter and wire into the stump of the great saphenous vein which had been  previously used for bypass.  However it has maintained its patency over the first 25 to 30 cm and is now filling the deep venous system extensively through large tributaries.  This is clearly competitive flow and counterproductive to her distal perfusion.  Once the Kumpe catheter was in place I deployed 3 Ruby coils a 8 x 70 cm coil followed by two 8 x 55 cm coils.  Follow-up imaging demonstrated near occlusion of the saphenous vein with minimal flow into the deep venous system.  The Kumpe catheter was then removed.  After review of these images the sheath is pulled into the right external iliac oblique of the common femoral is obtained and a Star close device deployed. There no immediate Complications.  Findings:  The abdominal aorta is opacified with a bolus injection contrast. Renal arteries are single and widely patent without evidence of hemodynamically significant stenosis.  The aorta itself has diffuse disease but no hemodynamically significant lesions. The common and external iliac arteries are widely patent bilaterally.  The left common femoral is widely patent as is the profunda femoris.  The SFA is occluded at the leading edge of the previously placed stents and remains occluded through the entire SFA and popliteal portions extending the occlusion through the tibioperoneal trunk and into the proximal posterior tibial artery.  There is single-vessel posterior tibial artery runoff.  This is patent in its distal half and fills the plantar arteries and the pedal arch.  The great saphenous vein representing a previous bypass is still patent for a distance of approximately 25 to 30 cm with extensive tributaries filling the deep venous system and clearly diverting flow from the arterial system into the low resistance venous system which is counterproductive to distal perfusion similar to a steal phenomena.    Following tPA administration and then mechanical thrombectomy patency of the SFA popliteal and  posterior tibial artery is reestablished however significant residual stenosis is noted in the proximal portion of the SFA as well as in the tibioperoneal trunk and proximal half of the posterior tibial artery.  Angioplasty and stent placement as described above is performed in both of these arterial distributions with less than 10% residual stenosis and reestablishment of patency through the SFA and posterior tibial arteries to the foot.  Coil embolization is performed to eliminate the shunting of blood into the venous system and improve arterial perfusion  Summary: Successful recanalization left lower extremity for limb salvage                           Disposition: Patient was taken to the recovery room in stable condition having tolerated the procedure well.  Melanie Browning 08/11/2024,5:19 PM

## 2024-08-11 NOTE — Interval H&P Note (Signed)
 History and Physical Interval Note:  08/11/2024 1:11 PM  Melanie Browning  has presented today for surgery, with the diagnosis of LLE Angio w intervention   ASO w rest pain.  The various methods of treatment have been discussed with the patient and family. After consideration of risks, benefits and other options for treatment, the patient has consented to  Procedure(s): Lower Extremity Angiography (Left) as a surgical intervention.  The patient's history has been reviewed, patient examined, no change in status, stable for surgery.  I have reviewed the patient's chart and labs.  Questions were answered to the patient's satisfaction.     Cordella Shawl

## 2024-08-12 ENCOUNTER — Encounter: Payer: Self-pay | Admitting: Vascular Surgery

## 2024-08-12 DIAGNOSIS — I70222 Atherosclerosis of native arteries of extremities with rest pain, left leg: Secondary | ICD-10-CM | POA: Diagnosis not present

## 2024-08-12 DIAGNOSIS — Z95828 Presence of other vascular implants and grafts: Secondary | ICD-10-CM | POA: Diagnosis not present

## 2024-08-12 DIAGNOSIS — Z7901 Long term (current) use of anticoagulants: Secondary | ICD-10-CM

## 2024-08-12 DIAGNOSIS — Z9889 Other specified postprocedural states: Secondary | ICD-10-CM | POA: Diagnosis not present

## 2024-08-12 DIAGNOSIS — T82868A Thrombosis of vascular prosthetic devices, implants and grafts, initial encounter: Secondary | ICD-10-CM | POA: Diagnosis not present

## 2024-08-12 MED ORDER — ALTEPLASE 2 MG IJ SOLR
INTRAMUSCULAR | Status: DC | PRN
  Administered 2024-08-11: 14 mg

## 2024-08-12 NOTE — Discharge Summary (Signed)
 [facility]    Discharge Summary    Patient ID:  Melanie Browning MRN: [id number] DOB/AGE: 1941/08/20 83 y.o.  Admit date: 08/11/2024 Discharge date: 08/12/2024 Date of Surgery: 08/11/2024 Surgeon: Surgeon(s): Schnier, Cordella MATSU, MD  Admission Diagnosis: Atherosclerosis of artery of extremity with rest pain [facility]) [I70.229]  Discharge Diagnoses:  Atherosclerosis of artery of extremity with rest pain [facility]) [I70.229]  Secondary Diagnoses: Past Medical History:  Diagnosis Date   Aortic atherosclerosis    Aortic stenosis 06/26/2023   a.) TTE 06/26/2023: mild-mod AS (MPG 18.2 mmHg; AVA 0.73 cm2)   Arthritis    Atherosclerosis of native artery of left lower extremity with intermittent claudication    a.) multiple vascular interventions (PTA, stenting, arthrectomy, and thrombectomy) of LEFT SFA, LEFT popliteal, LEFT posterior tibial, and LEFT anterior tibial arteries due to critical limb ischemia   CAD (coronary artery disease) 11/09/2020   a.) cCTA 11/09/2020: Ca2+ 539 (83rd %'ile for age/sex/race match control); <25% dRCA & pLCx, >70% pLAD; b.) LHC/PCI 11/15/2020: 90% pLAD (2.75 x 18 mm Resolute Onyx DES); c.) MV 06/26/2023: no sig ischemia   Cervical spondylosis    Colitis    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)    a.) s/p multiple vascular interventions   Diastolic dysfunction 06/26/2023   a.) TTE 06/26/2023: EF 60-65%, no RWMAs, G2DD, sev AoV thickening with mild AR, mild-mod AS (MPG 18.2 mmHg; AVA 0.73 cm2)   [place]   Dyslipidemia    History of cervical cancer    a.) s/p TAH   HPV (human papilloma virus) infection    Hyperlipidemia    Hypothyroidism    IGT (impaired glucose tolerance)    Left upper lobe pulmonary nodule    Long term current use of aspirin     Long term current use of ticagrelor  therapy    Macrocytic anemia    Osteoporosis    a.) Tx'd with zolodronic acid (Reclast) infusions    PAD (peripheral artery disease)    Polymyalgia rheumatica    Skin cancer of nose    Transaminitis    Vaginal dysplasia     Procedure(s): Lower Extremity Angiography  Date of Surgery: 08/11/2024   Surgeon:  Cordella Shawl   Pre-operative Diagnosis:  1.  Atherosclerotic occlusive disease bilateral lower extremities with left lower extremity with rest pain. 2.  Complication of previously placed vascular stents with thrombosis    Post-operative diagnosis:  Same   Procedure(s) Performed:             1.  Introduction catheter into left lower extremity 3rd order catheter placement               2.    Contrast injection left lower extremity for distal runoff             3.  Percutaneous transluminal angioplasty and stent placement left superficial femoral artery to 6 mm             4.  Percutaneous transluminal angioplasty and stent placement left tibioperoneal trunk and proximal posterior tibial artery             5.  Infusion of 14 mg of tPA into the left SFA and posterior tibial artery.                6.  Mechanical thrombectomy using the CAT 6 bolt catheter left SFA and posterior tibial.  7. Coil embolization of the remnant of the great saphenous vein secondary to AV fistula formation                8.  Star close closure right common femoral arteriotomy  Discharged Condition: good  HPI:  Melanie Browning  has presented today for surgery, with the diagnosis of LLE Angio w intervention   ASO w rest pain.  The various methods of treatment have been discussed with the patient and family. After consideration of risks, benefits and other options for treatment, the patient has consented to Lower Extremity Angiography (Left) as a surgical intervention.  Hospital Course:  Melanie Browning is a 83 y.o. female is S/P Left Lower Extremity Angiography.  Patient is recovering as expected.  No complications to note overnight.  Patient was converted to oral Eliquis  this morning for  discharge today.  Patient's left lower extremity is warm to touch with palpable pulses.  Patient is eating well, urinary well and ambulating well.  Patient to be discharged today.  Patient is being discharged on Eliquis  5 mg twice daily, Brilinta  90 mg twice daily and Crestor  5 mg daily.  Patient was instructed not to skip or miss taking any of these medications as it will affect the outcome of her procedure.  Patient verbalizes her understanding.  I spent greater than 60 minutes in developing, implementing, teaching, and discharging this patient today.  Extubated: POD # 0 Physical Exam:  Alert notes x3, no acute distress Face: Symmetrical.  Tongue is midline. Neck: Trachea is midline.  No swelling or bruising. Cardiovascular: Regular rate and rhythm Pulmonary: Clear to auscultation bilaterally Abdomen: Soft, nontender, nondistended Right groin access: Clean dry and intact.  No swelling or drainage noted Left lower extremity: Thigh soft.  Calf soft.  Extremities warm distally toes.  Hard to palpate pedal pulses however the foot is warm is her good capillary refill. Right lower extremity: Thigh soft.  Calf soft.  Extremities warm distally toes.  Hard to palpate pedal pulses however the foot is warm is her good capillary refill. Neurological: No deficits noted   Post-op wounds:  clean, dry, intact or healing well  Pt. Ambulating, voiding and taking PO diet without difficulty. Pt pain controlled with PO pain meds.  Labs:  As below  Complications: none  Consults:    Significant Diagnostic Studies: CBC Lab Results  Component Value Date   WBC 9.1 02/02/2024   HGB 9.1 (L) 02/02/2024   HCT 27.7 (L) 02/02/2024   MCV 92.0 02/02/2024   PLT 370 02/02/2024    BMET    Component Value Date/Time   NA 134 (L) 02/02/2024 0530   K 3.9 02/02/2024 0530   CL 104 02/02/2024 0530   CO2 21 (L) 02/02/2024 0530   GLUCOSE 93 02/02/2024 0530   BUN 18 08/11/2024 1305   CREATININE 0.79  08/11/2024 1305   CALCIUM  8.5 (L) 02/02/2024 0530   GFRNONAA >60 08/11/2024 1305   COAG No results found for: INR, PROTIME   Disposition:  Discharge to :Home  Allergies as of 08/12/2024       Reactions   Lactose Other (See Comments), Nausea And Vomiting   Gi upset and diarrhea Other reaction(s): Other (See Comments) Gi upset and diarrhea  Other reaction(s): Other (See Comments) Gi upset and diarrhea Gi upset and diarrhea Gi upset and diarrhea    Gi upset and diarrhea    Gi upset and diarrhea Gi upset and diarrhea Gi upset and diarrhea Gi upset and  diarrhea    Gi upset and diarrhea  Other reaction(s): Other (See Comments) Gi upset and diarrhea Gi upset and diarrhea Gi upset and diarrhea  Gi upset and diarrhea  Gi upset and diarrhea Gi upset and diarrhea Gi upset and diarrhea        Medication List     TAKE these medications    amiodarone  200 MG tablet Commonly known as: PACERONE  Take 1 tablet (200 mg total) by mouth daily.   apixaban  5 MG Tabs tablet Commonly known as: Eliquis  Take 1 tablet (5 mg total) by mouth 2 (two) times daily.   ascorbic acid  1000 MG tablet Commonly known as: VITAMIN C Take 2,000-3,000 mg by mouth as needed.   Brilinta  90 MG Tabs tablet Generic drug: ticagrelor  TAKE 1 TABLET BY MOUTH TWICE DAILY   budesonide  3 MG 24 hr capsule Commonly known as: ENTOCORT EC  Take 3 capsules (9 mg total) by mouth every morning.   Cholecalciferol  125 MCG (5000 UT) capsule Take 5,000 Units by mouth.   diphenoxylate -atropine  2.5-0.025 MG tablet Commonly known as: LOMOTIL  TAKE 1 TABLET BY MOUTH 3 TIMES DAILY AS NEEDED FOR DIARRHEA.   estradiol  0.1 MG/GM vaginal cream Commonly known as: ESTRACE  Insert 1/4 applicator full into vagina at night twice a week   HYDROcodone -acetaminophen  10-325 MG tablet Commonly known as: NORCO Take 1 tablet by mouth as needed.   levothyroxine  75 MCG tablet Commonly known as: SYNTHROID  Take 75 mcg by mouth daily  before breakfast.   losartan  25 MG tablet Commonly known as: COZAAR  Take 25 mg by mouth daily.   losartan  25 MG tablet Commonly known as: COZAAR  Take 1 tablet by mouth daily.   rosuvastatin  5 MG tablet Commonly known as: CRESTOR  Take 1 tablet (5 mg total) by mouth daily.   Vitamin B-Complex Tabs Take 1 tablet by mouth daily.       Verbal and written Discharge instructions given to the patient. Wound care per Discharge AVS  Follow-up Information     Schnier, Cordella MATSU, MD Follow up in 2 week(s).   Specialties: Vascular Surgery, Cardiology, Radiology, Vascular Surgery Why: Dr Jama Only!!!!  Areterial Duplex Ultrasounds of bilateral lower extremities with ABI's Per Dr Jama Request. Contact information: [address] [place] [address] [phone]                 Signed: Gwendlyn JONELLE Shank, NP  08/12/2024, 11:38 AM

## 2024-08-12 NOTE — Discharge Instructions (Addendum)
 Vascular Surgery Discharge instructions:  Do not lift anything heavy for the next 2 weeks.  Do not lift anything more than a gallon of milk.  You may shower when you get home today.  Shower with the old dressing to your groin in place and remove immediately after showering.  Pat the area completely dry and place a Band-Aid over the puncture site for the next 3 days.  You are being discharged on Eliquis  5 mg twice daily, Brilinta  90 mg twice daily and Crestor  5 mg daily.  Do not miss or skip taking any of these medications as it will affect the outcome of your procedure.  Do not drive for the next week.  Do not drive while taking any narcotic medication.  Follow-up with vein and vascular services in 2 weeks as scheduled.

## 2024-08-12 NOTE — Progress Notes (Signed)
 Brief Nutrition Note  RD was asked to speak with patient at the request of nutritional services department. Per nutritional services employee, patient is vegan and has multiple diet restrictions/ preferences. She reports she was told that she was not able to receive fruits and vegetables during her prior hospitalization and would like to speak with RD regarding her diet order.   Noted patient was on a heart healthy diet. RD liberalized patient to regular for wider variety of meal selections. RD informed nutritional services staff that RD would see patient later this morning, as RD was currently about to go in to a meeting.   Spoke with patient at bedside. RN was also in room, stating that patient had multiple concerns about the hospital menu and was just discussing it. Patient was eager to talk to RD; she states that she has multiple diet restrictions and reports concern of lack of available foods that will meet her needs on the current hospital menu. Patient reports she is vegan and consumes mostly vegetables, fruits, beans, and potatoes. She avoids refined grains and seed oils. She has brought in some food from home, including a [employer] protein bar and Dr KellyAnn bone broth packets, which she orders online and prefers due to the vitamin and collagen content.   Patient explains that she follows Dr. Assunta diet, which she is on and followed by the [facility] ( whole-food, plant-based, and oil-free approach that aims to prevent and reverse cardiovascular disease).  Main components of diet consists of (which patient does eat):   Fruits and vegetables: Especially green leafy vegetables, which are rich in antioxidants. Whole grains: Such as oats, quinoa, and brown rice. Legumes: Beans, peas, and lentils. Soy products: Tofu, tempeh, and edamame. Nuts and seeds: In small amounts, for natural fats and nutrients.   Patient avoids:  All animal products: Including meat, poultry, fish, eggs, and  dairy.  All added oils: Such as olive oil, canola oil, and coconut oil.  Processed and refined foods: Including white bread, white rice, and sugary drinks.  Foods high in sugar and refined carbohydrates: Which can contribute to inflammation.   RD informed patient that diet was liberalized to provide with wider variety of meal selections. Patient was not satisfied with this; RD reviewed menu to help patient identify foods that she would be willing to eat off of the hospital menu. Patient reports that she was told that she was not able to eat fruits and vegetables from a prior admission; RD apologized for miscommunication (RD reviewed chart from prior admissions and did not see any reason why diet order would restrict fruits and vegetables as she was on a regular diet or heart healthy diet during prior admissions). RD attempted to offer foods within her diet restrictions, however, she refused as she states all foods are canned or have oils in them. She refuses salad as the salads are made with iceberg lettuce.   Noted patient had a lunch box of food from home. She states her husband can bring her in food sometimes, but does not come to the hospital daily, so this is not a long term solution for her. RD offered [employer] supplements and reviewed ingredients to her; she is unwilling to try as it has sunflower oil in it and only uses a blenderized home smoothie prescribed by Dr. Assunta diet program, which is not a feasible option for her in the hospital. She states that she is being discharged home this afternoon, so there is nothing you  can do to help me now.   She reports concern over how to plan ahead if she is admitted to the hospital again. She states in the past 24 hours she has only consumed chicken broth brought in from home. RD encouraged patient to plan ahead and bring outside foods as needed. RD also suggested contacting Director of Nutritional Services for further nutritional needs,  especially if patient anticipates a longer hospital stay, as the Nutritional Retail Buyer would have the authority to purchase outside food items for her that are otherwise not offered on the standard hospital menu. She states that she is thinking of ordering [employer] foods, which are prepared and dehydrated foods that are approved on her diet program.   Margery ORN, RD, LDN, CDCES Registered Dietitian III Certified Diabetes Care and Education Specialist If unable to reach this RD, please use RD Inpatient group chat on secure chat between hours of 8am-4 pm daily

## 2024-08-12 NOTE — Plan of Care (Signed)

## 2024-08-12 NOTE — Care Management Obs Status (Signed)
 MEDICARE OBSERVATION STATUS NOTIFICATION   Patient Details  Name: Melanie Browning MRN: [id number] Date of Birth: [birth year]   Medicare Observation Status Notification Given:  Yes    Rojelio SHAUNNA Rattler 08/12/2024, 12:21 PM

## 2024-08-16 ENCOUNTER — Encounter (INDEPENDENT_AMBULATORY_CARE_PROVIDER_SITE_OTHER)

## 2024-08-16 ENCOUNTER — Encounter: Payer: Self-pay | Admitting: Vascular Surgery

## 2024-08-16 ENCOUNTER — Ambulatory Visit (INDEPENDENT_AMBULATORY_CARE_PROVIDER_SITE_OTHER): Admitting: Vascular Surgery

## 2024-08-17 ENCOUNTER — Encounter (INDEPENDENT_AMBULATORY_CARE_PROVIDER_SITE_OTHER): Payer: Self-pay | Admitting: Vascular Surgery

## 2024-08-17 NOTE — Telephone Encounter (Signed)
 Can you call and ask what she is referencing, because she had her procedure on 08/11/2024

## 2024-08-27 ENCOUNTER — Encounter (INDEPENDENT_AMBULATORY_CARE_PROVIDER_SITE_OTHER): Payer: Self-pay | Admitting: Vascular Surgery

## 2024-08-30 ENCOUNTER — Ambulatory Visit (INDEPENDENT_AMBULATORY_CARE_PROVIDER_SITE_OTHER): Admitting: Vascular Surgery

## 2024-08-30 ENCOUNTER — Ambulatory Visit (INDEPENDENT_AMBULATORY_CARE_PROVIDER_SITE_OTHER)

## 2024-08-30 ENCOUNTER — Encounter (INDEPENDENT_AMBULATORY_CARE_PROVIDER_SITE_OTHER): Payer: Self-pay | Admitting: Vascular Surgery

## 2024-08-30 VITALS — BP 146/69 | HR 60 | Resp 17 | Ht 68.0 in | Wt 122.0 lb

## 2024-08-30 DIAGNOSIS — I480 Paroxysmal atrial fibrillation: Secondary | ICD-10-CM

## 2024-08-30 DIAGNOSIS — E785 Hyperlipidemia, unspecified: Secondary | ICD-10-CM

## 2024-08-30 DIAGNOSIS — I25118 Atherosclerotic heart disease of native coronary artery with other forms of angina pectoris: Secondary | ICD-10-CM | POA: Diagnosis not present

## 2024-08-30 DIAGNOSIS — I6523 Occlusion and stenosis of bilateral carotid arteries: Secondary | ICD-10-CM | POA: Diagnosis not present

## 2024-08-30 DIAGNOSIS — I70212 Atherosclerosis of native arteries of extremities with intermittent claudication, left leg: Secondary | ICD-10-CM

## 2024-08-30 LAB — VAS US ABI WITH/WO TBI
Left ABI: 1.08
Right ABI: 1.06

## 2024-08-30 NOTE — Progress Notes (Signed)
 MRN : [id number]  Melanie Browning is a 83 y.o. (March 24, 1941) female who presents with chief complaint of check circulation.  History of Present Illness:   The patient returns to the office for followup and review status post angiogram with intervention on 11/12//2025.   Procedure:   Percutaneous transluminal angioplasty and stent placement left superficial femoral artery to 6 mm 2.     Percutaneous transluminal angioplasty and stent placement left tibioperoneal trunk and proximal posterior tibial artery 3.     Infusion of 14 mg of tPA into the left SFA and posterior tibial artery.   4.     Mechanical thrombectomy using the CAT 6 bolt catheter left SFA and posterior tibial.   5.     Coil embolization of the remnant of the great saphenous vein secondary to AV fistula formation    Procedure 01/21/2024:  Percutaneous transluminal angioplasty and stent placement left SFA to 6 mm 2.    Percutaneous transluminal angioplasty and stent placement left tibioperoneal trunk and proximal posterior tibial artery with Esprit stents dilated to 3.5 mm distally and 4 mm proximally 3.     Introduction catheter into the venous branch of the failed saphenous vein bypass 4.    Coil embolization of the venous branch of the failed saphenous vein bypass using Ruby coils   The patient notes stability in the lower extremity symptoms. No interval shortening of the patient's claudication distance and her rest pain symptoms are resolved.  She notes that she is walking her dog without minimal difficulty and without symptoms.  No new ulcers or wounds have occurred since the last visit.   There have been no significant changes to the patient's overall health care.   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   No documented history of amaurosis fugax or recent TIA symptoms. There are  no recent neurological changes noted. No documented history of DVT, PE or superficial thrombophlebitis. The patient denies recent episodes of angina or shortness of breath.    Previous ABI's Rt=1.06 and Lt=1.08 (triphasic signal) (previous ABI's Rt=1.10 and Lt=1.02)    Duplex ultrasound dated 08/30/2024 demonstrates the current arterial reconstruction is widely patent    No outpatient medications have been marked as taking for the 08/30/24 encounter (Appointment) with Jama, Cordella MATSU, MD.    Past Medical History:  Diagnosis Date   Aortic atherosclerosis    Aortic stenosis 06/26/2023   a.) TTE 06/26/2023: mild-mod AS (MPG 18.2 mmHg; AVA 0.73 cm2)   Arthritis    Atherosclerosis of native artery of left lower extremity with intermittent claudication    a.) multiple vascular interventions (PTA, stenting, arthrectomy, and thrombectomy) of LEFT SFA, LEFT popliteal, LEFT posterior tibial, and LEFT anterior tibial arteries due to critical limb ischemia   CAD (coronary artery disease) 11/09/2020   a.) cCTA 11/09/2020: Ca2+ 539 (83rd %'ile for age/sex/race match control); <25% dRCA & pLCx, >70% pLAD; b.) LHC/PCI 11/15/2020: 90% pLAD (2.75 x 18 mm Resolute Onyx DES); c.) MV 06/26/2023: no sig ischemia   Cervical spondylosis  Colitis    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)    a.) s/p multiple vascular interventions   Diastolic dysfunction 06/26/2023   a.) TTE 06/26/2023: EF 60-65%, no RWMAs, G2DD, sev AoV thickening with mild AR, mild-mod AS (MPG 18.2 mmHg; AVA 0.73 cm2)   [place]   Dyslipidemia    History of cervical cancer    a.) s/p TAH   HPV (human papilloma virus) infection    Hyperlipidemia    Hypothyroidism    IGT (impaired glucose tolerance)    Left upper lobe pulmonary nodule    Long term current use of aspirin     Long term current use of ticagrelor  therapy    Macrocytic anemia    Osteoporosis    a.) Tx'd with zolodronic  acid (Reclast) infusions   PAD (peripheral artery disease)    Polymyalgia rheumatica    Skin cancer of nose    Transaminitis    Vaginal dysplasia     Past Surgical History:  Procedure Laterality Date   COLONOSCOPY WITH PROPOFOL  N/A 10/04/2020   Procedure: COLONOSCOPY WITH PROPOFOL ;  Surgeon: Toledo, Ladell POUR, MD;  Location: [facility];  Service: Gastroenterology;  Laterality: N/A;   COLPOSCOPY     CORONARY STENT INTERVENTION N/A 11/15/2020   Procedure: CORONARY STENT INTERVENTION;  Surgeon: Florencio Cara BIRCH, MD;  Location: [facility];  Service: Cardiovascular;  Laterality: N/A;   FEMORAL-POPLITEAL BYPASS GRAFT Left 07/16/2023   Procedure: BYPASS GRAFT FEMORAL-POPLITEAL ARTERY (FEMORAL-POSTERIOR TIBIAL BYPASS W/ SAPHENOUS VEIN- [id number]);  Surgeon: Jama Cordella MATSU, MD;  Location: [facility];  Service: Vascular;  Laterality: Left;   KNEE ARTHROSCOPY Left    LAPAROSCOPIC HYSTERECTOMY     LEFT HEART CATH AND CORONARY ANGIOGRAPHY N/A 11/15/2020   Procedure: LEFT HEART CATH AND CORONARY ANGIOGRAPHY;  Surgeon: Florencio Cara BIRCH, MD;  Location: [facility];  Service: Cardiovascular;  Laterality: N/A;   LOWER EXTREMITY ANGIOGRAPHY Left 09/05/2020   Procedure: LOWER EXTREMITY ANGIOGRAPHY;  Surgeon: Jama Cordella MATSU, MD;  Location: [facility];  Service: Cardiovascular;  Laterality: Left;   LOWER EXTREMITY ANGIOGRAPHY Left 04/10/2021   Procedure: LOWER EXTREMITY ANGIOGRAPHY;  Surgeon: Jama Cordella MATSU, MD;  Location: [facility];  Service: Cardiovascular;  Laterality: Left;   LOWER EXTREMITY ANGIOGRAPHY Left 11/20/2021   Procedure: Lower Extremity Angiography;  Surgeon: Jama Cordella MATSU, MD;  Location: [facility];  Service: Cardiovascular;  Laterality: Left;   LOWER EXTREMITY ANGIOGRAPHY Left 06/05/2022   Procedure: Lower Extremity Angiography;  Surgeon: Jama Cordella MATSU, MD;  Location: [facility];  Service: Cardiovascular;   Laterality: Left;   LOWER EXTREMITY ANGIOGRAPHY Left 07/23/2022   Procedure: Lower Extremity Angiography;  Surgeon: Jama Cordella MATSU, MD;  Location: [facility];  Service: Cardiovascular;  Laterality: Left;   LOWER EXTREMITY ANGIOGRAPHY Left 12/27/2022   Procedure: Lower Extremity Angiography;  Surgeon: Jama Cordella MATSU, MD;  Location: [facility];  Service: Cardiovascular;  Laterality: Left;   LOWER EXTREMITY ANGIOGRAPHY Left 06/17/2023   Procedure: Lower Extremity Angiography;  Surgeon: Jama Cordella MATSU, MD;  Location: [facility];  Service: Cardiovascular;  Laterality: Left;   LOWER EXTREMITY ANGIOGRAPHY Left 01/20/2024   Procedure: Lower Extremity Angiography;  Surgeon: Jama Cordella MATSU, MD;  Location: [facility];  Service: Cardiovascular;  Laterality: Left;   LOWER EXTREMITY ANGIOGRAPHY Left 01/21/2024   Procedure: Lower Extremity Angiography;  Surgeon:  Kayliegh Boyers, Cordella MATSU, MD;  Location: [facility];  Service: Cardiovascular;  Laterality: Left;   LOWER EXTREMITY ANGIOGRAPHY Left 08/11/2024   Procedure: Lower Extremity Angiography;  Surgeon: Jama Cordella MATSU, MD;  Location: [facility];  Service: Cardiovascular;  Laterality: Left;   LOWER EXTREMITY INTERVENTION Left 01/20/2024   Procedure: LOWER EXTREMITY INTERVENTION;  Surgeon: Jama Cordella MATSU, MD;  Location: [facility];  Service: Cardiovascular;  Laterality: Left;   TOTAL ABDOMINAL HYSTERECTOMY      Social History Social History   Tobacco Use   Smoking status: Never   Smokeless tobacco: Never  Vaping Use   Vaping status: Never Used  Substance Use Topics   Alcohol  use: Yes    Alcohol /week: 1.0 standard drink of alcohol     Types: 1 Glasses of wine per week    Comment: 5   Drug use: Never    Family History Family History  Problem Relation Age of Onset   Cancer Mother    Pancreatic cancer Mother    Heart failure Mother    Heart attack Father 2    Heart disease Father     Allergies  Allergen Reactions   Lactose Other (See Comments) and Nausea And Vomiting    Gi upset and diarrhea  Other reaction(s): Other (See Comments)  Gi upset and diarrhea  Other reaction(s): Other (See Comments) Gi upset and diarrhea Gi upset and diarrhea Gi upset and diarrhea    Gi upset and diarrhea    Gi upset and diarrhea Gi upset and diarrhea Gi upset and diarrhea  Gi upset and diarrhea    Gi upset and diarrhea  Other reaction(s): Other (See Comments) Gi upset and diarrhea Gi upset and diarrhea Gi upset and diarrhea  Gi upset and diarrhea  Gi upset and diarrhea Gi upset and diarrhea Gi upset and diarrhea     REVIEW OF SYSTEMS (Negative unless checked)  Constitutional: [] Weight loss  [] Fever  [] Chills Cardiac: [] Chest pain   [] Chest pressure   [] Palpitations   [] Shortness of breath when laying flat   [] Shortness of breath with exertion. Vascular:  [x] Pain in legs with walking   [] Pain in legs at rest  [] History of DVT   [] Phlebitis   [] Swelling in legs   [] Varicose veins   [] Non-healing ulcers Pulmonary:   [] Uses home oxygen   [] Productive cough   [] Hemoptysis   [] Wheeze  [] COPD   [] Asthma Neurologic:  [] Dizziness   [] Seizures   [] History of stroke   [] History of TIA  [] Aphasia   [] Vissual changes   [] Weakness or numbness in arm   [] Weakness or numbness in leg Musculoskeletal:   [] Joint swelling   [] Joint pain   [] Low back pain Hematologic:  [] Easy bruising  [] Easy bleeding   [] Hypercoagulable state   [] Anemic Gastrointestinal:  [] Diarrhea   [] Vomiting  [] Gastroesophageal reflux/heartburn   [] Difficulty swallowing. Genitourinary:  [] Chronic kidney disease   [] Difficult urination  [] Frequent urination   [] Blood in urine Skin:  [] Rashes   [] Ulcers  Psychological:  [] History of anxiety   []  History of major depression.  Physical Examination  There were no vitals filed for this visit. There is no height or weight on file to calculate BMI. Gen:  WD/WN, NAD Head: /AT, No temporalis wasting.  Ear/Nose/Throat: Hearing grossly intact, nares w/o erythema or drainage Eyes: PER, EOMI, sclera nonicteric.  Neck: Supple, no masses.  No bruit or JVD.  Pulmonary:  Good air movement, no audible wheezing, no use of accessory  muscles.  Cardiac: RRR, normal S1, S2, no Murmurs. Vascular:  mild trophic changes, no open wounds Vessel Right Left  Radial Palpable Palpable  PT Palpable  Palpable  DP Palpable Not Palpable  Gastrointestinal: soft, non-distended. No guarding/no peritoneal signs.  Musculoskeletal: M/S 5/5 throughout.  No visible deformity.  Neurologic: CN 2-12 intact. Pain and light touch intact in extremities.  Symmetrical.  Speech is fluent. Motor exam as listed above. Psychiatric: Judgment intact, Mood & affect appropriate for pt's clinical situation. Dermatologic: No rashes or ulcers noted.  No changes consistent with cellulitis.   CBC Lab Results  Component Value Date   WBC 9.1 02/02/2024   HGB 9.1 (L) 02/02/2024   HCT 27.7 (L) 02/02/2024   MCV 92.0 02/02/2024   PLT 370 02/02/2024    BMET    Component Value Date/Time   NA 134 (L) 02/02/2024 0530   K 3.9 02/02/2024 0530   CL 104 02/02/2024 0530   CO2 21 (L) 02/02/2024 0530   GLUCOSE 93 02/02/2024 0530   BUN 18 08/11/2024 1305   CREATININE 0.79 08/11/2024 1305   CALCIUM  8.5 (L) 02/02/2024 0530   GFRNONAA >60 08/11/2024 1305   CrCl cannot be calculated (Unknown ideal weight.).  COAG No results found for: INR, PROTIME  Radiology PERIPHERAL VASCULAR CATHETERIZATION Result Date: 08/11/2024 See surgical note for result.  VAS US  LOWER EXTREMITY ARTERIAL DUPLEX Result Date: 08/11/2024 LOWER EXTREMITY ARTERIAL DUPLEX STUDY Patient Name:  STEVEN VEAZIE  Date of Exam:   08/09/2024 Medical Rec #: [id number]      Accession #:    [id number] Date of Birth: Apr 27, 1941       Patient Gender: F Patient Age:   24 years Exam Location:  [facility] Procedure:       VAS US  LOWER EXTREMITY ARTERIAL DUPLEX Referring Phys: CORDELLA SHAWL --------------------------------------------------------------------------------  Indications: Claudication, peripheral artery disease, and Absent LLE pulse.  Vascular Interventions: 10/13/2019: Left popliteal artery PTA;                         10/14/2019: Left popliteal artery reconstruction;                         02/03/2020: Left popliteal artery angioplasty;                         04/10/2021: Left distal SFA/Popliteal Artery                         thrombectomy/PTA/stent;                         11/20/2021: PTA and Stent placement left SFA and                         Popliteal Artery. PTA Left tibioperoneal trunk and                         Posterior Tibial Artery.                         06/05/2022: Left SFA/popliteal thrombectomy/PTA/stent with                         PTA PTA;  12/27/22: Left SFA/popliteal stent with TP trunk/PTA                         angioplasties;                         07/16/2023: Left CFA-PTA bypass graft;                         01/21/24: Left SFA & PT trunk stents;. Current ABI:            N/A Comparison Study: 07/04/2024 Performing Technologist: Jerel Croak RVT  Examination Guidelines: A complete evaluation includes B-mode imaging, spectral Doppler, color Doppler, and power Doppler as needed of all accessible portions of each vessel. Bilateral testing is considered an integral part of a complete examination. Limited examinations for reoccurring indications may be performed as noted.   +-----------+--------+-----+--------+----------+----------------+ LEFT       PSV cm/sRatioStenosisWaveform  Comments         +-----------+--------+-----+--------+----------+----------------+ CFA Mid    132                  monophasic                 +-----------+--------+-----+--------+----------+----------------+ DFA        45                   monophasic                  +-----------+--------+-----+--------+----------+----------------+ SFA Prox                occluded                           +-----------+--------+-----+--------+----------+----------------+ SFA Mid                 occluded                           +-----------+--------+-----+--------+----------+----------------+ SFA Distal              occluded                           +-----------+--------+-----+--------+----------+----------------+ POP Distal              occluded                           +-----------+--------+-----+--------+----------+----------------+ ATA Distal 17                   monophasic                 +-----------+--------+-----+--------+----------+----------------+ PTA Distal 20                   monophasic                 +-----------+--------+-----+--------+----------+----------------+ PERO Distal                               no flow detected +-----------+--------+-----+--------+----------+----------------+  Summary: Left: SFA, Pop and stents occluded ' Seroma in groin now measures 2.74cm.  See table(s) above for measurements and observations. Electronically signed by Cordella Shawl MD on 08/11/2024 at 11:15:24 AM.    Final  Assessment/Plan 1. Atherosclerosis of native artery of left lower extremity with intermittent claudication (Primary) Recommend:  The patient is status post successful angiogram with intervention.  The patient reports that the claudication symptoms and leg pain has improved.   The patient denies lifestyle limiting changes at this point in time.  No further invasive studies, angiography or surgery at this time. The patient should continue walking and begin a more formal exercise program.  The patient should continue antiplatelet therapy and aggressive treatment of the lipid abnormalities  Continued surveillance is indicated as atherosclerosis is likely to progress with time.    Patient should undergo  noninvasive studies as ordered. The patient will follow up with me to review the studies.  - VAS US  LOWER EXTREMITY ARTERIAL DUPLEX; Future - VAS US  ABI WITH/WO TBI; Future  2. Bilateral carotid artery stenosis Recommend:  Given the patient's asymptomatic subcritical stenosis no further invasive testing or surgery at this time.  Duplex ultrasound shows 1-39% stenosis bilaterally.  Continue antiplatelet therapy as prescribed Continue management of CAD, HTN and Hyperlipidemia Healthy heart diet,  encouraged exercise at least 4 times per week  Follow up in 36 months with duplex ultrasound and physical exam   3. Coronary artery disease of native artery of native heart with stable angina pectoris Continue cardiac and antihypertensive medications as already ordered and reviewed, no changes at this time.  Continue statin as ordered and reviewed, no changes at this time  Nitrates PRN for chest pain  4. Paroxysmal atrial fibrillation (HCC) Continue antiarrhythmia medications as already ordered, these medications have been reviewed and there are no changes at this time.  Continue anticoagulation as ordered by Cardiology Service  5. Dyslipidemia Continue statin as ordered and reviewed, no changes at this time    Cordella Shawl, MD  08/30/2024 12:58 PM

## 2024-08-31 ENCOUNTER — Ambulatory Visit: Attending: Cardiovascular Disease

## 2024-08-31 DIAGNOSIS — I350 Nonrheumatic aortic (valve) stenosis: Secondary | ICD-10-CM | POA: Insufficient documentation

## 2024-08-31 LAB — ECHOCARDIOGRAM COMPLETE
AR max vel: 1.05 cm2
AV Area VTI: 1.2 cm2
AV Area mean vel: 1.06 cm2
AV Mean grad: 14 mmHg
AV Peak grad: 26 mmHg
Ao pk vel: 2.55 m/s
Area-P 1/2: 2.99 cm2
MV M vel: 4.35 m/s
MV Peak grad: 75.7 mmHg
MV VTI: 1.8 cm2
P 1/2 time: 446 ms
S' Lateral: 2.3 cm

## 2024-09-01 ENCOUNTER — Encounter (INDEPENDENT_AMBULATORY_CARE_PROVIDER_SITE_OTHER): Payer: Self-pay | Admitting: Vascular Surgery

## 2024-09-01 ENCOUNTER — Encounter: Payer: Self-pay | Admitting: Cardiovascular Disease

## 2024-09-01 NOTE — Telephone Encounter (Signed)
 Please advise

## 2024-09-03 ENCOUNTER — Ambulatory Visit: Payer: Self-pay | Admitting: Cardiovascular Disease

## 2024-09-04 ENCOUNTER — Encounter (INDEPENDENT_AMBULATORY_CARE_PROVIDER_SITE_OTHER): Payer: Self-pay | Admitting: Vascular Surgery

## 2024-09-06 ENCOUNTER — Encounter (INDEPENDENT_AMBULATORY_CARE_PROVIDER_SITE_OTHER): Payer: Self-pay | Admitting: Vascular Surgery

## 2024-09-06 ENCOUNTER — Other Ambulatory Visit (INDEPENDENT_AMBULATORY_CARE_PROVIDER_SITE_OTHER): Payer: Self-pay | Admitting: Vascular Surgery

## 2024-09-06 DIAGNOSIS — I739 Peripheral vascular disease, unspecified: Secondary | ICD-10-CM

## 2024-09-06 MED ORDER — CLOPIDOGREL BISULFATE 75 MG PO TABS: 75.0000 mg | ORAL_TABLET | Freq: Every day | ORAL | 3 refills | Status: AC

## 2024-09-07 ENCOUNTER — Telehealth (INDEPENDENT_AMBULATORY_CARE_PROVIDER_SITE_OTHER): Payer: Self-pay

## 2024-09-07 NOTE — Telephone Encounter (Signed)
 Pharmacy called at this time in reference to refilling patients medication(Plavix ). According to pharmacy patient is taking plavix , brilinta , and eliquis . Spoke with Dr. Jama and Brilinta  has been discontinued. Notified pharmacy at this time.

## 2024-09-09 ENCOUNTER — Encounter: Payer: Self-pay | Admitting: Cardiovascular Disease

## 2024-09-29 ENCOUNTER — Ambulatory Visit

## 2024-10-11 ENCOUNTER — Ambulatory Visit (INDEPENDENT_AMBULATORY_CARE_PROVIDER_SITE_OTHER): Admitting: Vascular Surgery

## 2024-10-11 ENCOUNTER — Encounter (INDEPENDENT_AMBULATORY_CARE_PROVIDER_SITE_OTHER)

## 2024-10-13 NOTE — Progress Notes (Signed)
 "                                                                      MRN : [id number]  Melanie Browning is a 84 y.o. ([birth year]) female who presents with chief complaint of check circulation.  History of Present Illness:   The patient returns to the office for followup and review status post angiogram with intervention on 11/12//2025.    Procedure:   Percutaneous transluminal angioplasty and stent placement left superficial femoral artery to 6 mm 2.     Percutaneous transluminal angioplasty and stent placement left tibioperoneal trunk and proximal posterior tibial artery 3.     Infusion of 14 mg of tPA into the left SFA and posterior tibial artery.   4.     Mechanical thrombectomy using the CAT 6 bolt catheter left SFA and posterior tibial.   5.     Coil embolization of the remnant of the great saphenous vein secondary to AV fistula formation    Procedure 01/21/2024:  Percutaneous transluminal angioplasty and stent placement left SFA to 6 mm 2.    Percutaneous transluminal angioplasty and stent placement left tibioperoneal trunk and proximal posterior tibial artery with Esprit stents dilated to 3.5 mm distally and 4 mm proximally 3.     Introduction catheter into the venous branch of the failed saphenous vein bypass 4.    Coil embolization of the venous branch of the failed saphenous vein bypass using Ruby coils   The patient notes stability in the lower extremity symptoms. No interval shortening of the patient's claudication distance and her rest pain symptoms are resolved.  She notes that she is walking without symptoms.  No new ulcers or wounds have occurred since the last visit.   There have been no significant changes to the patient's overall health care.   No documented history of amaurosis fugax or recent TIA symptoms. There are no recent neurological changes noted. No documented history of DVT, PE or superficial thrombophlebitis. The patient denies recent episodes of angina or  shortness of breath.    Previous ABI's Rt=1.15 and Lt=1.09 (triphasic signal) (previous ABI's Rt=1.06 and Lt=1.08 (triphasic signal))    Duplex ultrasound today demonstrates the current arterial reconstruction is widely patent, velocities are uniform from the common femoral distally.  Active Medications[1]  Past Medical History:  Diagnosis Date   Aortic atherosclerosis    Aortic stenosis 06/26/2023   a.) TTE 06/26/2023: mild-mod AS (MPG 18.2 mmHg; AVA 0.73 cm2)   Arthritis    Atherosclerosis of native artery of left lower extremity with intermittent claudication    a.) multiple vascular interventions (PTA, stenting, arthrectomy, and thrombectomy) of LEFT SFA, LEFT popliteal, LEFT posterior tibial, and LEFT anterior tibial arteries due to critical limb ischemia   CAD (coronary artery disease) 11/09/2020   a.) cCTA 11/09/2020: Ca2+ 539 (83rd %'ile for age/sex/race match control); <25% dRCA & pLCx, >70% pLAD; b.) LHC/PCI 11/15/2020: 90% pLAD (2.75 x 18 mm Resolute Onyx DES); c.) MV 06/26/2023: no sig ischemia   Cervical spondylosis    Colitis    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)    a.) s/p multiple vascular interventions   Diastolic dysfunction 06/26/2023   a.) TTE 06/26/2023: EF 60-65%, no  RWMAs, G2DD, sev AoV thickening with mild AR, mild-mod AS (MPG 18.2 mmHg; AVA 0.73 cm2)   [place]   Dyslipidemia    History of cervical cancer    a.) s/p TAH   HPV (human papilloma virus) infection    Hyperlipidemia    Hypothyroidism    IGT (impaired glucose tolerance)    Left upper lobe pulmonary nodule    Long term current use of aspirin     Long term current use of ticagrelor  therapy    Macrocytic anemia    Osteoporosis    a.) Tx'd with zolodronic acid (Reclast) infusions   PAD (peripheral artery disease)    Polymyalgia rheumatica    Skin cancer of nose    Transaminitis    Vaginal dysplasia     Past Surgical History:  Procedure  Laterality Date   COLONOSCOPY WITH PROPOFOL  N/A 10/04/2020   Procedure: COLONOSCOPY WITH PROPOFOL ;  Surgeon: Toledo, Ladell POUR, MD;  Location: [facility];  Service: Gastroenterology;  Laterality: N/A;   COLPOSCOPY     CORONARY STENT INTERVENTION N/A 11/15/2020   Procedure: CORONARY STENT INTERVENTION;  Surgeon: Florencio Cara BIRCH, MD;  Location: [facility];  Service: Cardiovascular;  Laterality: N/A;   FEMORAL-POPLITEAL BYPASS GRAFT Left 07/16/2023   Procedure: BYPASS GRAFT FEMORAL-POPLITEAL ARTERY (FEMORAL-POSTERIOR TIBIAL BYPASS W/ SAPHENOUS VEIN- [id number]);  Surgeon: Jama Cordella MATSU, MD;  Location: [facility];  Service: Vascular;  Laterality: Left;   KNEE ARTHROSCOPY Left    LAPAROSCOPIC HYSTERECTOMY     LEFT HEART CATH AND CORONARY ANGIOGRAPHY N/A 11/15/2020   Procedure: LEFT HEART CATH AND CORONARY ANGIOGRAPHY;  Surgeon: Florencio Cara BIRCH, MD;  Location: [facility];  Service: Cardiovascular;  Laterality: N/A;   LOWER EXTREMITY ANGIOGRAPHY Left 09/05/2020   Procedure: LOWER EXTREMITY ANGIOGRAPHY;  Surgeon: Jama Cordella MATSU, MD;  Location: [facility];  Service: Cardiovascular;  Laterality: Left;   LOWER EXTREMITY ANGIOGRAPHY Left 04/10/2021   Procedure: LOWER EXTREMITY ANGIOGRAPHY;  Surgeon: Jama Cordella MATSU, MD;  Location: [facility];  Service: Cardiovascular;  Laterality: Left;   LOWER EXTREMITY ANGIOGRAPHY Left 11/20/2021   Procedure: Lower Extremity Angiography;  Surgeon: Jama Cordella MATSU, MD;  Location: [facility];  Service: Cardiovascular;  Laterality: Left;   LOWER EXTREMITY ANGIOGRAPHY Left 06/05/2022   Procedure: Lower Extremity Angiography;  Surgeon: Jama Cordella MATSU, MD;  Location: [facility];  Service: Cardiovascular;  Laterality: Left;   LOWER EXTREMITY ANGIOGRAPHY Left 07/23/2022   Procedure: Lower Extremity Angiography;  Surgeon: Jama Cordella MATSU, MD;  Location: [facility];  Service:  Cardiovascular;  Laterality: Left;   LOWER EXTREMITY ANGIOGRAPHY Left 12/27/2022   Procedure: Lower Extremity Angiography;  Surgeon: Jama Cordella MATSU, MD;  Location: [facility];  Service: Cardiovascular;  Laterality: Left;   LOWER EXTREMITY ANGIOGRAPHY Left 06/17/2023   Procedure: Lower Extremity Angiography;  Surgeon: Jama Cordella MATSU, MD;  Location: [facility];  Service: Cardiovascular;  Laterality: Left;   LOWER EXTREMITY ANGIOGRAPHY Left 01/20/2024   Procedure: Lower Extremity Angiography;  Surgeon: Jama Cordella MATSU, MD;  Location: [facility];  Service: Cardiovascular;  Laterality: Left;   LOWER EXTREMITY ANGIOGRAPHY Left 01/21/2024   Procedure: Lower Extremity Angiography;  Surgeon: Jama Cordella MATSU, MD;  Location: [facility];  Service: Cardiovascular;  Laterality: Left;   LOWER EXTREMITY ANGIOGRAPHY Left 08/11/2024   Procedure: Lower Extremity Angiography;  Surgeon: Jama Cordella  G, MD;  Location: [facility];  Service: Cardiovascular;  Laterality: Left;   LOWER EXTREMITY INTERVENTION Left 01/20/2024   Procedure: LOWER EXTREMITY INTERVENTION;  Surgeon: Jama Cordella MATSU, MD;  Location: [facility];  Service: Cardiovascular;  Laterality: Left;   TOTAL ABDOMINAL HYSTERECTOMY      Social History Social History[2]  Family History Family History  Problem Relation Age of Onset   Cancer Mother    Pancreatic cancer Mother    Heart failure Mother    Heart attack Father 63   Heart disease Father     Allergies[3]   REVIEW OF SYSTEMS (Negative unless checked)  Constitutional: [] Weight loss  [] Fever  [] Chills Cardiac: [] Chest pain   [] Chest pressure   [] Palpitations   [] Shortness of breath when laying flat   [] Shortness of breath with exertion. Vascular:  [x] Pain in legs with walking   [] Pain in legs at rest  [] History of DVT   [] Phlebitis   [] Swelling in legs   [] Varicose veins   [] Non-healing ulcers Pulmonary:   [] Uses  home oxygen   [] Productive cough   [] Hemoptysis   [] Wheeze  [] COPD   [] Asthma Neurologic:  [] Dizziness   [] Seizures   [] History of stroke   [] History of TIA  [] Aphasia   [] Vissual changes   [] Weakness or numbness in arm   [] Weakness or numbness in leg Musculoskeletal:   [] Joint swelling   [] Joint pain   [] Low back pain Hematologic:  [] Easy bruising  [] Easy bleeding   [] Hypercoagulable state   [] Anemic Gastrointestinal:  [] Diarrhea   [] Vomiting  [] Gastroesophageal reflux/heartburn   [] Difficulty swallowing. Genitourinary:  [] Chronic kidney disease   [] Difficult urination  [] Frequent urination   [] Blood in urine Skin:  [] Rashes   [] Ulcers  Psychological:  [] History of anxiety   []  History of major depression.  Physical Examination  There were no vitals filed for this visit. There is no height or weight on file to calculate BMI. Gen: WD/WN, NAD Head: [place]/AT, No temporalis wasting.  Ear/Nose/Throat: Hearing grossly intact, nares w/o erythema or drainage Eyes: PER, EOMI, sclera nonicteric.  Neck: Supple, no masses.  No bruit or JVD.  Pulmonary:  Good air movement, no audible wheezing, no use of accessory muscles.  Cardiac: RRR, normal S1, S2, no Murmurs. Vascular:  mild trophic changes, no open wounds Vessel Right Left  Radial Palpable Palpable  PT Palpable  Palpable  DP Palpable Not Palpable  Gastrointestinal: soft, non-distended. No guarding/no peritoneal signs.  Musculoskeletal: M/S 5/5 throughout.  No visible deformity.  Neurologic: CN 2-12 intact. Pain and light touch intact in extremities.  Symmetrical.  Speech is fluent. Motor exam as listed above. Psychiatric: Judgment intact, Mood & affect appropriate for pt's clinical situation. Dermatologic: venous rashes or ulcers noted.  No changes consistent with cellulitis.   CBC Lab Results  Component Value Date   WBC 9.1 02/02/2024   HGB 9.1 (L) 02/02/2024   HCT 27.7 (L) 02/02/2024   MCV 92.0 02/02/2024   PLT 370 02/02/2024     BMET    Component Value Date/Time   NA 134 (L) 02/02/2024 0530   K 3.9 02/02/2024 0530   CL 104 02/02/2024 0530   CO2 21 (L) 02/02/2024 0530   GLUCOSE 93 02/02/2024 0530   BUN 18 08/11/2024 1305   CREATININE 0.79 08/11/2024 1305   CALCIUM  8.5 (L) 02/02/2024 0530   GFRNONAA >60 08/11/2024 1305   CrCl cannot be calculated (Patient's most recent lab result is older than the maximum 21  days allowed.).  COAG No results found for: INR, PROTIME  Radiology No results found.   Assessment/Plan 1. Atherosclerosis of native artery of left lower extremity with intermittent claudication (Primary) Recommend:   The patient is status post successful angiogram with intervention.  The patient reports that the claudication symptoms and leg pain has improved.   The patient denies lifestyle limiting changes at this point in time.   No further invasive studies, angiography or surgery at this time. The patient should continue walking and begin a more formal exercise program.  The patient should continue antiplatelet therapy and aggressive treatment of the lipid abnormalities   Continued surveillance is indicated as atherosclerosis is likely to progress with time.     Patient should undergo noninvasive studies as ordered. The patient will follow up with me to review the studies.  - VAS US  ABI WITH/WO TBI; Future  2. Popliteal artery injury, left, sequela Recommend:   The patient is status post successful angiogram with intervention.  The patient reports that the claudication symptoms and leg pain has improved.   The patient denies lifestyle limiting changes at this point in time.   No further invasive studies, angiography or surgery at this time. The patient should continue walking and begin a more formal exercise program.  The patient should continue antiplatelet therapy and aggressive treatment of the lipid abnormalities   Continued surveillance is indicated as atherosclerosis is  likely to progress with time.     Patient should undergo noninvasive studies as ordered. The patient will follow up with me to review the studies.   3. Chronic venous insufficiency No surgery or intervention at this point in time.   The patient is CEAP [id number]   I have discussed with the patient venous insufficiency and why it  causes symptoms. I have discussed with the patient the chronic skin changes that accompany venous insufficiency and the long term sequela such as infection and ulceration.  Patient will begin wearing graduated compression stockings or compression wraps on a daily basis.  The patient will put the compression on first thing in the morning and removing them in the evening. The patient is instructed specifically not to sleep in the compression.    In addition, behavioral modification including several periods of elevation of the lower extremities during the day will be continued. I have demonstrated that proper elevation is a position with the ankles at heart level.  The patient is instructed to begin routine exercise, especially walking on a daily basis  4. Coronary artery disease of native artery of native heart with stable angina pectoris Continue cardiac and antihypertensive medications as already ordered and reviewed, no changes at this time.  Continue statin as ordered and reviewed, no changes at this time  Nitrates PRN for chest pain  5. Paroxysmal atrial fibrillation (HCC) Continue antiarrhythmia medications as already ordered, these medications have been reviewed and there are no changes at this time.  Continue anticoagulation as ordered by Cardiology Service  6. Mixed hyperlipidemia Continue statin as ordered and reviewed, no changes at this time    Cordella Shawl, MD  10/13/2024 1:38 PM      [1]  No outpatient medications have been marked as taking for the 10/14/24 encounter (Appointment) with Shawl, Cordella MATSU, MD.  [2]  Social  History Tobacco Use   Smoking status: Never   Smokeless tobacco: Never  Vaping Use   Vaping status: Never Used  Substance Use Topics   Alcohol  use: Yes  Alcohol /week: 1.0 standard drink of alcohol     Types: 1 Glasses of wine per week    Comment: 5   Drug use: Never  [3]  Allergies Allergen Reactions   Lactose Other (See Comments) and Nausea And Vomiting    Gi upset and diarrhea  Other reaction(s): Other (See Comments)  Gi upset and diarrhea  Other reaction(s): Other (See Comments) Gi upset and diarrhea Gi upset and diarrhea Gi upset and diarrhea    Gi upset and diarrhea    Gi upset and diarrhea Gi upset and diarrhea Gi upset and diarrhea  Gi upset and diarrhea    Gi upset and diarrhea  Other reaction(s): Other (See Comments) Gi upset and diarrhea Gi upset and diarrhea Gi upset and diarrhea  Gi upset and diarrhea  Gi upset and diarrhea Gi upset and diarrhea Gi upset and diarrhea   "

## 2024-10-14 ENCOUNTER — Encounter (INDEPENDENT_AMBULATORY_CARE_PROVIDER_SITE_OTHER): Payer: Self-pay | Admitting: Vascular Surgery

## 2024-10-14 ENCOUNTER — Ambulatory Visit (INDEPENDENT_AMBULATORY_CARE_PROVIDER_SITE_OTHER)

## 2024-10-14 ENCOUNTER — Ambulatory Visit (INDEPENDENT_AMBULATORY_CARE_PROVIDER_SITE_OTHER): Admitting: Vascular Surgery

## 2024-10-14 VITALS — BP 151/74 | HR 64 | Resp 16 | Ht 68.0 in | Wt 129.0 lb

## 2024-10-14 DIAGNOSIS — I480 Paroxysmal atrial fibrillation: Secondary | ICD-10-CM | POA: Diagnosis not present

## 2024-10-14 DIAGNOSIS — I25118 Atherosclerotic heart disease of native coronary artery with other forms of angina pectoris: Secondary | ICD-10-CM | POA: Diagnosis not present

## 2024-10-14 DIAGNOSIS — I70212 Atherosclerosis of native arteries of extremities with intermittent claudication, left leg: Secondary | ICD-10-CM

## 2024-10-14 DIAGNOSIS — I872 Venous insufficiency (chronic) (peripheral): Secondary | ICD-10-CM | POA: Diagnosis not present

## 2024-10-14 DIAGNOSIS — S85002S Unspecified injury of popliteal artery, left leg, sequela: Secondary | ICD-10-CM

## 2024-10-14 DIAGNOSIS — E782 Mixed hyperlipidemia: Secondary | ICD-10-CM

## 2024-10-18 LAB — VAS US ABI WITH/WO TBI
Left ABI: 1.09
Right ABI: 1.15

## 2024-10-19 ENCOUNTER — Other Ambulatory Visit: Payer: Self-pay | Admitting: Gastroenterology

## 2024-10-27 ENCOUNTER — Inpatient Hospital Stay: Attending: Obstetrics and Gynecology | Admitting: Obstetrics and Gynecology

## 2024-10-27 VITALS — BP 118/68 | HR 59 | Temp 98.7°F | Resp 20 | Wt 132.6 lb

## 2024-10-27 DIAGNOSIS — M810 Age-related osteoporosis without current pathological fracture: Secondary | ICD-10-CM | POA: Diagnosis not present

## 2024-10-27 DIAGNOSIS — Z7901 Long term (current) use of anticoagulants: Secondary | ICD-10-CM | POA: Insufficient documentation

## 2024-10-27 DIAGNOSIS — M1812 Unilateral primary osteoarthritis of first carpometacarpal joint, left hand: Secondary | ICD-10-CM | POA: Insufficient documentation

## 2024-10-27 DIAGNOSIS — I70203 Unspecified atherosclerosis of native arteries of extremities, bilateral legs: Secondary | ICD-10-CM | POA: Insufficient documentation

## 2024-10-27 DIAGNOSIS — N903 Dysplasia of vulva, unspecified: Secondary | ICD-10-CM | POA: Insufficient documentation

## 2024-10-27 DIAGNOSIS — Z800 Family history of malignant neoplasm of digestive organs: Secondary | ICD-10-CM | POA: Diagnosis not present

## 2024-10-27 DIAGNOSIS — I2510 Atherosclerotic heart disease of native coronary artery without angina pectoris: Secondary | ICD-10-CM | POA: Insufficient documentation

## 2024-10-27 DIAGNOSIS — Z79899 Other long term (current) drug therapy: Secondary | ICD-10-CM | POA: Diagnosis not present

## 2024-10-27 DIAGNOSIS — Z809 Family history of malignant neoplasm, unspecified: Secondary | ICD-10-CM | POA: Diagnosis not present

## 2024-10-27 DIAGNOSIS — I252 Old myocardial infarction: Secondary | ICD-10-CM | POA: Diagnosis not present

## 2024-10-27 DIAGNOSIS — R87620 Atypical squamous cells of undetermined significance on cytologic smear of vagina (ASC-US): Secondary | ICD-10-CM | POA: Diagnosis not present

## 2024-10-27 DIAGNOSIS — I4891 Unspecified atrial fibrillation: Secondary | ICD-10-CM | POA: Diagnosis not present

## 2024-10-27 DIAGNOSIS — Z8249 Family history of ischemic heart disease and other diseases of the circulatory system: Secondary | ICD-10-CM | POA: Diagnosis not present

## 2024-10-27 DIAGNOSIS — E785 Hyperlipidemia, unspecified: Secondary | ICD-10-CM | POA: Diagnosis not present

## 2024-10-27 DIAGNOSIS — Z90710 Acquired absence of both cervix and uterus: Secondary | ICD-10-CM | POA: Diagnosis not present

## 2024-10-27 DIAGNOSIS — Z85828 Personal history of other malignant neoplasm of skin: Secondary | ICD-10-CM | POA: Diagnosis not present

## 2024-10-27 DIAGNOSIS — Z90722 Acquired absence of ovaries, bilateral: Secondary | ICD-10-CM | POA: Insufficient documentation

## 2024-10-27 DIAGNOSIS — Z8541 Personal history of malignant neoplasm of cervix uteri: Secondary | ICD-10-CM | POA: Diagnosis not present

## 2024-10-27 DIAGNOSIS — Z7902 Long term (current) use of antithrombotics/antiplatelets: Secondary | ICD-10-CM | POA: Insufficient documentation

## 2024-10-27 DIAGNOSIS — Z8619 Personal history of other infectious and parasitic diseases: Secondary | ICD-10-CM

## 2024-10-27 NOTE — Progress Notes (Signed)
 "  GYN ONC Interval [facility]  [phone] [phone]  Patient Care Team: Epifanio Alm SQUIBB, MD as PCP - General (Infectious Diseases) Darron Deatrice LABOR, MD as PCP - Cardiology (Cardiology) Maurie Rayfield BIRCH, RN as Oncology Nurse Navigator   Name of the patient: Melanie Browning  [id number]  [birth year]   Date of visit: 10/27/2024  Gynecologic Oncology Interval Visit   Referring Provider: Dr. 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 G1P1 female who is seen in consultation from Melanie. Epifanio for vulvar dysplasia. She is s/p TAH-BSO on 09/05/2015, but final surgical path was negative for dysplasia.  After surgery, she was closely monitored every 6 months with colposcopies and PAPs.     04/21/2024 Pap ASCUS/HPV negative. She returns today for repeat exam with Pap. No new complaints. Still using vaginal estrogen but only right before her visits with us .   On 08/11/2024 she had another procedure for Atherosclerotic occlusive disease bilateral lower extremities with left lower extremity with rest pain.   Gynecologic Oncology History Melanie Browning is a pleasant G1P1 female who is seen in consultation from Melanie. Epifanio for dysplasia. She is s/p TAH-BSO on 09/05/2015, but final surgical path was negative for dysplasia.  After surgery, she was closely monitored every 6 months with colposcopies and PAPs. Prior treatment in [place]  for cervical and vaginal dysplasia from 2016 to 08/2019.   9/16 LSIL PAP, HR HPV+, Colposcopic biopsies 10/16, CIN1 on ectocervix, ECC negative. 11/16 Cone biopsy LSIL, clear margins. ECC negative.  09/05/2015 TLH/BSO Melanie Browning in [facility] for abnormal PAP. No residual disease in specimen. PAP 08/2016 LGSIL  PAP 02/09/2017 HSIL Colposcopy 02/2017 VAIN 1 PAP 05/2017 NILM 10/30/2017 ASCUS, HPV 16+ Colposcopy 11/26/2017 VAIN 1 Colposcopy 03/17/2018 No obvious disease  noted (as per note from Melanie. Myra on 09/28/2019)  PAP 03/25/2019 LSIL/HPV+ Colposcopy with biopsy 04/20/2019 VAIN 1  PAP 09/21/2019, patient reports VAIN 1, HPV+ Colposcopy 7/21 normal PAP 10/25/20 HSIL, +HR HPV PAP 10/25/20 HSIL, -HR HPV  PAP 10/25/20 HSIL, +HR HPV Reported no gyn symptoms.   Colposcopy 2/22 negative We discussed options for management including surveillance versus Efudex  treatment.  She was anxious and preferred treatment.  We prescribed Efudex  once a week for 10 weeks and then RTC in 1-2 months for repeat PAP. She completed this course with some interruptions due to vulvar irritation despite skin protectants.   PAP 02/28/21 HSIL, HR HPV- No bleeding or discharge, slight odor.  Repeat Pap 04/18/21- ASC-H; cannot exclude high grade squamous intraepithelial.   Recommended she use Estradiol  cream to improve health of vaginal mucosa prior to repeat colposcopy.    06/06/2021 Biopsy to evaluate abnormal Pap  DIAGNOSIS:  A. VAGINA; BIOPSY:  - MINUTE FRAGMENT OF VAGINAL MUCOSA WITH PARTIALLY DETACHED EPITHELIUM  AND AT LEAST LOW-GRADE SQUAMOUS INTRAEPITHELIAL LESION (VAIN 1, MILD  DYSPLASIA).  - NEGATIVE FOR DEFINITE HIGH-GRADE SQUAMOUS INTRAEPITHELIAL LESION AND MALIGNANCY.   12/05/21 PAP HSIL/HPV+ Colposcopy 3/23 with lesion in left fornix, but path did not show significant lesion.   VAGINAL FORNIX, LEFT; BIOPSY:  - 1 FRAGMENT OF ATYPICAL SQUAMOUS MUCOSA ASSOCIATED WITH POSSIBLE  PREVIOUS BIOPSY SITE REACTION.  - SEPARATE FRAGMENT OF ATROPHIC VAGINAL MUCOSA.  05/08/2022 HGSIL vaginal pap; HRHPV positive. She using the estrogen vaginal therapy twice a week since recommended in August.  Follow up exam, PAP, and colposcopy vaginal biopsy 12/23 showed LSIL/VAIN1.   03/19/23 PAP NILM, HPV negative.  10/15/2023  Pap LSIL, HPV positive 04/21/2024 PAP ASCUS, HPV negative.   Other pertinent medical issues She had stent placed a few days ago in her leg due to artery narrowing.  On  Plavix  and ASA.  Small blood blister below right groin where vascular access was obtained. Bruises easily, but no bleeding.   She had colonoscopy for follow up of Lymphocytic colitis and that was stable.   Also having problems with intermittent claudication in left leg and underwent vascular percutaneous procedures including a popliteal stent in 12/21.  She is taking Plavix  and ASA.   10/24 Fem-pop bypass on left leg, but that failed and she had more surgery with stents placed.  Takes Eliquis  and Brilinta  bid in view of prior thromboses.   01/21/2024 Multiple angioplasties  Problem List: Patient Active Problem List   Diagnosis Date Noted   Basal cell carcinoma (BCC) 05/31/2024   Carotid stenosis 05/31/2024   Arthritis of carpometacarpal [facility]) joint of left thumb 03/24/2024   Intestinal occlusion (HCC) 01/29/2024   Elevated troponin 01/27/2024   Blood loss anemia 01/22/2024   Atrial fibrillation (HCC) 01/21/2024   Hypotension 01/21/2024   Non-ST elevation (NSTEMI) myocardial infarction (HCC) 01/21/2024   Atherosclerosis of artery of extremity with rest pain (HCC) 07/16/2023   Macrocytic anemia 01/03/2023   Transaminitis 01/03/2023   Alcohol  use 01/03/2023   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   Muscle spasms of neck 04/06/2022   Cervical spondylosis 04/06/2022   Other muscle spasm 04/06/2022   Acute pain of left wrist 02/14/2022   Atherosclerosis of native arteries of extremity with rest pain (HCC) 11/20/2021   CAD (coronary artery disease) 12/10/2020   Coronary artery disease involving native coronary artery of native heart 11/30/2020   S/P drug eluting coronary stent placement 11/15/2020   Abdominal pain, LLQ (left lower quadrant) 09/28/2020   Long term current use of anticoagulant therapy 09/28/2020   Functional diarrhea 09/28/2020   Chronic venous insufficiency 04/23/2020   PAD (peripheral artery disease) 04/19/2020   Hyperlipidemia 04/19/2020   Hypothyroidism  04/19/2020   Popliteal artery injury, left, 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   Disruption of external operation (surgical) wound, not elsewhere classified, initial encounter 11/09/2019   Atherosclerosis of native artery of left lower extremity with intermittent claudication 10/29/2019   Stricture, artery 10/29/2019   Critical lower limb ischemia (HCC) 10/13/2019   Hypothyroidism due to acquired atrophy of thyroid  04/07/2019   Syncope and collapse 04/07/2019   Senile osteoporosis 09/20/2016   Sleep disorder 07/18/2016   IGT (impaired glucose tolerance) 06/26/2015   Pap smear abnormality of cervix with ASCUS favoring dysplasia 06/26/2015   Family history of Guillain-Barre syndrome 04/26/2015   History of herpes genitalis 04/26/2015   History of HPV infection 04/26/2015   Dyslipidemia 04/17/2015   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   PMR (polymyalgia rheumatica) 04/17/2015   Arthritis of shoulder region, 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   Past Medical History: Past Medical History:  Diagnosis Date   Aortic atherosclerosis    Aortic stenosis 06/26/2023   a.) TTE 06/26/2023: mild-mod AS (MPG 18.2 mmHg; AVA 0.73 cm2)   Arthritis    Atherosclerosis of native artery of left lower extremity with intermittent claudication    a.) multiple vascular interventions (PTA, stenting, arthrectomy, and thrombectomy) of LEFT SFA, LEFT popliteal, LEFT posterior tibial, and LEFT anterior tibial arteries due to critical limb ischemia   CAD (coronary artery disease) 11/09/2020   a.) cCTA 11/09/2020: Ca2+ 539 (83rd %'ile for age/sex/race  match control); <25% dRCA & pLCx, >70% pLAD; b.) LHC/PCI 11/15/2020: 90% pLAD (2.75 x 18 mm Resolute Onyx DES); c.) MV 06/26/2023: no sig ischemia   Cervical spondylosis    Colitis    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)    a.) s/p multiple vascular interventions   Diastolic dysfunction 06/26/2023   a.) TTE 06/26/2023: EF  60-65%, no RWMAs, G2DD, sev AoV thickening with mild AR, mild-mod AS (MPG 18.2 mmHg; AVA 0.73 cm2)   [place]   Dyslipidemia    History of cervical cancer    a.) s/p TAH   HPV (human papilloma virus) infection    Hyperlipidemia    Hypothyroidism    IGT (impaired glucose tolerance)    Left upper lobe pulmonary nodule    Long term current use of aspirin     Long term current use of ticagrelor  therapy    Macrocytic anemia    Osteoporosis    a.) Tx'd with zolodronic acid (Reclast) infusions   PAD (peripheral artery disease)    Polymyalgia rheumatica    Skin cancer of nose    Transaminitis    Vaginal dysplasia    Past Surgical History: Past Surgical History:  Procedure Laterality Date   COLONOSCOPY WITH PROPOFOL  N/A 10/04/2020   Procedure: COLONOSCOPY WITH PROPOFOL ;  Surgeon: Toledo, Ladell POUR, MD;  Location: [facility] ENDOSCOPY;  Service: Gastroenterology;  Laterality: N/A;   COLPOSCOPY     CORONARY STENT INTERVENTION N/A 11/15/2020   Procedure: CORONARY STENT INTERVENTION;  Surgeon: Florencio Cara BIRCH, MD;  Location: [facility] INVASIVE CV LAB;  Service: Cardiovascular;  Laterality: N/A;   FEMORAL-POPLITEAL BYPASS GRAFT Left 07/16/2023   Procedure: BYPASS GRAFT FEMORAL-POPLITEAL ARTERY (FEMORAL-POSTERIOR TIBIAL BYPASS W/ SAPHENOUS VEIN- [id number]);  Surgeon: Jama Cordella MATSU, MD;  Location: [facility] ORS;  Service: Vascular;  Laterality: Left;   KNEE ARTHROSCOPY Left    LAPAROSCOPIC HYSTERECTOMY     LEFT HEART CATH AND CORONARY ANGIOGRAPHY N/A 11/15/2020   Procedure: LEFT HEART CATH AND CORONARY ANGIOGRAPHY;  Surgeon: Florencio Cara BIRCH, MD;  Location: [facility] CV LAB;  Service: Cardiovascular;  Laterality: N/A;   LOWER EXTREMITY ANGIOGRAPHY Left 09/05/2020   Procedure: LOWER EXTREMITY ANGIOGRAPHY;  Surgeon: Jama Cordella MATSU, MD;  Location: [facility] CV [facility];  Service: Cardiovascular;  Laterality: Left;   LOWER EXTREMITY ANGIOGRAPHY Left 04/10/2021    Procedure: LOWER EXTREMITY ANGIOGRAPHY;  Surgeon: Jama Cordella MATSU, MD;  Location: [facility];  Service: Cardiovascular;  Laterality: Left;   LOWER EXTREMITY ANGIOGRAPHY Left 11/20/2021   Procedure: Lower Extremity Angiography;  Surgeon: Jama Cordella MATSU, MD;  Location: [facility];  Service: Cardiovascular;  Laterality: Left;   LOWER EXTREMITY ANGIOGRAPHY Left 06/05/2022   Procedure: Lower Extremity Angiography;  Surgeon: Jama Cordella MATSU, MD;  Location: [facility];  Service: Cardiovascular;  Laterality: Left;   LOWER EXTREMITY ANGIOGRAPHY Left 07/23/2022   Procedure: Lower Extremity Angiography;  Surgeon: Jama Cordella MATSU, MD;  Location: [facility];  Service: Cardiovascular;  Laterality: Left;   LOWER EXTREMITY ANGIOGRAPHY Left 12/27/2022   Procedure: Lower Extremity Angiography;  Surgeon: Jama Cordella MATSU, MD;  Location: [facility];  Service: Cardiovascular;  Laterality: Left;   LOWER EXTREMITY ANGIOGRAPHY Left 06/17/2023   Procedure: Lower Extremity Angiography;  Surgeon: Jama Cordella MATSU, MD;  Location: [facility];  Service: Cardiovascular;  Laterality: Left;   LOWER EXTREMITY ANGIOGRAPHY Left 01/20/2024   Procedure: Lower  Extremity Angiography;  Surgeon: Jama Cordella MATSU, MD;  Location[facility] INVASIVE CV LAB;  Service: Cardiovascular;  Laterality: Left;   LOWER EXTREMITY ANGIOGRAPHY Left 01/21/2024   Procedure: Lower Extremity Angiography;  Surgeon: Jama Cordella MATSU, MD;  Location: [facility];  Service: Cardiovascular;  Laterality: Left;   LOWER EXTREMITY ANGIOGRAPHY Left 08/11/2024   Procedure: Lower Extremity Angiography;  Surgeon: Jama Cordella MATSU, MD;  Location: [facility];  Service: Cardiovascular;  Laterality: Left;   LOWER EXTREMITY INTERVENTION Left 01/20/2024   Procedure: LOWER EXTREMITY INTERVENTION;  Surgeon: Jama Cordella MATSU, MD;  Location: [facility] INVAS[facility] CV LAB;  Service: Cardiovascular;   Laterality: Left;   TOTAL ABDOMINAL HYSTERECTOMY     Past Gynecologic History:  Menarche: 14 Menstrual details: Lasts 3 days Menses regular: yes Last Menstrual Period: Unknown History of OCP/HRT use: No History of Abnormal pap: yes, as per HPI Last pap: 08/2019 History of STDs: The patient reports a past history of: herpes. Sexually active: not asked  OB History:  OB History  No obstetric history on file.   Family History: Family History  Problem Relation Age of Onset   Cancer Mother    Pancreatic cancer Mother    Heart failure Mother    Heart attack Father 31   Heart disease Father    Social History: Social History   Socioeconomic History   Marital status: Married    Spouse name: Norm    Number of children: 1   Years of education: Not on file   Highest education level: Not on file  Occupational History   Occupation: retired   Tobacco Use   Smoking status: Never   Smokeless tobacco: Never  Vaping Use   Vaping status: Never Used  Substance and Sexual Activity   Alcohol  use: Yes    Alcohol /week: 1.0 standard drink of alcohol     Types: 1 Glasses of wine per week    Comment: 5   Drug use: Never   Sexual activity: Yes  Other Topics Concern   Not on file  Social History Narrative   Lives with spouse at [facility]    Social Drivers of Health   Tobacco Use: Low Risk (10/27/2024)   Patient History    Smoking Tobacco Use: Never    Smokeless Tobacco Use: Never    Passive Exposure: Not on file  Financial Resource Strain: Low Risk  (03/24/2024)   Received from [facility]   Overall Financial Resource Strain (CARDIA)    Difficulty of Paying Living Expenses: Not hard at all  Food Insecurity: No Food Insecurity (08/11/2024)   Epic    Worried About Radiation Protection Practitioner of Food in the Last Year: Never true    Ran Out of Food in the Last Year: Never true  Transportation Needs: No Transportation Needs (08/11/2024)   Epic    Lack of Transportation (Medical):  No    Lack of Transportation (Non-Medical): No  Physical Activity: Not on file  Stress: Not on file  Social Connections: Moderately Integrated (08/11/2024)   Social Connection and Isolation Panel    Frequency of Communication with Friends and Family: More than three times a week    Frequency of Social Gatherings with Friends and Family: Not on file    Attends Religious Services: More than 4 times per year    Active Member of [employer] or Organizations: No    Attends Banker Meetings: Never    Marital Status: Married  Catering Manager Violence: Not  At Risk (08/11/2024)   Epic    Fear of Current or Ex-Partner: No    Emotionally Abused: No    Physically Abused: No    Sexually Abused: No  Depression (PHQ2-9): Low Risk (01/03/2023)   Depression (PHQ2-9)    PHQ-2 Score: 0  Alcohol  Screen: Not on file  Housing: Low Risk (08/11/2024)   Epic    Unable to Pay for Housing in the Last Year: No    Number of Times Moved in the Last Year: 0    Homeless in the Last Year: No  Utilities: Not At Risk (08/11/2024)   Epic    Threatened with loss of utilities: No  Health Literacy: Not on file    Allergies: Allergies  Allergen Reactions   Lactose Other (See Comments) and Nausea And Vomiting    Gi upset and diarrhea  Other reaction(s): Other (See Comments)  Gi upset and diarrhea  Other reaction(s): Other (See Comments) Gi upset and diarrhea Gi upset and diarrhea Gi upset and diarrhea    Gi upset and diarrhea    Gi upset and diarrhea Gi upset and diarrhea Gi upset and diarrhea  Gi upset and diarrhea    Gi upset and diarrhea  Other reaction(s): Other (See Comments) Gi upset and diarrhea Gi upset and diarrhea Gi upset and diarrhea  Gi upset and diarrhea  Gi upset and diarrhea Gi upset and diarrhea Gi upset and diarrhea    Current Medications: Current Outpatient Medications  Medication Sig Dispense Refill   amiodarone  (PACERONE ) 200 MG tablet Take 1 tablet (200 mg total) by mouth  daily. 90 tablet 2   apixaban  (ELIQUIS ) 5 MG TABS tablet Take 1 tablet (5 mg total) by mouth 2 (two) times daily. 180 tablet 3   ascorbic acid  (VITAMIN C ) 1000 MG tablet Take 2,000-3,000 mg by mouth as needed.     B Complex Vitamins (VITAMIN B-COMPLEX) TABS Take 1 tablet by mouth daily.     budesonide  (ENTOCORT EC ) 3 MG 24 hr capsule Take 3 capsules (9 mg total) by mouth every morning. LAST REFILL MUST SCHEDULE VISIT 270 capsule 0   Cholecalciferol  125 MCG (5000 UT) capsule Take 5,000 Units by mouth.     clopidogrel  (PLAVIX ) 75 MG tablet Take 1 tablet (75 mg total) by mouth daily. 90 tablet 3   diphenoxylate -atropine  (LOMOTIL ) 2.5-0.025 MG tablet TAKE 1 TABLET BY MOUTH 3 TIMES DAILY AS NEEDED FOR DIARRHEA. 90 tablet 0   estradiol  (ESTRACE ) 0.1 MG/GM vaginal cream Insert 1/4 applicator full into vagina at night twice a week 42.5 g 2   HYDROcodone -acetaminophen  (NORCO) 10-325 MG tablet Take 1 tablet by mouth as needed.     levothyroxine  (SYNTHROID ) 75 MCG tablet Take 75 mcg by mouth daily before breakfast.      losartan  (COZAAR ) 25 MG tablet Take 25 mg by mouth daily.     niacin  (VITAMIN B3) 500 MG tablet Take 500 mg by mouth at bedtime.     vitamin E 180 MG (400 UNITS) capsule Take 400 Units by mouth daily. Patient reported 134 mg daily     losartan  (COZAAR ) 25 MG tablet Take 1 tablet by mouth daily. (Patient not taking: Reported on 10/14/2024)     rosuvastatin  (CRESTOR ) 5 MG tablet Take 1 tablet (5 mg total) by mouth daily. (Patient not taking: Reported on 10/14/2024) 90 tablet 1   No current facility-administered medications for this visit.    Review of Systems General: no complaints  HEENT: no complaints  Lungs: no  complaints  Cardiac: no complaints  GI: no complaints  GU: no complaints  Musculoskeletal: no complaints  Extremities: no complaints  Skin: no complaints  Neuro: no complaints  Endocrine: no complaints  Psych: no complaints        Objective:  Physical Examination:   BP 118/68   Pulse (!) 59   Temp 98.7 F (37.1 C)   Resp 20   Wt 132 lb 9.6 oz (60.1 kg)   SpO2 100%   BMI 20.16 kg/m    ECOG Performance Status: 0 - Asymptomatic  GENERAL: Patient is a well appearing female in no acute distress HEENT: Atraumatic normocephalic NODES:  No inguinal lymphadenopathy palpated.  ABDOMEN:  Soft, nontender, nondistended.   EXTREMITIES:  No peripheral edema.   NEURO:  Nonfocal. Well oriented.  Appropriate affect.  Pelvic: Noted by NP EGBUS: no lesions Cervix: Surgically absent  Vagina: Atrophic and shortened with prominent dimples in fornices.  Pap smear was obtained. No lesions noted.  There was no discharge or active bleeding. On palpation there seems to be extension of the vagina that is open one FB Uterus: Surgically absent BME: no palpable masses  Assessment:  Melanie Browning is a 84 y.o. female s/p TAH-BSO 09/05/2015 for dysplasia but no dysplasia in specimen.  Low grade PAP abnormalities since then with colposcopy negative.  PAP 12/20 VAIN1 HPV+.  Moved to this area and first seen 7/21 with normal colposcopy.  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  Colposcopy of vagina 2/22 with no lesions seen.   No symptoms. We discussed options for management including surveillance versus Efudex  treatment.  She is anxious.and preferred treatment.  She used  Efudex  once a week for 10 weeks with some interruption for vulvar irritation. PAP 02/28/21 HSIL, HR HPV-. Confirmed on [facility] review.  04/18/21- ASC-H; cannot exclude high grade squamous intraepithelial. On colposcopy 06/06/2021 with Lugol's small area of non staining biopsied, VAIN1. PAP 3/23 showed HSIL/HPV+.  On colposcopy an area with punctation seen in the left fornix.  Biopsy left fornix 12/26/21 atypia only.  05/08/2022 HGSIL vaginal pap; HRHPV positive.  Colposcopy showed VAIN1.  PAP 6/24 NILM, HPV negative.  No new symptoms.  Using vaginal estrogen prior to visits  No complaints today.  10/15/2023 Pap LSIL, HPV positive 04/21/2024  PAP ASCUS, HPV negative.   Vaginal atrophy.  Peripheral vascular disease in left leg, underwent recent stent placement after failure of bypass.  Medical co-morbidities complicating care: PAD, Dyslipidemia  Plan:   Problem List Items Addressed This Visit       Other   History of HPV infection - Primary   Relevant Orders   IGP, Aptima HPV   Other Visit Diagnoses       Atypical squamous cell changes of undetermined significance (ASCUS) on vaginal cytology           PAP/HPV done.    With regard to vaginal estrogen I think it is reasonable to hold and only restart if we are planning colposcopy or having issues with visualization given her clotting history. She likes this plan.   If surgery for vaginectomy recommended in the future, may ask Melanie Dorice to do this, as UroGyn much more comfortable with this dissection and avoidance of the bladder.  Plan for return in 6 months, but if Pap normal and HPV negative can consider extending to 12 months. Rayfield Jasmine, RN will discuss with Melanie. [place].   The patient's diagnosis, an outline of the further diagnostic and laboratory studies which will be required, the  recommendation, and alternatives were discussed.  All questions were answered to the patient's satisfaction.   Maeryn Mcgath Isidor Constable, MD  "

## 2024-11-02 LAB — IGP, APTIMA HPV: HPV Aptima: POSITIVE — AB

## 2024-11-09 ENCOUNTER — Ambulatory Visit: Admitting: Cardiovascular Disease

## 2024-11-18 ENCOUNTER — Ambulatory Visit (INDEPENDENT_AMBULATORY_CARE_PROVIDER_SITE_OTHER): Admitting: Vascular Surgery

## 2024-11-18 ENCOUNTER — Encounter (INDEPENDENT_AMBULATORY_CARE_PROVIDER_SITE_OTHER)

## 2025-04-27 ENCOUNTER — Inpatient Hospital Stay

## 2025-10-26 ENCOUNTER — Inpatient Hospital Stay
